# Patient Record
Sex: Female | Born: 1965
Health system: Southern US, Community
[De-identification: ages and names within clinical notes are randomized; demographics above are authoritative.]

## PROBLEM LIST (undated history)

## (undated) ENCOUNTER — Emergency Department (HOSPITAL_BASED_OUTPATIENT_CLINIC_OR_DEPARTMENT_OTHER): Admission: EM | Payer: Worker's Compensation | Source: Home / Self Care

## (undated) DIAGNOSIS — K5792 Diverticulitis of intestine, part unspecified, without perforation or abscess without bleeding: Secondary | ICD-10-CM

## (undated) DIAGNOSIS — E079 Disorder of thyroid, unspecified: Secondary | ICD-10-CM

## (undated) DIAGNOSIS — J189 Pneumonia, unspecified organism: Secondary | ICD-10-CM

## (undated) DIAGNOSIS — I10 Essential (primary) hypertension: Secondary | ICD-10-CM

## (undated) DIAGNOSIS — R7303 Prediabetes: Secondary | ICD-10-CM

## (undated) DIAGNOSIS — F419 Anxiety disorder, unspecified: Secondary | ICD-10-CM

## (undated) DIAGNOSIS — E039 Hypothyroidism, unspecified: Secondary | ICD-10-CM

## (undated) DIAGNOSIS — D649 Anemia, unspecified: Secondary | ICD-10-CM

## (undated) DIAGNOSIS — F1020 Alcohol dependence, uncomplicated: Secondary | ICD-10-CM

## (undated) DIAGNOSIS — B20 Human immunodeficiency virus [HIV] disease: Secondary | ICD-10-CM

## (undated) DIAGNOSIS — I219 Acute myocardial infarction, unspecified: Secondary | ICD-10-CM

## (undated) DIAGNOSIS — E785 Hyperlipidemia, unspecified: Secondary | ICD-10-CM

## (undated) DIAGNOSIS — T7840XA Allergy, unspecified, initial encounter: Secondary | ICD-10-CM

## (undated) DIAGNOSIS — E119 Type 2 diabetes mellitus without complications: Secondary | ICD-10-CM

## (undated) DIAGNOSIS — K5790 Diverticulosis of intestine, part unspecified, without perforation or abscess without bleeding: Secondary | ICD-10-CM

## (undated) DIAGNOSIS — K7010 Alcoholic hepatitis without ascites: Secondary | ICD-10-CM

## (undated) DIAGNOSIS — Z21 Asymptomatic human immunodeficiency virus [HIV] infection status: Secondary | ICD-10-CM

## (undated) DIAGNOSIS — K449 Diaphragmatic hernia without obstruction or gangrene: Secondary | ICD-10-CM

## (undated) DIAGNOSIS — G709 Myoneural disorder, unspecified: Secondary | ICD-10-CM

## (undated) DIAGNOSIS — Z8719 Personal history of other diseases of the digestive system: Secondary | ICD-10-CM

## (undated) DIAGNOSIS — R569 Unspecified convulsions: Secondary | ICD-10-CM

## (undated) DIAGNOSIS — K635 Polyp of colon: Secondary | ICD-10-CM

## (undated) DIAGNOSIS — I639 Cerebral infarction, unspecified: Secondary | ICD-10-CM

## (undated) DIAGNOSIS — F329 Major depressive disorder, single episode, unspecified: Secondary | ICD-10-CM

## (undated) DIAGNOSIS — B1920 Unspecified viral hepatitis C without hepatic coma: Secondary | ICD-10-CM

## (undated) DIAGNOSIS — F319 Bipolar disorder, unspecified: Secondary | ICD-10-CM

## (undated) DIAGNOSIS — M1990 Unspecified osteoarthritis, unspecified site: Secondary | ICD-10-CM

## (undated) DIAGNOSIS — F32A Depression, unspecified: Secondary | ICD-10-CM

## (undated) HISTORY — DX: Alcohol dependence, uncomplicated: F10.20

## (undated) HISTORY — DX: Type 2 diabetes mellitus without complications: E11.9

## (undated) HISTORY — DX: Anemia, unspecified: D64.9

## (undated) HISTORY — DX: Polyp of colon: K63.5

## (undated) HISTORY — DX: Allergy, unspecified, initial encounter: T78.40XA

## (undated) HISTORY — DX: Anxiety disorder, unspecified: F41.9

## (undated) HISTORY — DX: Diaphragmatic hernia without obstruction or gangrene: K44.9

## (undated) HISTORY — PX: ABDOMINAL HYSTERECTOMY: SHX81

## (undated) HISTORY — DX: Unspecified osteoarthritis, unspecified site: M19.90

## (undated) HISTORY — DX: Acute myocardial infarction, unspecified: I21.9

## (undated) HISTORY — DX: Hyperlipidemia, unspecified: E78.5

## (undated) HISTORY — DX: Pneumonia, unspecified organism: J18.9

## (undated) HISTORY — DX: Asymptomatic human immunodeficiency virus (hiv) infection status: Z21

## (undated) HISTORY — DX: Diverticulitis of intestine, part unspecified, without perforation or abscess without bleeding: K57.92

## (undated) HISTORY — PX: OTHER SURGICAL HISTORY: SHX169

## (undated) HISTORY — DX: Cerebral infarction, unspecified: I63.9

## (undated) HISTORY — DX: Diverticulosis of intestine, part unspecified, without perforation or abscess without bleeding: K57.90

## (undated) HISTORY — DX: Hypothyroidism, unspecified: E03.9

## (undated) HISTORY — DX: Human immunodeficiency virus (HIV) disease: B20

## (undated) HISTORY — PX: CHOLECYSTECTOMY: SHX55

---

## 2004-07-16 HISTORY — PX: TOTAL ABDOMINAL HYSTERECTOMY W/ BILATERAL SALPINGOOPHORECTOMY: SHX83

## 2008-01-02 ENCOUNTER — Emergency Department (HOSPITAL_COMMUNITY): Admission: EM | Admit: 2008-01-02 | Discharge: 2008-01-02 | Payer: Self-pay | Admitting: Emergency Medicine

## 2008-05-01 ENCOUNTER — Emergency Department (HOSPITAL_COMMUNITY): Admission: EM | Admit: 2008-05-01 | Discharge: 2008-05-02 | Payer: Self-pay | Admitting: Emergency Medicine

## 2008-12-19 ENCOUNTER — Emergency Department (HOSPITAL_COMMUNITY): Admission: EM | Admit: 2008-12-19 | Discharge: 2008-12-20 | Payer: Self-pay | Admitting: Emergency Medicine

## 2009-02-22 ENCOUNTER — Emergency Department (HOSPITAL_COMMUNITY): Admission: EM | Admit: 2009-02-22 | Discharge: 2009-02-22 | Payer: Self-pay | Admitting: Emergency Medicine

## 2009-05-08 ENCOUNTER — Emergency Department (HOSPITAL_COMMUNITY): Admission: EM | Admit: 2009-05-08 | Discharge: 2009-05-08 | Payer: Self-pay | Admitting: Emergency Medicine

## 2009-05-22 ENCOUNTER — Emergency Department (HOSPITAL_COMMUNITY): Admission: EM | Admit: 2009-05-22 | Discharge: 2009-05-22 | Payer: Self-pay | Admitting: Emergency Medicine

## 2009-08-19 ENCOUNTER — Telehealth: Payer: Self-pay | Admitting: Family Medicine

## 2009-08-19 ENCOUNTER — Ambulatory Visit: Payer: Self-pay | Admitting: Family Medicine

## 2009-08-19 DIAGNOSIS — M79605 Pain in left leg: Secondary | ICD-10-CM | POA: Insufficient documentation

## 2009-08-19 DIAGNOSIS — M79604 Pain in right leg: Secondary | ICD-10-CM | POA: Insufficient documentation

## 2009-08-19 DIAGNOSIS — I10 Essential (primary) hypertension: Secondary | ICD-10-CM | POA: Insufficient documentation

## 2009-08-19 DIAGNOSIS — M545 Low back pain: Secondary | ICD-10-CM

## 2009-09-12 ENCOUNTER — Ambulatory Visit: Payer: Self-pay | Admitting: Family Medicine

## 2009-09-12 ENCOUNTER — Encounter: Payer: Self-pay | Admitting: Family Medicine

## 2009-09-13 ENCOUNTER — Encounter: Payer: Self-pay | Admitting: Family Medicine

## 2009-09-13 ENCOUNTER — Observation Stay (HOSPITAL_COMMUNITY): Admission: EM | Admit: 2009-09-13 | Discharge: 2009-09-16 | Payer: Self-pay | Admitting: Emergency Medicine

## 2009-09-13 ENCOUNTER — Ambulatory Visit: Payer: Self-pay | Admitting: Family Medicine

## 2009-09-13 LAB — CONVERTED CEMR LAB
ALT: 73 units/L — ABNORMAL HIGH (ref 0–35)
AST: 95 units/L — ABNORMAL HIGH (ref 0–37)
Albumin: 3.7 g/dL (ref 3.5–5.2)
Alkaline Phosphatase: 70 units/L (ref 39–117)
Barbiturate Quant, Ur: NEGATIVE
Calcium: 9 mg/dL (ref 8.4–10.5)
Creatinine, Ser: 0.82 mg/dL (ref 0.40–1.20)
Creatinine,U: 106.3 mg/dL
Ethyl Alcohol: <10 mg/dL (ref <10)
HCT: 36.5 % (ref 36.0–46.0)
Methadone: NEGATIVE
Phenytoin Lvl: <0.5 ug/mL — ABNORMAL LOW (ref 10.0–20.0)
Propoxyphene: POSITIVE — AB
RBC: 3.88 M/uL (ref 3.87–5.11)
Sodium: 137 meq/L (ref 135–145)
Total Bilirubin: 0.6 mg/dL (ref 0.3–1.2)
Total Protein: 7.5 g/dL (ref 6.0–8.3)
Valproic Acid Lvl: <1 ug/mL — ABNORMAL LOW (ref 50.0–100.0)
WBC: 6.2 10*3/uL (ref 4.0–10.5)

## 2009-09-16 ENCOUNTER — Encounter: Payer: Self-pay | Admitting: *Deleted

## 2009-09-23 ENCOUNTER — Ambulatory Visit: Payer: Self-pay | Admitting: Family Medicine

## 2009-09-23 DIAGNOSIS — F1910 Other psychoactive substance abuse, uncomplicated: Secondary | ICD-10-CM | POA: Insufficient documentation

## 2009-09-23 DIAGNOSIS — F319 Bipolar disorder, unspecified: Secondary | ICD-10-CM | POA: Insufficient documentation

## 2009-09-27 ENCOUNTER — Telehealth: Payer: Self-pay | Admitting: Family Medicine

## 2009-10-05 ENCOUNTER — Telehealth: Payer: Self-pay | Admitting: Family Medicine

## 2009-10-07 ENCOUNTER — Telehealth: Payer: Self-pay | Admitting: Family Medicine

## 2010-02-09 ENCOUNTER — Emergency Department (HOSPITAL_BASED_OUTPATIENT_CLINIC_OR_DEPARTMENT_OTHER): Admission: EM | Admit: 2010-02-09 | Discharge: 2010-02-09 | Payer: Self-pay | Admitting: Emergency Medicine

## 2010-02-14 ENCOUNTER — Encounter: Payer: Self-pay | Admitting: Family Medicine

## 2010-02-14 ENCOUNTER — Telehealth: Payer: Self-pay | Admitting: Family Medicine

## 2010-02-14 ENCOUNTER — Ambulatory Visit: Payer: Self-pay | Admitting: Family Medicine

## 2010-02-14 LAB — CONVERTED CEMR LAB
BUN: 9 mg/dL (ref 6–23)
Creatinine, Ser: 0.84 mg/dL (ref 0.40–1.20)
Sodium: 141 meq/L (ref 135–145)

## 2010-02-15 ENCOUNTER — Emergency Department (HOSPITAL_COMMUNITY): Admission: EM | Admit: 2010-02-15 | Discharge: 2010-02-15 | Payer: Self-pay | Admitting: Emergency Medicine

## 2010-02-15 ENCOUNTER — Encounter: Payer: Self-pay | Admitting: Family Medicine

## 2010-03-10 ENCOUNTER — Encounter: Payer: Self-pay | Admitting: Family Medicine

## 2010-03-10 ENCOUNTER — Ambulatory Visit: Payer: Self-pay | Admitting: Family Medicine

## 2010-03-10 LAB — CONVERTED CEMR LAB: Whiff Test: POSITIVE

## 2010-03-13 ENCOUNTER — Telehealth: Payer: Self-pay | Admitting: Family Medicine

## 2010-03-16 ENCOUNTER — Encounter: Payer: Self-pay | Admitting: Family Medicine

## 2010-03-16 DIAGNOSIS — Z21 Asymptomatic human immunodeficiency virus [HIV] infection status: Secondary | ICD-10-CM | POA: Insufficient documentation

## 2010-03-16 LAB — CONVERTED CEMR LAB
HIV-2 Ab: NEGATIVE
HIV: REACTIVE
Valproic Acid Lvl: 42.5 ug/mL — ABNORMAL LOW (ref 50.0–100.0)

## 2010-03-17 ENCOUNTER — Ambulatory Visit: Payer: Self-pay | Admitting: Family Medicine

## 2010-03-22 ENCOUNTER — Encounter: Payer: Self-pay | Admitting: *Deleted

## 2010-03-28 ENCOUNTER — Ambulatory Visit: Payer: Self-pay | Admitting: Internal Medicine

## 2010-03-28 LAB — CONVERTED CEMR LAB
Albumin: 4 g/dL (ref 3.5–5.2)
Alkaline Phosphatase: 57 units/L (ref 39–117)
BUN: 16 mg/dL (ref 6–23)
Bilirubin Urine: NEGATIVE
Calcium: 9.1 mg/dL (ref 8.4–10.5)
Chloride: 103 meq/L (ref 96–112)
HCT: 33.5 % — ABNORMAL LOW (ref 36.0–46.0)
HIV 1 RNA Quant: 5500 copies/mL — ABNORMAL HIGH (ref <20)
HIV-1 RNA Quant, Log: 3.74 — ABNORMAL HIGH (ref <1.30)
Hemoglobin: 11 g/dL — ABNORMAL LOW (ref 12.0–15.0)
Hep A Total Ab: NEGATIVE
Hep B Core Total Ab: NEGATIVE
Hep B S Ab: NEGATIVE
Ketones, ur: NEGATIVE mg/dL
LDL Cholesterol: 74 mg/dL (ref 0–99)
Leukocytes, UA: NEGATIVE
Lymphs Abs: 2.9 10*3/uL (ref 0.7–4.0)
MCHC: 32.8 g/dL (ref 30.0–36.0)
MCV: 94.9 fL (ref 78.0–100.0)
Monocytes Absolute: 0.6 10*3/uL (ref 0.1–1.0)
Monocytes Relative: 11 % (ref 3–12)
Neutrophils Relative %: 37 % — ABNORMAL LOW (ref 43–77)
Protein, ur: NEGATIVE mg/dL
RBC: 3.53 M/uL — ABNORMAL LOW (ref 3.87–5.11)
Sodium: 138 meq/L (ref 135–145)
Total Bilirubin: 0.3 mg/dL (ref 0.3–1.2)
Total CHOL/HDL Ratio: 5.7
Urine Glucose: NEGATIVE mg/dL
WBC: 5.9 10*3/uL (ref 4.0–10.5)

## 2010-03-30 ENCOUNTER — Encounter (INDEPENDENT_AMBULATORY_CARE_PROVIDER_SITE_OTHER): Payer: Self-pay | Admitting: *Deleted

## 2010-04-04 ENCOUNTER — Emergency Department (HOSPITAL_COMMUNITY): Admission: EM | Admit: 2010-04-04 | Discharge: 2010-04-05 | Payer: Self-pay | Admitting: Emergency Medicine

## 2010-04-20 ENCOUNTER — Ambulatory Visit: Payer: Self-pay | Admitting: Internal Medicine

## 2010-04-20 DIAGNOSIS — M79609 Pain in unspecified limb: Secondary | ICD-10-CM | POA: Insufficient documentation

## 2010-04-28 ENCOUNTER — Telehealth (INDEPENDENT_AMBULATORY_CARE_PROVIDER_SITE_OTHER): Payer: Self-pay | Admitting: *Deleted

## 2010-05-11 ENCOUNTER — Emergency Department (HOSPITAL_COMMUNITY): Admission: EM | Admit: 2010-05-11 | Discharge: 2010-05-11 | Payer: Self-pay | Admitting: Emergency Medicine

## 2010-06-27 ENCOUNTER — Ambulatory Visit: Payer: Self-pay | Admitting: Internal Medicine

## 2010-07-25 ENCOUNTER — Encounter (INDEPENDENT_AMBULATORY_CARE_PROVIDER_SITE_OTHER): Payer: Self-pay | Admitting: *Deleted

## 2010-08-06 ENCOUNTER — Encounter: Payer: Self-pay | Admitting: Family Medicine

## 2010-08-10 ENCOUNTER — Ambulatory Visit: Admit: 2010-08-10 | Payer: Self-pay | Admitting: Internal Medicine

## 2010-08-15 ENCOUNTER — Inpatient Hospital Stay (HOSPITAL_COMMUNITY)
Admission: AD | Admit: 2010-08-15 | Discharge: 2010-08-18 | DRG: 885 | Disposition: A | Discharge status: Home / Self Care | Payer: Self-pay | Attending: Psychiatry | Admitting: Psychiatry

## 2010-08-15 ENCOUNTER — Emergency Department (HOSPITAL_COMMUNITY)
Admission: EM | Admit: 2010-08-15 | Discharge: 2010-08-15 | Disposition: A | Discharge status: Other Acute Inpatient Hospital | Payer: Self-pay | Source: Home / Self Care | Admitting: Emergency Medicine

## 2010-08-15 DIAGNOSIS — F1210 Cannabis abuse, uncomplicated: Secondary | ICD-10-CM

## 2010-08-15 DIAGNOSIS — I10 Essential (primary) hypertension: Secondary | ICD-10-CM

## 2010-08-15 DIAGNOSIS — F4310 Post-traumatic stress disorder, unspecified: Secondary | ICD-10-CM

## 2010-08-15 DIAGNOSIS — G40909 Epilepsy, unspecified, not intractable, without status epilepticus: Secondary | ICD-10-CM

## 2010-08-15 DIAGNOSIS — R45851 Suicidal ideations: Secondary | ICD-10-CM

## 2010-08-15 DIAGNOSIS — G43909 Migraine, unspecified, not intractable, without status migrainosus: Secondary | ICD-10-CM

## 2010-08-15 DIAGNOSIS — Z634 Disappearance and death of family member: Secondary | ICD-10-CM

## 2010-08-15 DIAGNOSIS — F332 Major depressive disorder, recurrent severe without psychotic features: Principal | ICD-10-CM

## 2010-08-15 DIAGNOSIS — Z21 Asymptomatic human immunodeficiency virus [HIV] infection status: Secondary | ICD-10-CM

## 2010-08-15 LAB — CBC
HCT: 40.1 % (ref 36.0–46.0)
RBC: 4.32 MIL/uL (ref 3.87–5.11)

## 2010-08-15 LAB — COMPREHENSIVE METABOLIC PANEL
Alkaline Phosphatase: 71 U/L (ref 39–117)
BUN: 6 mg/dL (ref 6–23)
CO2: 25 mEq/L (ref 19–32)
GFR calc Af Amer: >60 mL/min (ref >60)
Glucose, Bld: 84 mg/dL (ref 70–99)
Sodium: 141 mEq/L (ref 135–145)
Total Bilirubin: 0.2 mg/dL — ABNORMAL LOW (ref 0.3–1.2)
Total Protein: 8 g/dL (ref 6.0–8.3)

## 2010-08-15 LAB — RAPID URINE DRUG SCREEN, HOSP PERFORMED
Amphetamines: NOT DETECTED
Benzodiazepines: NOT DETECTED
Cocaine: POSITIVE — AB
Opiates: NOT DETECTED
Tetrahydrocannabinol: POSITIVE — AB

## 2010-08-15 LAB — URINALYSIS, ROUTINE W REFLEX MICROSCOPIC
Nitrite: NEGATIVE
Specific Gravity, Urine: 1.018 (ref 1.005–1.030)
Urobilinogen, UA: 1 mg/dL (ref 0.0–1.0)

## 2010-08-15 LAB — URINE MICROSCOPIC-ADD ON

## 2010-08-15 LAB — DIFFERENTIAL
Eosinophils Absolute: 0.2 10*3/uL (ref 0.0–0.7)
Eosinophils Relative: 2 % (ref 0–5)
Lymphocytes Relative: 46 % (ref 12–46)
Lymphs Abs: 4.6 10*3/uL — ABNORMAL HIGH (ref 0.7–4.0)
Monocytes Absolute: 0.7 10*3/uL (ref 0.1–1.0)
Monocytes Relative: 7 % (ref 3–12)
Neutrophils Relative %: 45 % (ref 43–77)

## 2010-08-15 LAB — VALPROIC ACID LEVEL: Valproic Acid Lvl: <10 ug/mL — ABNORMAL LOW (ref 50.0–100.0)

## 2010-08-15 NOTE — Progress Notes (Signed)
Summary: triage  Phone Note Call from Patient Call back at Home Phone [phone]   Caller: Patient Summary of Call: Just saw Cheryl Thompson and wanted her to know that her bp when she got home 165/102.  Wants to know what it means if the bottom one is risizng. Initial call taken by: Clydell Hakim,  August 19, 2009 10:44 AM  Follow-up for Phone Call        explained bps vary . could take it every 5 minutes & get different numbers each time. her sister has gone to the store to get the bp meds for her. urged low salt diet & discussed where salt is found in processed foods. to call back at any time. told her to bring her bp cuff here at next visit so we can see if they are getting the same readings Follow-up by: [name] RN,  August 19, 2009 10:46 AM

## 2010-08-15 NOTE — Progress Notes (Signed)
Summary: referral for detox  Phone Note Call from Patient Call back at Home Phone [phone]   Caller: Patient Summary of Call: wants a referral to go a detox place Initial call taken by: De Nurse,  October 05, 2009 8:42 AM  Follow-up for Phone Call        Still drinking, took a bunch of ibuprofen last night with 32 ox beer and was upset when she woke up this morning.  Instructed to call the crisis line at [facility], to seek immediate placement.  Admits to drinking and smoking pot daily. Follow-up by: [name] NP,  October 05, 2009 12:41 PM

## 2010-08-15 NOTE — Progress Notes (Signed)
Summary: phn msg/FYI/TS  Phone Note Call from Patient Call back at Home Phone [phone]   Caller: Patient Summary of Call: Pt has not drank or smoked pot in 4 days and taking meds, also going to meeting. Initial call taken by: Clydell Hakim,  October 07, 2009 10:48 AM  Follow-up for Phone Call        FWD. TO S.Ahlayah Tarkowski  Follow-up by: Arlyss Repress CMA,,  October 07, 2009 11:14 AM
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
## 2010-08-15 NOTE — Progress Notes (Signed)
Summary: Rx Prob  Phone Note Call from Patient Call back at [facility] Phone [phone]   Caller: Patient Summary of Call: The rx that [name] gave her on Friday she took to [facility] and thought it was on the 4 dollar plan, but it was not.  She needs a new one called into the health dept.   Initial call taken by: Clydell Hakim,  September 27, 2009 4:22 PM  Follow-up for Phone Call        will forward to [name]. Follow-up by: Theresia Lo RN,  September 27, 2009 4:25 PM    Prescriptions: SERTRALINE HCL 25 MG TABS (SERTRALINE HCL) one daily Brand medically necessary #30 x 6   Entered and Authorized by:   [name] NP   Signed by:   [name] NP on 09/27/2009   Method used:   Faxed to ...       [facility] (retail)       [address] [place], [place]  [address]       Ph: [phone]       Fax: [phone]   RxID:   [id number]

## 2010-08-15 NOTE — Miscellaneous (Signed)
Summary: PT IN HOSPITAL/TS  Clinical Lists Changes
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
## 2010-08-15 NOTE — Progress Notes (Signed)
Summary: Depokote levels  Phone Note Call from Patient   Reason for Call: Talk to Nurse Summary of Call: pt would like her depokote levels to be called into the facility she is staying at, [phone] Initial call taken by: Knox Royalty,  February 14, 2010 11:17 AM  Follow-up for Phone Call        will call tomorrow, after test results. Follow-up by: Arlyss Repress CMA,,  February 14, 2010 11:20 AM  Additional Follow-up for Phone Call Additional follow up Details #1::        depokote level low...fwd. to s.Annaleese Guier Additional Follow-up by: Arlyss Repress CMA,,  February 15, 2010 9:18 AM

## 2010-08-15 NOTE — Miscellaneous (Signed)
Summary: call from state Health Dept  Clinical Lists Changes Cheryl Thompson with [facility] called to make sure pt knew her diagnosis of HIV & had been referred to ID & to confirm her address & phone. states he wil be contacting her to trace where she got the infection. he asked other questions but I told him he will need to speak with pt. Marland KitchenGolden Circle RN  March 22, 2010 11:47 AM

## 2010-08-15 NOTE — Miscellaneous (Signed)
Summary: HIPAA Restrictions  HIPAA Restrictions   Imported By: Florinda Marker 03/28/2010 15:48:57  _____________________________________________________________________  External Attachment:    Type:   Image     Comment:   External Document

## 2010-08-15 NOTE — Miscellaneous (Signed)
Summary: Bridge counselor referral   Clinical Lists Changes  Orders: Added new Referral order of Misc. Referral (Misc. Ref) - Signed

## 2010-08-15 NOTE — Assessment & Plan Note (Signed)
Summary: abd pain/depakote level ck,df   Vital Signs:  Patient profile:   45 year old female Height:      67 inches Weight:      168 pounds BMI:     26.41 Temp:     98.3 degrees F oral BP sitting:   128 / 80  (left arm) Cuff size:   regular  Vitals Entered By: Tessie Fass CMA (March 10, 2010 9:09 AM) Is Patient Diabetic? No Pain Assessment Patient in pain? yes        History of Present Illness: Cheryl Thompson has been sober for 40 days, she is living in a transitional house for women.  She is living day to day.  She is having a deep ache type pain in her legs and body, especially her back.  She has knee pain when she exercises.  She has been spotting vaginally and has had a TAH wtih BSO, so beleives that she has an STD.  She denies seizures, increse in Depakote caused sedation, seen in ER.  Dosage reduced to 250 two times a day, she is here for a level today.  Seizures had been in the past related to alcohol withdrawal. She has been taking her BP meds.    Current Medications (verified): 1)  Depakote 250 Mg Tbec (Divalproex Sodium) .... Two Times A Day (Level At 29.1 On This Dose) 2)  Lisinopril-Hydrochlorothiazide 20-12.5 Mg Tabs (Lisinopril-Hydrochlorothiazide) .... One Daily 3)  Amlodipine Besylate 5 Mg Tabs (Amlodipine Besylate) .... One Daily 4)  Amitriptyline Hcl 50 Mg Tabs (Amitriptyline Hcl) .... Use One To One and 1/2 To 2 At Bedtime 5)  Metronidazole 500 Mg Tabs (Metronidazole) .... 4 Tabs Times One  Allergies (verified): 1)  ! Penicillin 2)  ! Naprosyn  Review of Systems      See HPI General:  Denies fever, loss of appetite, sleep disorder, sweats, and weakness. MS:  Complains of joint pain and low back pain. Psych:  Complains of depression; denies anxiety, panic attacks, and suicidal thoughts/plans.  Physical Exam  General:  Well-developed,well-nourished,in no acute distress; alert,appropriate and cooperative throughout examination Msk:  normal ROM, no joint  tenderness, no joint swelling, and no joint warmth.     Detailed Back/Spine Exam  Lumbosacral Exam:  Inspection-deformity:    Normal Palpation-spinal tenderness:  Normal Range of Motion:    Forward Flexion:   60 degrees    Hyperextension:   25 degrees    Schober's:        >6 cm    Right Lateral Bend:   25 degrees    Left Lateral Bend:   25 degrees Squatting:  normal Lying Straight Leg Raise:    Right:  negative    Left:  negative Sitting Straight Leg Raise:    Right:  negative    Left:  negative Reverse Straight Leg Raise:    Right:  negative    Left:  negative Contralateral Straight Leg Raise:    Right:  negative    Left:  negative Sciatic Notch:    There is no sciatic notch tenderness. 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 Orders: [facility]- Est  Level 4 (96045)  Problem # 2:  BIPOLAR AFFECTIVE DISORDER (ICD-296.80) Doing well, Depoke likely treating.  Will monitor for changes.  She is in outpatient treatment so getting a lot of support at this time. Orders: Valproic Acid-[facility] (40981-19147) [facility]- Est  Level 4 (82956)  Problem # 3:  SUBSTANCE ABUSE, MULTIPLE (ICD-305.90) 40 days sober, in transitional housing Orders: [facility]- Est  Level 4 (99214)  Problem # 4:  BACK PAIN, LUMBAR (ICD-724.2) chronic, add amitriptyline qhs  Problem # 5:  SEIZURE DISORDER (ICD-780.39) Past related to alcohol withdrawal, seizure free Her updated medication list for this problem includes:    Depakote 250 Mg Tbec (Divalproex sodium) .Marland Kitchen..Marland Kitchen Two times a day (level at 29.1 on this dose)  Problem # 6:  CONTACT OR  EXPOSURE TO OTHER VIRAL DISEASES (ICD-V01.79)  Orders: GC/Chlamydia-[facility] (87591/87491) HIV-[facility] (21308-65784) RPR-[facility] (69629-52841)  Complete Medication List: 1)  Depakote 250 Mg Tbec (Divalproex sodium) .... Two times a day (level at 29.1 on this dose) 2)  Lisinopril-hydrochlorothiazide 20-12.5 Mg Tabs (Lisinopril-hydrochlorothiazide) .... One daily 3)  Amlodipine Besylate 5 Mg Tabs (Amlodipine besylate) .... One daily 4)  Amitriptyline Hcl 50 Mg Tabs (Amitriptyline hcl) .... Use one to one and 1/2 to 2 at bedtime 5)  Metronidazole 500 Mg Tabs (Metronidazole) .... 4 tabs times one  [facility] ([id number][facility]  Patient Instructions: 1)  Keep up the good work 2)  Be mindful of the different types of pain you experience  3)  Begin to exercise your legs to develop strength in your legs 4)  Return in one month is pain is not improved 5)  Otherwise return in October for a flu shot 6)  I will call you next week with your results. Prescriptions: METRONIDAZOLE 500 MG TABS (METRONIDAZOLE) 4 tabs times one Brand medically necessary #4 x 1   Entered and Authorized by:   [name] NP   Signed by:   [name] NP on 03/10/2010   Method used:   Print then Give to Patient   RxID:   [id number] AMITRIPTYLINE HCL 50 MG TABS (AMITRIPTYLINE HCL) use one to one and 1/2 to 2 at bedtime Brand medically necessary #60 x 6   Entered and Authorized by:   [name] NP   Signed by:   [name] NP on 03/10/2010   Method used:   Print then Give to Patient   RxID:   [id number] DEPAKOTE 250 MG TBEC (DIVALPROEX SODIUM) two times a day (level at 29.1 on this dose) Brand medically necessary #60 x 11   Entered and Authorized by:   [name] NP   Signed by:   [name] NP on 03/10/2010   Method used:   Print then Give to Patient   RxID:   [id number] AMLODIPINE BESYLATE 5 MG TABS (AMLODIPINE BESYLATE) one daily Brand medically necessary #30 x 11   Entered and Authorized by:    [name] NP   Signed by:   [name] NP on 03/10/2010   Method used:   Print then Give to Patient   RxID:   [id number] LISINOPRIL-HYDROCHLOROTHIAZIDE 20-12.5 MG TABS (LISINOPRIL-HYDROCHLOROTHIAZIDE) one daily Brand medically necessary #30 x 11   Entered and Authorized by:   [name] NP   Signed by:   [name] NP on 03/10/2010   Method used:   Print then Give to  Patient   RxID:   [id number]   Laboratory Results  Date/Time Received: March 10, 2010 9:40 AM  Date/Time Reported: March 10, 2010 9:45 AM   Wet [place] Source: vag WBC/hpf: 10-20 Bacteria/hpf: 3+  Cocci Clue cells/hpf: few  Positive whiff Yeast/hpf: none Trichomonas/hpf: many Comments: mod RBC's ...............test performed by......Marland KitchenBonnie A. Swaziland, MLS (ASCP)cm

## 2010-08-15 NOTE — Miscellaneous (Signed)
Summary: clinical update/[facility] ncadap app completed  Clinical Lists Changes  Observations: Added new observation of INFECTDIS MD: Philipp Deputy (03/30/2010 9:08) Added new observation of RWTITLE: D (03/30/2010 9:08) Added new observation of PAYOR: No Insurance (03/30/2010 9:08) Added new observation of AIDSDAP: Pending (03/30/2010 9:08) Added new observation of PCTFPL: 63.42  (03/30/2010 9:08) Added new observation of INCOMESOURCE: other  (03/30/2010 9:08) Added new observation of HOUSEINCOME: 6868  (03/30/2010 9:08) Added new observation of #CHILD<18 IN: No  (03/30/2010 9:08) Added new observation of FAMILYSIZE: 1  (03/30/2010 9:08) Added new observation of HOUSING: Stable/permanent  (03/30/2010 9:08) Added new observation of FINASSESSDT: 03/28/2010  (03/30/2010 9:08) Added new observation of GENDER: Female  (03/30/2010 9:08) Added new observation of MARITAL STAT: separated  (03/30/2010 9:08) Added new observation of LATINO/HISP: No  (03/30/2010 9:08) Added new observation of RACE: African American  (03/30/2010 9:08) Added new observation of REC_MESSAGE: Yes  (03/30/2010 9:08) Added new observation of RECPHONECALL: Yes  (03/30/2010 9:08) Added new observation of REC_MAIL: Yes  (03/30/2010 9:08) Added new observation of RW VITAL STA: Active  (03/30/2010 9:08) Added new observation of PATNTCOUNTY: [place]  (03/30/2010 9:08) Added new observation of RWPARTICIP: Yes  (03/30/2010 9:08)
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
## 2010-08-15 NOTE — Assessment & Plan Note (Signed)
Summary: new 042 intake/kam              Prevention For Positives: 03/30/2010   Safe sex practices discussed with patient. Condoms offered.        03/28/2010   Patient was screened for substance abuse and depression. Referal was made as indicated.                      Infectious Disease New Patient Intake Referring MD/Agency: Rosalio Macadamia , NP Address: [facility] [address] [place], [place] [address]  Return Appointment Date: 04/12/2010  With Physician: Philipp Deputy Medical Records: Received Health Insurance / Payor: No Insurance Employer: None      Do you have a Primary physician: Yes Physician Name: Rosalio Macadamia, NP   City/State: [place], [place] Dates of Service From:   8-11 Are family members aware of patient's diagnosis?  If so, are they supportive? none Describe patient's current social support (family, friends, support groups): boyfriend, supportive Tobacco use: current Amt: 4 per day.  Behavioral Health Assessment Have you ever been diagnosed with depression or mental illness? Yes  Diagnosis: Bipolar Disorder  depression Do you drink alcohol? No Alcohol Beverage Type(s): past history of abuse Do you use recreational drugs? No Drugs: past history of abuse, Currently sober for  60 days  Sober for  13 years prior to recent relapse  Type of Use: crack, cocaine,  marijuana  Do you feel you have a problem with drugs and/or alcohol? No   Describe: Not currently using.. Concerned about staying sober and not taking needed medications from fear of becoming addictive.  Facility Name 1: [facility]   City/State: [place], [place]  Behavioral Health Comments: Pt was referred to [facility] of the [employer] , Loss adjuster, chartered for assistance with multiple problems.  She has a big fear of relapse .  HIV Intake Information When did you first test positive for HIV? 02/14/2010 Type of test Conducted: WB   Where was this test performed?  Name of Agency:  [facility]   City/State: [place], [place]  [place]  Risk Factor(s) for HIV: Heterosexual contact  Method of Exposure to HIV: Bisexual Other: Pt has a history of drug abuse and states she was sexually active without protection . Many partners  unknown  males.  No anal sex  there is  some history of same sex relationships. Have you ever been hospitalized for any HIV-related condition? No  Have you ever been under the care of a physician for being HIV positive? No  Newly Diagnosed Patients Has a Disease Intervention Specialist from the Health Department contacted the patient? No.   The patient has been informed that the [facility] Department will contact ALL newly reported cases. Health Department Contact:  [phone]   (SSN is needed for confirmation)  Health Department Contact:  [phone]            (SSN is needed for confirmation)  Do you have any Non-HIV related medical conditions or other prior hospitalizations or surgeries? No  HIV Medications Information The patient is currently NOT taking any HIV medications.  Infection History  Patient has been diagnosed with the following opportunistic infections: Are there any other symptoms you need to discuss? No Have you received literature/education prior to this visit about HIV/AIDS? No Do you understand the meaning of a Viral Load? No Do you understand the meaning of a CD4 count? No Lab Values Education/Handout Given  Yes Medication Education/Handout Given Yes  Sexual History Are you in a current relationship? Yes Are they aware of your diagnosis? Yes Have they been tested for HIV? No Details: partner is awaiting test results Are you currently sexually active? Yes Was this protected intercourse? No Safe Sex Counseling/Pamphlet Given Sexual History Comments: Last one year only 1 partner  lifetime: 50 partners but may be more due to periods of drug abuse.   Evaluation and Follow-Up INTAKE CHECK LIST: HIV  Education, Safe Sex Counseling, Case Management Referral, HIV Material Given, [employer] Consent, Sliding Fee Scale  Prevention For Positives: 03/30/2010   Safe sex practices discussed with patient. Condoms offered. Juanell Fairly Consent: Yes Are you in need of condoms at this time? Yes Our patient has been informed that condoms are always available in this clinic.   Are you involved in any social organization? Other Name of Agency: [facility] [place] [facility] Comments: Pt is  currently living in a recovery house.  [facility]. She feel comfortable there and thinks this is helping with her sobriety.   Obstetric/Gynecological History   Last Menstrual Period: 2008 Hysterectomy:  Yes  Pregnancy History Gravida: 1 Para: 0   Immunizations Administered:  PPD Skin Test:    Vaccine Type: PPD    Site: left forearm    Mfr: Sanofi Pasteur    Dose: 0.05 ml    Route: ID    Given by: Tomasita Morrow RN    Exp. Date: 05/18/2011    Lot #: [id number]  Pneumonia Vaccine:    Vaccine Type: Pneumovax    Site: right deltoid    Mfr: Merck    Dose: 0.5 ml    Route: IM    Given by: Tomasita Morrow RN    Exp. Date: 09/30/2011    Lot #: [id number]    VIS given: 06/20/09 version given March 28, 2010.  Influenza Vaccine # 1:    Vaccine Type: Fluvax 3+    Site: left deltoid    Mfr: fluvirin    Dose: 0.1 ml    Route: IM    Given by: Tomasita Morrow RN    Exp. Date: 10/15/2010    Lot #: [id number]    VIS given: 02/07/10 version given March 28, 2010.

## 2010-08-15 NOTE — Miscellaneous (Signed)
  Clinical Lists Changes  Problems: Added new problem of HIV INFECTION (ICD-042) - Signed Orders: Added new Referral order of Infectious Disease Referral (ID) - Signed She is not Rudell Cobb certified and will need to be so that she can be seen at ID clinic; expired in August. Patient does not know, I have left messages for her to call back at her Mother's home. [name] NP  March 16, 2010 8:52 AM  I have called all numbers available and she is not at any of these numbers. [name] NP  March 16, 2010 1:47 PM  Patient seen today, informed of HIV status, has apt with ID 03/28/10 [name] NP  March 17, 2010 10:08 AM

## 2010-08-15 NOTE — Progress Notes (Signed)
Summary: test results  Phone Note Call from Patient Call back at [phone] or [phone]   Caller: Patient Summary of Call: pt is wanting to know results of test Initial call taken by: De Nurse,  March 13, 2010 3:32 PM  Follow-up for Phone Call        awaiting confirmatin test for HIV status; called first number and she is not there, second number mailbox is full.  She will need to come in if postitive. Follow-up by: [name] NP,  March 14, 2010 5:05 PM  Additional Follow-up for Phone Call Additional follow up Details #1::        called and awaiting confirmation test. Additional Follow-up by: [name] NP,  March 15, 2010 4:20 PM

## 2010-08-15 NOTE — Letter (Signed)
Summary: Cheryl Thompson: Income Verification  Cheryl Thompson: Income Verification   Imported By: Florinda Marker 04/06/2010 15:52:40  _____________________________________________________________________  External Attachment:    Type:   Image     Comment:   External Document

## 2010-08-15 NOTE — Assessment & Plan Note (Signed)
Summary: f/u,df   Vital Signs:  Patient profile:   45 year old female Height:      67 inches Weight:      163.9 pounds Pulse rate:   60 / minute BP sitting:   176 / 100  (left arm) Cuff size:   regular  Vitals Entered By: Renato Battles slade,cma CC: right knee and hip pain started yesterday after playing volley ball. Is Patient Diabetic? No Pain Assessment Patient in pain? yes     Location: righ hip/knee Intensity: 8 Onset of pain  x 2 days   CC:  right knee and hip pain started yesterday after playing volley ball.Marland Kitchen  History of Present Illness: Cheryl Thompson is currently in alcohol and drug impatient treatment at [facility].  She will be discharged 8/19 and will live in [facility] (half way house).  She is very positive.  During alcohol withdrawal she said she did not suffer, she was treated carefully.  She has a Conservation officer, nature and part of a group.  She will become part of a community AA group before discharge.  She has been very physically active and when playing volley ball last evening she twisted her knee.  She had pain and swelling.  She had past surgery on this knee.  She does not want to quit exercising so will walk on the treadmill and do leg lifts.  BP has been running very high, she was not taking meds until last week.  Visit to ER who reinforced need to take BP meds.  She has been taking valproic acid 500 mg daily. (discharged from hospital after alcohol withdrawal seizures on two times a day)  Plans to quit smoking after this pack.  Habits & Providers  Alcohol-Tobacco-Diet     Tobacco Status: current     Tobacco Counseling: to quit use of tobacco products  Comments: PT HAS 8 CIGARETTES LEFT AND WILL STOP SMOKING AFTER THAT  Current Medications (verified): 1)  Depakote 500 Mg Tbec (Divalproex Sodium) .... Two Times A Day 2)  Lisinopril-Hydrochlorothiazide 20-12.5 Mg Tabs (Lisinopril-Hydrochlorothiazide) .... One Daily 3)  Amlodipine Besylate 5 Mg Tabs (Amlodipine  Besylate) .... One Daily  Allergies (verified): 1)  ! Penicillin 2)  ! Naprosyn  Review of Systems      See HPI  Physical Exam  General:  Looked well today. Lungs:  normal respiratory effort and normal breath sounds.   Heart:  normal rate and regular rhythm.   Msk:  right knee with effusion, diffuse tenderness, good stability.  extension to 160 degrees   Impression & Recommendations:  Problem # 1:  ESSENTIAL HYPERTENSION, BENIGN (ICD-401.1) Add amlodipine to ACE/thiazide, recheck BP ongoing, if remains above 130/80 to return sooner. Her updated medication list for this problem includes:    Lisinopril-hydrochlorothiazide 20-12.5 Mg Tabs (Lisinopril-hydrochlorothiazide) ..... One daily    Amlodipine Besylate 5 Mg Tabs (Amlodipine besylate) ..... One daily  Orders: Basic [facility] [id number]) [facility]- Est  Level 4 (09811)  Problem # 2:  SUBSTANCE ABUSE, MULTIPLE (ICD-305.90) Inpatient treatment at this time Orders: [facility]- Est  Level 4 (91478)  Problem # 3:  SEIZURE DISORDER (ICD-780.39) check level, no seizures reported Her updated medication list for this problem includes:    Depakote 500 Mg Tbec (Divalproex sodium) .Marland Kitchen..Marland Kitchen Two times a day  Orders: Valproic Acid-[facility] ([id number]) [facility]- Est  Level 4 (99214)  Problem # 4:  SPRAIN&STRAIN OTHER SPECIFIED SITES KNEE&LEG (ICD-844.8) RICE for one week, if no improvement follow up in SM clinic.  Complete Medication  List: 1)  Depakote 500 Mg Tbec (Divalproex sodium) .... Two times a day 2)  Lisinopril-hydrochlorothiazide 20-12.5 Mg Tabs (Lisinopril-hydrochlorothiazide) .... One daily 3)  Amlodipine Besylate 5 Mg Tabs (Amlodipine besylate) .... One daily  Other Orders: Tdap => 27yrs IM ([address]) Admin 1st Vaccine ([address])  Patient Instructions: 1)  If you are still having swelling and pain in once week, apt with Dr. Denny Levy at our [facility]  2)  Please provider Ms Vuolo with their direct number 3)  Ice 3-4 times a  day 4)  2 month apt for PAP Prescriptions: AMLODIPINE BESYLATE 5 MG TABS (AMLODIPINE BESYLATE) one daily Brand medically necessary #30 x 2   Entered and Authorized by:   [name] NP   Signed by:   [name] NP on 02/14/2010   Method used:   Print then Give to Patient   RxID:   [id number] LISINOPRIL-HYDROCHLOROTHIAZIDE 20-12.5 MG TABS (LISINOPRIL-HYDROCHLOROTHIAZIDE) one daily Brand medically necessary #30 x 2   Entered and Authorized by:   [name] NP   Signed by:   [name] NP on 02/14/2010   Method used:   Print then Give to Patient   RxID:   [id number] DEPAKOTE 500 MG TBEC (DIVALPROEX SODIUM) two times a day Brand medically necessary #30 x 2   Entered and Authorized by:   [name] NP   Signed by:   [name] NP on 02/14/2010   Method used:   Print then Give to Patient   RxID:   [id number]    Prevention & Chronic Care Immunizations   Influenza vaccine: Not documented    Tetanus booster: 02/14/2010: Tdap    Pneumococcal vaccine: Not documented  Other Screening   Pap smear: Not documented    Mammogram: Not documented   Smoking status: current  (02/14/2010)  Lipids   Total Cholesterol: Not documented   LDL: Not documented   LDL Direct: Not documented   HDL: Not documented   Triglycerides: Not documented  Hypertension   Last Blood Pressure: 176 / 100  (02/14/2010)   Serum creatinine: 0.82  (09/12/2009)   Serum potassium 3.5  (09/12/2009)    Hypertension flowsheet reviewed?: Yes   Progress toward BP goal: Unchanged  Self-Management Support :   Personal Goals (by the next clinic visit) :      Personal blood pressure goal: 130/80  (02/14/2010)   Hypertension self-management support: Not documented   Nursing Instructions: Give tetanus booster today    Immunizations Administered:  Tetanus Vaccine:    Vaccine Type: Tdap    Site: left deltoid    Mfr: GlaxoSmithKline    Dose: 0.5 ml    Route: IM    Given by: Jone Baseman CMA    Exp. Date: 01/13/2012    Lot #: [id number]    VIS given: 06/03/07 version given February 14, 2010.

## 2010-08-15 NOTE — Progress Notes (Signed)
Summary: PPD - negative    PPD Results    Date of reading: 03/30/2010    Results: < 5mm    Interpretation: negative
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
## 2010-08-15 NOTE — Miscellaneous (Signed)
Summary: slurred speech  Clinical Lists Changes Cheryl Thompson, from Cheryl Thompson [phone])  called to report pt was dizzy & had slurred speech this am. she put her to bed to rest. bp was 159/118. states she almost passed out. told her she needs to go to ED now.  also said she has been taking her depakote 250 two times a Cheryl & now she is on 500 two times a Cheryl. Cheryl Thompson feels this is too much!  to pcp.[name] RN  February 15, 2010 11:46 AM  Medications: Changed medication from DEPAKOTE 500 MG TBEC (DIVALPROEX SODIUM) two times a Cheryl [BMN] to DEPAKOTE 250 MG TBEC (DIVALPROEX SODIUM) two times a Cheryl (level at 29.1 on this dose)  Tried to call Cheryl Thompson at above number and now correct  [name] NP  February 15, 2010 12:03 PM  Spoke with Cheryl Thompson and she believes she has a TIA, pt was [facility] to ER from [facility]. [name] NP  February 15, 2010 12:08 PM

## 2010-08-16 DIAGNOSIS — F1410 Cocaine abuse, uncomplicated: Secondary | ICD-10-CM

## 2010-08-17 NOTE — Miscellaneous (Signed)
  Clinical Lists Changes 

## 2010-08-17 NOTE — Assessment & Plan Note (Signed)
Summary: HEP B #2  Prior Medications: DEPAKOTE 250 MG TBEC (DIVALPROEX SODIUM) two times a day (level at 29.1 on this dose) LISINOPRIL-HYDROCHLOROTHIAZIDE 20-12.5 MG TABS (LISINOPRIL-HYDROCHLOROTHIAZIDE) one daily AMLODIPINE BESYLATE 5 MG TABS (AMLODIPINE BESYLATE) one daily AMITRIPTYLINE HCL 50 MG TABS (AMITRIPTYLINE HCL) use one to one and 1/2 to 2 at bedtime Current Allergies: ! PENICILLIN ! NAPROSYN Immunizations Administered:  Hepatitis B Vaccine # 2:    Vaccine Type: HepB Adult    Site: left deltoid    Mfr: Merck    Dose: 0.5 ml    Route: IM    Given by: Jennet Maduro RN    Exp. Date: 07/03/2012    Lot #: [id number]  Orders Added: 1)  Hepatitis B Vaccine >36yrs [90746] 2)  Admin 1st Vaccine [04540]

## 2010-08-21 ENCOUNTER — Encounter: Payer: Self-pay | Admitting: Family Medicine

## 2010-08-21 ENCOUNTER — Inpatient Hospital Stay (INDEPENDENT_AMBULATORY_CARE_PROVIDER_SITE_OTHER): Payer: Self-pay | Admitting: Family Medicine

## 2010-08-21 DIAGNOSIS — Z8669 Personal history of other diseases of the nervous system and sense organs: Secondary | ICD-10-CM | POA: Insufficient documentation

## 2010-08-21 DIAGNOSIS — I10 Essential (primary) hypertension: Secondary | ICD-10-CM

## 2010-08-21 DIAGNOSIS — F1910 Other psychoactive substance abuse, uncomplicated: Secondary | ICD-10-CM

## 2010-08-21 DIAGNOSIS — F319 Bipolar disorder, unspecified: Secondary | ICD-10-CM

## 2010-08-21 DIAGNOSIS — M79609 Pain in unspecified limb: Secondary | ICD-10-CM

## 2010-08-22 NOTE — H&P (Addendum)
Cheryl Thompson, Cheryl Thompson                ACCOUNT NO.:  [id number]  MEDICAL RECORD NO.:  [id number]          PATIENT TYPE:  IPS  LOCATION:  0304                          [facility]ILITY[facility]  PHYSICIAN:  Eulogio Ditch, MD DATE OF BIRTH:  03/12/1966  DATE OF ADMISSION:  08/15/2010 DATE OF DISCHARGE:                      PSYCHIATRIC ADMISSION ASSESSMENT   She is a 45 year old African American female.  Patient presented to the emergency room for flu-like symptoms, however, when she was there she stated that she overdosed and she just did not care anymore.  The patient reports that she had just been diagnosed with HIV in September of this year and went on a relapse of alcohol and drugs.  She had been sober for 6 months until her relapse yesterday.  PAST PSYCHIATRIC HISTORY:  None.  The patient denies any previous admissions for psychiatric issues other than her alcohol rehab facility in July 2011 in [place].  SOCIAL HISTORY:  She is single, has a live-in boyfriend of 2 years, currently employed and currently going to school.  FAMILY HISTORY:  Substance abuse is denied.  No psychiatric history in the family.  MEDICAL HISTORY:  She does not have a primary care physician but has recently been seen in the [facility] outpatient clinic and is followed there.  PRIVATE PSYCHIATRIST:  None.  CURRENT MEDICAL PROBLEMS:  HIV positive, not currently needing antivirals.  She has a history of seizure disorder for which she takes Depakote and a history of hypertension for which she takes lisinopril/hydrochlorothiazide.  CURRENT MEDICATIONS: 1. Lisinopril/hydrochlorothiazide 20/25. 2. Depakote 250 b.i.d. 3. She occasionally uses Imitrex for migraines. 4. She also takes amlodipine 5 mg p.o. q.a.m. for hypertension. 5. Has also been on amitriptyline 50 mg 1 to 1-1/2 tablets at night     for what she describes as chronic pain.  DRUG ALLERGIES: 1. Penicillin. 2. Naproxen sodium.  PHYSICAL  EXAMINATION:  VITAL SIGNS:  The patient was evaluated in the emergency room where the vital signs were on admission temperature of 98.2, blood pressure of 149/105 sitting, pulse 100, respirations 18.  LABORATORY DATA:  Initial labs to include a white count which was unremarkable.  Acetaminophen level was low.  Alcohol level was 50. Basic metabolic profile includes a normal sodium, potassium chloride, carbon dioxide, BUN, creatinine all were normal with the exception of the SGOT which was 69.  Salicylate again less than 4.  Valproic acid also low less than 10.  Urine drug screen was positive for cocaine and cannabis.  Urine was minimally abnormal, cloudy in appearance and positive for leukocyte esterase, many epithelials and a few bacteria. Fibrin derivatives:  D-dimer was done showed a value of less than 0.22. Chest x-ray was clear.  The patient was treated with fluids, given Zithromax 500 mg p.o. and given a diagnosis of primary depression and sinusitis.  She was given an albuterol treatment, prednisone and transferred to mental health for evaluation.  Mental status at the time of the exam:  The patient appeared depressed, reported suicidal ideation.  No specific plan but was at the point of not caring if she lived anymore.  She has plenty  of access to means, alcohol and drugs.  She reports one previous attempt.  Denied auditory or visual hallucinations and endorsed feelings of hopelessness, helplessness depressed, despondency, tearfulness, isolation, loss of interest and feelings of self worthlessness and self pity.  No homicidality.  No evidence of psychosis.  The patient was casually dressed and had good eye contact normal.  Normal speech.  Was alert and oriented, tearful and depressed.  Affect was appropriate.  Minimal anxiety.  Normal thought process, coherent, relevant.  Speech was clear and goal-directed.  Poor judgment and poor insight.  She was oriented. Memory was intact  and concentration certainly decreased.  Insight and impulse control both poor.  ASSESSMENT:  Axis I:  Major depressive disorder, secondary to chronic medical illness with relapse and suicidal ideation. Axis II:  Negative. Axis III:  Burden of chronic illnesses including seizure disorder, hypertension and human immunodeficiency virus positive. Axis IV:  A good family support with a 99-month history of sobriety. Axis V:  GAF currently 45.  PLAN:  The patient will be admitted for treatment with antidepressive medication and stabilization.  Estimated length of stay is 3-5 days.    ______________________________ Verne Spurr, PA   ______________________________ Eulogio Ditch, MD    NM/MEDQ  D:  08/16/2010  T:  08/16/2010  Job:  [id number]  Electronically Signed by Eulogio Ditch  on 08/22/2010 09:59:29 AM Electronically Signed by Verne Spurr  on 08/24/2010 10:33:49 AM
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
## 2010-08-31 NOTE — Assessment & Plan Note (Signed)
Summary: Cheryl Thompson,df   Vital Signs:  Patient profile:   45 year old female Height:      69 inches Weight:      177 pounds BMI:     26.23 Temp:     98.4 degrees F oral Pulse rate:   71 / minute BP sitting:   146 / 97  (left arm) Cuff size:   regular  Vitals Entered By: Tessie Fass CMA (August 21, 2010 11:18 AM) CC: hospital f/u Pain Assessment Patient in pain? no        CC:  hospital f/u.  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  She immediatly presented to the ER withing 2 days and was admitted.  She was started on citalopram and is feeling better.  She has joined a HIV support group, and continues to go to [employer].  BP has been elevated, she is almost of out meds.  Current Medications (verified): 1)  Depakote 250 Mg Tbec (Divalproex Sodium) .... Two Times A Day (Level At 29.1 On This Dose) 2)  Lisinopril-Hydrochlorothiazide 20-12.5 Mg Tabs (Lisinopril-Hydrochlorothiazide) .... One Daily 3)  Amlodipine Besylate 10 Mg Tabs (Amlodipine Besylate) .... One Daily 4)  Citalopram Hydrobromide 20 Mg Tabs (Citalopram Hydrobromide) .... One Daily (Per Mental Health)  Allergies (verified): 1)  ! Penicillin 2)  ! Naprosyn  Review of Systems General:  Denies fatigue, malaise, and sleep disorder. CV:  Denies chest pain or discomfort. MS:  Denies joint pain; leg pain at night. Psych:  Denies suicidal thoughts/plans.  Physical Exam  General:  Well-developed,well-nourished,in no acute distress; alert,appropriate and cooperative throughout examination Ears:  R ear normal and L ear normal.   Mouth:  pharynx pink and moist and fair dentition.   Neck:  No deformities, masses, or tenderness noted. Lungs:  normal respiratory effort and normal breath sounds.   Heart:  normal rate and regular rhythm.   Psych:  normally interactive, good eye contact, not  anxious appearing, and not depressed appearing.     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 Her updated medication list for this problem includes:    Lisinopril-hydrochlorothiazide 20-12.5 Mg Tabs (Lisinopril-hydrochlorothiazide) ..... One daily    Amlodipine Besylate 10 Mg Tabs (Amlodipine besylate) ..... One daily  Orders: [facility]- Est Level  3 (99213)  Problem # 4:  LEG PAIN, BILATERAL (ICD-729.5)  improved on citalopram, not as much of an issue today  Orders: [facility]- Est Level  3 (95621)  Complete Medication List: 1)  Depakote 250 Mg Tbec (Divalproex sodium) .... Two times a day (level at 29.1 on this dose) 2)  Lisinopril-hydrochlorothiazide 20-12.5 Mg Tabs (Lisinopril-hydrochlorothiazide) .... One daily 3)  Amlodipine Besylate 10 Mg Tabs (Amlodipine besylate) .... One daily 4)  Citalopram Hydrobromide 20 Mg Tabs (Citalopram hydrobromide) .... One daily (per mental health)  Patient Instructions: 1)  Continue with the meds that you are ordered 2)  Mammogram scholarship 3)  Check BP at  [facility] clinic, goal to be 130/80 (140/85 accept), return sooner if you BP is not coming down 4)  return in 3-6 months or as needed Prescriptions: DEPAKOTE 250 MG TBEC (DIVALPROEX SODIUM) two times a day (level at 29.1 on this dose)  #60 x 6   Entered and Authorized by:   [name] NP   Signed by:   [name] NP on 08/21/2010   Method used:   Electronically to        [facility] [phone]* (retail)       [address]       [place], [place]  [address]       Ph:  [phone]       Fax: [phone]   RxID:   [id number] LISINOPRIL-HYDROCHLOROTHIAZIDE 20-12.5 MG TABS (LISINOPRIL-HYDROCHLOROTHIAZIDE) one daily  #30 x 6   Entered and Authorized by:   [name] NP   Signed by:   [name] NP on 08/21/2010   Method used:   Electronically to        [facility] [address] [phone]* (retail)       [address]       [place], [place]  [address]       Ph: [phone]       Fax: [phone]   RxID:   [id number] AMLODIPINE BESYLATE 10 MG TABS (AMLODIPINE BESYLATE) one daily  #30 x 6   Entered and Authorized by:   [name] NP   Signed by:   [name] NP on 08/21/2010   Method used:   Electronically to        [facility] [address] [phone]* (retail)       [address]       [place], [place]  [address]       Ph: [phone]       Fax: [phone]   RxID:   [id number]    Orders Added: 1)  [facility]- Est Level  3 [62694]     Prevention & Chronic Care Immunizations   Influenza vaccine: Fluvax 3+  (03/28/2010)    Tetanus booster: 02/14/2010: Tdap    Pneumococcal vaccine: Pneumovax  (03/28/2010)  Other Screening   Pap smear: Not documented    Mammogram: Not documented   Smoking status: current  (04/20/2010)  Lipids   Total Cholesterol: 154  (03/28/2010)   LDL: 74  (03/28/2010)   LDL Direct: Not documented   HDL: 27  (03/28/2010)   Triglycerides: 266  (03/28/2010)  Hypertension   Last Blood Pressure: 146 / 97  (08/21/2010)   Serum creatinine: 0.76  (03/28/2010)   Serum potassium 3.7  (03/28/2010)  Self-Management Support :   Personal Goals (by the next clinic visit) :      Personal blood pressure goal: 130/80  (02/14/2010)   Hypertension self-management support: Not documented

## 2010-09-28 LAB — BODY FLUID CULTURE: Culture: NO GROWTH

## 2010-09-28 LAB — SYNOVIAL CELL COUNT + DIFF, W/ CRYSTALS
Crystals, Fluid: NONE SEEN
WBC, Synovial: 280 /mm3 — ABNORMAL HIGH (ref 0–200)

## 2010-09-29 LAB — POCT I-STAT, CHEM 8
Calcium, Ion: 1.05 mmol/L — ABNORMAL LOW (ref 1.12–1.32)
Creatinine, Ser: 0.8 mg/dL (ref 0.4–1.2)
Hemoglobin: 12.2 g/dL (ref 12.0–15.0)
Sodium: 141 mEq/L (ref 135–145)
TCO2: 25 mmol/L (ref 0–100)

## 2010-09-29 LAB — RAPID URINE DRUG SCREEN, HOSP PERFORMED
Amphetamines: NOT DETECTED
Opiates: NOT DETECTED
Tetrahydrocannabinol: NOT DETECTED

## 2010-09-29 LAB — VALPROIC ACID LEVEL: Valproic Acid Lvl: 44.9 ug/mL — ABNORMAL LOW (ref 50.0–100.0)

## 2010-09-30 LAB — BASIC METABOLIC PANEL
Calcium: 8.8 mg/dL (ref 8.4–10.5)
Creatinine, Ser: 0.8 mg/dL (ref 0.4–1.2)
GFR calc non Af Amer: >60 mL/min (ref >60)
Glucose, Bld: 109 mg/dL — ABNORMAL HIGH (ref 70–99)
Sodium: 144 mEq/L (ref 135–145)

## 2010-09-30 LAB — CBC
Hemoglobin: 10.8 g/dL — ABNORMAL LOW (ref 12.0–15.0)
MCHC: 34 g/dL (ref 30.0–36.0)
Platelets: 135 10*3/uL — ABNORMAL LOW (ref 150–400)
RDW: 13.2 % (ref 11.5–15.5)

## 2010-10-08 LAB — BASIC METABOLIC PANEL
BUN: 10 mg/dL (ref 6–23)
BUN: 7 mg/dL (ref 6–23)
CO2: 30 mEq/L (ref 19–32)
Calcium: 8.8 mg/dL (ref 8.4–10.5)
Chloride: 104 mEq/L (ref 96–112)
Creatinine, Ser: 0.77 mg/dL (ref 0.4–1.2)
Creatinine, Ser: 0.78 mg/dL (ref 0.4–1.2)
GFR calc Af Amer: >60 mL/min (ref >60)
GFR calc non Af Amer: >60 mL/min (ref >60)
Glucose, Bld: 105 mg/dL — ABNORMAL HIGH (ref 70–99)

## 2010-10-08 LAB — COMPREHENSIVE METABOLIC PANEL
AST: 98 U/L — ABNORMAL HIGH (ref 0–37)
Albumin: 3.1 g/dL — ABNORMAL LOW (ref 3.5–5.2)
Alkaline Phosphatase: 62 U/L (ref 39–117)
Chloride: 101 mEq/L (ref 96–112)
Creatinine, Ser: 0.81 mg/dL (ref 0.4–1.2)
GFR calc Af Amer: >60 mL/min (ref >60)
Potassium: 3.3 mEq/L — ABNORMAL LOW (ref 3.5–5.1)
Total Bilirubin: 0.6 mg/dL (ref 0.3–1.2)
Total Protein: 7.3 g/dL (ref 6.0–8.3)

## 2010-10-08 LAB — URINALYSIS, ROUTINE W REFLEX MICROSCOPIC
Glucose, UA: NEGATIVE mg/dL
Nitrite: NEGATIVE
Specific Gravity, Urine: 1.012 (ref 1.005–1.030)
pH: 6 (ref 5.0–8.0)

## 2010-10-08 LAB — CBC
HCT: 34.7 % — ABNORMAL LOW (ref 36.0–46.0)
Hemoglobin: 11.8 g/dL — ABNORMAL LOW (ref 12.0–15.0)
MCHC: 34.5 g/dL (ref 30.0–36.0)
MCV: 94.9 fL (ref 78.0–100.0)
MCV: 95.7 fL (ref 78.0–100.0)
Platelets: 103 10*3/uL — ABNORMAL LOW (ref 150–400)
Platelets: 113 10*3/uL — ABNORMAL LOW (ref 150–400)
RBC: 3.5 MIL/uL — ABNORMAL LOW (ref 3.87–5.11)
RDW: 13.2 % (ref 11.5–15.5)
WBC: 5.1 10*3/uL (ref 4.0–10.5)
WBC: 5.4 10*3/uL (ref 4.0–10.5)
WBC: 7.3 10*3/uL (ref 4.0–10.5)

## 2010-10-08 LAB — HEPATITIS PANEL, ACUTE
HCV Ab: REACTIVE — AB
Hep A IgM: NEGATIVE
Hepatitis B Surface Ag: NEGATIVE

## 2010-10-08 LAB — RAPID URINE DRUG SCREEN, HOSP PERFORMED: Tetrahydrocannabinol: POSITIVE — AB

## 2010-10-08 LAB — VALPROIC ACID LEVEL
Valproic Acid Lvl: 28.2 ug/mL — ABNORMAL LOW (ref 50.0–100.0)
Valproic Acid Lvl: <10 ug/mL — ABNORMAL LOW (ref 50.0–100.0)

## 2010-10-08 LAB — CARDIAC PANEL(CRET KIN+CKTOT+MB+TROPI)
CK, MB: 1.1 ng/mL (ref 0.3–4.0)
Relative Index: 0.6 (ref 0.0–2.5)
Troponin I: 0.05 ng/mL (ref 0.00–0.06)

## 2010-10-08 LAB — DIFFERENTIAL
Eosinophils Absolute: 0.2 10*3/uL (ref 0.0–0.7)
Lymphocytes Relative: 42 % (ref 12–46)
Lymphs Abs: 2.2 10*3/uL (ref 0.7–4.0)
Neutrophils Relative %: 42 % — ABNORMAL LOW (ref 43–77)

## 2010-10-08 LAB — HCV RNA QUANT
HCV Quantitative Log: 6.32 {Log} — ABNORMAL HIGH (ref <1.63)
HCV Quantitative: 2070000 IU/mL — ABNORMAL HIGH (ref <43)

## 2010-10-08 LAB — CK TOTAL AND CKMB (NOT AT ARMC)
CK, MB: 1 ng/mL (ref 0.3–4.0)
Relative Index: 0.8 (ref 0.0–2.5)

## 2010-10-08 LAB — GLUCOSE, CAPILLARY: Glucose-Capillary: 178 mg/dL — ABNORMAL HIGH (ref 70–99)

## 2010-10-08 LAB — PHENYTOIN LEVEL, TOTAL: Phenytoin Lvl: <2.5 ug/mL — ABNORMAL LOW (ref 10.0–20.0)

## 2010-10-23 LAB — URINALYSIS, ROUTINE W REFLEX MICROSCOPIC
Hgb urine dipstick: NEGATIVE
Protein, ur: NEGATIVE mg/dL
Urobilinogen, UA: 0.2 mg/dL (ref 0.0–1.0)

## 2010-10-23 LAB — CBC
MCHC: 34.2 g/dL (ref 30.0–36.0)
MCV: 92.8 fL (ref 78.0–100.0)
Platelets: 122 10*3/uL — ABNORMAL LOW (ref 150–400)
RDW: 13.1 % (ref 11.5–15.5)
WBC: 7.9 10*3/uL (ref 4.0–10.5)

## 2010-10-23 LAB — DIFFERENTIAL
Eosinophils Relative: 1 % (ref 0–5)
Lymphocytes Relative: 15 % (ref 12–46)
Lymphs Abs: 1.2 10*3/uL (ref 0.7–4.0)
Monocytes Absolute: 0.4 10*3/uL (ref 0.1–1.0)

## 2010-10-23 LAB — COMPREHENSIVE METABOLIC PANEL
AST: 57 U/L — ABNORMAL HIGH (ref 0–37)
Albumin: 3.3 g/dL — ABNORMAL LOW (ref 3.5–5.2)
Calcium: 9.1 mg/dL (ref 8.4–10.5)
Creatinine, Ser: 0.84 mg/dL (ref 0.4–1.2)
GFR calc Af Amer: >60 mL/min (ref >60)
Total Protein: 6.8 g/dL (ref 6.0–8.3)

## 2010-11-28 ENCOUNTER — Other Ambulatory Visit (INDEPENDENT_AMBULATORY_CARE_PROVIDER_SITE_OTHER): Payer: Self-pay

## 2010-11-28 ENCOUNTER — Encounter: Payer: Self-pay | Admitting: Family Medicine

## 2010-11-28 ENCOUNTER — Ambulatory Visit (INDEPENDENT_AMBULATORY_CARE_PROVIDER_SITE_OTHER): Payer: Self-pay | Admitting: Family Medicine

## 2010-11-28 DIAGNOSIS — B20 Human immunodeficiency virus [HIV] disease: Secondary | ICD-10-CM

## 2010-11-28 DIAGNOSIS — M25561 Pain in right knee: Secondary | ICD-10-CM

## 2010-11-28 DIAGNOSIS — I10 Essential (primary) hypertension: Secondary | ICD-10-CM

## 2010-11-28 DIAGNOSIS — M25562 Pain in left knee: Secondary | ICD-10-CM

## 2010-11-28 DIAGNOSIS — F319 Bipolar disorder, unspecified: Secondary | ICD-10-CM

## 2010-11-28 DIAGNOSIS — M25569 Pain in unspecified knee: Secondary | ICD-10-CM

## 2010-11-28 MED ORDER — LISINOPRIL-HYDROCHLOROTHIAZIDE 20-12.5 MG PO TABS: 1.0000 | ORAL_TABLET | Freq: Every day | ORAL | Status: DC

## 2010-11-28 MED ORDER — IBUPROFEN 800 MG PO TABS: 800.0000 mg | ORAL_TABLET | Freq: Three times a day (TID) | ORAL | Status: AC | PRN

## 2010-11-28 MED ORDER — DIVALPROEX SODIUM ER 500 MG PO TB24: 500.0000 mg | ORAL_TABLET | Freq: Every day | ORAL | Status: DC

## 2010-11-28 MED ORDER — CITALOPRAM HYDROBROMIDE 20 MG PO TABS: 20.0000 mg | ORAL_TABLET | Freq: Every day | ORAL | Status: DC

## 2010-11-28 NOTE — Patient Instructions (Addendum)
Apt with [facility]/ortho clinic for knee pain (orange card) Please go to the hospital and get Xrays before the apt with them Begin iburprofen 800 mg tid for now, if you develop hives stop and let me know Begin the antidepressant Please go to the health department to pick up your BP meds, and the others prescribed today Return to see me in 4 weeks to check BP

## 2010-11-28 NOTE — Assessment & Plan Note (Signed)
Counseled regarding alcohol and it direct impact on BP, pain and depression.  She denies crack at this time.  She would benefit from AA, has done so in the past.

## 2010-11-28 NOTE — Assessment & Plan Note (Signed)
Will obtain standing bilateral standing Xrays and refer to [facility] clinic for evaluation.  Taught straight leg raises to strengthen her quads.  Ibuprofen, for pain ; add SSRI for depression that may help.

## 2010-11-28 NOTE — Progress Notes (Signed)
  Subjective:    Patient ID: Cheryl Thompson, female    DOB: 10/02/1965, 45 y.o.   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  She reports history of right knee surgery when she was 14, for ? Problems with dislocation.  Right knee pain is severe, left knee pain in tolerable.  She has a reported allergy to naproxen but she says that she can take ibuprofen.  She is back drinking alcohol to numb her pain, smokes marijuana.  She denies crack use or IV use.  She has been through rehab multiple times, found to be HIV + and is followed by [facility], T cell counts have not warranted medications at this point.  She is taking her Depakote intermittently, she does not take it when she drinks.  She was placed on this for alcohol withdrawal seizures and my mental health after inpatient behavioral stay.  She has not been taking her antidepressant.     Review of Systems  Constitutional: Negative for fever and chills.  Musculoskeletal: Positive for joint swelling and arthralgias.  Psychiatric/Behavioral: Positive for dysphoric mood and decreased concentration.       + substance abuse       Objective:   Physical Exam  Constitutional:       Well groomed and alert  Musculoskeletal:       Right knee with incision below the patella, palpable surgical metal in the area of the tibial tuberosity. Knee is boggy, tender everywhere, she cannot fully extend, but has good flexion. Right knee with pre-patella tenderness, good stability, and full ROM.           Assessment & Plan:

## 2010-11-28 NOTE — Assessment & Plan Note (Signed)
Not taking BP meds, new script at a higher dosage, alcohol likely contributing, recheck in one month.

## 2010-11-28 NOTE — Assessment & Plan Note (Signed)
Depakote for both history of seizures and hypomania, discharged from behavioral health stay on this dose.  Add SSRI low dose, monitor for hypomania.

## 2010-11-29 LAB — COMPLETE METABOLIC PANEL WITH GFR
Albumin: 3.7 g/dL (ref 3.5–5.2)
Alkaline Phosphatase: 61 U/L (ref 39–117)
BUN: 14 mg/dL (ref 6–23)
CO2: 26 mEq/L (ref 19–32)
GFR, Est African American: >60 mL/min (ref >60)
GFR, Est Non African American: >60 mL/min (ref >60)
Glucose, Bld: 91 mg/dL (ref 70–99)
Potassium: 3.9 mEq/L (ref 3.5–5.3)
Total Protein: 7.3 g/dL (ref 6.0–8.3)

## 2010-11-29 LAB — CBC WITH DIFFERENTIAL/PLATELET
Basophils Relative: 0 % (ref 0–1)
Eosinophils Absolute: 0.1 10*3/uL (ref 0.0–0.7)
HCT: 38.2 % (ref 36.0–46.0)
Hemoglobin: 12.6 g/dL (ref 12.0–15.0)
Lymphs Abs: 2.4 10*3/uL (ref 0.7–4.0)
MCH: 31.3 pg (ref 26.0–34.0)
MCHC: 33 g/dL (ref 30.0–36.0)
Monocytes Absolute: 0.6 10*3/uL (ref 0.1–1.0)
Monocytes Relative: 10 % (ref 3–12)
Neutro Abs: 3.1 10*3/uL (ref 1.7–7.7)
RBC: 4.02 MIL/uL (ref 3.87–5.11)

## 2010-11-29 LAB — T-HELPER CELL (CD4) - (RCID CLINIC ONLY): CD4 % Helper T Cell: 45 % (ref 33–55)

## 2010-11-29 LAB — HIV-1 RNA QUANT-NO REFLEX-BLD: HIV 1 RNA Quant: 14700 copies/mL — ABNORMAL HIGH (ref <20)

## 2010-12-04 ENCOUNTER — Ambulatory Visit (HOSPITAL_COMMUNITY)
Admission: RE | Admit: 2010-12-04 | Discharge: 2010-12-04 | Disposition: A | Discharge status: Home / Self Care | Payer: Self-pay | Source: Ambulatory Visit | Attending: Family Medicine | Admitting: Family Medicine

## 2010-12-04 ENCOUNTER — Ambulatory Visit (INDEPENDENT_AMBULATORY_CARE_PROVIDER_SITE_OTHER): Payer: Self-pay | Admitting: Family Medicine

## 2010-12-04 ENCOUNTER — Encounter: Payer: Self-pay | Admitting: Family Medicine

## 2010-12-04 VITALS — BP 138/91 | Ht 69.0 in | Wt 169.0 lb

## 2010-12-04 DIAGNOSIS — M25562 Pain in left knee: Secondary | ICD-10-CM

## 2010-12-04 DIAGNOSIS — M25569 Pain in unspecified knee: Secondary | ICD-10-CM

## 2010-12-04 DIAGNOSIS — M1710 Unilateral primary osteoarthritis, unspecified knee: Secondary | ICD-10-CM | POA: Insufficient documentation

## 2010-12-04 DIAGNOSIS — M25561 Pain in right knee: Secondary | ICD-10-CM

## 2010-12-04 DIAGNOSIS — IMO0002 Reserved for concepts with insufficient information to code with codable children: Secondary | ICD-10-CM | POA: Insufficient documentation

## 2010-12-05 NOTE — Progress Notes (Signed)
  Subjective:    Patient ID: Cheryl Thompson, female    DOB: Mar 14, 1966, 45 y.o.   MRN: [id number]  HPI B R>L knee pain. Many months, worsening. Has had some type of surgery done for dislocating knee cap 15 y ago. Right knee swelss if she stands on it long time. Aches all of the time Recently had x rays. Left knee similar symptoms but not as bad.    Review of Systems No knee locking or giving way    Objective:   Physical Exam     WD NAD Right knee small effusion. Lacks complete extension by 50 degrees. Subcutaneous clips easily palpated below patella with well headed linear scar.ligamentously intact. Calves are soft.  Left knee FROM and ligamentously intact. No effusion  XRAYS reviewed. Some joint space lost w osteophytes on right and evidence of clips in proximal tibia.   Assessment & Plan:  OA B knees Discussed options incl injection which she wishes to defer at this time.

## 2010-12-07 ENCOUNTER — Ambulatory Visit: Payer: Self-pay | Admitting: Internal Medicine

## 2010-12-08 ENCOUNTER — Encounter: Payer: Self-pay | Admitting: Family Medicine

## 2010-12-08 ENCOUNTER — Ambulatory Visit (INDEPENDENT_AMBULATORY_CARE_PROVIDER_SITE_OTHER): Payer: Self-pay | Admitting: Family Medicine

## 2010-12-08 VITALS — BP 145/92 | HR 67

## 2010-12-08 DIAGNOSIS — M25561 Pain in right knee: Secondary | ICD-10-CM

## 2010-12-08 DIAGNOSIS — M25569 Pain in unspecified knee: Secondary | ICD-10-CM

## 2010-12-08 DIAGNOSIS — M25562 Pain in left knee: Secondary | ICD-10-CM

## 2010-12-08 NOTE — Assessment & Plan Note (Signed)
Knee injection CSI

## 2010-12-08 NOTE — Progress Notes (Signed)
  Subjective:    Patient ID: Cheryl Thompson, female    DOB: March 29, 1966, 45 y.o.   MRN: [id number]  HPI  Here for right knee injection. Has thought about it and sure she wants to go forward with this. No new symptoms. Has not noticed any new warmth or rash around the knee. No redness.  Review of Systems    no fever. Objective:   Physical Exam    KNEE: Right knee has boggy synovium, well-healed horizontal scar below the tibial tubercle. There are palpable staples on the anterior tibia. There is slight effusion. There is no warmth. Limited extension lacking 30 of full extension. Ligamentously stable. Tender to palpation medial and lateral jointline. Patella is stable.  INJECTION: Patient was given informed consent, signed copy in the chart. Appropriate time out was taken. Area prepped and draped in usual sterile fashion. 1 cc of kenalog plus  4 cc of lidocaine was injected into the right using a(n) anterior approach. The patient tolerated the procedure well. There were no complications. Post procedure instructions were given.    Assessment & Plan:  #1 right knee arthritis with chronic mild effusion and synovial changes. Corticosteroid injection today. Discussed that she should not plan to have another one for 3 months if this helps. Discussed red flags. Return to clinic when necessary.

## 2010-12-18 ENCOUNTER — Ambulatory Visit: Payer: Self-pay | Admitting: Family Medicine

## 2010-12-19 ENCOUNTER — Other Ambulatory Visit: Payer: Self-pay | Admitting: Family Medicine

## 2010-12-19 DIAGNOSIS — Z1231 Encounter for screening mammogram for malignant neoplasm of breast: Secondary | ICD-10-CM

## 2010-12-25 ENCOUNTER — Ambulatory Visit: Payer: Self-pay | Admitting: Family Medicine

## 2010-12-26 ENCOUNTER — Ambulatory Visit: Payer: Self-pay | Admitting: Internal Medicine

## 2010-12-28 ENCOUNTER — Ambulatory Visit (HOSPITAL_COMMUNITY): Payer: Self-pay | Attending: Family Medicine

## 2011-04-16 ENCOUNTER — Ambulatory Visit: Payer: Self-pay | Admitting: Family Medicine

## 2011-04-16 LAB — POCT I-STAT, CHEM 8
BUN: 11
Calcium, Ion: 1.12
Chloride: 107
Creatinine, Ser: 1.3 — ABNORMAL HIGH
Glucose, Bld: 64 — ABNORMAL LOW
HCT: 39
Hemoglobin: 13.3
Potassium: 3.8
Sodium: 135
TCO2: 19

## 2011-04-16 LAB — CBC
HCT: 38.1
Hemoglobin: 12.9
MCHC: 33.9
MCV: 93
Platelets: 166
RDW: 14.6

## 2011-04-16 LAB — DIFFERENTIAL
Basophils Absolute: 0
Basophils Relative: 0
Eosinophils Absolute: 0.1
Eosinophils Relative: 1
Lymphocytes Relative: 32
Monocytes Absolute: 1.1 — ABNORMAL HIGH

## 2011-04-16 LAB — POCT CARDIAC MARKERS
CKMB, poc: 3.1
Myoglobin, poc: 118
Troponin i, poc: <0.05

## 2011-04-16 LAB — URINALYSIS, ROUTINE W REFLEX MICROSCOPIC
Glucose, UA: NEGATIVE
Hgb urine dipstick: NEGATIVE
Ketones, ur: >80 — AB
Protein, ur: NEGATIVE

## 2011-04-25 ENCOUNTER — Encounter: Payer: Self-pay | Admitting: Family Medicine

## 2011-04-25 ENCOUNTER — Ambulatory Visit (INDEPENDENT_AMBULATORY_CARE_PROVIDER_SITE_OTHER): Payer: Self-pay | Admitting: Family Medicine

## 2011-04-25 ENCOUNTER — Inpatient Hospital Stay (HOSPITAL_COMMUNITY): Payer: Self-pay

## 2011-04-25 ENCOUNTER — Observation Stay (HOSPITAL_COMMUNITY)
Admission: AD | Admit: 2011-04-25 | Discharge: 2011-04-26 | Disposition: A | Discharge status: Home / Self Care | Payer: Self-pay | Source: Ambulatory Visit | Attending: Family Medicine | Admitting: Family Medicine

## 2011-04-25 VITALS — BP 168/106 | HR 79 | Temp 98.5°F | Ht 69.0 in | Wt 164.0 lb

## 2011-04-25 DIAGNOSIS — J069 Acute upper respiratory infection, unspecified: Principal | ICD-10-CM | POA: Insufficient documentation

## 2011-04-25 DIAGNOSIS — B9789 Other viral agents as the cause of diseases classified elsewhere: Secondary | ICD-10-CM

## 2011-04-25 DIAGNOSIS — M545 Low back pain, unspecified: Secondary | ICD-10-CM | POA: Insufficient documentation

## 2011-04-25 DIAGNOSIS — B349 Viral infection, unspecified: Secondary | ICD-10-CM

## 2011-04-25 DIAGNOSIS — R112 Nausea with vomiting, unspecified: Secondary | ICD-10-CM | POA: Insufficient documentation

## 2011-04-25 DIAGNOSIS — I10 Essential (primary) hypertension: Secondary | ICD-10-CM | POA: Insufficient documentation

## 2011-04-25 DIAGNOSIS — Z23 Encounter for immunization: Secondary | ICD-10-CM | POA: Insufficient documentation

## 2011-04-25 DIAGNOSIS — B20 Human immunodeficiency virus [HIV] disease: Secondary | ICD-10-CM

## 2011-04-25 DIAGNOSIS — F319 Bipolar disorder, unspecified: Secondary | ICD-10-CM | POA: Insufficient documentation

## 2011-04-25 DIAGNOSIS — Z21 Asymptomatic human immunodeficiency virus [HIV] infection status: Secondary | ICD-10-CM | POA: Insufficient documentation

## 2011-04-25 DIAGNOSIS — R7989 Other specified abnormal findings of blood chemistry: Secondary | ICD-10-CM | POA: Insufficient documentation

## 2011-04-25 LAB — COMPREHENSIVE METABOLIC PANEL
Albumin: 3.1 g/dL — ABNORMAL LOW (ref 3.5–5.2)
BUN: 9 mg/dL (ref 6–23)
Chloride: 102 mEq/L (ref 96–112)
Creatinine, Ser: 0.81 mg/dL (ref 0.50–1.10)
GFR calc Af Amer: >90 mL/min (ref >90)
GFR calc non Af Amer: 86 mL/min — ABNORMAL LOW (ref >90)
Total Bilirubin: 0.4 mg/dL (ref 0.3–1.2)

## 2011-04-25 LAB — CBC
HCT: 34.4 % — ABNORMAL LOW (ref 36.0–46.0)
MCV: 95.6 fL (ref 78.0–100.0)
RDW: 13.7 % (ref 11.5–15.5)
WBC: 6.3 10*3/uL (ref 4.0–10.5)

## 2011-04-25 LAB — VALPROIC ACID LEVEL: Valproic Acid Lvl: <10 ug/mL — ABNORMAL LOW (ref 50.0–100.0)

## 2011-04-25 LAB — MRSA PCR SCREENING: MRSA by PCR: NEGATIVE

## 2011-04-25 NOTE — H&P (Signed)
Family Medicine Teaching [facility] Admission History and Physical  Patient name: Cheryl Thompson Medical record number: [id number] Date of birth: 12/12/1965 Age: 45 y.o. Gender: female  Primary Care Provider: RED, [facility] TEAM, MD  Chief Complaint: viral illness History of Present Illness: Cheryl Thompson is a 45 y.o. year old female presenting with viral illness x5 days. Patient states she noticed generalized viral symptoms started on Saturday of this week. Symptoms include generalized malaise nasal congestion or rhinorrhea cough. Patient denies any true fever however patient does report subjective fevers and chills. Patient also reports decreased by mouth intake would not be able to hold down any solids or liquids for the past 48 hours. From an HIV standpoint patient is currently not on HAART therapy. Patient says she has not seen her infectious disease doctor for over 6 months. Patient unsure of her last CD4 count. Patient does report some neck pain but  denies stiffness. No rash. Mild headache. Cough is nonproductive. Minimal increased work of breathing. No chest pain. No reported seizure activity.  Patient Active Problem List  Diagnoses  . HIV INFECTION  . BIPOLAR AFFECTIVE DISORDER  . SUBSTANCE ABUSE, MULTIPLE  . ESSENTIAL HYPERTENSION, BENIGN  . BACK PAIN, LUMBAR  . SEIZURES, HX OF  . Knee pain, bilateral   Past Medical History: Past Medical History  Diagnosis Date  . HIV (human immunodeficiency virus infection)     Past Surgical History: Past Surgical History  Procedure Date  . Total abdominal hysterectomy w/ bilateral salpingoophorectomy 2006  . Right knee surgery     around 45 years old, for ? chronic dislocation    Social History: History   Social History  . Marital Status: Single    Spouse Name: N/A    Number of Children: N/A  . Years of Education: N/A   Social History Main Topics  . Smoking status: Current Everyday Smoker -- 0.2 packs/day    Types: Cigarettes   . Smokeless tobacco: None   Comment: in process of quitting  . Alcohol Use: 7.0 oz/week    14 drink(s) per week     Drinks a pint of hard alcohol at a time  . Drug Use: 2 per week    Special: Marijuana  . Sexually Active: Yes -- Female partner(s)    Birth Control/ Protection: Other-see comments     High risk in the past/ TAH and BSO   Other Topics Concern  . None   Social History Narrative   Lives with MotherHistory of sexual and physical abuseLong standing substance abuse    Family History: No family history on file.  Allergies: Allergies  Allergen Reactions  . Naproxen   . Penicillins     Current Outpatient Prescriptions  Medication Sig Dispense Refill  . amLODipine (NORVASC) 10 MG tablet Take 10 mg by mouth daily.        . citalopram (CELEXA) 20 MG tablet Take 1 tablet (20 mg total) by mouth daily.  30 tablet  2  . divalproex (DEPAKOTE ER) 500 MG 24 hr tablet Take 1 tablet (500 mg total) by mouth daily.  30 tablet  11  . lisinopril-hydrochlorothiazide (ZESTORETIC) 20-12.5 MG per tablet Take 1 tablet by mouth daily.  30 tablet  11   Review Of Systems: See HPI, otherwise 12 point review of systems was performed and was unremarkable.  Physical Exam: BP 168/106 Pulse 79 Resp Temp 98.5 F (36.9 C) Temp src Oral SpO2 100% Weight 164 lb (74.39 kg) Height 5' 9" (  1.753 m)  General: cooperative and fatigued HEENT: PERRLA, extra ocular movement intact, sclera clear, anicteric and + cervical LAD bilaterally  Heart: S1, S2 normal, no murmur, rub or gallop, regular rate and rhythm Lungs: clear to auscultation, no wheezes or rales and unlabored breathing Abdomen: abdomen is soft without significant tenderness, masses, organomegaly or guarding Extremities: extremities normal, atraumatic, no cyanosis or edema Skin:no rashes, no ecchymoses, no petechiae Neurology: normal without focal findings, mental status, speech normal, alert and oriented x3, PERLA and reflexes normal and  symmetric  Labs and Imaging: Pending   Assessment and Plan: Chea A Morefield is a 45 y.o. year old female presenting with a viral illness in setting of HIV  Viral illness: Relatively broad differential diagnosis for this in setting of HIV status including fungal, viral, and bacterial sources. This may a standard viral illness. Last CD4 count noted approximately 4 months ago was greater than 1000 which is reassuring. Still, very low threshold for clinical work up. Will admit pt with plan for CXR, CBC, CMET, and CD4 count. Will also obtain gold quanteferon, as well as urine strep and legionella. Will consult ID while in house to better assess.  HTN: Continue home meds.  Psych: continue home meds.  FEN/GI: NS @ 125/hr. PRN zofran for nausea.  Prophylaxis: heparin subq Disposition: pending further evaluation.  

## 2011-04-25 NOTE — Progress Notes (Signed)
Family Medicine Teaching [facility] Admission History and Physical  Patient name: Cheryl Thompson Medical record number: [id number] Date of birth: 06/27/1966 Age: 45 y.o. Gender: female  Primary Care Provider: RED, [facility] TEAM, MD  Chief Complaint: viral illness History of Present Illness: Cheryl Thompson is a 45 y.o. year old female presenting with viral illness x5 days. Patient states she noticed generalized viral symptoms started on Saturday of this week. Symptoms include generalized malaise nasal congestion or rhinorrhea cough. Patient denies any true fever however patient does report subjective fevers and chills. Patient also reports decreased by mouth intake would not be able to hold down any solids or liquids for the past 48 hours. From an HIV standpoint patient is currently not on HAART therapy. Patient says she has not seen her infectious disease doctor for over 6 months. Patient unsure of her last CD4 count. Patient does report some neck pain but  denies stiffness. No rash. Mild headache. Cough is nonproductive. Minimal increased work of breathing. No chest pain. No reported seizure activity.  Patient Active Problem List  Diagnoses  . HIV INFECTION  . BIPOLAR AFFECTIVE DISORDER  . SUBSTANCE ABUSE, MULTIPLE  . ESSENTIAL HYPERTENSION, BENIGN  . BACK PAIN, LUMBAR  . SEIZURES, HX OF  . Knee pain, bilateral   Past Medical History: Past Medical History  Diagnosis Date  . HIV (human immunodeficiency virus infection)     Past Surgical History: Past Surgical History  Procedure Date  . Total abdominal hysterectomy w/ bilateral salpingoophorectomy 2006  . Right knee surgery     around 45 years old, for ? chronic dislocation    Social History: History   Social History  . Marital Status: Single    Spouse Name: N/A    Number of Children: N/A  . Years of Education: N/A   Social History Main Topics  . Smoking status: Current Everyday Smoker -- 0.2 packs/day    Types: Cigarettes   . Smokeless tobacco: None   Comment: in process of quitting  . Alcohol Use: 7.0 oz/week    14 drink(s) per week     Drinks a pint of hard alcohol at a time  . Drug Use: 2 per week    Special: Marijuana  . Sexually Active: Yes -- Female partner(s)    Birth Control/ Protection: Other-see comments     High risk in the past/ TAH and BSO   Other Topics Concern  . None   Social History Narrative   Lives with MotherHistory of sexual and physical abuseLong standing substance abuse    Family History: No family history on file.  Allergies: Allergies  Allergen Reactions  . Naproxen   . Penicillins     Current Outpatient Prescriptions  Medication Sig Dispense Refill  . amLODipine (NORVASC) 10 MG tablet Take 10 mg by mouth daily.        . citalopram (CELEXA) 20 MG tablet Take 1 tablet (20 mg total) by mouth daily.  30 tablet  2  . divalproex (DEPAKOTE ER) 500 MG 24 hr tablet Take 1 tablet (500 mg total) by mouth daily.  30 tablet  11  . lisinopril-hydrochlorothiazide (ZESTORETIC) 20-12.5 MG per tablet Take 1 tablet by mouth daily.  30 tablet  11   Review Of Systems: See HPI, otherwise 12 point review of systems was performed and was unremarkable.  Physical Exam: BP 168/106 Pulse 79 Resp Temp 98.5 F (36.9 C) Temp src Oral SpO2 100% Weight 164 lb (74.39 kg) Height 5\' 9"  (  1.753 m)  General: cooperative and fatigued HEENT: PERRLA, extra ocular movement intact, sclera clear, anicteric and + cervical LAD bilaterally  Heart: S1, S2 normal, no murmur, rub or gallop, regular rate and rhythm Lungs: clear to auscultation, no wheezes or rales and unlabored breathing Abdomen: abdomen is soft without significant tenderness, masses, organomegaly or guarding Extremities: extremities normal, atraumatic, no cyanosis or edema Skin:no rashes, no ecchymoses, no petechiae Neurology: normal without focal findings, mental status, speech normal, alert and oriented x3, PERLA and reflexes normal and  symmetric  Labs and Imaging: Pending   Assessment and Plan: Cheryl Thompson is a 45 y.o. year old female presenting with a viral illness in setting of HIV  Viral illness: Relatively broad differential diagnosis for this in setting of HIV status including fungal, viral, and bacterial sources. This may a standard viral illness. Last CD4 count noted approximately 4 months ago was greater than 1000 which is reassuring. Still, very low threshold for clinical work up. Will admit pt with plan for CXR, CBC, CMET, and CD4 count. Will also obtain gold quanteferon, as well as urine strep and legionella. Will consult ID while in house to better assess.  HTN: Continue home meds.  Psych: continue home meds.  FEN/GI: NS @ 125/hr. PRN zofran for nausea.  Prophylaxis: heparin subq Disposition: pending further evaluation.

## 2011-04-26 LAB — COMPREHENSIVE METABOLIC PANEL
ALT: 118 U/L — ABNORMAL HIGH (ref 0–35)
AST: 175 U/L — ABNORMAL HIGH (ref 0–37)
Calcium: 8.7 mg/dL (ref 8.4–10.5)
Sodium: 137 mEq/L (ref 135–145)
Total Protein: 7.5 g/dL (ref 6.0–8.3)

## 2011-04-26 LAB — URINE CULTURE

## 2011-04-26 LAB — CBC
MCH: 31.8 pg (ref 26.0–34.0)
MCHC: 33.6 g/dL (ref 30.0–36.0)
Platelets: 103 10*3/uL — ABNORMAL LOW (ref 150–400)
RBC: 3.49 MIL/uL — ABNORMAL LOW (ref 3.87–5.11)
RDW: 13.8 % (ref 11.5–15.5)

## 2011-04-26 LAB — LEGIONELLA ANTIGEN, URINE

## 2011-04-26 LAB — T-HELPER CELLS (CD4) COUNT (NOT AT ARMC): CD4 % Helper T Cell: 47 % (ref 33–55)

## 2011-04-27 ENCOUNTER — Ambulatory Visit: Payer: Self-pay | Admitting: Family Medicine

## 2011-04-27 LAB — QUANTIFERON TB GOLD ASSAY (BLOOD): Interferon Gamma Release Assay: NEGATIVE

## 2011-04-27 LAB — HEPATITIS PANEL, ACUTE
HCV Ab: REACTIVE — AB
Hep A IgM: NEGATIVE
Hepatitis B Surface Ag: NEGATIVE

## 2011-05-01 NOTE — Discharge Summary (Addendum)
Cheryl Thompson, Cheryl Thompson NO.:  [id number]  MEDICAL RECORD NO.:  [id number]  LOCATION:  2033                         FACILITY:  [facility]  PHYSICIAN:  Pearlean Brownie, M.D.DATE OF BIRTH:  01-Aug-1965  DATE OF ADMISSION:  04/25/2011 DATE OF DISCHARGE:  04/26/2011                              DISCHARGE SUMMARY   PRIMARY CARE PHYSICIAN:  Dr. Alvester Morin at [facility].  CONSULTANT AND CORRESPONDING SPECIALTY:  None.  PROCEDURE RESULTS:  None.  REASON FOR ADMISSION:  URI symptoms including cough, fever, and decreased oral intake, and HIV positive patient with unknown CD4 count.  DISCHARGE DIAGNOSES: 1. Upper respiratory infection likely viral in etiology. 2. Human immunodeficiency virus. 3. Hypertension. 4. Bipolar disorder. 5. History of seizure. 6. History of substance abuse. 7. Elevated liver enzymes.  DISCHARGE MEDICATIONS: 1. Amlodipine 5 mg daily. 2. Zofran 4 mg p.o. q.6 h. as needed. 3. Celexa 20 mg p.o. daily. 4. Lisinopril/HCTZ 20/12.5 p.o. daily. 5. Vitamin D one tablet p.o. daily.  HOSPITAL COURSE: 1. URI symptoms.  Upon admission, the patient was afebrile.  Her vital     signs were stable, however, due to her unknown CD4 count,     laboratory workup and chest x-ray was obtained.  Chest x-ray was     clear.  Urine strep pneumo antigen and Legionella antigen were     negative and CBC revealed normal white blood cell count.  The     patient remained afebrile and simply stable throughout her stay. 2. HIV.  CD4 count during this admission was within normal limits at     930 with a T-helper percent of 47. 3. Hypertension.  The patient was hypertensive upon admission with a     blood pressure of 161/89.  The patient was given her home     medications of amlodipine, lisinopril/HCTZ for blood pressure     control.  Blood pressure is decreased during admission to 125/89 on     the morning of her discharge. 4. History of seizures.  The  patient was initially started on     valproate for seizure prophylaxis.  However, the patient reported     she had been noncompliant with this medication at home and we     discussed discontinuation of this medication prior to discharge. 5. Elevated liver enzymes.  A complete metabolic panel was obtained     upon admission and revealed elevated liver enzymes with a AST of     242 and ALT of 147.  Acute hepatitis panel was ordered and is     pending at this time.  CONDITION AT THE TIME OF DISCHARGE:  Stable.  PENDING TEST RESULTS AT THE TIME OF DISCHARGE:  Quantiferon TB Gold assay, urine culture, and hepatitis acute panel.  DISPOSITION:  The patient to be discharged home.  DISCHARGE FOLLOWUP:  The patient will follow up with Dr. Alvester Morin in [facility] on October 23 at 2:45 p.m.  FOLLOWUP ISSUES:  The patient's pending lab work should be reviewed by her PCP Dr. Alvester Morin.    ______________________________ Everlene Other, MD   ______________________________ Pearlean Brownie, M.D.   JC/MEDQ  D:  04/26/2011  T:  04/26/2011  Job:  [id number]  cc:   Dr. Alvester Morin  Electronically Signed by Pearlean Brownie M.D. on 05/10/2011 02:42:18 PM

## 2011-05-03 ENCOUNTER — Inpatient Hospital Stay: Payer: Self-pay | Admitting: Family Medicine

## 2011-05-07 ENCOUNTER — Emergency Department (HOSPITAL_COMMUNITY): Payer: Self-pay

## 2011-05-07 ENCOUNTER — Emergency Department (HOSPITAL_COMMUNITY)
Admission: EM | Admit: 2011-05-07 | Discharge: 2011-05-07 | Disposition: A | Discharge status: Home / Self Care | Payer: Self-pay | Attending: Emergency Medicine | Admitting: Emergency Medicine

## 2011-05-07 DIAGNOSIS — R070 Pain in throat: Secondary | ICD-10-CM | POA: Insufficient documentation

## 2011-05-07 DIAGNOSIS — R05 Cough: Secondary | ICD-10-CM | POA: Insufficient documentation

## 2011-05-07 DIAGNOSIS — R42 Dizziness and giddiness: Secondary | ICD-10-CM | POA: Insufficient documentation

## 2011-05-07 DIAGNOSIS — R059 Cough, unspecified: Secondary | ICD-10-CM | POA: Insufficient documentation

## 2011-05-07 DIAGNOSIS — F17200 Nicotine dependence, unspecified, uncomplicated: Secondary | ICD-10-CM | POA: Insufficient documentation

## 2011-05-07 DIAGNOSIS — M940 Chondrocostal junction syndrome [Tietze]: Secondary | ICD-10-CM | POA: Insufficient documentation

## 2011-05-07 DIAGNOSIS — J069 Acute upper respiratory infection, unspecified: Secondary | ICD-10-CM | POA: Insufficient documentation

## 2011-05-07 DIAGNOSIS — Z21 Asymptomatic human immunodeficiency virus [HIV] infection status: Secondary | ICD-10-CM | POA: Insufficient documentation

## 2011-05-07 DIAGNOSIS — R071 Chest pain on breathing: Secondary | ICD-10-CM | POA: Insufficient documentation

## 2011-05-07 DIAGNOSIS — G40909 Epilepsy, unspecified, not intractable, without status epilepticus: Secondary | ICD-10-CM | POA: Insufficient documentation

## 2011-05-07 DIAGNOSIS — I10 Essential (primary) hypertension: Secondary | ICD-10-CM | POA: Insufficient documentation

## 2011-05-07 DIAGNOSIS — Z79899 Other long term (current) drug therapy: Secondary | ICD-10-CM | POA: Insufficient documentation

## 2011-05-07 DIAGNOSIS — R079 Chest pain, unspecified: Secondary | ICD-10-CM | POA: Insufficient documentation

## 2011-05-07 DIAGNOSIS — IMO0001 Reserved for inherently not codable concepts without codable children: Secondary | ICD-10-CM | POA: Insufficient documentation

## 2011-05-07 DIAGNOSIS — R6889 Other general symptoms and signs: Secondary | ICD-10-CM | POA: Insufficient documentation

## 2011-05-07 LAB — D-DIMER, QUANTITATIVE: D-Dimer, Quant: <0.22 ug/mL-FEU (ref 0.00–0.48)

## 2011-05-08 ENCOUNTER — Inpatient Hospital Stay: Payer: Self-pay | Admitting: Family Medicine

## 2011-06-14 ENCOUNTER — Emergency Department (HOSPITAL_COMMUNITY)
Admission: EM | Admit: 2011-06-14 | Discharge: 2011-06-14 | Disposition: A | Discharge status: Home / Self Care | Payer: Self-pay | Attending: Emergency Medicine | Admitting: Emergency Medicine

## 2011-06-14 ENCOUNTER — Encounter (HOSPITAL_COMMUNITY): Payer: Self-pay | Admitting: Nurse Practitioner

## 2011-06-14 ENCOUNTER — Emergency Department (HOSPITAL_COMMUNITY): Payer: Self-pay

## 2011-06-14 DIAGNOSIS — M7989 Other specified soft tissue disorders: Secondary | ICD-10-CM

## 2011-06-14 DIAGNOSIS — M25569 Pain in unspecified knee: Secondary | ICD-10-CM | POA: Insufficient documentation

## 2011-06-14 DIAGNOSIS — M79609 Pain in unspecified limb: Secondary | ICD-10-CM

## 2011-06-14 DIAGNOSIS — M25561 Pain in right knee: Secondary | ICD-10-CM

## 2011-06-14 DIAGNOSIS — R569 Unspecified convulsions: Secondary | ICD-10-CM | POA: Insufficient documentation

## 2011-06-14 DIAGNOSIS — Z21 Asymptomatic human immunodeficiency virus [HIV] infection status: Secondary | ICD-10-CM | POA: Insufficient documentation

## 2011-06-14 DIAGNOSIS — M25469 Effusion, unspecified knee: Secondary | ICD-10-CM | POA: Insufficient documentation

## 2011-06-14 DIAGNOSIS — I10 Essential (primary) hypertension: Secondary | ICD-10-CM | POA: Insufficient documentation

## 2011-06-14 HISTORY — DX: Unspecified convulsions: R56.9

## 2011-06-14 HISTORY — DX: Major depressive disorder, single episode, unspecified: F32.9

## 2011-06-14 HISTORY — DX: Essential (primary) hypertension: I10

## 2011-06-14 HISTORY — DX: Depression, unspecified: F32.A

## 2011-06-14 MED ORDER — MORPHINE SULFATE 4 MG/ML IJ SOLN
4.0000 mg | Freq: Once | INTRAMUSCULAR | Status: AC
  Administered 2011-06-14: 4 mg via INTRAMUSCULAR
  Filled 2011-06-14: qty 1

## 2011-06-14 MED ORDER — HYDROCODONE-ACETAMINOPHEN 5-325 MG PO TABS
2.0000 | ORAL_TABLET | Freq: Once | ORAL | Status: AC
  Administered 2011-06-14: 2 via ORAL
  Filled 2011-06-14: qty 2

## 2011-06-14 MED ORDER — MORPHINE SULFATE 4 MG/ML IJ SOLN: 4.0000 mg | Freq: Once | INTRAMUSCULAR | Status: DC

## 2011-06-14 MED ORDER — HYDROCODONE-ACETAMINOPHEN 5-325 MG PO TABS: 2.0000 | ORAL_TABLET | Freq: Four times a day (QID) | ORAL | Status: AC | PRN

## 2011-06-14 NOTE — ED Provider Notes (Signed)
History     CSN: [id number] Arrival date & time: 06/14/2011  9:43 AM   First MD Initiated Contact with Patient 06/14/11 1107      Chief Complaint  Patient presents with  . Leg Pain    (Consider location/radiation/quality/duration/timing/severity/associated sxs/prior treatment) HPI Comments: Patient reports left knee swelling that began approximately 1 week ago. She denies any significant trauma. She denies any recent travel, no chest pain, pleurisy or shortness of breath. No fever or cough. 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 She had surgery on her right knee when she was 45 years old because it "slipping out of place". She reports that she has been elevating it, using ice, taking ibuprofen and Aleve in the swelling and pain continues to get worse. She now has swelling and pain along the entire left thigh and pain that radiates from her calf area although he up to her hip area. She denies any back pain. She has a significant history of hypertension. She reports that she is allergic to Naprosyn due to the blue dye color. But she is able to take Aleve apparently.  Patient is a 45 y.o. female presenting with leg pain. The history is provided by the patient.  Leg Pain  Pertinent negatives include no numbness.    Past Medical History  Diagnosis Date  . HIV (human immunodeficiency virus infection)   . Hypertension   . Seizures   . Depression     Past Surgical History  Procedure Date  . Total abdominal hysterectomy w/ bilateral salpingoophorectomy 2006  . 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 Abdominal hysterectomy     History reviewed. No pertinent family history.  History  Substance Use Topics  . Smoking status: Current Everyday Smoker -- 0.2 packs/day    Types: Cigarettes  . Smokeless tobacco: Not on file   Comment: in process of quitting  . Alcohol Use: 4.0 oz/week    8 drink(s) per week     Drinks a pint of  hard alcohol at a time    OB History    Grav Para Term Preterm Abortions TAB SAB Ect Mult Living                  Review of Systems  Constitutional: Negative for fever and chills.  Respiratory: Negative for cough and shortness of breath.   Cardiovascular: Positive for leg swelling. Negative for chest pain.  Musculoskeletal: Positive for joint swelling and arthralgias. Negative for back pain.  Skin: Negative for color change.  Neurological: Negative for weakness and numbness.    Allergies  Penicillins and Naproxen  Home Medications   Current Outpatient Rx  Name Route Sig Dispense Refill  . AMLODIPINE BESYLATE 10 MG PO TABS Oral Take 5 mg by mouth daily.     Marland Kitchen VITAMIN D 1000 UNITS PO TABS Oral Take 1,000 Units by mouth daily.      Marland Kitchen CITALOPRAM HYDROBROMIDE 20 MG PO TABS Oral Take 20 mg by mouth daily.      Marland Kitchen DIVALPROEX SODIUM 500 MG PO TB24 Oral Take 250 mg by mouth 2 (two) times daily.      Marland Kitchen LISINOPRIL-HYDROCHLOROTHIAZIDE 20-12.5 MG PO TABS Oral Take 1 tablet by mouth daily.      Marland Kitchen NAPROXEN SODIUM 220 MG PO TABS Oral Take 440 mg by mouth 2 (two) times daily as needed. For pain       BP  146/85  Pulse 81  Temp(Src) 98.2 F (36.8 C) (Oral)  Resp 20  Ht 5\' 9"  (1.753 m)  Wt 165 lb (74.844 kg)  BMI 24.37 kg/m2  SpO2 99%  Physical Exam  Constitutional: She appears well-developed and well-nourished.  HENT:  Head: Normocephalic.  Cardiovascular: Normal pulses.   Musculoskeletal:       Legs: Neurological:       Plantar and dorsiflexion of left ankle is normal    ED Course  ARTHOCENTESIS Date/Time: 06/14/2011 5:10 PM Performed by: Lear Ng. Authorized by: Lear Ng Consent: Verbal consent obtained. Risks and benefits: risks, benefits and alternatives were discussed Consent given by: patient Patient understanding: patient states understanding of the procedure being performed Test results: test results available and properly labeled Imaging studies:  imaging studies available Patient identity confirmed: verbally with patient Time out: Immediately prior to procedure a "time out" was called to verify the correct patient, procedure, equipment, support staff and site/side marked as required. Indications: possible septic joint, pain, joint swelling and diagnostic evaluation  Body area: knee Joint: left knee Local anesthesia used: yes Anesthesia: local infiltration Local anesthetic: lidocaine 2% without epinephrine Anesthetic total: 2.5 ml Patient sedated: no Preparation: Patient was prepped and draped in the usual sterile fashion. Needle gauge: 18 G Approach: lateral Aspirate: serous and blood-tinged Aspirate amount: 3 ml Patient tolerance: Patient tolerated the procedure well with no immediate complications. Comments: No purulent drainage, possibly enough to send for culture.     (including critical care time)  Labs Reviewed - No data to display No results found.   No diagnosis found.    MDM   I reviewed left knee films myself.  Per radiologist, + tricompartmental arthritis.  However no overt effusion. With better exam of left lower leg, pt has obviously sig swelling involving left thigh greater than right.  Needs duplex U/S to r/o DVT.  Pt feels a little improved after vicodin.  Will move to CDU, holding.  Care passed to [facility] [place].     3:16 PM  In light of her HIV diagnosis, given her inability to ambulate, bear weight or flex, will perform a knee joint aspiration for diagnosis and possibly therapeutic reasons if DVT U/S is negative.      5:12 PM After arthrocentesis, no sig improvement, but safe to say I think septic knee ruled out as fluid is clear, serous, blood tinged only.  Will refer  To [facility] whom she has seen previously for right knee injections.    Gavin Pound. Oletta Lamas, MD 06/14/11 2130

## 2011-06-14 NOTE — ED Provider Notes (Signed)
3:42 PM Patient is in CDU holding for doppler US of left leg.  Pt reports continued pain in left leg and hip, on exam patient has pain with palpation of either and palpation of thigh, pain with range of motion.  Preliminary reading of [place] is negative.    5:12 PM Discussed patient with Dr Oletta Lamas, who has done aspiration of knee joint revealing serosanguinous fluid.  Plan is for follow up with [facility].  I have called office to attempt to arrange appointment, but office is closed.  Pt is already a patient there.  Plan is for d/c with pain medication, knee immobilizer, crutches, ortho f/u.    Dillard Cannon O'Brien, [place] 06/14/11 [phone]

## 2011-06-14 NOTE — ED Notes (Signed)
ORTHO PAGED FOR CRUTCHES AND KNEE IMMOBILIZER..WILL BRING TO BEDSIDE

## 2011-06-14 NOTE — ED Notes (Signed)
C/o L leg pain and swelling, mostly in knee, onset while cooking a meal on 11/24. Pt reports symptoms persistently worse since onset. Denies any known injuries. Ambulatory

## 2011-06-14 NOTE — Progress Notes (Signed)
Orthopedic Tech Progress Note Patient Details:  Cheryl Thompson 1966-01-18 [id number]  Other Ortho Devices Type of Ortho Device: Crutches;Knee Immobilizer Ortho Device Location: (L) LE Ortho Device Interventions: Application   Jennye Moccasin 06/14/2011, 5:39 PM

## 2011-06-14 NOTE — ED Notes (Signed)
PT MEDICATED FOR PAIN. AWAITING PA TO COME DISCUSS RESULTS OF STUDIES WITH HER.

## 2011-06-14 NOTE — Progress Notes (Signed)
Left lower extremity venous duplex completed. No evidence of DVT, superficial thrombosis, or Baker's cyst  [place], [place] D 06/14/2011, 1:52 PM

## 2011-06-15 ENCOUNTER — Ambulatory Visit: Payer: Self-pay | Admitting: Family Medicine

## 2011-06-15 NOTE — ED Provider Notes (Signed)
All care performed by me up until CDU care by [facility].  Procedure done by myself as well.  Gavin Pound. Oletta Lamas, MD 06/15/11 [phone]

## 2011-06-18 ENCOUNTER — Encounter: Payer: Self-pay | Admitting: Family Medicine

## 2011-06-18 ENCOUNTER — Ambulatory Visit (INDEPENDENT_AMBULATORY_CARE_PROVIDER_SITE_OTHER): Payer: Self-pay | Admitting: Family Medicine

## 2011-06-18 ENCOUNTER — Other Ambulatory Visit: Payer: Self-pay | Admitting: Family Medicine

## 2011-06-18 VITALS — BP 157/92 | HR 65 | Temp 98.0°F | Ht 69.0 in | Wt 165.0 lb

## 2011-06-18 DIAGNOSIS — M25562 Pain in left knee: Secondary | ICD-10-CM

## 2011-06-18 DIAGNOSIS — M25462 Effusion, left knee: Secondary | ICD-10-CM

## 2011-06-18 DIAGNOSIS — M25561 Pain in right knee: Secondary | ICD-10-CM

## 2011-06-18 DIAGNOSIS — M25469 Effusion, unspecified knee: Secondary | ICD-10-CM

## 2011-06-18 DIAGNOSIS — M25569 Pain in unspecified knee: Secondary | ICD-10-CM

## 2011-06-18 LAB — BODY FLUID CULTURE: Culture: NO GROWTH

## 2011-06-18 NOTE — Patient Instructions (Addendum)
You likely flared up the arthritis in your knee causing an effusion. We are sending this for studies to rule out gout, other abnormalities other than arthritis that can cause this. Ice your knee 15 minutes at a time 3-4 times a day. ACE wrap for compression or a knee sleeve. Elevate above the level of your heart as much as possible to help with swelling. Crutches as needed to help you get around. Aleve 2 tablets twice a day with food for pain and inflammation and swelling. Hydrocodone as needed for severe pain. Quad strengthening exercises (quad sets and straight leg raises) - 3 sets of 10 once a day. I will contact you with your lab results. Follow up with [place] as needed otherwise.

## 2011-06-19 ENCOUNTER — Encounter: Payer: Self-pay | Admitting: Family Medicine

## 2011-06-19 DIAGNOSIS — M25462 Effusion, left knee: Secondary | ICD-10-CM | POA: Insufficient documentation

## 2011-06-19 LAB — SYNOVIAL CELL COUNT + DIFF, W/ CRYSTALS
Eosinophils-Synovial: 3 % — ABNORMAL HIGH (ref 0–1)
Lymphocytes-Synovial Fld: 18 % (ref 0–20)
Monocyte/Macrophage: 15 % — ABNORMAL LOW (ref 50–90)
Neutrophil, Synovial: 64 % — ABNORMAL HIGH (ref 0–25)
WBC, Synovial: 790 cu mm — ABNORMAL HIGH (ref 0–200)

## 2011-06-19 NOTE — Progress Notes (Addendum)
Subjective:    Patient ID: Cheryl Thompson, female    DOB: 06-Nov-1965, 45 y.o.   MRN: [id number]  HPI 45 yo F here for left knee pain and swelling.  Patient reports that on Thanksgiving she started having swelling and diffuse pain within left knee. No known injury. No prior problems with this knee. Has worsened since then and went to ED for evaluation. They attempted aspiration but were unable to draw any fluid from her knee. X-rays showed mild-mod tricompartmental DJD. Was put in an immobilizer and given crutches which she has been using. Is elevating and icing. Taking hydrocodone as needed for pain. Difficult to put weight on knee. No fever, night sweats.  Past Medical History  Diagnosis Date  . HIV (human immunodeficiency virus infection)   . Hypertension   . Seizures   . Depression     Current Outpatient Prescriptions on File Prior to Visit  Medication Sig Dispense Refill  . amLODipine (NORVASC) 10 MG tablet Take 5 mg by mouth daily.       . cholecalciferol (VITAMIN D) 1000 UNITS tablet Take 1,000 Units by mouth daily.        . citalopram (CELEXA) 20 MG tablet Take 20 mg by mouth daily.        . divalproex (DEPAKOTE ER) 500 MG 24 hr tablet Take 250 mg by mouth 2 (two) times daily.        Marland Kitchen HYDROcodone-acetaminophen (NORCO) 5-325 MG per tablet Take 2 tablets by mouth every 6 (six) hours as needed for pain.  20 tablet  0  . lisinopril-hydrochlorothiazide (PRINZIDE,ZESTORETIC) 20-12.5 MG per tablet Take 1 tablet by mouth daily.        . naproxen sodium (ANAPROX) 220 MG tablet Take 440 mg by mouth 2 (two) times daily as needed. For pain         Past Surgical History  Procedure Date  . Total abdominal hysterectomy w/ bilateral salpingoophorectomy 2006  . 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 Abdominal hysterectomy     Allergies  Allergen Reactions  . Penicillins Shortness Of Breath and Swelling    History   Social History  .  Marital Status: Single    Spouse Name: N/A    Number of Children: N/A  . Years of Education: N/A   Occupational History  . Not on file.   Social History Main Topics  . Smoking status: Current Everyday Smoker -- 0.2 packs/day    Types: Cigarettes  . Smokeless tobacco: Not on file   Comment: in process of quitting/ 4 cigarettes per day  . Alcohol Use: 4.0 oz/week    8 drink(s) per week     Drinks a pint of hard alcohol at a time  . Drug Use: 2 per week    Special: Marijuana  . Sexually Active: Yes -- Female partner(s)    Birth Control/ Protection: Other-see comments     High risk in the past/ TAH and BSO   Other Topics Concern  . Not on file   Social History Narrative   Lives with MotherHistory of sexual and physical abuseLong standing substance abuse    History reviewed. No pertinent family history.  BP 157/92  Pulse 65  Temp(Src) 98 F (36.7 C) (Oral)  Ht 5\' 9"  (1.753 m)  Wt 165 lb (74.844 kg)  BMI 24.37 kg/m2  Review of Systems See HPI    Objective:   Physical Exam Gen: Uncomfortable  but in NAD.  L knee: Moderate effusion and warmth.  No erythema, bruising. Diffuse anterior TTP. ROM 0 - 90 degrees but painful throughout. Collateral ligaments intact.  Negative ant/post drawers. Unable to adequately perform mcmurrays.  Apleys painful throughout knee without click.  NVI distally.  R knee: FROM without pain, swelling, instability.  MSK u/s: Confirmed effusion suprapatellar region of left knee.    Assessment & Plan:  1. Left knee pain/effusion - Likely 2/2 DJD flare but sending effusion for studies to rule out gout, pseudogout, other abnormalities.  Does not appear to have septic joint based on symptoms and fluid appearance.  Will call her with results.  Aspirated and then injected with 3:1.  Discontinue immobilizer.  Start quad strengthening exercises.  Crutches as needed.  After informed written consent patient was lying supine on exam table.  Left knee was  prepped with iodine and alcohol swab.  Utilizing superolateral approach, 3 mL of marcaine was used for local anesthesia.  Then using an 18g needle on 60cc syringe, 31 mL of serosanguinous fluid was aspirated from right knee.  Knee was then injected with 3:1 marcaine:depomedrol.  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  Called patient and notified her - she does feel better though still sore - advised may take a few more days before she feels even more better but to call me if not and we'll discuss further treatment options.

## 2011-06-19 NOTE — Assessment & Plan Note (Signed)
Likely 2/2 DJD flare but sending effusion for studies to rule out gout, pseudogout, other abnormalities.  Does not appear to have septic joint based on symptoms and fluid appearance.  Will call her with results.  Aspirated and then injected with 3:1.  Discontinue immobilizer.  Start quad strengthening exercises.  Crutches as needed.  After informed written consent patient was lying supine on exam table.  Left knee was prepped with iodine and alcohol swab.  Utilizing superolateral approach, 3 mL of marcaine was used for local anesthesia.  Then using an 18g needle on 60cc syringe, 31 mL of serosanguinous fluid was aspirated from right knee.  Knee was then injected with 3:1 marcaine:depomedrol.  Patient tolerated procedure well without immediate complications

## 2011-06-19 NOTE — Assessment & Plan Note (Signed)
Likely 2/2 DJD flare but sending effusion for studies to rule out gout, pseudogout, other abnormalities.  Does not appear to have septic joint based on symptoms and fluid appearance.  Will call her with results.  Aspirated and then injected with 3:1.  Discontinue immobilizer.  Start quad strengthening exercises.  Crutches as needed.  After informed written consent patient was lying supine on exam table.  Left knee was prepped with iodine and alcohol swab.  Utilizing superolateral approach, 3 mL of marcaine was used for local anesthesia.  Then using an 18g needle on 60cc syringe, 31 mL of serosanguinous fluid was aspirated from right knee.  Knee was then injected with 3:1 marcaine:depomedrol.  Patient tolerated procedure well without immediate complications  

## 2011-06-22 LAB — BODY FLUID CULTURE: Gram Stain: NONE SEEN

## 2011-07-05 ENCOUNTER — Other Ambulatory Visit: Payer: Self-pay | Admitting: *Deleted

## 2011-07-05 MED ORDER — PREDNISONE (PAK) 10 MG PO TABS: 10.0000 mg | ORAL_TABLET | Freq: Every day | ORAL | Status: AC

## 2011-07-18 ENCOUNTER — Ambulatory Visit: Payer: Self-pay | Admitting: Family Medicine

## 2011-08-10 ENCOUNTER — Ambulatory Visit: Payer: Self-pay | Admitting: Family Medicine

## 2011-08-13 ENCOUNTER — Ambulatory Visit: Payer: Self-pay | Admitting: Family Medicine

## 2011-08-31 ENCOUNTER — Telehealth: Payer: Self-pay | Admitting: *Deleted

## 2011-08-31 NOTE — Telephone Encounter (Signed)
Message left for pt to call for an appt.  Will also make a [facility] referral.

## 2011-12-03 ENCOUNTER — Emergency Department (HOSPITAL_COMMUNITY)
Admission: EM | Admit: 2011-12-03 | Discharge: 2011-12-03 | Disposition: A | Discharge status: Home / Self Care | Payer: Self-pay | Attending: Emergency Medicine | Admitting: Emergency Medicine

## 2011-12-03 ENCOUNTER — Encounter (HOSPITAL_COMMUNITY): Payer: Self-pay | Admitting: *Deleted

## 2011-12-03 DIAGNOSIS — J340 Abscess, furuncle and carbuncle of nose: Secondary | ICD-10-CM

## 2011-12-03 DIAGNOSIS — Z21 Asymptomatic human immunodeficiency virus [HIV] infection status: Secondary | ICD-10-CM | POA: Insufficient documentation

## 2011-12-03 DIAGNOSIS — J3489 Other specified disorders of nose and nasal sinuses: Secondary | ICD-10-CM | POA: Insufficient documentation

## 2011-12-03 DIAGNOSIS — I10 Essential (primary) hypertension: Secondary | ICD-10-CM | POA: Insufficient documentation

## 2011-12-03 DIAGNOSIS — R221 Localized swelling, mass and lump, neck: Secondary | ICD-10-CM | POA: Insufficient documentation

## 2011-12-03 DIAGNOSIS — R220 Localized swelling, mass and lump, head: Secondary | ICD-10-CM | POA: Insufficient documentation

## 2011-12-03 MED ORDER — HYDROCODONE-ACETAMINOPHEN 5-325 MG PO TABS: 1.0000 | ORAL_TABLET | ORAL | Status: AC | PRN

## 2011-12-03 MED ORDER — SULFAMETHOXAZOLE-TRIMETHOPRIM 800-160 MG PO TABS: 1.0000 | ORAL_TABLET | Freq: Two times a day (BID) | ORAL | Status: AC

## 2011-12-03 MED ORDER — SULFAMETHOXAZOLE-TMP DS 800-160 MG PO TABS
1.0000 | ORAL_TABLET | Freq: Once | ORAL | Status: AC
  Administered 2011-12-03: 1 via ORAL
  Filled 2011-12-03: qty 1

## 2011-12-03 MED ORDER — HYDROCODONE-ACETAMINOPHEN 5-325 MG PO TABS
1.0000 | ORAL_TABLET | Freq: Once | ORAL | Status: AC
  Administered 2011-12-03: 1 via ORAL
  Filled 2011-12-03: qty 1

## 2011-12-03 NOTE — Discharge Instructions (Signed)
FOLLOW UP WITH YOUR DOCTOR IN 2 DAYS FOR RECHECK. WARM COMPRESSES TO THE FACIAL SWELLING. TAKE MEDICATIONS AS PRESCRIBED. RETURN HERE WITH ANY HIGH FEVER, SEVERE PAIN OR NEW CONCERN.  Abscess An abscess (boil or furuncle) is an infected area that contains a collection of pus.  SYMPTOMS Signs and symptoms of an abscess include pain, tenderness, redness, or hardness. You may feel a moveable soft area under your skin. An abscess can occur anywhere in the body.  TREATMENT  A surgical cut (incision) may be made over your abscess to drain the pus. Gauze may be packed into the space or a drain may be looped through the abscess cavity (pocket). This provides a drain that will allow the cavity to heal from the inside outwards. The abscess may be painful for a few days, but should feel much better if it was drained.  Your abscess, if seen early, may not have localized and may not have been drained. If not, another appointment may be required if it does not get better on its own or with medications. HOME CARE INSTRUCTIONS   Only take over-the-counter or prescription medicines for pain, discomfort, or fever as directed by your caregiver.   Take your antibiotics as directed if they were prescribed. Finish them even if you start to feel better.   Keep the skin and clothes clean around your abscess.   If the abscess was drained, you will need to use gauze dressing to collect any draining pus. Dressings will typically need to be changed 3 or more times a day.   The infection may spread by skin contact with others. Avoid skin contact as much as possible.   Practice good hygiene. This includes regular hand washing, cover any draining skin lesions, and do not share personal care items.   If you participate in sports, do not share athletic equipment, towels, whirlpools, or personal care items. Shower after every practice or tournament.   If a draining area cannot be adequately covered:   Do not participate in  sports.   Children should not participate in day care until the wound has healed or drainage stops.   If your caregiver has given you a follow-up appointment, it is very important to keep that appointment. Not keeping the appointment could result in a much worse infection, chronic or permanent injury, pain, and disability. If there is any problem keeping the appointment, you must call back to this facility for assistance.  SEEK MEDICAL CARE IF:   You develop increased pain, swelling, redness, drainage, or bleeding in the wound site.   You develop signs of generalized infection including muscle aches, chills, fever, or a general ill feeling.   You have an oral temperature above 102 F (38.9 C).  MAKE SURE YOU:   Understand these instructions.   Will watch your condition.   Will get help right away if you are not doing well or get worse.  Document Released: 04/11/2005 Document Revised: 06/21/2011 Document Reviewed: 02/03/2008 [facility] Patient Information 2012 [place], [place].

## 2011-12-03 NOTE — ED Notes (Signed)
Pt states that she awoke with both cheeks puffy, a feeling that something is in her left ear, and a runny nose. Pt states that it burns when she inhales. Pt denies dental problems.

## 2011-12-03 NOTE — ED Notes (Signed)
To ED for eval of left side face swelling. Left ear pain also. States she woke this way this am. No redness or swelling noted to throat. Left sinus and cheek area painful to palpation.

## 2011-12-03 NOTE — ED Provider Notes (Signed)
Medical screening examination/treatment/procedure(s) were performed by non-physician practitioner and as supervising physician I was immediately available for consultation/collaboration.   Antoinetta Berrones, MD 12/03/11 1615 

## 2011-12-03 NOTE — ED Provider Notes (Signed)
History     CSN: [id number]  Arrival date & time 12/03/11  3086   First MD Initiated Contact with Patient 12/03/11 1044      Chief Complaint  Patient presents with  . Facial Swelling    (Consider location/radiation/quality/duration/timing/severity/associated sxs/prior treatment) Patient is a 46 y.o. female presenting with abscess. The history is provided by the patient.  Abscess  This is a new problem. The current episode started less than one week ago. The problem occurs continuously. The problem has been gradually worsening. The abscess is present on the face. The problem is moderate. Pertinent negatives include no fever, no congestion and no sore throat. Associated symptoms comments: Left sided facial swelling without fever, dental pain/injury, congestion, eye pain. She reports having a nosebleed yesterday. . Recent Medical Care: She has been diagnosed with HIV but not on antiviral therapy.    Past Medical History  Diagnosis Date  . HIV (human immunodeficiency virus infection)   . Hypertension   . Seizures   . Depression     Past Surgical History  Procedure Date  . Total abdominal hysterectomy w/ bilateral salpingoophorectomy 2006  . 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 Abdominal hysterectomy     History reviewed. No pertinent family history.  History  Substance Use Topics  . Smoking status: Current Everyday Smoker -- 0.2 packs/day    Types: Cigarettes  . Smokeless tobacco: Not on file   Comment: in process of quitting/ 4 cigarettes per day  . Alcohol Use: 4.0 oz/week    8 drink(s) per week     Drinks a pint of hard alcohol at a time    OB History    Grav Para Term Preterm Abortions TAB SAB Ect Mult Living                  Review of Systems  Constitutional: Negative for fever.  HENT: Positive for ear pain and facial swelling. Negative for congestion, sore throat, trouble swallowing, neck pain, dental problem and sinus  pressure.        See HPI.  Gastrointestinal: Negative for nausea.  Skin: Negative for rash.    Allergies  Penicillins and Naproxen  Home Medications   Current Outpatient Rx  Name Route Sig Dispense Refill  . AMLODIPINE BESYLATE 10 MG PO TABS Oral Take 5 mg by mouth daily.    Marland Kitchen VITAMIN D 1000 UNITS PO TABS Oral Take 1,000 Units by mouth daily.      Marland Kitchen CITALOPRAM HYDROBROMIDE 20 MG PO TABS Oral Take 20 mg by mouth daily.    Marland Kitchen DIVALPROEX SODIUM ER 250 MG PO TB24 Oral Take 250 mg by mouth 2 (two) times daily.    Marland Kitchen LISINOPRIL-HYDROCHLOROTHIAZIDE 20-12.5 MG PO TABS Oral Take 1 tablet by mouth daily.      BP 173/103  Pulse 74  Temp(Src) 98.3 F (36.8 C) (Oral)  Resp 20  SpO2 100%  Physical Exam  Constitutional: She appears well-developed and well-nourished. No distress.  HENT:       Left facial swelling originating at left nare with marked mucosal swelling on medial aspect. No pointing abscess, however, there is an area of minimal purulence with adjacent redness and swelling c/w abscess. Swelling extends to left infraorbital and cheek without significant redness and no induration. Good dentition. Left TM clear.  Eyes: Conjunctivae are normal. Pupils are equal, round, and reactive to light.  Neck: Normal range of motion.  Lymphadenopathy:  She has no cervical adenopathy.    ED Course  Procedures (including critical care time)  Labs Reviewed - No data to display No results found.   No diagnosis found.  1. Nasal abscess   MDM  No fever, no drainable site. Feel oral antibiotics are appropriate, pain medication. She is a patient of [facility] and can be rechecked in 2 days.        Rodena Medin, PA-C 12/03/11 1128

## 2011-12-05 ENCOUNTER — Ambulatory Visit (INDEPENDENT_AMBULATORY_CARE_PROVIDER_SITE_OTHER): Payer: Self-pay | Admitting: Family Medicine

## 2011-12-05 ENCOUNTER — Encounter: Payer: Self-pay | Admitting: Family Medicine

## 2011-12-05 VITALS — BP 147/95 | HR 71 | Temp 98.1°F | Ht 69.0 in | Wt 150.3 lb

## 2011-12-05 DIAGNOSIS — I10 Essential (primary) hypertension: Secondary | ICD-10-CM

## 2011-12-05 DIAGNOSIS — J340 Abscess, furuncle and carbuncle of nose: Secondary | ICD-10-CM

## 2011-12-05 DIAGNOSIS — J3489 Other specified disorders of nose and nasal sinuses: Secondary | ICD-10-CM

## 2011-12-05 DIAGNOSIS — L299 Pruritus, unspecified: Secondary | ICD-10-CM

## 2011-12-05 MED ORDER — DIVALPROEX SODIUM ER 250 MG PO TB24: 250.0000 mg | ORAL_TABLET | Freq: Two times a day (BID) | ORAL | Status: DC

## 2011-12-05 NOTE — Assessment & Plan Note (Signed)
Strongly encouraged patient to get medications fill and start taking them.  She agrees to do so.

## 2011-12-05 NOTE — Assessment & Plan Note (Signed)
Diagnosed 12/03/11 in ER.  Improved significantly with Bactrim.  Advised her to finish the course of antibiotics.

## 2011-12-05 NOTE — Progress Notes (Signed)
  Subjective:    Patient ID: Cheryl Thompson, female    DOB: 1966/01/02, 46 y.o.   MRN: [id number]  HPI  [name] comes in for follow up after an ER visit for a nasal cellulitis/possible small abscess.  The ER prescribed her a course of bactrim and Vicodin for pain.  She says the pain is much better- and she did not have to take a pain pill this am.  She says the swelling is also much improved, and the redness is gone.  Denies fevers/chills/purulent drainage from nose.   She says that she initially thought that this was all from her allergies but then work up with the swelling and pain.  She says she is having itching all over her body, as well as some nasal congestion.  She is not taking an H-1 blocker, but says that she takes an occasional benadryl which helps but makes her too sleepy.   The patient had a very elevated BP in the ER (170's/90's), and it is still elevated today.  She says she has not been taking her BP medications for about a month because she ran out of money to buy them.  She says her mother is going to help her pay for them and she is going to the health department to see if they have a medication assistance program too.  She promises to get the medications this week. She denies chest pain/palpitations/dyspnea.   Seizure- she says she has not had a seizure recently, but needs a refill on her Depakote.   Past Medical History  Diagnosis Date  . HIV (human immunodeficiency virus infection)   . Hypertension   . Seizures   . Depression    History  Substance Use Topics  . Smoking status: Current Everyday Smoker -- 0.2 packs/day    Types: Cigarettes  . Smokeless tobacco: Not on file   Comment: in process of quitting/ 4 cigarettes per day  . Alcohol Use: 4.0 oz/week    8 drink(s) per week     Drinks a pint of hard alcohol at a time    Review of Systems Pertinent items in HPI.     Objective:   Physical Exam  Vitals reviewed. Constitutional: She appears well-developed  and well-nourished. No distress.  HENT:  Head: Normocephalic and atraumatic.  Nose: Rhinorrhea present.  Mouth/Throat: Uvula is midline, oropharynx is clear and moist and mucous membranes are normal.       Patient has dark circle under left eye with minimal swelling.  She has mild tenderness to palpation over zygomatic process and nasal bone, but no fluctuance or crepitus or erythema.   Cardiovascular: Normal rate, regular rhythm and normal heart sounds.   Pulmonary/Chest: Effort normal and breath sounds normal.  Skin: Skin is warm. No rash noted.          Assessment & Plan:

## 2011-12-05 NOTE — Assessment & Plan Note (Signed)
Generalized.  This may be allergies, but it may be from vicodin.  Advised to take H-1 blocker daily and follow up with PCP if not improved.

## 2011-12-05 NOTE — Patient Instructions (Addendum)
It was nice to meet you.  Your nose is getting better, but please finish your course of antibiotics.  For your nasal congestion and itching, I think you should start taking a allergy medication- Allegra, Zyrtec, and Claritin are the brand names, but you can get any of them generic over the counter at the pharmacy.   Please be sure to get your BP medications filled, Your blood pressure today was BP: 147/95 mmHg.  Remember your goal blood pressure is about 130/80.  Please be sure to take your medication every day.

## 2011-12-06 ENCOUNTER — Other Ambulatory Visit: Payer: Self-pay | Admitting: Family Medicine

## 2011-12-06 MED ORDER — LISINOPRIL-HYDROCHLOROTHIAZIDE 20-12.5 MG PO TABS: 1.0000 | ORAL_TABLET | Freq: Every day | ORAL | Status: DC

## 2011-12-06 NOTE — Telephone Encounter (Signed)
The refill for Lisinopril did not make it to [facility] on [address].

## 2011-12-06 NOTE — Telephone Encounter (Signed)
Rx sent 

## 2012-01-04 ENCOUNTER — Ambulatory Visit: Payer: Self-pay | Admitting: Family Medicine

## 2012-01-07 ENCOUNTER — Ambulatory Visit: Payer: Self-pay | Admitting: Family Medicine

## 2012-01-09 ENCOUNTER — Emergency Department (HOSPITAL_COMMUNITY)
Admission: EM | Admit: 2012-01-09 | Discharge: 2012-01-09 | Disposition: A | Discharge status: Home / Self Care | Payer: Self-pay | Attending: Emergency Medicine | Admitting: Emergency Medicine

## 2012-01-09 ENCOUNTER — Encounter (HOSPITAL_COMMUNITY): Payer: Self-pay | Admitting: Family Medicine

## 2012-01-09 DIAGNOSIS — F17200 Nicotine dependence, unspecified, uncomplicated: Secondary | ICD-10-CM | POA: Insufficient documentation

## 2012-01-09 DIAGNOSIS — X500XXA Overexertion from strenuous movement or load, initial encounter: Secondary | ICD-10-CM | POA: Insufficient documentation

## 2012-01-09 DIAGNOSIS — Y998 Other external cause status: Secondary | ICD-10-CM | POA: Insufficient documentation

## 2012-01-09 DIAGNOSIS — T148XXA Other injury of unspecified body region, initial encounter: Secondary | ICD-10-CM

## 2012-01-09 DIAGNOSIS — IMO0002 Reserved for concepts with insufficient information to code with codable children: Secondary | ICD-10-CM | POA: Insufficient documentation

## 2012-01-09 DIAGNOSIS — Z21 Asymptomatic human immunodeficiency virus [HIV] infection status: Secondary | ICD-10-CM | POA: Insufficient documentation

## 2012-01-09 DIAGNOSIS — Y9389 Activity, other specified: Secondary | ICD-10-CM | POA: Insufficient documentation

## 2012-01-09 DIAGNOSIS — I10 Essential (primary) hypertension: Secondary | ICD-10-CM | POA: Insufficient documentation

## 2012-01-09 MED ORDER — HYDROCODONE-ACETAMINOPHEN 5-325 MG PO TABS: 1.0000 | ORAL_TABLET | ORAL | Status: AC | PRN

## 2012-01-09 MED ORDER — HYDROCODONE-ACETAMINOPHEN 5-325 MG PO TABS
2.0000 | ORAL_TABLET | Freq: Once | ORAL | Status: AC
  Administered 2012-01-09: 2 via ORAL
  Filled 2012-01-09: qty 2

## 2012-01-09 MED ORDER — CYCLOBENZAPRINE HCL 10 MG PO TABS: 10.0000 mg | ORAL_TABLET | Freq: Two times a day (BID) | ORAL | Status: AC | PRN

## 2012-01-09 NOTE — Discharge Instructions (Signed)
YOUR INJURIES ARE THE RESULT OF MUSCULAR STRAIN. TAKE MEDICATIONS FOR PAIN AS NEEDED AND FOLLOW UP WITH YOUR DOCTOR AT [facility] FOR RECHECK THIS WEEK.   Sprain A sprain happens when the bands of tissue that connect bones and hold joints together (ligaments) stretch too much or tear. HOME CARE  Raise (elevate) the injured area to lessen puffiness (swelling).   Put ice on the injured area.   Put ice in a plastic bag.   Place a towel between your skin and the bag.   Leave the ice on for 15 to 20 minutes, 3 to 4 times a day.   Do this for the first 24 hours or as told by your doctor.   Wear any splints, braces, castings, or elastic wraps as told by your child's doctor.   Eat healthy foods.   Only take medicine as told by your doctor.  GET HELP RIGHT AWAY IF:   There is numbness or tingling in the injured limb.   The toes or fingers become blue or white in the injured limb.   The sprained limb is cold to the touch.   There is a sharp, shooting pain in the injured limb.   The puffiness is getting worse instead of better.  MAKE SURE YOU:   Understand these instructions.   Will watch this condition.   Will get help right away if you are not doing well or get worse.  Document Released: 12/19/2007 Document Revised: 06/21/2011 Document Reviewed: 05/18/2009 [facility] Patient Information 2012 [place], [place].

## 2012-01-09 NOTE — ED Provider Notes (Signed)
History     CSN: [id number]  Arrival date & time 01/09/12  1137   First MD Initiated Contact with Patient 01/09/12 1254      Chief Complaint  Patient presents with  . Back Pain  . Neck Pain  . Chest Pain    (Consider location/radiation/quality/duration/timing/severity/associated sxs/prior treatment) Patient is a 46 y.o. female presenting with back pain, neck pain, and chest pain. The history is provided by the patient.  Back Pain  This is a new problem. The pain is associated with lifting heavy objects. Associated symptoms include chest pain. Associated symptoms comments: She reports she was moving furniture last night and today by pushing and pulling. She now complains of pain in her paracervical neck, greater on the right; upper back, across chest but greater on right; and lower back. No fever or cough. She reports that it hurts her muscles to breathe but denies shortness of breath. No abdominal pain, dysuria, headache, fall or direct trauma..  Neck Pain  Associated symptoms include chest pain.  Chest Pain Pertinent negatives for primary symptoms include no shortness of breath and no cough.     Past Medical History  Diagnosis Date  . HIV (human immunodeficiency virus infection)   . Hypertension   . Seizures   . Depression     Past Surgical History  Procedure Date  . Total abdominal hysterectomy w/ bilateral salpingoophorectomy 2006  . 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 Abdominal hysterectomy     History reviewed. No pertinent family history.  History  Substance Use Topics  . Smoking status: Current Everyday Smoker -- 0.2 packs/day    Types: Cigarettes  . Smokeless tobacco: Not on file   Comment: in process of quitting/ 4 cigarettes per day  . Alcohol Use: 4.0 oz/week    8 drink(s) per week     Drinks a pint of hard alcohol at a time    OB History    Grav Para Term Preterm Abortions TAB SAB Ect Mult Living                  Review of Systems  HENT: Positive for neck pain.   Respiratory: Negative for cough and shortness of breath.   Cardiovascular: Positive for chest pain.  Musculoskeletal: Positive for back pain.    Allergies  Penicillins and Naproxen  Home Medications   Current Outpatient Rx  Name Route Sig Dispense Refill  . AMLODIPINE BESYLATE 10 MG PO TABS Oral Take 5 mg by mouth daily.    Marland Kitchen VITAMIN D 1000 UNITS PO TABS Oral Take 1,000 Units by mouth daily.      Marland Kitchen CITALOPRAM HYDROBROMIDE 20 MG PO TABS Oral Take 20 mg by mouth daily.    Marland Kitchen DIVALPROEX SODIUM ER 250 MG PO TB24 Oral Take 250 mg by mouth 2 (two) times daily.    . IBUPROFEN 200 MG PO TABS Oral Take 800 mg by mouth every 6 (six) hours as needed. For pain    . LISINOPRIL-HYDROCHLOROTHIAZIDE 20-12.5 MG PO TABS Oral Take 1 tablet by mouth daily.      BP 128/89  Pulse 70  Temp 97.5 F (36.4 C)  Resp 16  SpO2 100%  Physical Exam  Constitutional: She is oriented to person, place, and time. She appears well-developed and well-nourished. No distress.  HENT:  Head: Normocephalic.  Cardiovascular: Normal rate.   No murmur heard. Pulmonary/Chest: Effort normal and breath sounds normal. She  has no wheezes. She has no rales. She exhibits tenderness.  Abdominal: Soft. There is no tenderness.  Musculoskeletal:       Tenderness right greater than left paracervical spine. No palpable spasm. FROM bilateral upper extremities with pain on movement. No swelling. Lumbar tenderness bilaterally; no swelling. Ambulatory without difficulty. Parascapular tenderness bilaterally.  Neurological: She is alert and oriented to person, place, and time.  Skin: Skin is warm and dry.  Psychiatric: She has a normal mood and affect.    ED Course  Procedures (including critical care time)  Labs Reviewed - No data to display No results found.   No diagnosis found.  1. Muscle strain   MDM  Pain follows musculoskeletal pattern. VSS. Will treat  symptomatically with muscle relaxers and pain medication. Refer to PCP for recheck.        Rodena Medin, PA-C 01/09/12 1501

## 2012-01-09 NOTE — ED Notes (Addendum)
Pt complaining up upper back, neck and chest pain that started after lifting a dresser yesterday. sts also numbness and tingling in upper extremeties

## 2012-01-09 NOTE — ED Notes (Signed)
Discharged home with written and verbal instructions.   

## 2012-01-10 NOTE — ED Provider Notes (Signed)
Medical screening examination/treatment/procedure(s) were performed by non-physician practitioner and as supervising physician I was immediately available for consultation/collaboration.   Benny Lennert, MD 01/10/12 1929

## 2012-01-19 ENCOUNTER — Emergency Department (HOSPITAL_COMMUNITY)
Admission: EM | Admit: 2012-01-19 | Discharge: 2012-01-19 | Disposition: A | Discharge status: Home / Self Care | Payer: No Typology Code available for payment source | Attending: Emergency Medicine | Admitting: Emergency Medicine

## 2012-01-19 ENCOUNTER — Emergency Department (HOSPITAL_COMMUNITY): Payer: No Typology Code available for payment source

## 2012-01-19 ENCOUNTER — Encounter (HOSPITAL_COMMUNITY): Payer: Self-pay

## 2012-01-19 DIAGNOSIS — Z21 Asymptomatic human immunodeficiency virus [HIV] infection status: Secondary | ICD-10-CM | POA: Insufficient documentation

## 2012-01-19 DIAGNOSIS — H5789 Other specified disorders of eye and adnexa: Secondary | ICD-10-CM | POA: Insufficient documentation

## 2012-01-19 DIAGNOSIS — R221 Localized swelling, mass and lump, neck: Secondary | ICD-10-CM | POA: Insufficient documentation

## 2012-01-19 DIAGNOSIS — I10 Essential (primary) hypertension: Secondary | ICD-10-CM | POA: Insufficient documentation

## 2012-01-19 DIAGNOSIS — R220 Localized swelling, mass and lump, head: Secondary | ICD-10-CM | POA: Insufficient documentation

## 2012-01-19 DIAGNOSIS — F329 Major depressive disorder, single episode, unspecified: Secondary | ICD-10-CM | POA: Insufficient documentation

## 2012-01-19 DIAGNOSIS — G40909 Epilepsy, unspecified, not intractable, without status epilepticus: Secondary | ICD-10-CM | POA: Insufficient documentation

## 2012-01-19 DIAGNOSIS — F3289 Other specified depressive episodes: Secondary | ICD-10-CM | POA: Insufficient documentation

## 2012-01-19 DIAGNOSIS — IMO0001 Reserved for inherently not codable concepts without codable children: Secondary | ICD-10-CM | POA: Insufficient documentation

## 2012-01-19 DIAGNOSIS — M542 Cervicalgia: Secondary | ICD-10-CM | POA: Insufficient documentation

## 2012-01-19 DIAGNOSIS — R209 Unspecified disturbances of skin sensation: Secondary | ICD-10-CM | POA: Insufficient documentation

## 2012-01-19 DIAGNOSIS — R51 Headache: Secondary | ICD-10-CM | POA: Insufficient documentation

## 2012-01-19 DIAGNOSIS — M7918 Myalgia, other site: Secondary | ICD-10-CM

## 2012-01-19 DIAGNOSIS — Z79899 Other long term (current) drug therapy: Secondary | ICD-10-CM | POA: Insufficient documentation

## 2012-01-19 LAB — URINALYSIS, ROUTINE W REFLEX MICROSCOPIC
Glucose, UA: NEGATIVE mg/dL
Protein, ur: NEGATIVE mg/dL
Specific Gravity, Urine: 1.027 (ref 1.005–1.030)
Urobilinogen, UA: 0.2 mg/dL (ref 0.0–1.0)

## 2012-01-19 LAB — URINE MICROSCOPIC-ADD ON

## 2012-01-19 MED ORDER — ONDANSETRON 8 MG PO TBDP
ORAL_TABLET | ORAL | Status: AC
  Filled 2012-01-19: qty 1

## 2012-01-19 MED ORDER — TRAMADOL HCL 50 MG PO TABS: 50.0000 mg | ORAL_TABLET | Freq: Four times a day (QID) | ORAL | Status: AC | PRN

## 2012-01-19 MED ORDER — ONDANSETRON 8 MG PO TBDP
8.0000 mg | ORAL_TABLET | Freq: Once | ORAL | Status: AC
  Administered 2012-01-19: 8 mg via ORAL

## 2012-01-19 MED ORDER — SULFAMETHOXAZOLE-TRIMETHOPRIM 800-160 MG PO TABS: 1.0000 | ORAL_TABLET | Freq: Two times a day (BID) | ORAL | Status: AC

## 2012-01-19 MED ORDER — OXYCODONE-ACETAMINOPHEN 5-325 MG PO TABS
2.0000 | ORAL_TABLET | Freq: Once | ORAL | Status: AC
  Administered 2012-01-19: 2 via ORAL
  Filled 2012-01-19: qty 2
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
## 2012-01-19 NOTE — ED Provider Notes (Signed)
Medical screening examination/treatment/procedure(s) were performed by non-physician practitioner and as supervising physician I was immediately available for consultation/collaboration.  Ethelda Chick, MD 01/19/12 1235
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
## 2012-01-20 LAB — URINE CULTURE

## 2012-01-22 ENCOUNTER — Ambulatory Visit: Payer: Self-pay | Admitting: Family Medicine

## 2012-01-30 ENCOUNTER — Ambulatory Visit: Payer: Self-pay | Admitting: Family Medicine

## 2012-01-31 ENCOUNTER — Ambulatory Visit (INDEPENDENT_AMBULATORY_CARE_PROVIDER_SITE_OTHER): Payer: Self-pay | Admitting: Family Medicine

## 2012-01-31 ENCOUNTER — Encounter: Payer: Self-pay | Admitting: Family Medicine

## 2012-01-31 VITALS — BP 147/97 | HR 72 | Temp 98.1°F | Ht 69.0 in | Wt 147.7 lb

## 2012-01-31 DIAGNOSIS — R300 Dysuria: Secondary | ICD-10-CM

## 2012-01-31 DIAGNOSIS — N898 Other specified noninflammatory disorders of vagina: Secondary | ICD-10-CM

## 2012-01-31 DIAGNOSIS — M545 Low back pain, unspecified: Secondary | ICD-10-CM

## 2012-01-31 DIAGNOSIS — N939 Abnormal uterine and vaginal bleeding, unspecified: Secondary | ICD-10-CM

## 2012-01-31 DIAGNOSIS — N760 Acute vaginitis: Secondary | ICD-10-CM

## 2012-01-31 LAB — POCT URINALYSIS DIPSTICK
Bilirubin, UA: NEGATIVE
Glucose, UA: NEGATIVE
Nitrite, UA: NEGATIVE
Urobilinogen, UA: 0.2

## 2012-01-31 LAB — POCT WET PREP (WET MOUNT)

## 2012-01-31 LAB — POCT UA - MICROSCOPIC ONLY

## 2012-01-31 MED ORDER — MELOXICAM 15 MG PO TABS: 15.0000 mg | ORAL_TABLET | Freq: Every day | ORAL | Status: DC

## 2012-01-31 MED ORDER — CYCLOBENZAPRINE HCL 10 MG PO TABS: 10.0000 mg | ORAL_TABLET | Freq: Three times a day (TID) | ORAL | Status: AC | PRN

## 2012-01-31 NOTE — Assessment & Plan Note (Addendum)
No evidence of neurological compromise.  Will rx limited supply of flexeril and meloxicam.  Offered physical therapy, patient will instead continue with her chiropractic care.  Discussed red flags for return
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
## 2012-01-31 NOTE — Patient Instructions (Addendum)
meloxicam for pain  Flexeril for muscle spasm  If you have worsening pain, numbness, tingling, weakness, please seek medical care

## 2012-01-31 NOTE — Assessment & Plan Note (Signed)
No evidence of uti or external irritation.  If continues, will follow-p with PCP

## 2012-01-31 NOTE — Assessment & Plan Note (Addendum)
No evidence of bleeding.  Wet prep shows neg whiff, some clue cells, patient did not endorse symptoms of BV.  Unclear etiology but unlikely to be vaginal in origin.  Suspect patient report not physiologic, but due to secondary gain.  Patient to return if recurs.
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
## 2012-02-14 ENCOUNTER — Ambulatory Visit: Payer: Self-pay | Admitting: Family Medicine

## 2012-02-21 ENCOUNTER — Encounter: Payer: Self-pay | Admitting: Family Medicine

## 2012-02-21 ENCOUNTER — Ambulatory Visit (INDEPENDENT_AMBULATORY_CARE_PROVIDER_SITE_OTHER): Payer: Self-pay | Admitting: Family Medicine

## 2012-02-21 VITALS — BP 150/92 | HR 74 | Temp 98.0°F | Ht 69.0 in | Wt 155.6 lb

## 2012-02-21 DIAGNOSIS — N898 Other specified noninflammatory disorders of vagina: Secondary | ICD-10-CM

## 2012-02-21 DIAGNOSIS — N939 Abnormal uterine and vaginal bleeding, unspecified: Secondary | ICD-10-CM

## 2012-02-21 DIAGNOSIS — M545 Low back pain, unspecified: Secondary | ICD-10-CM

## 2012-02-21 DIAGNOSIS — J329 Chronic sinusitis, unspecified: Secondary | ICD-10-CM

## 2012-02-21 LAB — CBC WITH DIFFERENTIAL/PLATELET
Basophils Absolute: 0 10*3/uL (ref 0.0–0.1)
Basophils Relative: 0 % (ref 0–1)
MCHC: 33.1 g/dL (ref 30.0–36.0)
Monocytes Absolute: 0.6 10*3/uL (ref 0.1–1.0)
Neutro Abs: 4.7 10*3/uL (ref 1.7–7.7)
Neutrophils Relative %: 63 % (ref 43–77)
Platelets: 184 10*3/uL (ref 150–400)
RDW: 13.7 % (ref 11.5–15.5)

## 2012-02-21 MED ORDER — AMOXICILLIN-POT CLAVULANATE 875-125 MG PO TABS: 1.0000 | ORAL_TABLET | Freq: Two times a day (BID) | ORAL | Status: AC

## 2012-02-21 NOTE — Assessment & Plan Note (Signed)
No red flags on exam or history.  Patient has not yet filled mobic, which is somewhat interesting seeing that she sees a chiropractor 3-4 times per week.  Have encouraged her to fill the mobic and consider massage therapy in addition to seeing her chiropractor.

## 2012-02-21 NOTE — Patient Instructions (Signed)
I would like you to take the antibiotic I am giving you for 10 days.  This is for your sinuses. If you are still having problems with bleeding in another month or two we will repeat your exam. I am going to get a blood count to make certain that you are not anemic.

## 2012-02-21 NOTE — Assessment & Plan Note (Signed)
Am not entirely certain what to make of this complaint.  Will check CBC to ensure this is stable and plan to repeat exam in 2-3 months if it persists.  Previous exam demonstrated no abnormalities about 2.5 weeks ago so the chances of anything having developed since then is minimal.
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
## 2012-02-21 NOTE — Assessment & Plan Note (Signed)
With facial tenderness and a bad odor and a history of nasal cellulitis it is possible the patient has a subacute sinusitis. We'll treat for 10 days with Augmentin.

## 2012-02-25 ENCOUNTER — Encounter: Payer: Self-pay | Admitting: Family Medicine

## 2012-03-06 ENCOUNTER — Emergency Department (HOSPITAL_COMMUNITY)
Admission: EM | Admit: 2012-03-06 | Discharge: 2012-03-06 | Disposition: A | Discharge status: Home / Self Care | Payer: Self-pay | Attending: Emergency Medicine | Admitting: Emergency Medicine

## 2012-03-06 ENCOUNTER — Emergency Department (HOSPITAL_COMMUNITY): Payer: Self-pay

## 2012-03-06 ENCOUNTER — Encounter (HOSPITAL_COMMUNITY): Payer: Self-pay | Admitting: *Deleted

## 2012-03-06 DIAGNOSIS — S93409A Sprain of unspecified ligament of unspecified ankle, initial encounter: Secondary | ICD-10-CM | POA: Insufficient documentation

## 2012-03-06 DIAGNOSIS — W1789XA Other fall from one level to another, initial encounter: Secondary | ICD-10-CM | POA: Insufficient documentation

## 2012-03-06 DIAGNOSIS — F3289 Other specified depressive episodes: Secondary | ICD-10-CM | POA: Insufficient documentation

## 2012-03-06 DIAGNOSIS — Z21 Asymptomatic human immunodeficiency virus [HIV] infection status: Secondary | ICD-10-CM | POA: Insufficient documentation

## 2012-03-06 DIAGNOSIS — Z79899 Other long term (current) drug therapy: Secondary | ICD-10-CM | POA: Insufficient documentation

## 2012-03-06 DIAGNOSIS — F329 Major depressive disorder, single episode, unspecified: Secondary | ICD-10-CM | POA: Insufficient documentation

## 2012-03-06 DIAGNOSIS — F17200 Nicotine dependence, unspecified, uncomplicated: Secondary | ICD-10-CM | POA: Insufficient documentation

## 2012-03-06 DIAGNOSIS — Y92009 Unspecified place in unspecified non-institutional (private) residence as the place of occurrence of the external cause: Secondary | ICD-10-CM | POA: Insufficient documentation

## 2012-03-06 DIAGNOSIS — I10 Essential (primary) hypertension: Secondary | ICD-10-CM | POA: Insufficient documentation

## 2012-03-06 MED ORDER — IBUPROFEN 800 MG PO TABS: 800.0000 mg | ORAL_TABLET | Freq: Three times a day (TID) | ORAL | Status: AC

## 2012-03-06 MED ORDER — IBUPROFEN 400 MG PO TABS
800.0000 mg | ORAL_TABLET | Freq: Once | ORAL | Status: AC
  Administered 2012-03-06: 800 mg via ORAL
  Filled 2012-03-06: qty 2
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
## 2012-03-06 NOTE — ED Notes (Signed)
Rx given x D/c instructions reviewed w/ pt - pt denies any further questions or concerns at present.  Pt ambulating w/ crutches on d/c - in no acute distress - A&OX4.

## 2012-03-06 NOTE — Progress Notes (Signed)
**Note Cheryl-Identified via Obfuscation** Orthopedic Tech Progress Note Patient Details:  Cheryl Thompson 1965/11/12 [id number]  Ortho Devices Type of Ortho Device: ASO Ortho Device/Splint Location: lerft anklle Ortho Device/Splint Interventions: Application   Devann Cribb 03/06/2012, 7:19 PM

## 2012-03-06 NOTE — ED Notes (Signed)
The pt fell this afternoon and now she has pain in her lt lower leg and ankle  Swelling lateral ankle

## 2012-04-03 ENCOUNTER — Encounter: Payer: Self-pay | Admitting: Family Medicine

## 2012-04-03 ENCOUNTER — Ambulatory Visit (INDEPENDENT_AMBULATORY_CARE_PROVIDER_SITE_OTHER): Payer: Self-pay | Admitting: Family Medicine

## 2012-04-03 VITALS — BP 150/100 | HR 78 | Temp 98.1°F | Ht 69.0 in | Wt 156.0 lb

## 2012-04-03 DIAGNOSIS — I10 Essential (primary) hypertension: Secondary | ICD-10-CM

## 2012-04-03 DIAGNOSIS — Z8669 Personal history of other diseases of the nervous system and sense organs: Secondary | ICD-10-CM

## 2012-04-03 DIAGNOSIS — B20 Human immunodeficiency virus [HIV] disease: Secondary | ICD-10-CM

## 2012-04-03 MED ORDER — CLONIDINE HCL ER 0.1 MG PO TB12: 0.2000 mg | ORAL_TABLET | Freq: Once | ORAL | Status: DC

## 2012-04-03 MED ORDER — CLONIDINE HCL 0.2 MG PO TABS: 0.2000 mg | ORAL_TABLET | Freq: Every day | ORAL | Status: DC

## 2012-04-03 MED ORDER — CLONIDINE HCL 0.2 MG PO TABS
0.2000 mg | ORAL_TABLET | Freq: Once | ORAL | Status: AC
  Administered 2012-04-03: 0.2 mg via ORAL

## 2012-04-03 MED ORDER — LISINOPRIL-HYDROCHLOROTHIAZIDE 20-12.5 MG PO TABS: 1.0000 | ORAL_TABLET | Freq: Every day | ORAL | Status: DC

## 2012-04-03 MED ORDER — AMLODIPINE BESYLATE 10 MG PO TABS: 5.0000 mg | ORAL_TABLET | Freq: Every day | ORAL | Status: DC

## 2012-04-03 NOTE — Assessment & Plan Note (Addendum)
BP is way too high-will give Clonidine today and will refill her meds.  She is instructed to restart her BP meds tonight.  Return if no improvement over the next few hours or worsening symptoms.

## 2012-04-03 NOTE — Addendum Note (Signed)
Addended by: Jone Baseman D on: 04/03/2012 05:05 PM   Modules accepted: Orders

## 2012-04-03 NOTE — Progress Notes (Signed)
  Subjective:    Patient ID: Cheryl Thompson, female    DOB: [birth year], 46 y.o.   MRN: [id number]  Hypertension Pertinent negatives include no shortness of breath.   Here for dizziness.  Has not had any medication for a week.  "I thought I felt well" is the reason for not calling for refills.  Today felt dizzy with headache.  Ate some bad fish and was having emesis and hematemesis on Monday but that has resolved.  Just isn't feeling right, feels like she might have a seizure, despite being on meds.  States she feels this way sometimes when her head hurts.     Review of Systems  Constitutional: Negative for fever and chills.  HENT: Negative for congestion.   Respiratory: Negative for chest tightness and shortness of breath.   Gastrointestinal: Negative for nausea, vomiting and abdominal pain.  Genitourinary: Negative for hematuria.  All other systems reviewed and are negative.       Objective:   Physical Exam  Constitutional: She appears well-developed and well-nourished. No distress.  HENT:  Head: Atraumatic.  Eyes: No scleral icterus.  Neck: Neck supple.  Cardiovascular: Normal rate and regular rhythm.   Pulmonary/Chest: Effort normal and breath sounds normal. No respiratory distress.  Abdominal: She exhibits no distension.  Skin: Skin is warm and dry. No rash noted.  Psychiatric: Her affect is blunt. Her speech is delayed.          Assessment & Plan:

## 2012-04-03 NOTE — Patient Instructions (Addendum)

## 2012-04-28 ENCOUNTER — Ambulatory Visit: Payer: Self-pay | Admitting: Family Medicine

## 2012-04-29 ENCOUNTER — Telehealth: Payer: Self-pay | Admitting: Family Medicine

## 2012-04-29 ENCOUNTER — Other Ambulatory Visit: Payer: Self-pay | Admitting: *Deleted

## 2012-04-29 DIAGNOSIS — B20 Human immunodeficiency virus [HIV] disease: Secondary | ICD-10-CM

## 2012-04-29 NOTE — Telephone Encounter (Signed)
Spoke with  Patient and advised that she has not had a PPD through this office in past year. She thinks now [facility]. Advised her to call them and gave her phone number to call.

## 2012-04-29 NOTE — Telephone Encounter (Signed)
Patient is calling for her TB results.  She wanted them to be emailed to her, I suggested that it could be faxed to [phone], Attention: Charlett Nose or Mr. Manson Passey.

## 2012-04-29 NOTE — Telephone Encounter (Signed)
Patient is calling back about the TB test results because she has to get the results to the office where it needs to go, by 4:00.

## 2012-05-01 ENCOUNTER — Other Ambulatory Visit: Payer: Self-pay

## 2012-05-01 ENCOUNTER — Ambulatory Visit: Payer: Self-pay

## 2012-05-09 ENCOUNTER — Other Ambulatory Visit: Payer: Self-pay

## 2012-05-09 ENCOUNTER — Ambulatory Visit: Payer: Self-pay

## 2012-05-20 ENCOUNTER — Ambulatory Visit: Payer: Self-pay | Admitting: Internal Medicine

## 2012-05-20 ENCOUNTER — Other Ambulatory Visit (INDEPENDENT_AMBULATORY_CARE_PROVIDER_SITE_OTHER): Payer: Self-pay

## 2012-05-20 ENCOUNTER — Ambulatory Visit: Payer: Self-pay

## 2012-05-20 DIAGNOSIS — B20 Human immunodeficiency virus [HIV] disease: Secondary | ICD-10-CM

## 2012-05-20 DIAGNOSIS — Z79899 Other long term (current) drug therapy: Secondary | ICD-10-CM

## 2012-05-20 DIAGNOSIS — Z113 Encounter for screening for infections with a predominantly sexual mode of transmission: Secondary | ICD-10-CM

## 2012-05-20 LAB — CBC WITH DIFFERENTIAL/PLATELET
Basophils Absolute: 0 10*3/uL (ref 0.0–0.1)
Eosinophils Relative: 1 % (ref 0–5)
Lymphocytes Relative: 41 % (ref 12–46)
Neutro Abs: 3.2 10*3/uL (ref 1.7–7.7)
Neutrophils Relative %: 50 % (ref 43–77)
Platelets: 137 10*3/uL — ABNORMAL LOW (ref 150–400)
RDW: 14.5 % (ref 11.5–15.5)
WBC: 6.4 10*3/uL (ref 4.0–10.5)

## 2012-05-21 LAB — COMPLETE METABOLIC PANEL WITH GFR
ALT: 54 U/L — ABNORMAL HIGH (ref 0–35)
AST: 97 U/L — ABNORMAL HIGH (ref 0–37)
Calcium: 9 mg/dL (ref 8.4–10.5)
Chloride: 100 mEq/L (ref 96–112)
Creat: 1.02 mg/dL (ref 0.50–1.10)
Potassium: 3.5 mEq/L (ref 3.5–5.3)

## 2012-05-21 LAB — HIV-1 RNA ULTRAQUANT REFLEX TO GENTYP+
HIV 1 RNA Quant: 10019 copies/mL — ABNORMAL HIGH (ref <20)
HIV-1 RNA Quant, Log: 4 {Log} — ABNORMAL HIGH (ref <1.30)

## 2012-05-21 LAB — LIPID PANEL
Cholesterol: 161 mg/dL (ref 0–200)
Total CHOL/HDL Ratio: 5.8 Ratio

## 2012-05-21 LAB — T-HELPER CELL (CD4) - (RCID CLINIC ONLY): CD4 % Helper T Cell: 45 % (ref 33–55)

## 2012-05-25 ENCOUNTER — Emergency Department (HOSPITAL_COMMUNITY): Payer: Self-pay

## 2012-05-25 ENCOUNTER — Emergency Department (HOSPITAL_COMMUNITY)
Admission: EM | Admit: 2012-05-25 | Discharge: 2012-05-26 | Disposition: A | Discharge status: Home / Self Care | Payer: Self-pay | Attending: Emergency Medicine | Admitting: Emergency Medicine

## 2012-05-25 ENCOUNTER — Emergency Department (HOSPITAL_COMMUNITY)
Admission: EM | Admit: 2012-05-25 | Discharge: 2012-05-25 | Disposition: A | Discharge status: Home / Self Care | Payer: Self-pay | Attending: Emergency Medicine | Admitting: Emergency Medicine

## 2012-05-25 ENCOUNTER — Encounter (HOSPITAL_COMMUNITY): Payer: Self-pay | Admitting: Emergency Medicine

## 2012-05-25 DIAGNOSIS — F329 Major depressive disorder, single episode, unspecified: Secondary | ICD-10-CM | POA: Insufficient documentation

## 2012-05-25 DIAGNOSIS — Z79899 Other long term (current) drug therapy: Secondary | ICD-10-CM | POA: Insufficient documentation

## 2012-05-25 DIAGNOSIS — F3289 Other specified depressive episodes: Secondary | ICD-10-CM | POA: Insufficient documentation

## 2012-05-25 DIAGNOSIS — R059 Cough, unspecified: Secondary | ICD-10-CM | POA: Insufficient documentation

## 2012-05-25 DIAGNOSIS — R05 Cough: Secondary | ICD-10-CM | POA: Insufficient documentation

## 2012-05-25 DIAGNOSIS — R071 Chest pain on breathing: Secondary | ICD-10-CM | POA: Insufficient documentation

## 2012-05-25 DIAGNOSIS — F17200 Nicotine dependence, unspecified, uncomplicated: Secondary | ICD-10-CM | POA: Insufficient documentation

## 2012-05-25 DIAGNOSIS — R0789 Other chest pain: Secondary | ICD-10-CM

## 2012-05-25 DIAGNOSIS — F1910 Other psychoactive substance abuse, uncomplicated: Secondary | ICD-10-CM | POA: Insufficient documentation

## 2012-05-25 DIAGNOSIS — IMO0001 Reserved for inherently not codable concepts without codable children: Secondary | ICD-10-CM | POA: Insufficient documentation

## 2012-05-25 DIAGNOSIS — G40909 Epilepsy, unspecified, not intractable, without status epilepticus: Secondary | ICD-10-CM | POA: Insufficient documentation

## 2012-05-25 DIAGNOSIS — I10 Essential (primary) hypertension: Secondary | ICD-10-CM | POA: Insufficient documentation

## 2012-05-25 DIAGNOSIS — G479 Sleep disorder, unspecified: Secondary | ICD-10-CM | POA: Insufficient documentation

## 2012-05-25 DIAGNOSIS — B20 Human immunodeficiency virus [HIV] disease: Secondary | ICD-10-CM | POA: Insufficient documentation

## 2012-05-25 DIAGNOSIS — F411 Generalized anxiety disorder: Secondary | ICD-10-CM | POA: Insufficient documentation

## 2012-05-25 DIAGNOSIS — Z7982 Long term (current) use of aspirin: Secondary | ICD-10-CM | POA: Insufficient documentation

## 2012-05-25 DIAGNOSIS — R231 Pallor: Secondary | ICD-10-CM | POA: Insufficient documentation

## 2012-05-25 DIAGNOSIS — Z21 Asymptomatic human immunodeficiency virus [HIV] infection status: Secondary | ICD-10-CM | POA: Insufficient documentation

## 2012-05-25 DIAGNOSIS — L509 Urticaria, unspecified: Secondary | ICD-10-CM | POA: Insufficient documentation

## 2012-05-25 DIAGNOSIS — R079 Chest pain, unspecified: Secondary | ICD-10-CM | POA: Insufficient documentation

## 2012-05-25 DIAGNOSIS — R21 Rash and other nonspecific skin eruption: Secondary | ICD-10-CM | POA: Insufficient documentation

## 2012-05-25 LAB — BASIC METABOLIC PANEL
CO2: 29 mEq/L (ref 19–32)
Calcium: 8.7 mg/dL (ref 8.4–10.5)
Chloride: 100 mEq/L (ref 96–112)
GFR calc Af Amer: >90 mL/min (ref >90)
Sodium: 136 mEq/L (ref 135–145)

## 2012-05-25 LAB — CBC
Platelets: 130 10*3/uL — ABNORMAL LOW (ref 150–400)
RBC: 3.32 MIL/uL — ABNORMAL LOW (ref 3.87–5.11)
RDW: 14.2 % (ref 11.5–15.5)
WBC: 8.1 10*3/uL (ref 4.0–10.5)

## 2012-05-25 LAB — POCT I-STAT TROPONIN I: Troponin i, poc: 0.01 ng/mL (ref 0.00–0.08)

## 2012-05-25 MED ORDER — DIVALPROEX SODIUM 500 MG PO DR TAB
500.0000 mg | DELAYED_RELEASE_TABLET | Freq: Once | ORAL | Status: AC
  Administered 2012-05-25: 500 mg via ORAL
  Filled 2012-05-25: qty 1

## 2012-05-25 MED ORDER — HYDROCODONE-ACETAMINOPHEN 5-325 MG PO TABS: 2.0000 | ORAL_TABLET | ORAL | Status: DC | PRN

## 2012-05-25 MED ORDER — DEXAMETHASONE SODIUM PHOSPHATE 10 MG/ML IJ SOLN
10.0000 mg | Freq: Once | INTRAMUSCULAR | Status: AC
  Administered 2012-05-25: 10 mg via INTRAMUSCULAR
  Filled 2012-05-25: qty 1

## 2012-05-25 NOTE — ED Notes (Signed)
Pt was seen earlier today and was discharged, pt  Was waiting in waiting room for ride and began to feel like she was shaking and she was planning on going to get some alcohol. Pt decided to stay and ask for inpt treatment.  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  Pt states she needs people for support and resources. Barnett Hatter P

## 2012-05-25 NOTE — ED Notes (Signed)
Patient was at [facility] and started having pain to her chest after cough. The patient was just released from [place]; and has seizure in [place] and rash

## 2012-05-25 NOTE — ED Notes (Signed)
Portable x-ray done

## 2012-05-25 NOTE — ED Provider Notes (Addendum)
History     CSN: [id number]  Arrival date & time 05/25/12  1315   First MD Initiated Contact with Patient 05/25/12 1643      Chief Complaint  Patient presents with  . Chest Pain  . Cough    HPI Patient was at [facility] and started having pain to her chest after cough. The patient was just released from [place]; and has seizure in [place] and rash.  Rash is itchy and on her arms and trunk area.  Patient denies any fever.  Has missed 2 episodes of Depakote which she normally takes as medication to prevent seizures.  Patient has history of HIV, hypertension, seizures and depression.  Past Medical History  Diagnosis Date  . HIV (human immunodeficiency virus infection)   . Hypertension   . Seizures   . Depression     Past Surgical History  Procedure Date  . Total abdominal hysterectomy w/ bilateral salpingoophorectomy 2006  . 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 Abdominal hysterectomy     History reviewed. No pertinent family history.  History  Substance Use Topics  . Smoking status: Current Every Day Smoker -- 0.2 packs/day    Types: Cigarettes  . Smokeless tobacco: Not on file     Comment: in process of quitting/ 4 cigarettes per day  . Alcohol Use: 4.0 oz/week    8 drink(s) per week     Comment: Drinks a pint of hard alcohol at a time    OB History    Grav Para Term Preterm Abortions TAB SAB Ect Mult Living                  Review of Systems All other systems reviewed and are negative Allergies  Penicillins and Naproxen  Home Medications   Current Outpatient Rx  Name  Route  Sig  Dispense  Refill  . AMLODIPINE BESYLATE 10 MG PO TABS   Oral   Take 5 mg by mouth daily.         . ASPIRIN EFFERVESCENT 325 MG PO TBEF   Oral   Take 325 mg by mouth every 6 (six) hours as needed.         Marland Kitchen VITAMIN D 1000 UNITS PO TABS   Oral   Take 1,000 Units by mouth daily.           Marland Kitchen DIVALPROEX SODIUM ER 250 MG PO TB24    Oral   Take 250 mg by mouth 2 (two) times daily.         Marland Kitchen LISINOPRIL-HYDROCHLOROTHIAZIDE 20-12.5 MG PO TABS   Oral   Take 1 tablet by mouth daily.         Marland Kitchen HYDROCODONE-ACETAMINOPHEN 5-325 MG PO TABS   Oral   Take 2 tablets by mouth every 4 (four) hours as needed for pain.   10 tablet   0     BP 168/86  Pulse 70  Temp 99.9 F (37.7 C) (Oral)  Resp 16  SpO2 100%  Physical Exam  Nursing note and vitals reviewed. Constitutional: She is oriented to person, place, and time. She appears well-developed and well-nourished. No distress.  HENT:  Head: Normocephalic and atraumatic.  Mouth/Throat: No oropharyngeal exudate.  Eyes: Pupils are equal, round, and reactive to light.  Neck: Normal range of motion.  Cardiovascular: Normal rate and intact distal pulses.   Pulmonary/Chest: No respiratory distress.  Abdominal: Normal appearance. She exhibits no distension.  Musculoskeletal: Normal range of motion.  Neurological: She is alert and oriented to person, place, and time. No cranial nerve deficit.  Skin: Skin is warm and dry. Rash (Urticarial rash on arms and trunk) noted.  Psychiatric: She has a normal mood and affect. Her behavior is normal.    ED Course  Procedures (including critical care time)  Date: 05/25/2012  Rate: 72  Rhythm: normal sinus rhythm  QRS Axis: normal  Intervals: normal  ST/T Wave abnormalities LVH  Conduction Disutrbances: Right bundle branch block  Narrative Interpretation: Unchanged from previous EKG   Medications  aspirin-sod bicarb-citric acid (ALKA-SELTZER) 325 MG TBEF (not administered)  lisinopril-hydrochlorothiazide (PRINZIDE,ZESTORETIC) 20-12.5 MG per tablet (not administered)  amLODipine (NORVASC) 10 MG tablet (not administered)  dexamethasone (DECADRON) injection 10 mg (not administered)  divalproex (DEPAKOTE) DR tablet 500 mg (not administered)  HYDROcodone-acetaminophen (NORCO/VICODIN) 5-325 MG per tablet (not administered)       Labs Reviewed  CBC - Abnormal; Notable for the following:    RBC 3.32 (*)     Hemoglobin 10.7 (*)     HCT 31.4 (*)     Platelets 130 (*)     All other components within normal limits  BASIC METABOLIC PANEL - Abnormal; Notable for the following:    GFR calc non Af Amer 85 (*)     All other components within normal limits  POCT I-STAT TROPONIN I   Dg Chest Port 1 View  05/25/2012  *RADIOLOGY REPORT*  Clinical Data: Chest pain.  Cough.  PORTABLE CHEST - 1 VIEW  Comparison: 05/07/2011  Findings: Cardiac and mediastinal contours appear normal.  The lungs appear clear.  No pleural effusion is identified.  IMPRESSION:  No significant abnormality identified.   Original Report Authenticated By: Gaylyn Rong, M.D.      1. Chest wall pain   2. Urticaria       MDM          Nelia Shi, MD 05/25/12 1657  Nelia Shi, MD 05/25/12 [phone]
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
## 2012-05-26 ENCOUNTER — Encounter (HOSPITAL_COMMUNITY): Payer: Self-pay | Admitting: Emergency Medicine

## 2012-05-26 LAB — RAPID URINE DRUG SCREEN, HOSP PERFORMED
Barbiturates: NOT DETECTED
Cocaine: POSITIVE — AB
Opiates: NOT DETECTED

## 2012-05-26 MED ORDER — DIVALPROEX SODIUM ER 250 MG PO TB24
250.0000 mg | ORAL_TABLET | Freq: Two times a day (BID) | ORAL | Status: DC
  Administered 2012-05-26 (×2): 250 mg via ORAL
  Filled 2012-05-26 (×3): qty 1

## 2012-05-26 MED ORDER — LISINOPRIL-HYDROCHLOROTHIAZIDE 20-12.5 MG PO TABS: 1.0000 | ORAL_TABLET | Freq: Every day | ORAL | Status: DC

## 2012-05-26 MED ORDER — ONDANSETRON HCL 4 MG PO TABS: 4.0000 mg | ORAL_TABLET | Freq: Three times a day (TID) | ORAL | Status: DC | PRN

## 2012-05-26 MED ORDER — LISINOPRIL 20 MG PO TABS
20.0000 mg | ORAL_TABLET | Freq: Every day | ORAL | Status: DC
  Administered 2012-05-26: 20 mg via ORAL
  Filled 2012-05-26: qty 1

## 2012-05-26 MED ORDER — VITAMIN D3 25 MCG (1000 UNIT) PO TABS
1000.0000 [IU] | ORAL_TABLET | Freq: Every day | ORAL | Status: DC
  Administered 2012-05-26: 1000 [IU] via ORAL
  Filled 2012-05-26: qty 1

## 2012-05-26 MED ORDER — AMLODIPINE BESYLATE 5 MG PO TABS
5.0000 mg | ORAL_TABLET | Freq: Every day | ORAL | Status: DC
  Administered 2012-05-26: 5 mg via ORAL
  Filled 2012-05-26: qty 1

## 2012-05-26 MED ORDER — HYDROCHLOROTHIAZIDE 12.5 MG PO CAPS
12.5000 mg | ORAL_CAPSULE | Freq: Every day | ORAL | Status: DC
  Administered 2012-05-26: 12.5 mg via ORAL
  Filled 2012-05-26: qty 1

## 2012-05-26 MED ORDER — ACETAMINOPHEN 325 MG PO TABS
650.0000 mg | ORAL_TABLET | ORAL | Status: DC | PRN
  Administered 2012-05-26: 650 mg via ORAL
  Filled 2012-05-26: qty 2

## 2012-05-26 MED ORDER — ZOLPIDEM TARTRATE 5 MG PO TABS: 5.0000 mg | ORAL_TABLET | Freq: Every evening | ORAL | Status: DC | PRN

## 2012-05-26 NOTE — ED Provider Notes (Signed)
History     CSN: [id number]  Arrival date & time 05/25/12  2152   First MD Initiated Contact with Patient 05/26/12 0014      Chief Complaint  Patient presents with  . Alcohol Intoxication    (Consider location/radiation/quality/duration/timing/severity/associated sxs/prior treatment) HPI Comments: Patient wants detox help was seen earlier for CP and ETOH declined admission for detox but after DC changed her mind while sitting in the [place] [facility] her last ETOH was over 24 hours ago now feeling very shaky   Patient is a 46 y.o. female presenting with intoxication. The history is provided by the patient.  Alcohol Intoxication This is a chronic problem. The problem occurs constantly. Associated symptoms include chest pain and myalgias. Pertinent negatives include no chills, fever, headaches or weakness.    Past Medical History  Diagnosis Date  . HIV (human immunodeficiency virus infection)   . Hypertension   . Seizures   . Depression     Past Surgical History  Procedure Date  . Total abdominal hysterectomy w/ bilateral salpingoophorectomy 2006  . 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 Abdominal hysterectomy     No family history on file.  History  Substance Use Topics  . Smoking status: Current Every Day Smoker -- 0.2 packs/day    Types: Cigarettes  . Smokeless tobacco: Not on file     Comment: in process of quitting/ 4 cigarettes per day  . Alcohol Use: 4.0 oz/week    8 drink(s) per week     Comment: Drinks a pint of hard alcohol at a time    OB History    Grav Para Term Preterm Abortions TAB SAB Ect Mult Living                  Review of Systems  Constitutional: Negative for fever and chills.  HENT: Negative for rhinorrhea.   Respiratory: Negative for shortness of breath.   Cardiovascular: Positive for chest pain.  Genitourinary: Negative.   Musculoskeletal: Positive for myalgias.  Skin: Positive for pallor.  Neurological:  Negative for dizziness, weakness and headaches.  Psychiatric/Behavioral: Positive for sleep disturbance. Negative for suicidal ideas. The patient is nervous/anxious.     Allergies  Penicillins and Naproxen  Home Medications   Current Outpatient Rx  Name  Route  Sig  Dispense  Refill  . AMLODIPINE BESYLATE 10 MG PO TABS   Oral   Take 5 mg by mouth daily.         Marland Kitchen VITAMIN D 1000 UNITS PO TABS   Oral   Take 1,000 Units by mouth daily.           Marland Kitchen DIVALPROEX SODIUM ER 250 MG PO TB24   Oral   Take 250 mg by mouth 2 (two) times daily.         Marland Kitchen HYDROCODONE-ACETAMINOPHEN 5-325 MG PO TABS   Oral   Take 2 tablets by mouth every 4 (four) hours as needed for pain.   10 tablet   0   . LISINOPRIL-HYDROCHLOROTHIAZIDE 20-12.5 MG PO TABS   Oral   Take 1 tablet by mouth daily.           BP 169/99  Pulse 94  Resp 24  SpO2 99%  Physical Exam  Constitutional: She is oriented to person, place, and time. She appears well-developed and well-nourished.  HENT:  Head: Normocephalic.  Eyes: Pupils are equal, round, and reactive to light.  Neck:  Normal range of motion.  Cardiovascular: Normal rate and regular rhythm.   Pulmonary/Chest: Effort normal.  Abdominal: Soft. Bowel sounds are normal. She exhibits no distension. There is no tenderness.  Musculoskeletal: Normal range of motion. She exhibits no edema and no tenderness.  Neurological: She is alert and oriented to person, place, and time.  Skin: Skin is warm. No rash noted.  Psychiatric: Judgment and thought content normal. Her mood appears anxious. Cognition and memory are normal. She exhibits a depressed mood.    ED Course  Procedures (including critical care time)   Labs Reviewed  ETHANOL  URINE RAPID DRUG SCREEN (HOSP PERFORMED)   Dg Chest Port 1 View  05/25/2012  *RADIOLOGY REPORT*  Clinical Data: Chest pain.  Cough.  PORTABLE CHEST - 1 VIEW  Comparison: 05/07/2011  Findings: Cardiac and mediastinal contours  appear normal.  The lungs appear clear.  No pleural effusion is identified.  IMPRESSION:  No significant abnormality identified.   Original Report Authenticated By: Gaylyn Rong, M.D.      No diagnosis found.    MDM   Will recheck urine drug screen and ETOH level as she was DC and have ACT evaluation   Report give to next shift awaiting ACT evaluationa dndisposition      Arman Filter, NP 05/26/12 2115

## 2012-05-26 NOTE — BHH Counselor (Signed)
Patient presented to [facility] seeking detox treatment. She last used substances (alcohol, THC, crack cocaine) 05/23/2012. Today is 05/26/2012 and patient denies withdrawal symptoms or further complains. It is my opinion that detox is no longer necessarily appropriate, however; attempted to get patient into [facility], and [facility]. [facility] is at capacity and will not have any more female beds today. [facility] declined patient due to her seizure disorder. Patient does not meet criteria for [facility] b/c she last used substances 05/24/2012. I spoke with patient about other options including residential. Patient stated that she wanted residential treatment at this time. Contact [facility] to inquire about their residential program and per [facility] they are at capacity. Also contacted [facility], however; they are closed for today due to the holiday. Patient given instructions to call both facilities first thing in the morning for a residential bed.
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
## 2012-05-26 NOTE — ED Notes (Signed)
Pt c/o leg cramps

## 2012-05-26 NOTE — ED Notes (Signed)
Pt discharged home. Resources for f/u care given by ACT. Pt verbalizes understanding. Denies SI/HI.

## 2012-05-27 ENCOUNTER — Telehealth: Payer: Self-pay | Admitting: *Deleted

## 2012-05-27 ENCOUNTER — Ambulatory Visit: Payer: Self-pay | Admitting: Licensed Clinical Social Worker

## 2012-05-27 DIAGNOSIS — A549 Gonococcal infection, unspecified: Secondary | ICD-10-CM

## 2012-05-27 MED ORDER — CEFTRIAXONE SODIUM 1 G IJ SOLR
250.0000 mg | Freq: Once | INTRAMUSCULAR | Status: AC
  Administered 2012-05-27: 250 mg via INTRAMUSCULAR

## 2012-05-27 MED ORDER — AZITHROMYCIN 250 MG PO TABS
1000.0000 mg | ORAL_TABLET | Freq: Once | ORAL | Status: AC
  Administered 2012-05-27: 1000 mg via ORAL

## 2012-05-27 NOTE — Telephone Encounter (Signed)
Error, patient treated with Rocephin 250 mg IM and Azithromycin 1,000 mg by mouth. Wendall Mola CMA

## 2012-05-27 NOTE — ED Provider Notes (Signed)
Medical screening examination/treatment/procedure(s) were performed by non-physician practitioner and as supervising physician I was immediately available for consultation/collaboration.  Sunnie Nielsen, MD 05/27/12 [phone]

## 2012-05-27 NOTE — Telephone Encounter (Signed)
Notified patient she needs to come to the office to be treated for gonorrhea. Rocephin 125 mg injection and azithromycin 1000 mg by mouth.  She will come in this morning. Wendall Mola CMA

## 2012-06-09 ENCOUNTER — Other Ambulatory Visit: Payer: Self-pay | Admitting: *Deleted

## 2012-06-10 ENCOUNTER — Ambulatory Visit: Payer: Self-pay | Admitting: Internal Medicine

## 2012-06-16 ENCOUNTER — Telehealth: Payer: Self-pay | Admitting: *Deleted

## 2012-06-16 ENCOUNTER — Other Ambulatory Visit: Payer: Self-pay | Admitting: *Deleted

## 2012-06-16 ENCOUNTER — Ambulatory Visit: Payer: Self-pay | Admitting: Internal Medicine

## 2012-06-16 NOTE — Telephone Encounter (Signed)
Called and left patient a voice mail to call the clinic to reschedule her appt, she no showed today. Cheryl Thompson  

## 2012-06-19 ENCOUNTER — Telehealth: Payer: Self-pay | Admitting: *Deleted

## 2012-06-19 ENCOUNTER — Ambulatory Visit: Payer: Self-pay | Admitting: Internal Medicine

## 2012-06-19 NOTE — Telephone Encounter (Signed)
Called patient to reschedule her appt, she no showed today. Unable to leave a voice mail.  Wendall Mola CMA

## 2012-06-25 ENCOUNTER — Encounter: Payer: Self-pay | Admitting: Internal Medicine

## 2012-06-25 ENCOUNTER — Ambulatory Visit (INDEPENDENT_AMBULATORY_CARE_PROVIDER_SITE_OTHER): Payer: Self-pay | Admitting: Internal Medicine

## 2012-06-25 VITALS — BP 158/112 | HR 73 | Temp 97.8°F | Ht 69.0 in | Wt 167.0 lb

## 2012-06-25 DIAGNOSIS — F1910 Other psychoactive substance abuse, uncomplicated: Secondary | ICD-10-CM

## 2012-06-25 DIAGNOSIS — B20 Human immunodeficiency virus [HIV] disease: Secondary | ICD-10-CM

## 2012-06-25 MED ORDER — DARUNAVIR ETHANOLATE 800 MG PO TABS: 800.0000 mg | ORAL_TABLET | Freq: Every day | ORAL | Status: DC

## 2012-06-25 MED ORDER — RITONAVIR 100 MG PO TABS: 100.0000 mg | ORAL_TABLET | Freq: Every day | ORAL | Status: DC

## 2012-06-25 MED ORDER — EMTRICITABINE-TENOFOVIR DF 200-300 MG PO TABS: 1.0000 | ORAL_TABLET | Freq: Every day | ORAL | Status: DC

## 2012-06-25 NOTE — Assessment & Plan Note (Signed)
I discussed the different options. She does have baseline resistance to NNRTIs. Also because she is on Depakote she does have interactions. Because of the drug use as well, I have opted to start her on Prezista, Norvir and Truvada. She will get this through drug assistance program. Once she starts, I will see her labs in one month later and I will see her one to 2 weeks after the lab visit. She knows to use condoms with sexual activity the  More than 45 minutes spent with face to face contact, counseling and coordination of care

## 2012-06-25 NOTE — Assessment & Plan Note (Signed)
I did discuss with her the need to stop using cocaine. Her blood pressure today was significantly elevated due to the cocaine. I discussed this with her and she is going to follow with her primary physician regarding her blood pressure. I did discuss the perils of high blood pressure including stroke and heart attack. She is going to attempt be drug-free. She denies the need for any rehabilitation program.

## 2012-06-25 NOTE — Progress Notes (Signed)
  Subjective:    Patient ID: Cheryl Thompson, female    DOB: 1966/05/11, 46 y.o.   MRN: [id number]  HPI She comes in to reestablish care. She has a long history of HIV and was last seen in 2011. She also has bipolar disorder as well as significant substance abuse including cocaine use. She has hypertension. She is interested in reestablishing care and starting therapy as indicated. She understands the need for taking the medication daily. She feels that she does take her medications well and is happy to take another medication daily for her HIV. She has no questions regarding HIV.   Review of Systems  Constitutional: Negative for appetite change and fatigue.  HENT: Negative for sore throat and trouble swallowing.   Respiratory: Negative for cough and shortness of breath.   Cardiovascular: Negative for chest pain, palpitations and leg swelling.  Gastrointestinal: Negative for nausea, abdominal pain and diarrhea.  Musculoskeletal: Negative for myalgias, joint swelling and arthralgias.  Skin: Negative for rash.  Neurological: Negative for dizziness and headaches.       Objective:   Physical Exam  Constitutional: She appears well-developed and well-nourished. No distress.  HENT:  Mouth/Throat: Oropharynx is clear and moist. No oropharyngeal exudate.  Cardiovascular: Normal rate, regular rhythm and normal heart sounds.  Exam reveals no gallop and no friction rub.   No murmur heard. Pulmonary/Chest: Effort normal and breath sounds normal. No respiratory distress. She has no wheezes. She has no rales.  Abdominal: Soft. Bowel sounds are normal. She exhibits no distension. There is no tenderness. There is no rebound.          Assessment & Plan:

## 2012-07-07 ENCOUNTER — Other Ambulatory Visit: Payer: Self-pay | Admitting: *Deleted

## 2012-07-07 DIAGNOSIS — B20 Human immunodeficiency virus [HIV] disease: Secondary | ICD-10-CM

## 2012-07-07 MED ORDER — EMTRICITABINE-TENOFOVIR DF 200-300 MG PO TABS: 1.0000 | ORAL_TABLET | Freq: Every day | ORAL | Status: DC

## 2012-07-07 MED ORDER — DARUNAVIR ETHANOLATE 800 MG PO TABS: 800.0000 mg | ORAL_TABLET | Freq: Every day | ORAL | Status: DC

## 2012-07-07 MED ORDER — RITONAVIR 100 MG PO TABS: 100.0000 mg | ORAL_TABLET | Freq: Every day | ORAL | Status: DC

## 2012-07-07 NOTE — Telephone Encounter (Signed)
Patient ADAP was approved. 

## 2012-07-09 ENCOUNTER — Emergency Department (HOSPITAL_COMMUNITY)
Admission: EM | Admit: 2012-07-09 | Discharge: 2012-07-09 | Disposition: A | Discharge status: Home / Self Care | Payer: Self-pay | Attending: Emergency Medicine | Admitting: Emergency Medicine

## 2012-07-09 ENCOUNTER — Encounter (HOSPITAL_COMMUNITY): Payer: Self-pay | Admitting: Emergency Medicine

## 2012-07-09 ENCOUNTER — Emergency Department (HOSPITAL_COMMUNITY): Payer: Self-pay

## 2012-07-09 DIAGNOSIS — M7989 Other specified soft tissue disorders: Secondary | ICD-10-CM | POA: Insufficient documentation

## 2012-07-09 DIAGNOSIS — F17200 Nicotine dependence, unspecified, uncomplicated: Secondary | ICD-10-CM | POA: Insufficient documentation

## 2012-07-09 DIAGNOSIS — M25569 Pain in unspecified knee: Secondary | ICD-10-CM | POA: Insufficient documentation

## 2012-07-09 DIAGNOSIS — Z8659 Personal history of other mental and behavioral disorders: Secondary | ICD-10-CM | POA: Insufficient documentation

## 2012-07-09 DIAGNOSIS — G40909 Epilepsy, unspecified, not intractable, without status epilepticus: Secondary | ICD-10-CM | POA: Insufficient documentation

## 2012-07-09 DIAGNOSIS — M25561 Pain in right knee: Secondary | ICD-10-CM

## 2012-07-09 DIAGNOSIS — Z79899 Other long term (current) drug therapy: Secondary | ICD-10-CM | POA: Insufficient documentation

## 2012-07-09 DIAGNOSIS — Z21 Asymptomatic human immunodeficiency virus [HIV] infection status: Secondary | ICD-10-CM | POA: Insufficient documentation

## 2012-07-09 DIAGNOSIS — I10 Essential (primary) hypertension: Secondary | ICD-10-CM | POA: Insufficient documentation

## 2012-07-09 MED ORDER — ACETAMINOPHEN 325 MG PO TABS
650.0000 mg | ORAL_TABLET | Freq: Once | ORAL | Status: AC
  Administered 2012-07-09: 650 mg via ORAL
  Filled 2012-07-09: qty 2

## 2012-07-09 MED ORDER — TRAMADOL HCL 50 MG PO TABS: 50.0000 mg | ORAL_TABLET | Freq: Four times a day (QID) | ORAL | Status: DC | PRN

## 2012-07-09 NOTE — ED Provider Notes (Signed)
Patient History     CSN: [id number]  Arrival date & time 07/09/12  1208   First MD Initiated Contact with Patient 07/09/12 1242      Chief Complaint  Patient presents with  . Knee Pain    Bilateral   . Leg Swelling    (Consider location/radiation/quality/duration/timing/severity/associated sxs/prior treatment) Patient is a 46 y.o. female presenting with knee pain. The history is provided by the patient.  Knee Pain This is a new problem. Pertinent negatives include no chest pain, no abdominal pain, no headaches and no shortness of breath.   patient has had some pain in her left knee since 2 days ago. She states she felt a pop but she was getting some things ready for her mother's birthday. No trauma. She states since then her right knee has began to hurt. She states it feels like there is some grinding in her left knee. She's a history of gout. No fall. No chest pain. No swelling in her leg. There has been some swelling in her knees. No numbness or weakness. Recently started on new HIV medications, but states she has not taken them yet.  Past Medical History  Diagnosis Date  . HIV (human immunodeficiency virus infection)   . Hypertension   . Depression   . Seizures     Past Surgical History  Procedure Date  . Total abdominal hysterectomy w/ bilateral salpingoophorectomy 2006  . 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 Abdominal hysterectomy     No family history on file.  History  Substance Use Topics  . Smoking status: Current Every Day Smoker -- 0.2 packs/day    Types: Cigarettes  . Smokeless tobacco: Never Used     Comment: in process of quitting/ 4 cigarettes per day  . Alcohol Use: 4.0 oz/week    8 drink(s) per week     Comment: Drinks a pint of hard alcohol at a time    OB History    Grav Para Term Preterm Abortions TAB SAB Ect Mult Living                  Review of Systems  Constitutional: Negative for activity change  and appetite change.  HENT: Negative for neck stiffness.   Eyes: Negative for pain.  Respiratory: Negative for chest tightness and shortness of breath.   Cardiovascular: Negative for chest pain and leg swelling.  Gastrointestinal: Negative for nausea, vomiting, abdominal pain and diarrhea.  Genitourinary: Negative for flank pain.  Musculoskeletal: Positive for joint swelling. Negative for back pain and arthralgias.  Skin: Negative for rash.  Neurological: Negative for weakness, numbness and headaches.  Psychiatric/Behavioral: Negative for behavioral problems.    Allergies  Penicillins and Naproxen  Home Medications   Current Outpatient Rx  Name  Route  Sig  Dispense  Refill  . AMLODIPINE BESYLATE 10 MG PO TABS   Oral   Take 5 mg by mouth every morning.          Marland Kitchen VITAMIN D 1000 UNITS PO TABS   Oral   Take 1,000 Units by mouth every morning.          Marland Kitchen DARUNAVIR ETHANOLATE 800 MG PO TABS   Oral   Take 1 tablet (800 mg total) by mouth daily.   30 tablet   5   . DIVALPROEX SODIUM ER 250 MG PO TB24   Oral   Take 250 mg by mouth 2 (two)  times daily.         Marland Kitchen EMTRICITABINE-TENOFOVIR 200-300 MG PO TABS   Oral   Take 1 tablet by mouth daily.   30 tablet   5   . LISINOPRIL-HYDROCHLOROTHIAZIDE 20-12.5 MG PO TABS   Oral   Take 1 tablet by mouth every morning.          Marland Kitchen RITONAVIR 100 MG PO TABS   Oral   Take 1 tablet (100 mg total) by mouth daily.   30 tablet   5   . HYDROCODONE-ACETAMINOPHEN 5-325 MG PO TABS   Oral   Take 2 tablets by mouth every 4 (four) hours as needed for pain.   10 tablet   0   . TRAMADOL HCL 50 MG PO TABS   Oral   Take 1 tablet (50 mg total) by mouth every 6 (six) hours as needed for pain.   15 tablet   0     BP 161/93  Pulse 67  Temp 98.6 F (37 C) (Oral)  Resp 18  SpO2 100%  Physical Exam  Nursing note and vitals reviewed. Constitutional: She is oriented to person, place, and time. She appears well-developed and  well-nourished.  HENT:  Head: Normocephalic and atraumatic.  Eyes: EOM are normal. Pupils are equal, round, and reactive to light.  Neck: Normal range of motion. Neck supple.  Cardiovascular: Normal rate, regular rhythm and normal heart sounds.   No murmur heard. Pulmonary/Chest: Effort normal and breath sounds normal. No respiratory distress. She has no wheezes. She has no rales.  Abdominal: Soft. Bowel sounds are normal. She exhibits no distension. There is no tenderness. There is no rebound and no guarding.  Musculoskeletal: Normal range of motion.       Mild tenderness to bilateral knees. Mild pain with movement. No crepitance or deformity. Previous surgical scar below right knee. Possible small effusions. No peripheral edema. Sensation and pulses intact distally  Neurological: She is alert and oriented to person, place, and time. No cranial nerve deficit.  Skin: Skin is warm and dry.  Psychiatric: She has a normal mood and affect. Her speech is normal.    ED Course  Procedures (including critical care time)  Labs Reviewed - No data to display Dg Knee Complete 4 Views Left  07/09/2012  *RADIOLOGY REPORT*  Clinical Data: Pain, no known injury  LEFT KNEE - COMPLETE 4+ VIEW  Comparison: 06/14/2011  Findings: Four views of the left knee submitted.  Again noted degenerative changes with narrowing of medial joint compartment. Spurring of medial tibial plateau again noted.  Mild spurring of medial and  lateral femoral condyle.  Narrowing of patellofemoral joint space.  Small joint effusion.  No acute fracture or subluxation.  IMPRESSION: No acute fracture or subluxation.  Degenerative changes as described above.   Original Report Authenticated By: Natasha Mead, M.D.      1. Knee pain, bilateral       MDM  Patient with bilateral knee pain. History of same. X-ray and left which is more painful today is stable. She'll be discharged home. I doubt this is gout.        Juliet Rude.  Rubin Payor, MD 07/09/12 [phone]

## 2012-07-09 NOTE — ED Notes (Signed)
Patient transported to X-ray 

## 2012-07-09 NOTE — ED Notes (Signed)
Pt presents w/ bilateral leg pain and swelling, unable to properly assess d/t pt wearing leggings. Pain increases w/ ambulation. This started 12/23 but pt states she was too busy to come to ED until today.

## 2012-07-09 NOTE — ED Notes (Signed)
Pt returned from xray

## 2012-07-14 ENCOUNTER — Telehealth: Payer: Self-pay | Admitting: *Deleted

## 2012-07-14 IMAGING — CR DG CHEST 2V
2 series · 2 of 2 positions shown · non-contrast
Comparison: April 25, 2011

CLINICAL DATA: Chest pain, cough, smoker

CHEST - 2 VIEW

[w chest pa]
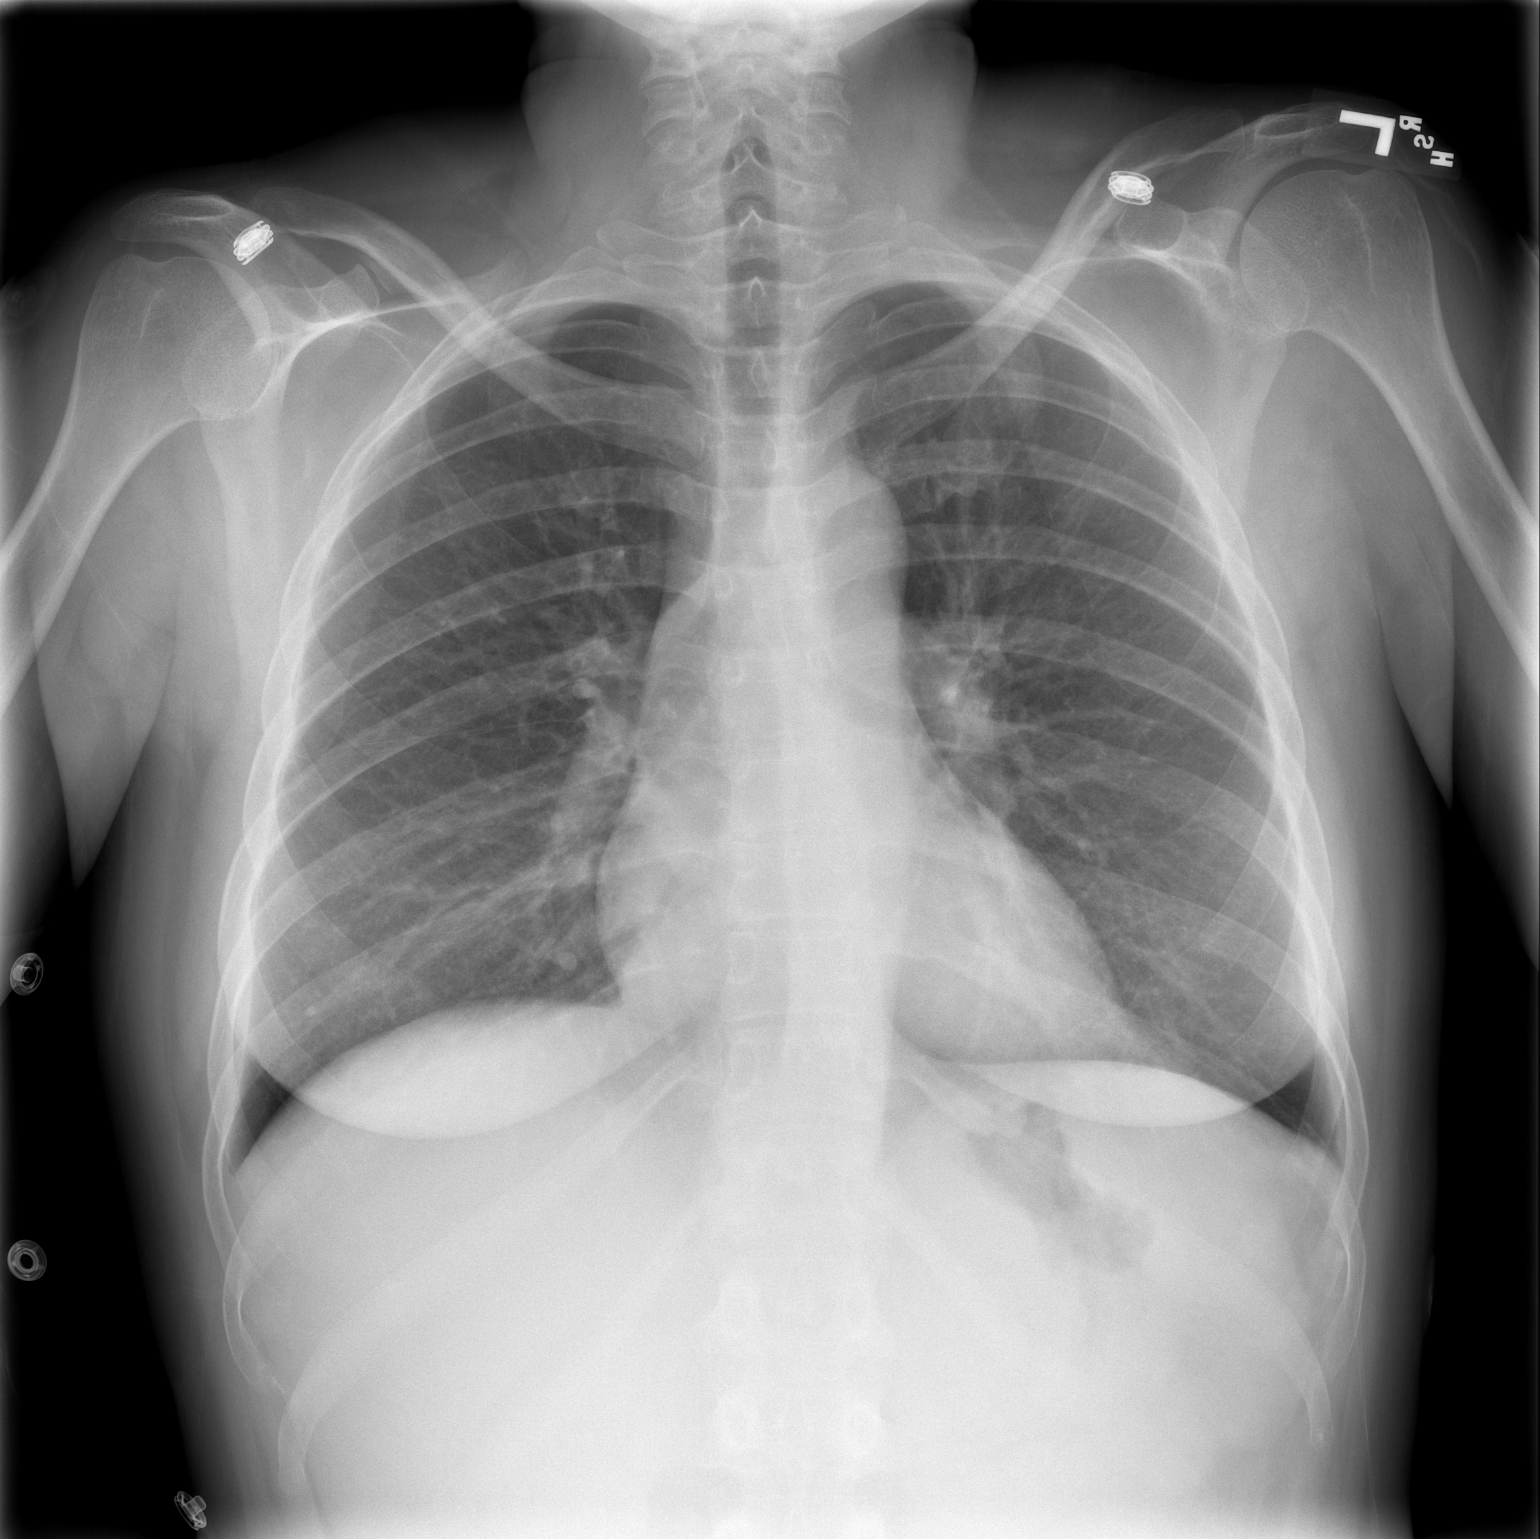

[w chest lat]
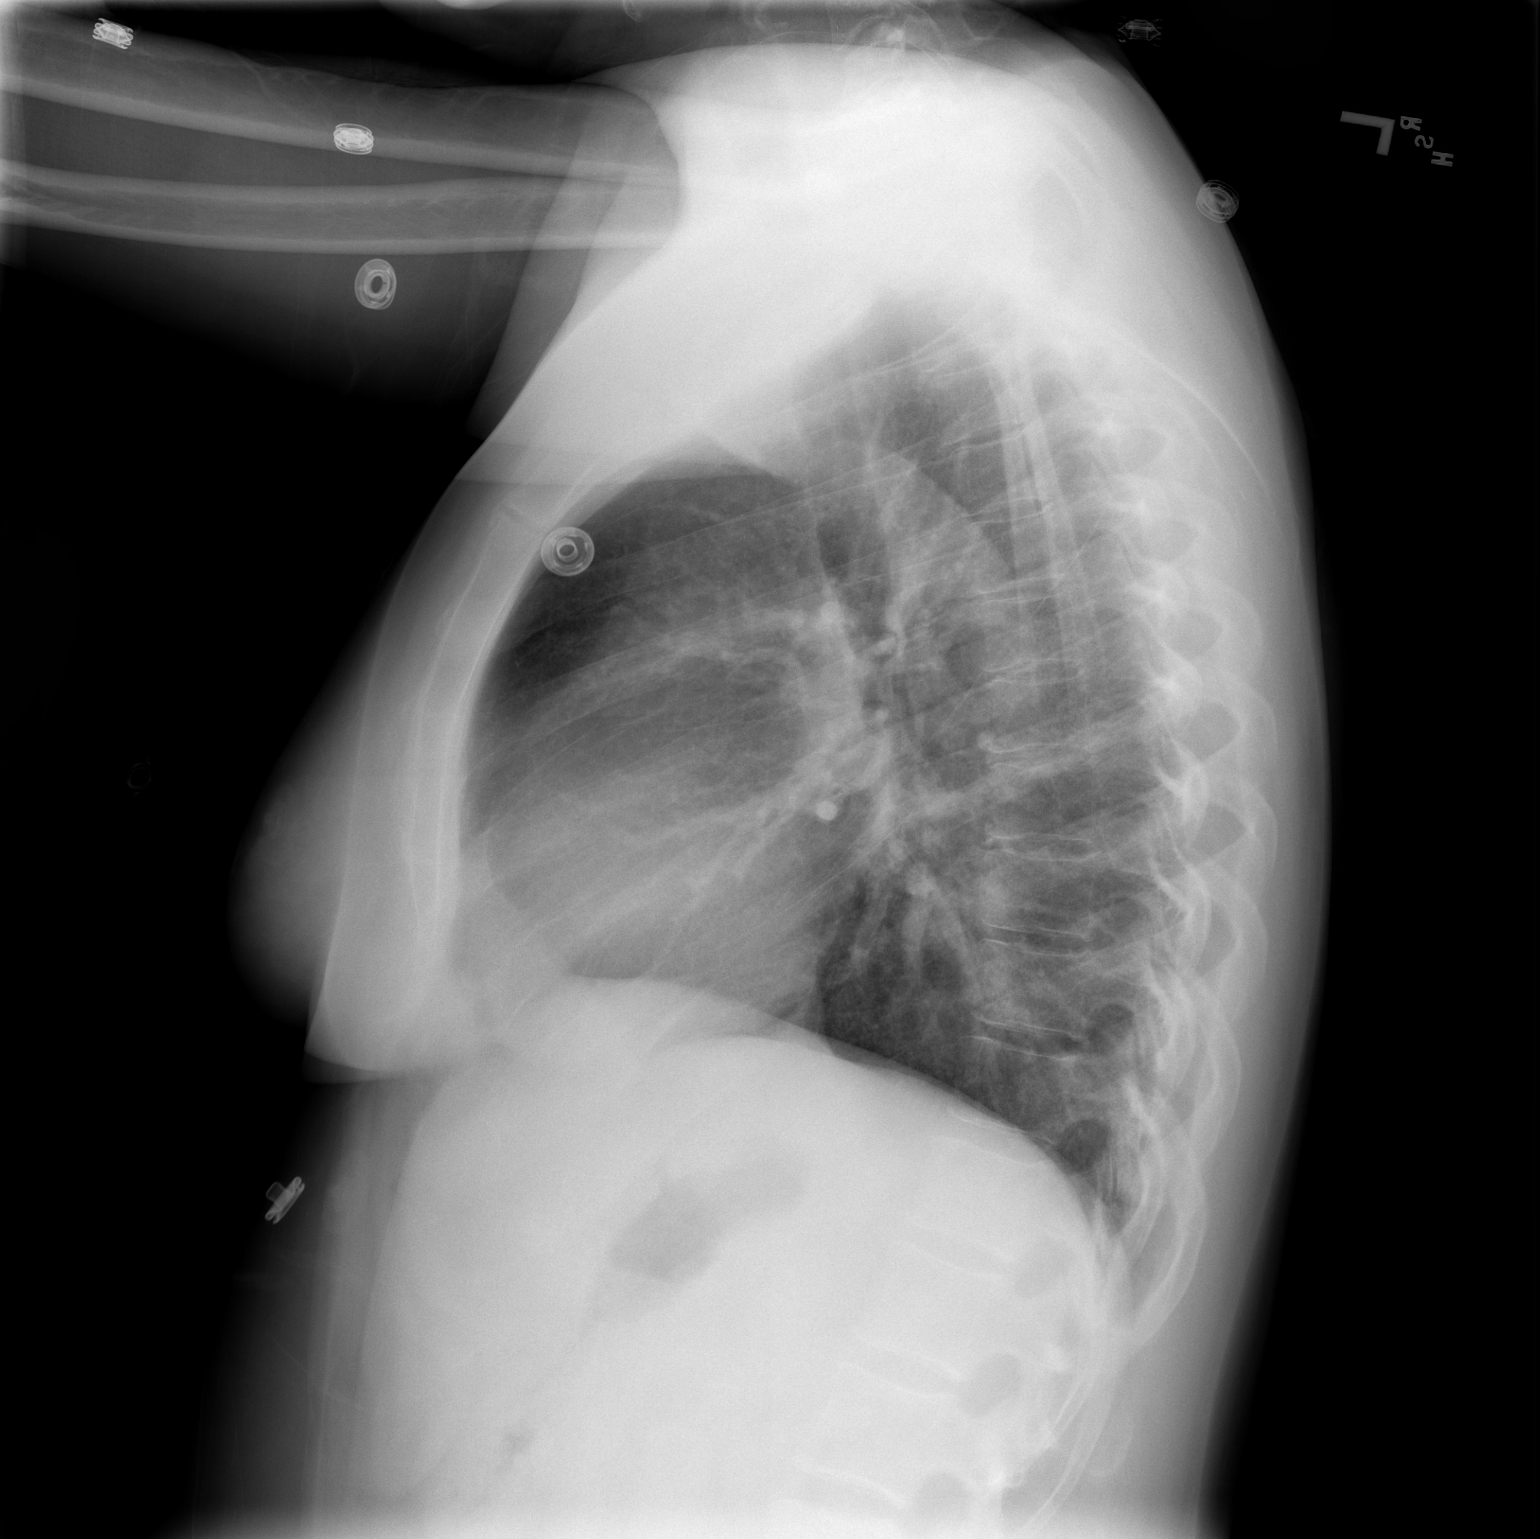

[2 of 2 positions shown; findings below may reference images not displayed]

FINDINGS: The cardiac silhouette, mediastinum, pulmonary
vasculature are within normal limits.  Both lungs are clear.
There is no acute bony abnormality.
IMPRESSION: There is no evidence of acute cardiac or pulmonary process.

## 2012-07-14 NOTE — Telephone Encounter (Signed)
Patient called c/o some nausea and feeling "woozy" since starting back on her HIV meds.  Advised that these issues will more than likely resolve after a couple of weeks, told her to eat smaller more frequent meals and to drink ginger ale to settle her stomach. She agreed to try this. Wendall Mola CMA

## 2012-07-21 ENCOUNTER — Ambulatory Visit: Payer: Self-pay | Admitting: Family Medicine

## 2012-07-28 ENCOUNTER — Ambulatory Visit: Payer: Self-pay | Admitting: Family Medicine

## 2012-08-21 ENCOUNTER — Other Ambulatory Visit: Payer: Self-pay

## 2012-08-25 ENCOUNTER — Other Ambulatory Visit: Payer: Self-pay

## 2012-09-04 ENCOUNTER — Ambulatory Visit: Payer: Self-pay | Admitting: Internal Medicine

## 2012-09-04 ENCOUNTER — Telehealth: Payer: Self-pay | Admitting: *Deleted

## 2012-09-04 NOTE — Telephone Encounter (Signed)
Called patient and rescheduled her lab appt, she no showed.  She will reschedule with Dr. Luciana Axe on Monday when she comes for her labs. Wendall Mola

## 2012-09-08 ENCOUNTER — Other Ambulatory Visit: Payer: Self-pay

## 2012-10-08 ENCOUNTER — Telehealth: Payer: Self-pay | Admitting: Family Medicine

## 2012-10-08 ENCOUNTER — Ambulatory Visit (INDEPENDENT_AMBULATORY_CARE_PROVIDER_SITE_OTHER): Payer: No Typology Code available for payment source | Admitting: Family Medicine

## 2012-10-08 VITALS — BP 173/96 | HR 65 | Temp 98.3°F | Ht 69.0 in | Wt 154.1 lb

## 2012-10-08 DIAGNOSIS — J111 Influenza due to unidentified influenza virus with other respiratory manifestations: Secondary | ICD-10-CM

## 2012-10-08 DIAGNOSIS — M545 Low back pain, unspecified: Secondary | ICD-10-CM

## 2012-10-08 DIAGNOSIS — M62838 Other muscle spasm: Secondary | ICD-10-CM

## 2012-10-08 DIAGNOSIS — I10 Essential (primary) hypertension: Secondary | ICD-10-CM

## 2012-10-08 LAB — POCT URINALYSIS DIPSTICK
Bilirubin, UA: NEGATIVE
Blood, UA: NEGATIVE
Ketones, UA: NEGATIVE
Nitrite, UA: NEGATIVE
pH, UA: 6

## 2012-10-08 MED ORDER — KETOROLAC TROMETHAMINE 60 MG/2ML IM SOLN
60.0000 mg | Freq: Once | INTRAMUSCULAR | Status: AC
  Administered 2012-10-08: 60 mg via INTRAMUSCULAR

## 2012-10-08 MED ORDER — BENZONATATE 100 MG PO CAPS: 100.0000 mg | ORAL_CAPSULE | Freq: Two times a day (BID) | ORAL | Status: DC | PRN

## 2012-10-08 MED ORDER — OSELTAMIVIR PHOSPHATE 75 MG PO CAPS: 75.0000 mg | ORAL_CAPSULE | Freq: Two times a day (BID) | ORAL | Status: AC

## 2012-10-08 MED ORDER — ONDANSETRON 8 MG PO TBDP: 8.0000 mg | ORAL_TABLET | Freq: Three times a day (TID) | ORAL | Status: DC | PRN

## 2012-10-08 MED ORDER — OSELTAMIVIR PHOSPHATE 75 MG PO CAPS: 75.0000 mg | ORAL_CAPSULE | Freq: Two times a day (BID) | ORAL | Status: DC

## 2012-10-08 MED ORDER — CYCLOBENZAPRINE HCL 10 MG PO TABS: 10.0000 mg | ORAL_TABLET | Freq: Three times a day (TID) | ORAL | Status: DC | PRN

## 2012-10-08 MED ORDER — IBUPROFEN 600 MG PO TABS: 600.0000 mg | ORAL_TABLET | Freq: Three times a day (TID) | ORAL | Status: DC | PRN

## 2012-10-08 NOTE — Patient Instructions (Addendum)
Ms. Bawa,  Thank you for coming in today.  It appears that you have the flu. For this take  tamiflu twice daily. zofran for nausea. Tessalon perles for cough.  May also take corcidin HBP.  Drink and eat as much as possible.   Be sure to take your BP medication. Avoid OTC cough/cold medicine as it will raise your BP.   You also have muscles spasms with radiculopathy: nerve pain.  Gave you toradol 60 mg IM today.  Also take flexeril as needed.  Also take ibuprofen 600 mg 2-3 times daily for next 5-7 days.    F/u if you are not getting better my Monday. Call and come in sooner if you develop fever, worsening pain or weakness.   Dr. Armen Pickup

## 2012-10-08 NOTE — Telephone Encounter (Signed)
Pt states that she cannot afford Tamaflu and wants to know what else she can do.  [name]

## 2012-10-08 NOTE — Assessment & Plan Note (Addendum)
A: related to acute viral illness vs simply concomitant cervical radiculopathy and recurrent low back pain. No evidence of spinal cord abscess. Weakness one exam is pain related rather than a focal neurological deficit.  P: toradol IM now  Also take flexeril as needed.  Also take ibuprofen 600 mg 2-3 times daily for next 5-7 days.

## 2012-10-08 NOTE — Assessment & Plan Note (Addendum)
A: suspect influenza given significant fatigues, aches, cough and GI upset. I considered serious opportunistic infection but CD4 count>1000. Vitals signs excellent, except for elevated BP. Neuro exam is not consistent with focal process.  P: tamiflu and symptomatic treatment:  zofran for nausea. Tessalon perles for cough.  May also take corcidin HBP.  Drink and eat as much as possible.

## 2012-10-08 NOTE — Assessment & Plan Note (Signed)
A: declined due to non compliance and taking OTC cough medicine.  P; Restart BP medication. Patient advised to stop current OTC cough medicine and advised to use tessalon perles or take corcidin HBP for cough.

## 2012-10-08 NOTE — Addendum Note (Signed)
Addended by: Radene Ou on: 10/08/2012 12:33 PM   Modules accepted: Orders

## 2012-10-08 NOTE — Progress Notes (Addendum)
Subjective:     Patient ID: Cheryl Thompson, female   DOB: [birth year], 47 y.o.   MRN: [id number]  HPI 47 yo HIV positive female presents for same day visit with acute onset of L upper and L lower extremity muscle spasms associated with weakness, numbness, tingling and burning sensations. Symptoms comes and go. They last for about 5 minutes, resolve for a few minutes, then restart. Symptoms started on Monday AM when she was getting ready for work. They have been unchanging. The severity is >10/10.   low back pain: started 2 days ago. b/l low back pain. She has a history of low back pain. She denies injury. Denies fecal/urinary incontinence, groin numbness. Pain radiates from L low back to L posterior thigh. She denies dysuria, frequency and urgency.   Cough: started 2 days ago. She developed cough that was initially productive x one day of mucus, then non-productive. Associated wtih nausea with emesis x 2 episodes Monday and yesterday, fatigue, and chills. She denies fever. Taking alka seltzer plus cold and sinus and dayquil for cough. Lives with her mom who is well. Did attend church on Sunday. Did not attend school.   She has not taken her antiretroviral or BP medications for the past 2 days.   Review of Systems As per HPI     Objective:   Physical Exam BP 173/96  Pulse 65  Temp(Src) 98.3 F (36.8 C) (Oral)  Ht 5\' 9"  (1.753 m)  Wt 154 lb 1.6 oz (69.899 kg)  BMI 22.75 kg/m2  SpO2 100% General appearance: alert, cooperative and no distress, intermittently tearful during exam.  Head: Normocephalic, without obvious abnormality, atraumatic Eyes: conjunctivae/corneas clear. PERRL, EOM's intact.  Throat: lips, mucosa, and tongue normal; teeth and gums normal Neck: moderate anterior cervical adenopathy, no carotid bruit, no JVD, supple, symmetrical, trachea midline and thyroid not enlarged, symmetric, no tenderness/mass/nodules + spurling on L side.  Back: Symmetric, b/l low back paraspinal  tenderness as well as b/l CVA tenderness.  negative straight leg test. Raising L leg does radiate pain down to L posterior knee.  Lungs: clear to auscultation bilaterally Heart: regular rate and rhythm, S1, S2 normal, no murmur, click, rub or gallop Abdomen: soft, mild RLQ tenderness w/o rebound or guarding; bowel sounds normal; no masses,  no organomegaly Neurologic: Mental status: Alert, oriented, thought content appropriate Cranial nerves: normal Sensory: normal Motor: 5/5 on R. 4+/5 on L. Marked pain during exam.  Reflexes: 2+ and symmetric Coordination: test for rapid alternating movements normal Gait: Antalgic  Negative Kernig and Brudzinski  CD4 absolute 1180 Lab Results  Component Value Date   CD4TCELL 45 05/20/2012        Assessment and Plan:

## 2012-10-08 NOTE — Telephone Encounter (Signed)
Called in tamiflu to [facility].  Called patient with this info. If she is still unable to obtain it, advised patient to follow the rest of my instructions as directed.  Informed her that she will get better w/o the tamiflu but I would prefer for her to have it so we can shorten the duration of illness.

## 2012-10-08 NOTE — Assessment & Plan Note (Addendum)
No red flags on exam. toradol in office. Motrin scheduled for next week.

## 2012-10-10 ENCOUNTER — Telehealth: Payer: Self-pay | Admitting: Family Medicine

## 2012-10-10 ENCOUNTER — Ambulatory Visit: Payer: No Typology Code available for payment source | Admitting: Family Medicine

## 2012-10-10 NOTE — Telephone Encounter (Signed)
Wants to speak to triage nurse

## 2012-10-10 NOTE — Telephone Encounter (Signed)
Patient is calling because she couldn't afford the Tamiflu so she is waiting the flu out.  She said that the doctor wanted to know if she wasn't feeling any better and she isn't.  She saw Dr. Armen Pickup.

## 2012-10-10 NOTE — Telephone Encounter (Signed)
Patient states she continues with body aches   pain in chest, back and rib cage. Medication not helping. No fever. She is feeling worse. Appointment scheduled for this afternoon.

## 2012-10-13 ENCOUNTER — Ambulatory Visit: Payer: No Typology Code available for payment source | Admitting: Family Medicine

## 2012-10-29 ENCOUNTER — Encounter: Payer: Self-pay | Admitting: Family Medicine

## 2012-10-29 ENCOUNTER — Ambulatory Visit (INDEPENDENT_AMBULATORY_CARE_PROVIDER_SITE_OTHER): Payer: Self-pay | Admitting: Family Medicine

## 2012-10-29 VITALS — BP 144/82 | HR 83 | Temp 99.0°F | Ht 69.0 in | Wt 152.0 lb

## 2012-10-29 DIAGNOSIS — M2550 Pain in unspecified joint: Secondary | ICD-10-CM

## 2012-10-29 DIAGNOSIS — Z8669 Personal history of other diseases of the nervous system and sense organs: Secondary | ICD-10-CM

## 2012-10-29 DIAGNOSIS — I10 Essential (primary) hypertension: Secondary | ICD-10-CM

## 2012-10-29 DIAGNOSIS — F1910 Other psychoactive substance abuse, uncomplicated: Secondary | ICD-10-CM

## 2012-10-29 LAB — CBC WITH DIFFERENTIAL/PLATELET
Basophils Relative: 0 % (ref 0–1)
Eosinophils Absolute: 0.1 10*3/uL (ref 0.0–0.7)
HCT: 35 % — ABNORMAL LOW (ref 36.0–46.0)
Hemoglobin: 11.9 g/dL — ABNORMAL LOW (ref 12.0–15.0)
MCH: 32.4 pg (ref 26.0–34.0)
MCHC: 34 g/dL (ref 30.0–36.0)
MCV: 95.4 fL (ref 78.0–100.0)
Monocytes Absolute: 0.6 10*3/uL (ref 0.1–1.0)
Monocytes Relative: 10 % (ref 3–12)

## 2012-10-29 LAB — COMPREHENSIVE METABOLIC PANEL
Albumin: 3.7 g/dL (ref 3.5–5.2)
Alkaline Phosphatase: 72 U/L (ref 39–117)
BUN: 14 mg/dL (ref 6–23)
Glucose, Bld: 73 mg/dL (ref 70–99)
Total Bilirubin: 0.5 mg/dL (ref 0.3–1.2)

## 2012-10-29 MED ORDER — LISINOPRIL-HYDROCHLOROTHIAZIDE 20-12.5 MG PO TABS: 1.0000 | ORAL_TABLET | Freq: Every morning | ORAL | Status: DC

## 2012-10-29 MED ORDER — DICLOFENAC SODIUM 75 MG PO TBEC: 75.0000 mg | DELAYED_RELEASE_TABLET | Freq: Two times a day (BID) | ORAL | Status: DC

## 2012-10-29 MED ORDER — CYCLOBENZAPRINE HCL 10 MG PO TABS: 10.0000 mg | ORAL_TABLET | Freq: Three times a day (TID) | ORAL | Status: DC | PRN

## 2012-10-29 MED ORDER — DIVALPROEX SODIUM ER 250 MG PO TB24: 250.0000 mg | ORAL_TABLET | Freq: Two times a day (BID) | ORAL | Status: DC

## 2012-10-29 NOTE — Assessment & Plan Note (Signed)
Restart lisinopril/hydrochlorothiazide

## 2012-10-29 NOTE — Assessment & Plan Note (Signed)
Differential includes gout, SLE, rheumatoid arthritis, other collagen vascular disease, or endocarditis.  Feel endocarditis is unlikely in this patient given the lack of Janeway lesions or splinter hemorrhages along with history of IV drug abuse. I will obtain labs as noted in the orders and see the patient back in one week. If nothing is immediately evident on her labs, I would consider a steroid injection of her knees she does have mild osteoarthritis bilaterally  (as evidenced by x-rays within the last year).

## 2012-10-29 NOTE — Progress Notes (Signed)
Patient ID: Cheryl Thompson, female   DOB: 04/21/1966, 47 y.o.   MRN: [id number] Subjective: The patient is a 47 y.o. year old female who presents today for arthralgias.  The patient presents today with a several month history of polyarthralgias. Arthralgias are located in hands, wrists, ankles, and knees bilaterally. There been no skin rashes, no particular photosensitivity, no fevers, no weakness. The patient is a substance abuser but denies any IV drug use. She reports that sometime, in years past, she was told that she had gout when someone did an arthrocentesis of her left knee.  The patient also reports that she is out of her seizure medications and has been for a last 3 weeks. She is not having any problems with seizures and has been on the same dose of medication for some time. She plans on seeing her infectious disease doctor tomorrow. She reports compliance with her HIV medications.  Finally, the patient is interested in resources for help with stopping substance abuse.  Patient's past medical, social, and family history were reviewed and updated as appropriate. History  Substance Use Topics  . Smoking status: Current Every Day Smoker -- 0.20 packs/day    Types: Cigarettes  . Smokeless tobacco: Never Used     Comment: in process of quitting/ 4 cigarettes per day  . Alcohol Use: 4.0 oz/week    8 drink(s) per week     Comment: Drinks a pint of hard alcohol at a time   Objective:  Filed Vitals:   10/29/12 1119  BP: 144/82  Pulse: 83  Temp: 99 F (37.2 C)   Gen: No acute distress, appropriate weight Skin: No rashes are evident Joints: Patient reports some tenderness on palpation of joints in her hands bilaterally. There is no evidence of effusion, there is no current redness, erythema, or warmth, and there is no obvious joint effusion of either knee.  Assessment/Plan:  Please also see individual problems in problem list for problem-specific plans.

## 2012-10-29 NOTE — Patient Instructions (Signed)
It was good to see you today! Lets run some labs to make sure you don't have anything like lupus or rheumatoid arthritis going on. Please come back to see me in a week to talk about results of your blood tests.

## 2012-10-29 NOTE — Assessment & Plan Note (Signed)
Will have social work contact her about resources.

## 2012-10-29 NOTE — Assessment & Plan Note (Signed)
Will restart the patient's Depakote. We will likely need he Depakote level to make certain that she is therapeutic.

## 2012-10-30 ENCOUNTER — Ambulatory Visit: Payer: Self-pay

## 2012-10-30 ENCOUNTER — Other Ambulatory Visit: Payer: Self-pay

## 2012-10-30 LAB — ANA: Anti Nuclear Antibody(ANA): POSITIVE — AB

## 2012-10-30 LAB — ANTI-NUCLEAR AB-TITER (ANA TITER): ANA Titer 1: NEGATIVE

## 2012-10-30 LAB — RHEUMATOID FACTOR: Rhuematoid fact SerPl-aCnc: <10 IU/mL (ref <=14)

## 2012-11-03 ENCOUNTER — Telehealth: Payer: Self-pay | Admitting: Family Medicine

## 2012-11-03 NOTE — Telephone Encounter (Signed)
Will forward to Dr Ritch 

## 2012-11-03 NOTE — Telephone Encounter (Signed)
Patient is calling for the results of her blood work. °

## 2012-11-04 NOTE — Telephone Encounter (Signed)
Pt calling again about her test results and if she is out she is giving permission to give results to her mother, Lovena Neighbours.

## 2012-11-05 NOTE — Telephone Encounter (Signed)
Explained lab results.  Mildly elevated LFTs, plan recheck in 3-4 months unless ID feels otherwise (she is hep C positive so this may just be related to chronic infection)  Pos ANA with neg titers of uncertain significance.  Mildly elevated ESR suggests possible inflammatory component.  Rec cont anti-inflammatories and come in as SDA if a joint swells particularly badly.  Would like fluid sample sent for crystals if possible.

## 2012-12-11 ENCOUNTER — Ambulatory Visit: Payer: Self-pay

## 2012-12-16 ENCOUNTER — Ambulatory Visit: Payer: Self-pay

## 2013-01-13 ENCOUNTER — Telehealth: Payer: Self-pay | Admitting: Family Medicine

## 2013-01-13 MED ORDER — AMLODIPINE BESYLATE 10 MG PO TABS: 5.0000 mg | ORAL_TABLET | Freq: Every morning | ORAL | Status: DC

## 2013-01-13 MED ORDER — LISINOPRIL-HYDROCHLOROTHIAZIDE 20-12.5 MG PO TABS: 1.0000 | ORAL_TABLET | Freq: Every morning | ORAL | Status: DC

## 2013-01-13 NOTE — Telephone Encounter (Signed)
Informed that rx was sent in

## 2013-01-13 NOTE — Telephone Encounter (Signed)
Patient is wanting refill on her BP medication. She moved to [place]. She would want the refill sent to the [facility] in [place]. JW

## 2013-01-19 ENCOUNTER — Telehealth: Payer: Self-pay | Admitting: *Deleted

## 2013-01-19 ENCOUNTER — Other Ambulatory Visit: Payer: Self-pay | Admitting: *Deleted

## 2013-01-19 DIAGNOSIS — B20 Human immunodeficiency virus [HIV] disease: Secondary | ICD-10-CM

## 2013-01-19 MED ORDER — RITONAVIR 100 MG PO TABS: 100.0000 mg | ORAL_TABLET | Freq: Every day | ORAL | Status: DC

## 2013-01-19 MED ORDER — DARUNAVIR ETHANOLATE 800 MG PO TABS: 800.0000 mg | ORAL_TABLET | Freq: Every day | ORAL | Status: DC

## 2013-01-19 MED ORDER — EMTRICITABINE-TENOFOVIR DF 200-300 MG PO TABS: 1.0000 | ORAL_TABLET | Freq: Every day | ORAL | Status: DC

## 2013-01-19 NOTE — Telephone Encounter (Signed)
Patient called and advised she is out of her medications. She wants me to send refills to the [facility] in [place]. She advised she has moved to [place] and is going to be seeing a doctor there.

## 2013-01-22 ENCOUNTER — Other Ambulatory Visit (INDEPENDENT_AMBULATORY_CARE_PROVIDER_SITE_OTHER): Payer: Self-pay

## 2013-01-22 ENCOUNTER — Other Ambulatory Visit: Payer: Self-pay | Admitting: *Deleted

## 2013-01-22 ENCOUNTER — Ambulatory Visit: Payer: Self-pay

## 2013-01-22 DIAGNOSIS — B20 Human immunodeficiency virus [HIV] disease: Secondary | ICD-10-CM

## 2013-01-22 LAB — COMPLETE METABOLIC PANEL WITH GFR
ALT: 57 U/L — ABNORMAL HIGH (ref 0–35)
BUN: 12 mg/dL (ref 6–23)
CO2: 29 mEq/L (ref 19–32)
Calcium: 9.3 mg/dL (ref 8.4–10.5)
Creat: 0.84 mg/dL (ref 0.50–1.10)
GFR, Est African American: >89 mL/min
GFR, Est Non African American: 83 mL/min
Glucose, Bld: 122 mg/dL — ABNORMAL HIGH (ref 70–99)
Total Bilirubin: 0.3 mg/dL (ref 0.3–1.2)

## 2013-01-22 LAB — CBC WITH DIFFERENTIAL/PLATELET
Eosinophils Absolute: 0.1 10*3/uL (ref 0.0–0.7)
Eosinophils Relative: 2 % (ref 0–5)
HCT: 32.5 % — ABNORMAL LOW (ref 36.0–46.0)
Lymphs Abs: 1.8 10*3/uL (ref 0.7–4.0)
MCH: 31.4 pg (ref 26.0–34.0)
MCV: 94.5 fL (ref 78.0–100.0)
Monocytes Absolute: 0.8 10*3/uL (ref 0.1–1.0)
Platelets: 159 10*3/uL (ref 150–400)
RBC: 3.44 MIL/uL — ABNORMAL LOW (ref 3.87–5.11)

## 2013-01-22 MED ORDER — RITONAVIR 100 MG PO TABS: 100.0000 mg | ORAL_TABLET | Freq: Every day | ORAL | Status: DC

## 2013-01-22 MED ORDER — EMTRICITABINE-TENOFOVIR DF 200-300 MG PO TABS: 1.0000 | ORAL_TABLET | Freq: Every day | ORAL | Status: DC

## 2013-01-22 MED ORDER — DARUNAVIR ETHANOLATE 800 MG PO TABS: 800.0000 mg | ORAL_TABLET | Freq: Every day | ORAL | Status: DC

## 2013-01-22 NOTE — Telephone Encounter (Signed)
ADAP 

## 2013-01-23 LAB — T-HELPER CELL (CD4) - (RCID CLINIC ONLY)
CD4 % Helper T Cell: 45 % (ref 33–55)
CD4 T Cell Abs: 870 uL (ref 400–2700)

## 2013-02-02 ENCOUNTER — Encounter: Payer: Self-pay | Admitting: Family Medicine

## 2013-02-09 ENCOUNTER — Other Ambulatory Visit: Payer: Self-pay | Admitting: *Deleted

## 2013-02-09 ENCOUNTER — Ambulatory Visit (INDEPENDENT_AMBULATORY_CARE_PROVIDER_SITE_OTHER): Payer: Self-pay | Admitting: Internal Medicine

## 2013-02-09 ENCOUNTER — Encounter: Payer: Self-pay | Admitting: Internal Medicine

## 2013-02-09 VITALS — BP 149/87 | HR 65 | Temp 98.0°F | Ht 69.0 in | Wt 160.0 lb

## 2013-02-09 DIAGNOSIS — B20 Human immunodeficiency virus [HIV] disease: Secondary | ICD-10-CM

## 2013-02-09 MED ORDER — EMTRICITABINE-TENOFOVIR DF 200-300 MG PO TABS: 1.0000 | ORAL_TABLET | Freq: Every day | ORAL | Status: DC

## 2013-02-09 MED ORDER — RITONAVIR 100 MG PO TABS: 100.0000 mg | ORAL_TABLET | Freq: Every day | ORAL | Status: DC

## 2013-02-09 MED ORDER — DARUNAVIR ETHANOLATE 800 MG PO TABS: 800.0000 mg | ORAL_TABLET | Freq: Every day | ORAL | Status: DC

## 2013-02-09 NOTE — Assessment & Plan Note (Addendum)
I discussed with her the need for treatment and prognosis for someone on daily therapy. She is going to go pick up her medications today. I will have her follow up in one month and recheck her labs at that visit. 25 minutes was spent with the patient including 15 minutes of face-to-face counseling and exam.

## 2013-02-09 NOTE — Progress Notes (Signed)
  Subjective:    Patient ID: Cheryl Thompson, female    DOB: [birth year], 47 y.o.   MRN: [id number]  HPI She comes in for followup of her HIV. She was last seen in December of 2013 after a long absence and I was going to start her on Prezista, Norvir and Truvada. She started taking the medication for about 2 months then stopped. She relapsed with mainly alcohol abuse. She though has now been clean for about 2 weeks. 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 She has problems with imbalance. She does have continued joint aches. She was diagnosed with gout in her left knee recently as well. She has not been on her medications recently. Her last labs done on July 10 confirm this. He is here with her aunt who is going to help her maintain better health.   Review of Systems  Constitutional: Negative for fever, appetite change, fatigue and unexpected weight change.  HENT: Negative for sore throat and trouble swallowing.   Respiratory: Negative for shortness of breath.   Cardiovascular: Negative for leg swelling.  Gastrointestinal: Negative for nausea, abdominal pain and diarrhea.  Musculoskeletal: Positive for arthralgias. Negative for myalgias, back pain and joint swelling.  Skin: Negative for rash.  Neurological: Negative for dizziness, light-headedness and headaches.  Hematological: Negative for adenopathy.  Psychiatric/Behavioral: Negative for dysphoric mood.       Objective:   Physical Exam  Constitutional: She appears well-developed and well-nourished. No distress.  HENT:  Mouth/Throat: No oropharyngeal exudate.  Eyes: Right eye exhibits no discharge. Left eye exhibits no discharge. No scleral icterus.  Cardiovascular: Normal rate, regular rhythm and normal heart sounds.   No murmur heard. Pulmonary/Chest: Effort normal and breath sounds normal. No respiratory distress. She has no wheezes.  Lymphadenopathy:    She has no cervical adenopathy.  Skin: Skin is  warm and dry. No rash noted.  Psychiatric: She has a normal mood and affect.          Assessment & Plan:

## 2013-02-23 ENCOUNTER — Other Ambulatory Visit: Payer: Self-pay | Admitting: Internal Medicine

## 2013-02-23 ENCOUNTER — Other Ambulatory Visit (INDEPENDENT_AMBULATORY_CARE_PROVIDER_SITE_OTHER): Payer: Self-pay

## 2013-02-23 DIAGNOSIS — B20 Human immunodeficiency virus [HIV] disease: Secondary | ICD-10-CM

## 2013-02-23 LAB — COMPREHENSIVE METABOLIC PANEL
Albumin: 3.8 g/dL (ref 3.5–5.2)
Alkaline Phosphatase: 59 U/L (ref 39–117)
BUN: 13 mg/dL (ref 6–23)
Calcium: 9.7 mg/dL (ref 8.4–10.5)
Chloride: 103 mEq/L (ref 96–112)
Glucose, Bld: 90 mg/dL (ref 70–99)
Potassium: 4.3 mEq/L (ref 3.5–5.3)
Sodium: 137 mEq/L (ref 135–145)
Total Protein: 8.4 g/dL — ABNORMAL HIGH (ref 6.0–8.3)

## 2013-02-23 LAB — CBC WITH DIFFERENTIAL/PLATELET
Basophils Relative: 1 % (ref 0–1)
HCT: 32.4 % — ABNORMAL LOW (ref 36.0–46.0)
Hemoglobin: 10.9 g/dL — ABNORMAL LOW (ref 12.0–15.0)
Lymphocytes Relative: 37 % (ref 12–46)
Lymphs Abs: 2.2 10*3/uL (ref 0.7–4.0)
Monocytes Absolute: 0.6 10*3/uL (ref 0.1–1.0)
Monocytes Relative: 10 % (ref 3–12)
Neutro Abs: 2.9 10*3/uL (ref 1.7–7.7)
Neutrophils Relative %: 49 % (ref 43–77)
RBC: 3.48 MIL/uL — ABNORMAL LOW (ref 3.87–5.11)
WBC: 6 10*3/uL (ref 4.0–10.5)

## 2013-02-24 LAB — T-HELPER CELL (CD4) - (RCID CLINIC ONLY): CD4 % Helper T Cell: 39 % (ref 33–55)

## 2013-02-24 LAB — HIV-1 RNA QUANT-NO REFLEX-BLD: HIV 1 RNA Quant: 473 copies/mL — ABNORMAL HIGH (ref <20)

## 2013-03-12 ENCOUNTER — Encounter: Payer: Self-pay | Admitting: Internal Medicine

## 2013-03-12 ENCOUNTER — Ambulatory Visit (HOSPITAL_COMMUNITY)
Admission: RE | Admit: 2013-03-12 | Discharge: 2013-03-12 | Disposition: A | Discharge status: Home / Self Care | Payer: Self-pay | Source: Ambulatory Visit | Attending: Internal Medicine | Admitting: Internal Medicine

## 2013-03-12 ENCOUNTER — Ambulatory Visit (INDEPENDENT_AMBULATORY_CARE_PROVIDER_SITE_OTHER): Payer: Self-pay | Admitting: Internal Medicine

## 2013-03-12 VITALS — BP 133/85 | HR 73 | Temp 98.3°F | Ht 69.0 in | Wt 165.0 lb

## 2013-03-12 DIAGNOSIS — M545 Low back pain, unspecified: Secondary | ICD-10-CM

## 2013-03-12 DIAGNOSIS — B20 Human immunodeficiency virus [HIV] disease: Secondary | ICD-10-CM

## 2013-03-12 DIAGNOSIS — R42 Dizziness and giddiness: Secondary | ICD-10-CM | POA: Insufficient documentation

## 2013-03-12 DIAGNOSIS — R29898 Other symptoms and signs involving the musculoskeletal system: Secondary | ICD-10-CM | POA: Insufficient documentation

## 2013-03-12 DIAGNOSIS — F1910 Other psychoactive substance abuse, uncomplicated: Secondary | ICD-10-CM

## 2013-03-12 NOTE — Assessment & Plan Note (Signed)
She continues to struggle with coping without alcohol and drugs. She has been seen by our substance abuse counselor today and will continue to be followed by him.

## 2013-03-12 NOTE — Progress Notes (Signed)
  Subjective:    Patient ID: Cheryl Thompson, female    DOB: 08-27-1965, 47 y.o.   MRN: [id number]  HPI Comes in here for followup of her HIV. After a long history of poor compliance related to drug and alcohol abuse she had restarted Prezista, Norvir and Truvada. She continues to take his medications and her last labs show significant improvement in her viral load and her CD4 count has remained in normal limits. She continues to struggle with her drug and alcohol abuse though has been drug and alcohol free for more than 2 weeks, she struggles to not having a coping mechanism. She does avoid her mother who has substance abuse issues. She does take her medication daily.    Additionally, she has had problems with left leg weakness and nerve pain that has been ongoing for about 3 months. She has difficulty walking.  No acute symptoms, no headache.   Review of Systems  Constitutional: Positive for activity change. Negative for fatigue.  HENT: Negative for sore throat and trouble swallowing.   Eyes: Negative for visual disturbance.  Respiratory: Negative for cough and shortness of breath.   Cardiovascular: Negative for chest pain and leg swelling.  Gastrointestinal: Negative for nausea, abdominal pain and diarrhea.  Musculoskeletal: Positive for back pain and gait problem. Negative for myalgias.  Skin: Negative for rash.  Neurological: Positive for weakness. Negative for dizziness, tremors, seizures, facial asymmetry, light-headedness, numbness and headaches.  Hematological: Negative for adenopathy.  Psychiatric/Behavioral: Positive for dysphoric mood. Negative for suicidal ideas. The patient is not nervous/anxious.        Objective:   Physical Exam  Constitutional: She is oriented to person, place, and time. She appears well-developed and well-nourished. No distress.  HENT:  Mouth/Throat: No oropharyngeal exudate.  Eyes: Right eye exhibits no discharge. Left eye exhibits no discharge. No  scleral icterus.  Cardiovascular: Normal rate, regular rhythm and normal heart sounds.   No murmur heard. Pulmonary/Chest: Effort normal and breath sounds normal. No respiratory distress. She has no wheezes.  Musculoskeletal: She exhibits no edema.  She does have weakness of her left leg  Lymphadenopathy:    She has no cervical adenopathy.  Neurological: She is alert and oriented to person, place, and time.  Skin: Skin is warm and dry. No rash noted. No erythema.  Psychiatric: She has a normal mood and affect. Her behavior is normal.          Assessment & Plan:

## 2013-03-12 NOTE — Assessment & Plan Note (Signed)
She has had significant back and leg pain. Symptoms sound like sciatica. She does have weakness in the left leg. I am going to check a CAT scan of her head to be sure there is nothing related to a CVA.  No acute intervention would be required since this has been ongoing for several months and no significant change. Also there is no headache or other concerning signs. If the CAT scan does not show any significant damage, I will have her referred to physical therapy once available though without insurance her options are limited.

## 2013-03-12 NOTE — Assessment & Plan Note (Signed)
I congratulated her on taking her medication so well. I am going to check her viral load again today as it should be undetectable by now. If not, I will have her followup soon for counseling and compliance.

## 2013-03-13 LAB — HIV-1 RNA QUANT-NO REFLEX-BLD
HIV 1 RNA Quant: 294 copies/mL — ABNORMAL HIGH (ref <20)
HIV-1 RNA Quant, Log: 2.47 {Log} — ABNORMAL HIGH (ref <1.30)

## 2013-03-13 LAB — T-HELPER CELL (CD4) - (RCID CLINIC ONLY): CD4 % Helper T Cell: 37 % (ref 33–55)

## 2013-03-19 ENCOUNTER — Ambulatory Visit: Payer: Self-pay | Admitting: *Deleted

## 2013-03-19 ENCOUNTER — Ambulatory Visit: Payer: Self-pay

## 2013-03-23 ENCOUNTER — Telehealth: Payer: Self-pay | Admitting: *Deleted

## 2013-03-23 NOTE — Telephone Encounter (Signed)
Pt notified. She spoke to [facility] and made an appointment to apply for an orange card. I will pursue PT options once she has this.  Andree Coss, RN   From: Gardiner Barefoot, MD Sent: 03/12/2013 3:47 PM To: Andree Coss, RN Let her know that the CT scan was ok. She will just need PT when/if that can be arranged.

## 2013-04-09 ENCOUNTER — Ambulatory Visit: Payer: Self-pay | Admitting: *Deleted

## 2013-04-09 ENCOUNTER — Encounter: Payer: Self-pay | Admitting: Internal Medicine

## 2013-04-09 ENCOUNTER — Ambulatory Visit (INDEPENDENT_AMBULATORY_CARE_PROVIDER_SITE_OTHER): Payer: Self-pay | Admitting: Internal Medicine

## 2013-04-09 VITALS — BP 150/91 | HR 56 | Temp 97.7°F | Ht 69.0 in | Wt 171.0 lb

## 2013-04-09 DIAGNOSIS — F3289 Other specified depressive episodes: Secondary | ICD-10-CM

## 2013-04-09 DIAGNOSIS — F1020 Alcohol dependence, uncomplicated: Secondary | ICD-10-CM

## 2013-04-09 DIAGNOSIS — F1910 Other psychoactive substance abuse, uncomplicated: Secondary | ICD-10-CM

## 2013-04-09 DIAGNOSIS — B20 Human immunodeficiency virus [HIV] disease: Secondary | ICD-10-CM

## 2013-04-09 DIAGNOSIS — F329 Major depressive disorder, single episode, unspecified: Secondary | ICD-10-CM

## 2013-04-09 NOTE — Progress Notes (Signed)
Patient ID: Cheryl Thompson, female   DOB: [birth year], 47 y.o.   MRN: [id number] Cole[place] kept appt, arrived ahead of time and presented oriented x 4. Pt was nicely dressed, arm in sling from torn rotator cuff. Affect flat. She reported depression since age 54 with occasional hospitalizations and significant SA history with years of excessive alcohol use. Pt reports recent gall bladder inflammation and likely surgery soon for gall bladder removal. Counselor asked pt to share what she has tried to help with her sobriety goal and desire to cope with depression. She states that while she did receive an anti-depressant, her mother threw the medication away. She acknowledges some enmeshment and unhealthy dynamics with both mom (alcoholic) and with her aunt (with whom she presently lives). Counselor educated pt on the potential benefits of medication therapy for depression. She was interested in pursuing this so counselor agreed to contact Misty Stanley with name(s) of local psychiatrist that can be funded through the [employer] for [place]. Aizlynn expressed interest in working with this counselor on improving her general coping skills and avoiding relapse. We will meet to explore skill building in this area and to assist with referrals regarding her depression. Pt was positive about pursuing counseling and psychiatric services. She affirmed that she has no thought to harm self today. Pt expresses desire to work on problems. We processed that she has a friend in [place] with whom she speaks regularly and who is a good support for [employer]. Pt can call this friend 24/7.  Mashell also draws on family support as needed. Next appt scheduled per her request on 10/14 as she will be in [place] again that day.   Cheryl Thompson, [facility]

## 2013-04-09 NOTE — Assessment & Plan Note (Signed)
She continues to be drug free

## 2013-04-09 NOTE — Assessment & Plan Note (Signed)
She is doing well in endorses good compliance. I am concerned that she still has some detectable virus but this will be monitored closely. She denies any problems with timing or missing doses therefore it does not seem to be related to compliance. She will return in 3 months.

## 2013-04-09 NOTE — Progress Notes (Signed)
  Subjective:    Patient ID: Cheryl Thompson, female    DOB: 1966-05-29, 47 y.o.   MRN: [id number]  HPI  Comes in here for followup of her HIV. After a long history of poor compliance related to drug and alcohol abuse she had restarted Prezista, Norvir and Truvada. She continues to take his medications and her last labs show significant improvement in her viral load and her CD4 count has remained in normal limits. She continues to struggle with her drug and alcohol abuse though has been drug and alcohol free for more than one month, she struggles to not having a coping mechanism. She does avoid her mother who has substance abuse issues. She does take her medication daily.    Additionally, she has had problems with left leg weakness and nerve pain that has been ongoing for about 3 months. She has difficulty walking.  No acute symptoms, no headache.  She also has been evaluated by gastroenterology who has recommended cholecystectomy 2 to poor emptying. She is going to see a Careers adviser in that regard. Her CD4 count remains good over thousand and 5 her load has remained detectable but just 294 copies. She endorses excellent compliance.   Review of Systems  Constitutional: Positive for activity change. Negative for fatigue.  HENT: Negative for sore throat and trouble swallowing.   Eyes: Negative for visual disturbance.  Respiratory: Negative for cough and shortness of breath.   Cardiovascular: Negative for chest pain and leg swelling.  Gastrointestinal: Negative for nausea, abdominal pain and diarrhea.  Musculoskeletal: Positive for back pain and gait problem. Negative for myalgias.  Skin: Negative for rash.  Neurological: Positive for weakness. Negative for dizziness, tremors, seizures, facial asymmetry, light-headedness, numbness and headaches.  Hematological: Negative for adenopathy.  Psychiatric/Behavioral: Positive for dysphoric mood. Negative for suicidal ideas. The patient is not nervous/anxious.         Objective:   Physical Exam  Constitutional: She is oriented to person, place, and time. She appears well-developed and well-nourished. No distress.  HENT:  Mouth/Throat: No oropharyngeal exudate.  Eyes: Right eye exhibits no discharge. Left eye exhibits no discharge. No scleral icterus.  Cardiovascular: Normal rate, regular rhythm and normal heart sounds.   No murmur heard. Pulmonary/Chest: Effort normal and breath sounds normal. No respiratory distress. She has no wheezes.  Musculoskeletal: She exhibits no edema.  She does have weakness of her left leg  Lymphadenopathy:    She has no cervical adenopathy.  Neurological: She is alert and oriented to person, place, and time.  Skin: Skin is warm and dry. No rash noted. No erythema.  Psychiatric: She has a normal mood and affect. Her behavior is normal.          Assessment & Plan:

## 2013-04-28 ENCOUNTER — Ambulatory Visit: Payer: Self-pay | Admitting: *Deleted

## 2013-05-07 ENCOUNTER — Telehealth: Payer: Self-pay | Admitting: *Deleted

## 2013-05-07 NOTE — Telephone Encounter (Signed)
Spoke to [place] by phone who is recuperating from gall bladder removal. She will schedule next appt with Clinical research associate following recuperation and when she can arrange transportation to [place]. She reports that she is somewhat improved since last meeting. Also has appt set-up with [facility] to eval for psychotropic medication for depression.

## 2013-05-21 ENCOUNTER — Other Ambulatory Visit: Payer: Self-pay

## 2013-05-31 ENCOUNTER — Emergency Department (HOSPITAL_COMMUNITY)
Admission: EM | Admit: 2013-05-31 | Discharge: 2013-05-31 | Disposition: A | Discharge status: Home / Self Care | Payer: Self-pay | Attending: Emergency Medicine | Admitting: Emergency Medicine

## 2013-05-31 ENCOUNTER — Encounter (HOSPITAL_COMMUNITY): Payer: Self-pay | Admitting: Emergency Medicine

## 2013-05-31 DIAGNOSIS — Z87891 Personal history of nicotine dependence: Secondary | ICD-10-CM | POA: Insufficient documentation

## 2013-05-31 DIAGNOSIS — Z8659 Personal history of other mental and behavioral disorders: Secondary | ICD-10-CM | POA: Insufficient documentation

## 2013-05-31 DIAGNOSIS — I10 Essential (primary) hypertension: Secondary | ICD-10-CM | POA: Insufficient documentation

## 2013-05-31 DIAGNOSIS — K625 Hemorrhage of anus and rectum: Secondary | ICD-10-CM | POA: Insufficient documentation

## 2013-05-31 DIAGNOSIS — Z79899 Other long term (current) drug therapy: Secondary | ICD-10-CM | POA: Insufficient documentation

## 2013-05-31 DIAGNOSIS — Z8619 Personal history of other infectious and parasitic diseases: Secondary | ICD-10-CM | POA: Insufficient documentation

## 2013-05-31 DIAGNOSIS — Z21 Asymptomatic human immunodeficiency virus [HIV] infection status: Secondary | ICD-10-CM | POA: Insufficient documentation

## 2013-05-31 DIAGNOSIS — Z792 Long term (current) use of antibiotics: Secondary | ICD-10-CM | POA: Insufficient documentation

## 2013-05-31 DIAGNOSIS — Z880 Allergy status to penicillin: Secondary | ICD-10-CM | POA: Insufficient documentation

## 2013-05-31 DIAGNOSIS — Z8669 Personal history of other diseases of the nervous system and sense organs: Secondary | ICD-10-CM | POA: Insufficient documentation

## 2013-05-31 DIAGNOSIS — Z8719 Personal history of other diseases of the digestive system: Secondary | ICD-10-CM | POA: Insufficient documentation

## 2013-05-31 HISTORY — DX: Alcoholic hepatitis without ascites: K70.10

## 2013-05-31 HISTORY — DX: Unspecified viral hepatitis C without hepatic coma: B19.20

## 2013-05-31 LAB — COMPREHENSIVE METABOLIC PANEL
ALT: 297 U/L — ABNORMAL HIGH (ref 0–35)
AST: 311 U/L — ABNORMAL HIGH (ref 0–37)
Albumin: 3.6 g/dL (ref 3.5–5.2)
Alkaline Phosphatase: 119 U/L — ABNORMAL HIGH (ref 39–117)
BUN: 18 mg/dL (ref 6–23)
CO2: 27 mEq/L (ref 19–32)
Calcium: 9.3 mg/dL (ref 8.4–10.5)
Chloride: 100 mEq/L (ref 96–112)
Creatinine, Ser: 1.17 mg/dL — ABNORMAL HIGH (ref 0.50–1.10)
GFR calc Af Amer: 63 mL/min — ABNORMAL LOW (ref >90)
GFR calc non Af Amer: 54 mL/min — ABNORMAL LOW (ref >90)
Glucose, Bld: 95 mg/dL (ref 70–99)
Potassium: 3.6 mEq/L (ref 3.5–5.1)
Sodium: 135 mEq/L (ref 135–145)
Total Bilirubin: 0.6 mg/dL (ref 0.3–1.2)
Total Protein: 9.8 g/dL — ABNORMAL HIGH (ref 6.0–8.3)

## 2013-05-31 LAB — CBC WITH DIFFERENTIAL/PLATELET
Basophils Absolute: 0 10*3/uL (ref 0.0–0.1)
Basophils Relative: 0 % (ref 0–1)
Eosinophils Absolute: 0.1 10*3/uL (ref 0.0–0.7)
Eosinophils Relative: 1 % (ref 0–5)
HCT: 36.5 % (ref 36.0–46.0)
Hemoglobin: 12.6 g/dL (ref 12.0–15.0)
Lymphocytes Relative: 39 % (ref 12–46)
Lymphs Abs: 5.3 10*3/uL — ABNORMAL HIGH (ref 0.7–4.0)
MCH: 32.5 pg (ref 26.0–34.0)
MCHC: 34.5 g/dL (ref 30.0–36.0)
MCV: 94.1 fL (ref 78.0–100.0)
Monocytes Absolute: 1.1 10*3/uL — ABNORMAL HIGH (ref 0.1–1.0)
Monocytes Relative: 8 % (ref 3–12)
Neutro Abs: 7 10*3/uL (ref 1.7–7.7)
Neutrophils Relative %: 52 % (ref 43–77)
Platelets: 144 10*3/uL — ABNORMAL LOW (ref 150–400)
RBC: 3.88 MIL/uL (ref 3.87–5.11)
RDW: 14.7 % (ref 11.5–15.5)
WBC: 13.5 10*3/uL — ABNORMAL HIGH (ref 4.0–10.5)

## 2013-05-31 LAB — PROTIME-INR
INR: 1.12 (ref 0.00–1.49)
Prothrombin Time: 14.2 seconds (ref 11.6–15.2)

## 2013-05-31 MED ORDER — MORPHINE SULFATE 4 MG/ML IJ SOLN
6.0000 mg | INTRAMUSCULAR | Status: DC | PRN
  Administered 2013-05-31: 6 mg via INTRAVENOUS
  Filled 2013-05-31: qty 2

## 2013-05-31 MED ORDER — SODIUM CHLORIDE 0.9 % IV BOLUS (SEPSIS)
1000.0000 mL | Freq: Once | INTRAVENOUS | Status: AC
  Administered 2013-05-31: 1000 mL via INTRAVENOUS

## 2013-05-31 MED ORDER — ONDANSETRON HCL 4 MG/2ML IJ SOLN
4.0000 mg | Freq: Once | INTRAMUSCULAR | Status: AC
  Administered 2013-05-31: 4 mg via INTRAVENOUS
  Filled 2013-05-31: qty 2

## 2013-05-31 NOTE — ED Notes (Addendum)
Pt states noticed dark red blood in the toilet this morning. Pt also reports abdominal and groin pain that began this morning. Pt reports diarrhea, however denies nausea or emesis. Pt states she has a history of drug and alcohol abuse and has been diagnosed with acute alcoholic liver. Pt states that she smoked marijuana, smoked cocaine, and drank 20 ounces of moon shine. Pt states she is worried about relapse. Pt denies vaginal bleeding or discharge as well as HI/SI.

## 2013-06-04 NOTE — ED Provider Notes (Signed)
CSN: [id number]     Arrival date & time 05/31/13  1156 History   First MD Initiated Contact with Patient 05/31/13 1215     Chief Complaint  Patient presents with  . Rectal Bleeding  . Abdominal Pain   (Consider location/radiation/quality/duration/timing/severity/associated sxs/prior Treatment) HPI  47 year old female with rectal bleeding. Noticed this morning when she was having a bowel movement. Dark red blood noted mixed with stool. Crampy lower abdominal pain. Stool is very loose. No blood thinning medications, but patient does have a history of alcohol abuse and states she just relapsed. No dizziness, lightheadedness or shortness of breath. Denies history of significant GI bleed. Never had a colonoscopy. Past medical history significant for alcohol abuse as above, hepatitis C and HIV. She reports compliance with her medications.  Past Medical History  Diagnosis Date  . HIV (human immunodeficiency virus infection)   . Hypertension   . Depression   . Seizures   . Acute alcoholic hepatitis   . Hepatitis C    Past Surgical History  Procedure Laterality Date  . Total abdominal hysterectomy w/ bilateral salpingoophorectomy  2006  . Right knee surgery      around 47 years old, for ? chronic dislocation  . Abdominal hysterectomy     Family History  Problem Relation Age of Onset  . Stroke Mother   . Stroke Father    History  Substance Use Topics  . Smoking status: Former Smoker -- 0.10 packs/day    Types: Cigarettes    Start date: 02/01/2013  . Smokeless tobacco: Never Used     Comment: in process of quitting/ 4 cigarettes per day  . Alcohol Use: No     Comment: Drinks a pint of hard alcohol at a time/recently quit   OB History   Grav Para Term Preterm Abortions TAB SAB Ect Mult Living                 Review of Systems  All systems reviewed and negative, other than as noted in HPI.   Allergies  Penicillins and Naproxen  Home Medications   Current Outpatient Rx   Name  Route  Sig  Dispense  Refill  . cholecalciferol (VITAMIN D) 1000 UNITS tablet   Oral   Take 1,000 Units by mouth every morning.          . clindamycin (CLEOCIN) 600 MG/50ML IVPB   Intravenous   Inject 600 mg into the vein 3 (three) times daily.         . Darunavir Ethanolate (PREZISTA) 800 MG tablet   Oral   Take 1 tablet (800 mg total) by mouth daily.   30 tablet   5   . divalproex (DEPAKOTE ER) 250 MG 24 hr tablet   Oral   Take 250 mg by mouth 3 (three) times daily.         Marland Kitchen emtricitabine-tenofovir (TRUVADA) 200-300 MG per tablet   Oral   Take 1 tablet by mouth daily.   30 tablet   5   . oxyCODONE-acetaminophen (PERCOCET/ROXICET) 5-325 MG per tablet   Oral   Take by mouth every 4 (four) hours as needed for severe pain.         . ritonavir (NORVIR) 100 MG TABS   Oral   Take 1 tablet (100 mg total) by mouth daily.   30 tablet   5   . Thiamine HCl (VITAMIN B-1) 250 MG tablet   Oral   Take 250 mg by mouth daily.  BP 118/77  Pulse 89  Temp(Src) 98.7 F (37.1 C) (Oral)  Resp 20  SpO2 99% Physical Exam  Nursing note and vitals reviewed. Constitutional: She is oriented to person, place, and time. She appears well-developed and well-nourished. No distress.  HENT:  Head: Normocephalic and atraumatic.  Eyes: Conjunctivae are normal. Right eye exhibits no discharge. Left eye exhibits no discharge.  Neck: Neck supple.  Cardiovascular: Normal rate, regular rhythm and normal heart sounds.  Exam reveals no gallop and no friction rub.   No murmur heard. Pulmonary/Chest: Effort normal and breath sounds normal. No respiratory distress.  Abdominal: Soft. She exhibits no distension. There is no tenderness.  Genitourinary:  Rectal: no lesions noted. Normal tone. No stool in vault. Brown stool on glove. Heme neg.   Musculoskeletal: She exhibits no edema and no tenderness.  Neurological: She is alert and oriented to person, place, and time. No cranial  nerve deficit. She exhibits normal muscle tone. Coordination normal.  Pleasantly intoxicated. Speech is clear. Answering questions appropriately.  Skin: Skin is warm and dry.  Psychiatric: Her behavior is normal. Thought content normal.    ED Course  Procedures (including critical care time) Labs Review Labs Reviewed  CBC WITH DIFFERENTIAL - Abnormal; Notable for the following:    WBC 13.5 (*)    Platelets 144 (*)    Lymphs Abs 5.3 (*)    Monocytes Absolute 1.1 (*)    All other components within normal limits  COMPREHENSIVE METABOLIC PANEL - Abnormal; Notable for the following:    Creatinine, Ser 1.17 (*)    Total Protein 9.8 (*)    AST 311 (*)    ALT 297 (*)    Alkaline Phosphatase 119 (*)    GFR calc non Af Amer 54 (*)    GFR calc Af Amer 63 (*)    All other components within normal limits  PROTIME-INR   Imaging Review No results found.  EKG Interpretation   None       MDM   1. Rectal bleeding    47 year old female with rectal bleeding. No blood noted grossly on examination. Hemoccult negative. H&H is normal. History of alcohol abuse and minimally thrombocytopenic. She should not be at significant risk for bleeding with platelets at this level. INR is normal. I feel she is appropriate for outpatient followup with GI/her PCP. Return precautions were discussed.    Raeford Razor, MD 06/04/13 0100

## 2013-07-13 ENCOUNTER — Ambulatory Visit: Payer: Self-pay | Admitting: Internal Medicine

## 2013-07-23 ENCOUNTER — Ambulatory Visit: Payer: Self-pay

## 2013-07-23 ENCOUNTER — Ambulatory Visit: Payer: Self-pay | Admitting: Internal Medicine

## 2013-08-18 ENCOUNTER — Ambulatory Visit: Payer: Self-pay

## 2013-08-18 ENCOUNTER — Telehealth: Payer: Self-pay | Admitting: *Deleted

## 2013-08-18 ENCOUNTER — Ambulatory Visit: Payer: Self-pay | Admitting: Internal Medicine

## 2013-08-18 NOTE — Telephone Encounter (Signed)
Unable to notify patient about her missed appt, phone not in service. Patient also needs to renew [employer] and [employer]. Cheryl Thompson

## 2013-09-09 ENCOUNTER — Ambulatory Visit: Payer: Self-pay

## 2013-09-29 ENCOUNTER — Ambulatory Visit: Payer: Self-pay

## 2013-09-29 ENCOUNTER — Other Ambulatory Visit: Payer: Self-pay

## 2013-10-29 ENCOUNTER — Ambulatory Visit: Payer: Self-pay

## 2013-10-29 ENCOUNTER — Ambulatory Visit: Payer: Self-pay | Admitting: Internal Medicine

## 2013-10-29 ENCOUNTER — Telehealth: Payer: Self-pay | Admitting: *Deleted

## 2013-10-29 NOTE — Telephone Encounter (Signed)
Patient no showed, unable to leave message.  Will make referral to [facility].

## 2013-11-26 ENCOUNTER — Other Ambulatory Visit (INDEPENDENT_AMBULATORY_CARE_PROVIDER_SITE_OTHER): Payer: Self-pay

## 2013-11-26 DIAGNOSIS — B20 Human immunodeficiency virus [HIV] disease: Secondary | ICD-10-CM

## 2013-11-27 LAB — T-HELPER CELL (CD4) - (RCID CLINIC ONLY)
CD4 T CELL HELPER: 40 % (ref 33–55)
CD4 T Cell Abs: 940 /uL (ref 400–2700)

## 2013-11-27 LAB — HIV-1 RNA QUANT-NO REFLEX-BLD: HIV-1 RNA Quant, Log: <1.3 {Log} (ref <1.30)

## 2013-11-28 ENCOUNTER — Emergency Department (HOSPITAL_COMMUNITY): Payer: Self-pay

## 2013-11-28 ENCOUNTER — Emergency Department (HOSPITAL_COMMUNITY)
Admission: EM | Admit: 2013-11-28 | Discharge: 2013-11-29 | Disposition: A | Discharge status: Home / Self Care | Payer: Self-pay | Attending: Emergency Medicine | Admitting: Emergency Medicine

## 2013-11-28 ENCOUNTER — Encounter (HOSPITAL_COMMUNITY): Payer: Self-pay | Admitting: Emergency Medicine

## 2013-11-28 DIAGNOSIS — F329 Major depressive disorder, single episode, unspecified: Secondary | ICD-10-CM | POA: Insufficient documentation

## 2013-11-28 DIAGNOSIS — R0602 Shortness of breath: Secondary | ICD-10-CM | POA: Insufficient documentation

## 2013-11-28 DIAGNOSIS — R112 Nausea with vomiting, unspecified: Secondary | ICD-10-CM | POA: Insufficient documentation

## 2013-11-28 DIAGNOSIS — Z79899 Other long term (current) drug therapy: Secondary | ICD-10-CM | POA: Insufficient documentation

## 2013-11-28 DIAGNOSIS — M549 Dorsalgia, unspecified: Secondary | ICD-10-CM | POA: Insufficient documentation

## 2013-11-28 DIAGNOSIS — R109 Unspecified abdominal pain: Secondary | ICD-10-CM

## 2013-11-28 DIAGNOSIS — Z87891 Personal history of nicotine dependence: Secondary | ICD-10-CM | POA: Insufficient documentation

## 2013-11-28 DIAGNOSIS — G40909 Epilepsy, unspecified, not intractable, without status epilepticus: Secondary | ICD-10-CM | POA: Insufficient documentation

## 2013-11-28 DIAGNOSIS — R079 Chest pain, unspecified: Secondary | ICD-10-CM | POA: Insufficient documentation

## 2013-11-28 DIAGNOSIS — Z8619 Personal history of other infectious and parasitic diseases: Secondary | ICD-10-CM | POA: Insufficient documentation

## 2013-11-28 DIAGNOSIS — R1084 Generalized abdominal pain: Secondary | ICD-10-CM | POA: Insufficient documentation

## 2013-11-28 DIAGNOSIS — Z3202 Encounter for pregnancy test, result negative: Secondary | ICD-10-CM | POA: Insufficient documentation

## 2013-11-28 DIAGNOSIS — Z880 Allergy status to penicillin: Secondary | ICD-10-CM | POA: Insufficient documentation

## 2013-11-28 DIAGNOSIS — I10 Essential (primary) hypertension: Secondary | ICD-10-CM | POA: Insufficient documentation

## 2013-11-28 DIAGNOSIS — Z21 Asymptomatic human immunodeficiency virus [HIV] infection status: Secondary | ICD-10-CM | POA: Insufficient documentation

## 2013-11-28 DIAGNOSIS — F3289 Other specified depressive episodes: Secondary | ICD-10-CM | POA: Insufficient documentation

## 2013-11-28 DIAGNOSIS — Z8719 Personal history of other diseases of the digestive system: Secondary | ICD-10-CM | POA: Insufficient documentation

## 2013-11-28 DIAGNOSIS — R000 Tachycardia, unspecified: Secondary | ICD-10-CM | POA: Insufficient documentation

## 2013-11-28 DIAGNOSIS — Z792 Long term (current) use of antibiotics: Secondary | ICD-10-CM | POA: Insufficient documentation

## 2013-11-28 LAB — COMPREHENSIVE METABOLIC PANEL
ALBUMIN: 3.4 g/dL — AB (ref 3.5–5.2)
ALT: 72 U/L — AB (ref 0–35)
AST: 94 U/L — AB (ref 0–37)
Alkaline Phosphatase: 108 U/L (ref 39–117)
BUN: 10 mg/dL (ref 6–23)
CALCIUM: 9.1 mg/dL (ref 8.4–10.5)
CO2: 23 meq/L (ref 19–32)
Chloride: 101 mEq/L (ref 96–112)
Creatinine, Ser: 1 mg/dL (ref 0.50–1.10)
GFR calc Af Amer: 76 mL/min — ABNORMAL LOW (ref >90)
GFR, EST NON AFRICAN AMERICAN: 66 mL/min — AB (ref >90)
Glucose, Bld: 88 mg/dL (ref 70–99)
Potassium: 3.4 mEq/L — ABNORMAL LOW (ref 3.7–5.3)
SODIUM: 141 meq/L (ref 137–147)
Total Bilirubin: 0.4 mg/dL (ref 0.3–1.2)
Total Protein: 8.5 g/dL — ABNORMAL HIGH (ref 6.0–8.3)

## 2013-11-28 LAB — CBC WITH DIFFERENTIAL/PLATELET
BASOS PCT: 0 % (ref 0–1)
Basophils Absolute: 0 10*3/uL (ref 0.0–0.1)
Eosinophils Absolute: 0.1 10*3/uL (ref 0.0–0.7)
Eosinophils Relative: 2 % (ref 0–5)
HCT: 34.4 % — ABNORMAL LOW (ref 36.0–46.0)
Hemoglobin: 11.4 g/dL — ABNORMAL LOW (ref 12.0–15.0)
LYMPHS PCT: 43 % (ref 12–46)
Lymphs Abs: 3.1 10*3/uL (ref 0.7–4.0)
MCH: 32.9 pg (ref 26.0–34.0)
MCHC: 33.1 g/dL (ref 30.0–36.0)
MCV: 99.1 fL (ref 78.0–100.0)
Monocytes Absolute: 0.7 10*3/uL (ref 0.1–1.0)
Monocytes Relative: 10 % (ref 3–12)
NEUTROS ABS: 3.3 10*3/uL (ref 1.7–7.7)
Neutrophils Relative %: 45 % (ref 43–77)
Platelets: 122 10*3/uL — ABNORMAL LOW (ref 150–400)
RBC: 3.47 MIL/uL — ABNORMAL LOW (ref 3.87–5.11)
RDW: 14 % (ref 11.5–15.5)
WBC: 7.3 10*3/uL (ref 4.0–10.5)

## 2013-11-28 LAB — URINE MICROSCOPIC-ADD ON

## 2013-11-28 LAB — I-STAT TROPONIN, ED: TROPONIN I, POC: 0.01 ng/mL (ref 0.00–0.08)

## 2013-11-28 LAB — URINALYSIS, ROUTINE W REFLEX MICROSCOPIC
BILIRUBIN URINE: NEGATIVE
Glucose, UA: NEGATIVE mg/dL
Hgb urine dipstick: NEGATIVE
Ketones, ur: NEGATIVE mg/dL
NITRITE: NEGATIVE
Protein, ur: NEGATIVE mg/dL
SPECIFIC GRAVITY, URINE: 1.045 — AB (ref 1.005–1.030)
UROBILINOGEN UA: 0.2 mg/dL (ref 0.0–1.0)
pH: 6 (ref 5.0–8.0)

## 2013-11-28 LAB — LIPASE, BLOOD: Lipase: 64 U/L — ABNORMAL HIGH (ref 11–59)

## 2013-11-28 MED ORDER — MORPHINE SULFATE 4 MG/ML IJ SOLN
4.0000 mg | Freq: Once | INTRAMUSCULAR | Status: AC
Start: 2013-11-28 — End: 2013-11-28
  Administered 2013-11-28: 4 mg via INTRAVENOUS
  Filled 2013-11-28: qty 1

## 2013-11-28 MED ORDER — MORPHINE SULFATE 4 MG/ML IJ SOLN
4.0000 mg | Freq: Once | INTRAMUSCULAR | Status: AC
  Administered 2013-11-28: 4 mg via INTRAVENOUS
  Filled 2013-11-28: qty 1

## 2013-11-28 MED ORDER — ONDANSETRON 4 MG PO TBDP
8.0000 mg | ORAL_TABLET | Freq: Once | ORAL | Status: AC
  Administered 2013-11-28: 8 mg via ORAL
  Filled 2013-11-28: qty 2

## 2013-11-28 MED ORDER — ONDANSETRON HCL 4 MG/2ML IJ SOLN
4.0000 mg | Freq: Once | INTRAMUSCULAR | Status: AC
  Administered 2013-11-28: 4 mg via INTRAVENOUS
  Filled 2013-11-28: qty 2

## 2013-11-28 MED ORDER — IOHEXOL 350 MG/ML SOLN
100.0000 mL | Freq: Once | INTRAVENOUS | Status: AC | PRN
  Administered 2013-11-28: 100 mL via INTRAVENOUS

## 2013-11-28 NOTE — ED Notes (Signed)
Pt c/o lower back pain since 0400 this morning, shortness of breath since 45 minutes ago, nausea and cough. Pt actively  vomiting in triage.

## 2013-11-28 NOTE — ED Provider Notes (Signed)
CSN: [id number]     Arrival date & time 11/28/13  1947 History   First MD Initiated Contact with Patient 11/28/13 2010     Chief Complaint  Patient presents with  . Back Pain  . Shortness of Breath  . Emesis     (Consider location/radiation/quality/duration/timing/severity/associated sxs/prior Treatment) HPI Comments: 48 y/o female with a past medical history of HIV, hypertension, hepatitis C, seizures and depression presents to the emergency department complaining of sudden onset low back pain beginning around 4:00 this morning with associated shortness of breath, nausea and vomiting beginning 45 minutes prior to arrival. Pain located in her lower back, radiating towards her upper back, worse with walking. Denies pain, numbness or tingling radiating down her extremities, no loss of control bowels or bladder or saddle anesthesia. States her urine is slightly decreased. Denies fever or chills. States she has a sensation that her chest is too small and she cannot breathe and has slight mid-sternal chest pain. Denies hx of IV drug abuse.  Patient is a 48 y.o. female presenting with back pain, shortness of breath, and vomiting. The history is provided by the patient.  Back Pain Associated symptoms: chest pain   Shortness of Breath Associated symptoms: chest pain and vomiting   Emesis   Past Medical History  Diagnosis Date  . HIV (human immunodeficiency virus infection)   . Hypertension   . Depression   . Seizures   . Acute alcoholic hepatitis   . Hepatitis C    Past Surgical History  Procedure Laterality Date  . Total abdominal hysterectomy w/ bilateral salpingoophorectomy  2006  . 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 Abdominal hysterectomy     Family History  Problem Relation Age of Onset  . Stroke Mother   . Stroke Father    History  Substance Use Topics  . Smoking status: Former Smoker -- 0.10 packs/day    Types: Cigarettes    Start  date: 02/01/2013  . Smokeless tobacco: Never Used     Comment: in process of quitting/ 4 cigarettes per day  . Alcohol Use: No     Comment: Drinks a pint of hard alcohol at a time/recently quit   OB History   Grav Para Term Preterm Abortions TAB SAB Ect Mult Living                 Review of Systems  Respiratory: Positive for shortness of breath.   Cardiovascular: Positive for chest pain.  Gastrointestinal: Positive for nausea and vomiting.  Musculoskeletal: Positive for back pain.  All other systems reviewed and are negative.     Allergies  Penicillins and Naproxen  Home Medications   Prior to Admission medications   Medication Sig Start Date End Date Taking? Authorizing Provider  cholecalciferol (VITAMIN D) 1000 UNITS tablet Take 1,000 Units by mouth every morning.     Historical Provider, MD  clindamycin (CLEOCIN) 600 MG/50ML IVPB Inject 600 mg into the vein 3 (three) times daily.    Historical Provider, MD  Darunavir Ethanolate (PREZISTA) 800 MG tablet Take 1 tablet (800 mg total) by mouth daily. 02/09/13   Thayer Headings, MD  divalproex (DEPAKOTE ER) 250 MG 24 hr tablet Take 250 mg by mouth 3 (three) times daily. 10/29/12   Vivi Ferns, MD  emtricitabine-tenofovir (TRUVADA) 200-300 MG per tablet Take 1 tablet by mouth daily. 02/09/13   Thayer Headings, MD  oxyCODONE-acetaminophen (PERCOCET/ROXICET) [phone]  MG per tablet Take by mouth every 4 (four) hours as needed for severe pain.    Historical Provider, MD  ritonavir (NORVIR) 100 MG TABS Take 1 tablet (100 mg total) by mouth daily. 02/09/13   Thayer Headings, MD  Thiamine HCl (VITAMIN B-1) 250 MG tablet Take 250 mg by mouth daily.    Historical Provider, MD   BP 192/121  Pulse 100  Temp(Src) 98.9 F (37.2 C) (Oral)  Resp 24  Ht 5\' 9"  (1.753 m)  Wt 165 lb (74.844 kg)  BMI 24.36 kg/m2  SpO2 98% Physical Exam  Nursing note and vitals reviewed. Constitutional: She is oriented to person, place, and time. She appears  well-developed and well-nourished. She appears distressed (mildly).  HENT:  Head: Normocephalic and atraumatic.  Mouth/Throat: Oropharynx is clear and moist.  Eyes: Conjunctivae and EOM are normal. Pupils are equal, round, and reactive to light. No scleral icterus.  Neck: Normal range of motion. Neck supple. No tracheal deviation present.  Cardiovascular: Regular rhythm, normal heart sounds and intact distal pulses.  Tachycardia present.   No extremity edema or tenderness.  Pulmonary/Chest: Effort normal and breath sounds normal. No respiratory distress. She has no wheezes. She has no rales.  Abdominal: Soft. Normal appearance and bowel sounds are normal. She exhibits no distension. There is generalized tenderness. There is CVA tenderness (bilateral). There is no rigidity, no rebound and no guarding.  No peritoneal signs.  Musculoskeletal: Normal range of motion. She exhibits no edema.  Generalized tenderness across lower and mid-back.  Neurological: She is alert and oriented to person, place, and time.  Skin: Skin is warm and dry. She is not diaphoretic.  Psychiatric: She has a normal mood and affect. Her behavior is normal.    ED Course  Procedures (including critical care time) Labs Review Labs Reviewed  CBC WITH DIFFERENTIAL - Abnormal; Notable for the following:    RBC 3.47 (*)    Hemoglobin 11.4 (*)    HCT 34.4 (*)    Platelets 122 (*)    All other components within normal limits  COMPREHENSIVE METABOLIC PANEL - Abnormal; Notable for the following:    Potassium 3.4 (*)    Total Protein 8.5 (*)    Albumin 3.4 (*)    AST 94 (*)    ALT 72 (*)    GFR calc non Af Amer 66 (*)    GFR calc Af Amer 76 (*)    All other components within normal limits  LIPASE, BLOOD - Abnormal; Notable for the following:    Lipase 64 (*)    All other components within normal limits  URINALYSIS, ROUTINE W REFLEX MICROSCOPIC - Abnormal; Notable for the following:    APPearance CLOUDY (*)     Specific Gravity, Urine 1.045 (*)    Leukocytes, UA SMALL (*)    All other components within normal limits  URINE MICROSCOPIC-ADD ON - Abnormal; Notable for the following:    Squamous Epithelial / LPF MANY (*)    Bacteria, UA MANY (*)    Crystals CA OXALATE CRYSTALS (*)    All other components within normal limits  I-STAT TROPOININ, ED  POC URINE PREG, ED    Imaging Review Dg Chest 2 View  11/28/2013   CLINICAL DATA:  Shortness of breath  EXAM: CHEST  2 VIEW  COMPARISON:  May 25, 2012  FINDINGS: The heart size and mediastinal contours are within normal limits. There is no focal infiltrate, pulmonary edema, or pleural effusion. The visualized  skeletal structures are stable.  IMPRESSION: No active cardiopulmonary disease.   Electronically Signed   By: Abelardo Diesel M.D.   On: 11/28/2013 21:51   Dg Lumbar Spine Complete  11/28/2013   CLINICAL DATA:  Low back pain.  EXAM: LUMBAR SPINE - COMPLETE 4+ VIEW  COMPARISON:  Abdomen and pelvis CT dated 12/20/2008.  FINDINGS: Five non-rib-bearing lumbar vertebrae. These have normal appearances. No pars defects or subluxations. Cholecystectomy clips. Mild atheromatous arterial calcifications.  IMPRESSION: Normal lumbar spine.   Electronically Signed   By: Enrique Sack M.D.   On: 11/28/2013 22:14   Ct Angio Chest Pe W/cm &/or Wo Cm  11/28/2013   CLINICAL DATA:  Low back pain with shortness of breath and nausea and cough  EXAM: CT ANGIOGRAPHY CHEST WITH CONTRAST  TECHNIQUE: Multidetector CT imaging of the chest was performed using the standard protocol during bolus administration of intravenous contrast. Multiplanar CT image reconstructions and MIPs were obtained to evaluate the vascular anatomy.  CONTRAST:  14mL OMNIPAQUE IOHEXOL 350 MG/ML SOLN  COMPARISON:  DG CHEST 2 VIEW dated 11/28/2013  FINDINGS: Contrast within the pulmonary arterial tree is normal in appearance. There are no filling defects to suggest an acute pulmonary embolism. The caliber of the  thoracic aorta is normal. The contrast bolus timing is less than optimal for evaluation of the aorta for dissection. No definite false lumen is demonstrated. There is no periaortic hematoma. The cardiac chambers are normal in size. There is a small hiatal hernia. There are no pathologic sized mediastinal or hilar lymph nodes. The retrosternal soft tissues appear normal. There is no pleural nor pericardial effusion.  At lung window settings there is no interstitial or alveolar infiltrate. No pulmonary parenchymal nodules or masses are demonstrated.  Within the upper abdomen the observed portions of the liver and spleen appear normal. The gallbladder is surgically absent.  The lumbar vertebral bodies are preserved in height. The sternum appears normal. The observed portions of the ribs exhibit no acute abnormalities.  Review of the MIP images confirms the above findings.  IMPRESSION: 1. There is no evidence of an acute pulmonary embolism nor of an acute thoracic aortic abnormality. 2. There is no evidence of CHF nor of pneumonia. 3. There is no mediastinal or hilar lymphadenopathy nor pleural nor pericardial effusion.   Electronically Signed   By: David  Martinique   On: 11/28/2013 22:38     EKG Interpretation None      MDM   Final diagnoses:  Nausea and vomiting  Abdominal pain  Chest pain   Pt presenting with back pain, sob, nausea and vomiting. She appears mildly distressed. Hypertensive. Tachy ~110. Concern for PE. CT angio pending. Regarding back pain, pt afebrile, no hx of IV drug abuse, no signs of cauda equina. Ambulates without difficulty. Xray pending. Labs pending. 9:10 PM After receiving pain meds, pt resting comfortably on exam bed. States she still feels slightly short of breath. 12:59 AM Labs showing slightly elevated liver functions, elevated in the past. Lipase 64. CT angiogram negative for pulmonary embolism. Patient is now complaining of worsening abdominal pain. She has generalized  tenderness, worse in midepigastric area. She is not tolerating by mouth, states she vomits any time she tries to eat or drink. When looking in the emesis bag, there is a wet piece of gauze, no vomit. Will obtain CT with contrast. Patient signed out to Junius Creamer at shift change who will take over care of patient at this time. Phenergan ordered.  Illene Labrador, PA-C 11/29/13 0100

## 2013-11-28 NOTE — ED Notes (Signed)
Patient asked for and received a Ginger Ale.

## 2013-11-29 ENCOUNTER — Encounter (HOSPITAL_COMMUNITY): Payer: Self-pay | Admitting: *Deleted

## 2013-11-29 ENCOUNTER — Emergency Department (HOSPITAL_COMMUNITY): Payer: Self-pay

## 2013-11-29 MED ORDER — SODIUM CHLORIDE 0.9 % IV BOLUS (SEPSIS)
1000.0000 mL | Freq: Once | INTRAVENOUS | Status: AC
  Administered 2013-11-29: 1000 mL via INTRAVENOUS

## 2013-11-29 MED ORDER — PROMETHAZINE HCL 25 MG PO TABS: 25.0000 mg | ORAL_TABLET | Freq: Four times a day (QID) | ORAL | Status: DC | PRN

## 2013-11-29 MED ORDER — IOHEXOL 300 MG/ML  SOLN
25.0000 mL | Freq: Once | INTRAMUSCULAR | Status: AC | PRN
  Administered 2013-11-29: 25 mL via ORAL

## 2013-11-29 MED ORDER — ONDANSETRON HCL 4 MG/2ML IJ SOLN
4.0000 mg | Freq: Once | INTRAMUSCULAR | Status: AC
  Administered 2013-11-29: 4 mg via INTRAVENOUS
  Filled 2013-11-29: qty 2

## 2013-11-29 MED ORDER — IOHEXOL 300 MG/ML  SOLN
75.0000 mL | Freq: Once | INTRAMUSCULAR | Status: AC | PRN
  Administered 2013-11-29: 75 mL via INTRAVENOUS

## 2013-11-29 MED ORDER — PROMETHAZINE HCL 25 MG/ML IJ SOLN
25.0000 mg | Freq: Once | INTRAMUSCULAR | Status: AC
  Administered 2013-11-29: 25 mg via INTRAVENOUS
  Filled 2013-11-29: qty 1

## 2013-11-29 NOTE — ED Notes (Signed)
Pt reporting she is nauseous, pt dry heaving

## 2013-11-29 NOTE — Discharge Instructions (Signed)
Abdominal Pain, Adult °Many things can cause abdominal pain. Usually, abdominal pain is not caused by a disease and will improve without treatment. It can often be observed and treated at home. Your health care provider will do a physical exam and possibly order blood tests and X-rays to help determine the seriousness of your pain. However, in many cases, more time must pass before a clear cause of the pain can be found. Before that point, your health care provider may not know if you need more testing or further treatment. °HOME CARE INSTRUCTIONS  °Monitor your abdominal pain for any changes. The following actions may help to alleviate any discomfort you are experiencing: °· Only take over-the-counter or prescription medicines as directed by your health care provider. °· Do not take laxatives unless directed to do so by your health care provider. °· Try a clear liquid diet (broth, tea, or water) as directed by your health care provider. Slowly move to a bland diet as tolerated. °SEEK MEDICAL CARE IF: °· You have unexplained abdominal pain. °· You have abdominal pain associated with nausea or diarrhea. °· You have pain when you urinate or have a bowel movement. °· You experience abdominal pain that wakes you in the night. °· You have abdominal pain that is worsened or improved by eating food. °· You have abdominal pain that is worsened with eating fatty foods. °SEEK IMMEDIATE MEDICAL CARE IF:  °· Your pain does not go away within 2 hours. °· You have a fever. °· You keep throwing up (vomiting). °· Your pain is felt only in portions of the abdomen, such as the right side or the left lower portion of the abdomen. °· You pass bloody or black tarry stools. °MAKE SURE YOU: °· Understand these instructions.   °· Will watch your condition.   °· Will get help right away if you are not doing well or get worse.   °Document Released: 04/11/2005 Document Revised: 04/22/2013 Document Reviewed: 03/11/2013 °[employer]® Patient  Information ©2014 [employer]. ° °

## 2013-11-29 NOTE — ED Provider Notes (Signed)
CT Scan of abdomen normal Will DC home with Rx for Phenergan   Garald Balding, NP 11/29/13 [phone]

## 2013-11-29 NOTE — ED Notes (Signed)
Pt wheeled out of ED via wheelchair by Monia Sabal, nurse tech. Pt A&Ox4, no acute distress noted at this time.

## 2013-11-30 NOTE — ED Provider Notes (Signed)
Medical screening examination/treatment/procedure(s) were performed by non-physician practitioner and as supervising physician I was immediately available for consultation/collaboration.   EKG Interpretation None       Cellie Dardis R. Sanii Kukla, MD 11/30/13 0005 

## 2013-12-03 NOTE — ED Provider Notes (Signed)
Medical screening examination/treatment/procedure(s) were performed by non-physician practitioner and as supervising physician I was immediately available for consultation/collaboration.  EKG: NSR, no acute ischemic changes, normal intervals, normal axis, normal ST T segments.     Elyn Peers, MD 12/03/13 [phone]

## 2013-12-10 ENCOUNTER — Encounter: Payer: Self-pay | Admitting: Internal Medicine

## 2013-12-10 ENCOUNTER — Ambulatory Visit (INDEPENDENT_AMBULATORY_CARE_PROVIDER_SITE_OTHER): Payer: Self-pay | Admitting: Internal Medicine

## 2013-12-10 VITALS — BP 153/100 | HR 98 | Temp 98.2°F | Ht 69.0 in | Wt 161.0 lb

## 2013-12-10 DIAGNOSIS — M545 Low back pain, unspecified: Secondary | ICD-10-CM

## 2013-12-10 DIAGNOSIS — Z113 Encounter for screening for infections with a predominantly sexual mode of transmission: Secondary | ICD-10-CM

## 2013-12-10 DIAGNOSIS — B20 Human immunodeficiency virus [HIV] disease: Secondary | ICD-10-CM

## 2013-12-10 DIAGNOSIS — Z79899 Other long term (current) drug therapy: Secondary | ICD-10-CM

## 2013-12-10 DIAGNOSIS — F1910 Other psychoactive substance abuse, uncomplicated: Secondary | ICD-10-CM

## 2013-12-10 MED ORDER — EMTRICITABINE-TENOFOVIR DF 200-300 MG PO TABS: 1.0000 | ORAL_TABLET | Freq: Every day | ORAL | Status: DC

## 2013-12-10 MED ORDER — DARUNAVIR ETHANOLATE 800 MG PO TABS: 800.0000 mg | ORAL_TABLET | Freq: Every day | ORAL | Status: DC

## 2013-12-10 MED ORDER — RITONAVIR 100 MG PO TABS: 100.0000 mg | ORAL_TABLET | Freq: Every day | ORAL | Status: DC

## 2013-12-10 NOTE — Progress Notes (Signed)
  Subjective:    Patient ID: Cheryl Thompson, female    DOB: July 25, 1965, 48 y.o.   MRN: [id number]  HPI  Comes in here for followup of her HIV. After a long history of poor compliance related to drug and alcohol abuse she had restarted Prezista, Norvir and Truvada last year.  Her viral load is now undetectable with a CD4 of 940.  She continues to struggle mainly with alcohol and appears drunk today, smells of alcohol.     Additionally, she has had problems with left leg weakness and nerve and back pain that has been ongoing for many months. She has difficulty walking.  She is planning to move away from her mother where she has been living.  Does not feel she needs any substance abuse counseling at this time.    Review of Systems  Constitutional: Positive for activity change. Negative for fatigue.  HENT: Negative for sore throat and trouble swallowing.   Eyes: Negative for visual disturbance.  Respiratory: Negative for cough and shortness of breath.   Cardiovascular: Negative for chest pain and leg swelling.  Gastrointestinal: Negative for nausea, abdominal pain and diarrhea.  Musculoskeletal: Positive for back pain and gait problem. Negative for myalgias.  Skin: Negative for rash.  Neurological: Positive for weakness. Negative for dizziness, tremors, seizures, facial asymmetry, light-headedness, numbness and headaches.  Hematological: Negative for adenopathy.  Psychiatric/Behavioral: Positive for dysphoric mood. Negative for suicidal ideas. The patient is not nervous/anxious.        Objective:   Physical Exam  Constitutional: She is oriented to person, place, and time. She appears well-developed and well-nourished. No distress.  HENT:  Mouth/Throat: No oropharyngeal exudate.  Eyes: Right eye exhibits no discharge. Left eye exhibits no discharge. No scleral icterus.  Cardiovascular: Normal rate, regular rhythm and normal heart sounds.   No murmur heard. Pulmonary/Chest: Effort normal and  breath sounds normal. No respiratory distress. She has no wheezes.  Musculoskeletal: She exhibits no edema.  She does have weakness of her left leg  Lymphadenopathy:    She has no cervical adenopathy.  Neurological: She is alert and oriented to person, place, and time.  Skin: Skin is warm and dry. No rash noted. No erythema.  Psychiatric: She has a normal mood and affect. Her behavior is normal.          Assessment & Plan:

## 2013-12-10 NOTE — Assessment & Plan Note (Signed)
Doing great, undetectable.  RTC 3 months.

## 2013-12-10 NOTE — Assessment & Plan Note (Signed)
She is going to establish with a primary physician for further management.

## 2013-12-10 NOTE — Assessment & Plan Note (Signed)
Offered help with our substance abuse services.  Not interested at this time.

## 2013-12-14 ENCOUNTER — Other Ambulatory Visit: Payer: Self-pay | Admitting: *Deleted

## 2013-12-14 DIAGNOSIS — B20 Human immunodeficiency virus [HIV] disease: Secondary | ICD-10-CM

## 2013-12-14 MED ORDER — EMTRICITABINE-TENOFOVIR DF 200-300 MG PO TABS: 1.0000 | ORAL_TABLET | Freq: Every day | ORAL | Status: DC

## 2013-12-14 MED ORDER — DARUNAVIR ETHANOLATE 800 MG PO TABS: 800.0000 mg | ORAL_TABLET | Freq: Every day | ORAL | Status: DC

## 2013-12-14 MED ORDER — RITONAVIR 100 MG PO TABS: 100.0000 mg | ORAL_TABLET | Freq: Every day | ORAL | Status: DC

## 2013-12-14 NOTE — Telephone Encounter (Signed)
[employer] application 

## 2013-12-26 ENCOUNTER — Emergency Department (HOSPITAL_COMMUNITY)
Admission: EM | Admit: 2013-12-26 | Discharge: 2013-12-26 | Disposition: A | Discharge status: Home / Self Care | Payer: Self-pay | Attending: Emergency Medicine | Admitting: Emergency Medicine

## 2013-12-26 ENCOUNTER — Encounter (HOSPITAL_COMMUNITY): Payer: Self-pay | Admitting: Emergency Medicine

## 2013-12-26 ENCOUNTER — Emergency Department (HOSPITAL_COMMUNITY): Payer: Self-pay

## 2013-12-26 DIAGNOSIS — Y929 Unspecified place or not applicable: Secondary | ICD-10-CM | POA: Insufficient documentation

## 2013-12-26 DIAGNOSIS — Z8619 Personal history of other infectious and parasitic diseases: Secondary | ICD-10-CM | POA: Insufficient documentation

## 2013-12-26 DIAGNOSIS — Z791 Long term (current) use of non-steroidal anti-inflammatories (NSAID): Secondary | ICD-10-CM | POA: Insufficient documentation

## 2013-12-26 DIAGNOSIS — M1990 Unspecified osteoarthritis, unspecified site: Secondary | ICD-10-CM

## 2013-12-26 DIAGNOSIS — S99929A Unspecified injury of unspecified foot, initial encounter: Principal | ICD-10-CM

## 2013-12-26 DIAGNOSIS — Y93E2 Activity, laundry: Secondary | ICD-10-CM | POA: Insufficient documentation

## 2013-12-26 DIAGNOSIS — Z21 Asymptomatic human immunodeficiency virus [HIV] infection status: Secondary | ICD-10-CM | POA: Insufficient documentation

## 2013-12-26 DIAGNOSIS — Z880 Allergy status to penicillin: Secondary | ICD-10-CM | POA: Insufficient documentation

## 2013-12-26 DIAGNOSIS — X500XXA Overexertion from strenuous movement or load, initial encounter: Secondary | ICD-10-CM | POA: Insufficient documentation

## 2013-12-26 DIAGNOSIS — Z8659 Personal history of other mental and behavioral disorders: Secondary | ICD-10-CM | POA: Insufficient documentation

## 2013-12-26 DIAGNOSIS — G40909 Epilepsy, unspecified, not intractable, without status epilepticus: Secondary | ICD-10-CM | POA: Insufficient documentation

## 2013-12-26 DIAGNOSIS — I10 Essential (primary) hypertension: Secondary | ICD-10-CM

## 2013-12-26 DIAGNOSIS — F17200 Nicotine dependence, unspecified, uncomplicated: Secondary | ICD-10-CM | POA: Insufficient documentation

## 2013-12-26 DIAGNOSIS — M25469 Effusion, unspecified knee: Secondary | ICD-10-CM | POA: Insufficient documentation

## 2013-12-26 DIAGNOSIS — S8990XA Unspecified injury of unspecified lower leg, initial encounter: Secondary | ICD-10-CM | POA: Insufficient documentation

## 2013-12-26 DIAGNOSIS — M25462 Effusion, left knee: Secondary | ICD-10-CM

## 2013-12-26 DIAGNOSIS — Z79899 Other long term (current) drug therapy: Secondary | ICD-10-CM | POA: Insufficient documentation

## 2013-12-26 DIAGNOSIS — S99919A Unspecified injury of unspecified ankle, initial encounter: Principal | ICD-10-CM

## 2013-12-26 MED ORDER — HYDROCHLOROTHIAZIDE 25 MG PO TABS: 25.0000 mg | ORAL_TABLET | Freq: Every day | ORAL | Status: DC

## 2013-12-26 MED ORDER — DIPHENHYDRAMINE HCL 25 MG PO CAPS
25.0000 mg | ORAL_CAPSULE | Freq: Once | ORAL | Status: AC
  Administered 2013-12-26: 25 mg via ORAL
  Filled 2013-12-26: qty 1

## 2013-12-26 MED ORDER — MORPHINE SULFATE 4 MG/ML IJ SOLN
4.0000 mg | Freq: Once | INTRAMUSCULAR | Status: AC
  Administered 2013-12-26: 4 mg via INTRAMUSCULAR
  Filled 2013-12-26: qty 1

## 2013-12-26 MED ORDER — OXYCODONE-ACETAMINOPHEN 5-325 MG PO TABS
2.0000 | ORAL_TABLET | Freq: Once | ORAL | Status: AC
  Administered 2013-12-26: 2 via ORAL
  Filled 2013-12-26: qty 2

## 2013-12-26 MED ORDER — OXYCODONE-ACETAMINOPHEN 5-325 MG PO TABS: 1.0000 | ORAL_TABLET | Freq: Four times a day (QID) | ORAL | Status: DC | PRN

## 2013-12-26 MED ORDER — HYDROCHLOROTHIAZIDE 25 MG PO TABS
25.0000 mg | ORAL_TABLET | Freq: Every day | ORAL | Status: DC
  Administered 2013-12-26: 25 mg via ORAL
  Filled 2013-12-26: qty 1

## 2013-12-26 NOTE — ED Notes (Signed)
MD notified of PT's high BP and he wants to keep her, give HCTZ and see if the BP normalizes

## 2013-12-26 NOTE — ED Notes (Signed)
MD at bedside. Cheryl Thompson

## 2013-12-26 NOTE — ED Notes (Signed)
Per EMS complaining of L knee pain. Denies trauma/fall/insult. PT has hx of this and needed to have fluid drained in the past (a few years ago). Started early this morning. PT took Midol today to try and reduce pain. VS stable.

## 2013-12-26 NOTE — ED Notes (Signed)
PT mildly diaphoretic and tearful from pain.

## 2013-12-26 NOTE — ED Notes (Signed)
PT refused wheelchair-wanted to practice on her crutches

## 2013-12-26 NOTE — Discharge Instructions (Signed)
Try to stay off your left leg.   Keep leg wrapped for comfort and swelling.   Use crutches.   You should see orthopedic doctor for further evaluation.   Your blood pressure is elevated. Take HCTZ as prescribed. Follow up with your doctor.   Return to ER if you have worse swelling, severe pain, unable to walk.

## 2013-12-26 NOTE — ED Provider Notes (Addendum)
CSN: [id number]     Arrival date & time 12/26/13  1637 History   First MD Initiated Contact with Patient 12/26/13 1645     Chief Complaint  Patient presents with  . Knee Pain     (Consider location/radiation/quality/duration/timing/severity/associated sxs/prior Treatment) The history is provided by the patient.  [name] is a 48 y.o. female hx of HIV (CD 4 nl, viral load undetectable last month), HTN, depression here with L knee pain. L knee pain acute onset this morning. She was doing her laundry and suddenly heard a pop L knee and then it swelled up. Denies fall or injury. Had knee effusion in the past and had arthrocentesis before. Denies fevers or previous joint infections.    Past Medical History  Diagnosis Date  . HIV (human immunodeficiency virus infection)   . Hypertension   . Depression   . Seizures   . Acute alcoholic hepatitis   . Hepatitis C    Past Surgical History  Procedure Laterality Date  . Total abdominal hysterectomy w/ bilateral salpingoophorectomy  2006  . 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 Abdominal hysterectomy     Family History  Problem Relation Age of Onset  . Stroke Mother   . Stroke Father    History  Substance Use Topics  . Smoking status: Current Every Day Smoker -- 0.10 packs/day    Types: Cigarettes  . Smokeless tobacco: Never Used  . Alcohol Use: 0.0 oz/week     Comment: Drinks a pint of hard alcohol at a time   OB History   Grav Para Term Preterm Abortions TAB SAB Ect Mult Living                 Review of Systems  Musculoskeletal:       L knee pain   All other systems reviewed and are negative.     Allergies  Penicillins and Naproxen  Home Medications   Prior to Admission medications   Medication Sig Start Date End Date Taking? Authorizing Provider  Aspirin-Acetaminophen (GOODYS BODY PAIN PO) Take 1 tablet by mouth daily as needed (pain).   Yes Historical Provider, MD   cholecalciferol (VITAMIN D) 1000 UNITS tablet Take 1,000 Units by mouth every morning.    Yes Historical Provider, MD  Darunavir Ethanolate (PREZISTA) 800 MG tablet Take 1 tablet (800 mg total) by mouth daily. 12/14/13  Yes Thayer Headings, MD  divalproex (DEPAKOTE ER) 250 MG 24 hr tablet Take 250 mg by mouth 3 (three) times daily. 10/29/12  Yes Vivi Ferns, MD  emtricitabine-tenofovir (TRUVADA) 200-300 MG per tablet Take 1 tablet by mouth daily. 12/14/13  Yes Thayer Headings, MD  Ibuprofen (MIDOL) 200 MG CAPS Take 400 mg by mouth daily as needed (for knee pain).   Yes Historical Provider, MD  promethazine (PHENERGAN) 25 MG tablet Take 1 tablet (25 mg total) by mouth every 6 (six) hours as needed for nausea. 11/29/13  Yes Garald Balding, NP  ritonavir (NORVIR) 100 MG TABS tablet Take 1 tablet (100 mg total) by mouth daily. 12/14/13  Yes Thayer Headings, MD  Thiamine HCl (VITAMIN B-1) 250 MG tablet Take 250 mg by mouth daily.   Yes Historical Provider, MD   BP 160/82  Pulse 70  Resp 16  SpO2 100% Physical Exam  Nursing note and vitals reviewed. Constitutional: She is oriented to person, place, and time.  Slightly uncomfortable  HENT:  Head: Normocephalic.  Eyes: Conjunctivae are normal. Pupils are equal, round, and reactive to light.  Neck: Normal range of motion.  Cardiovascular: Normal rate.   Pulmonary/Chest: Effort normal.  Abdominal: Soft.  Musculoskeletal:  L knee swollen dec ROM from pain and swelling. ? Laxity in ACL. No femur or hip or tib/fib tenderness. 2+ pulses   Neurological: She is alert and oriented to person, place, and time. No cranial nerve deficit. Coordination normal.  Skin: Skin is warm and dry.  Psychiatric: She has a normal mood and affect. Her behavior is normal. Judgment and thought content normal.    ED Course  Procedures (including critical care time) Labs Review Labs Reviewed - No data to display  Imaging Review Dg Knee Complete 4 Views Left  12/26/2013    CLINICAL DATA:  Left knee pain and swelling.  Unable to bear weight.  EXAM: LEFT KNEE - COMPLETE 4+ VIEW  COMPARISON:  Left knee radiographs 07/09/2012.  FINDINGS: Mild osteopenia is present. The knee is located. Tricompartmental degenerative changes are similar to the prior study. A small joint effusion is present.  IMPRESSION: 1. Small joint effusion without evidence for acute osseous abnormality. Internal derangement is not excluded. 2. Similar appearance of tricompartmental degenerative change.   Electronically Signed   By: Lawrence Santiago M.D.   On: 12/26/2013 18:38     EKG Interpretation None      MDM   Final diagnoses:  None    Cheryl Thompson is a 48 y.o. female here with L knee swelling. I think likely has torn ligament. Will get xrays. Will likely give pain meds and ortho referral.   7:08 PM Xray showed severe arthritis. I think likely torn ligament. Counseled her regarding risks and benefits for arthrocentesis and she decided to get ACE wrap, knee immobilizer, crutches. I recommend ortho f/u for further evaluation. I don't think she has septic knee.   9:06 PM Around time of dc her BP went up to 174/108 and doesn't appear in pain. Hasn't been on any BP meds. Given HCTZ, BP improved to 169/90. Will d/c home with same. No signs of hypertensive emergency.   Wandra Arthurs, MD 12/26/13 1951  Wandra Arthurs, MD 12/26/13 2108

## 2014-03-02 ENCOUNTER — Other Ambulatory Visit: Payer: Self-pay

## 2014-03-05 ENCOUNTER — Other Ambulatory Visit: Payer: Self-pay

## 2014-03-16 ENCOUNTER — Ambulatory Visit (INDEPENDENT_AMBULATORY_CARE_PROVIDER_SITE_OTHER): Payer: Self-pay | Admitting: Internal Medicine

## 2014-03-16 ENCOUNTER — Encounter: Payer: Self-pay | Admitting: Internal Medicine

## 2014-03-16 ENCOUNTER — Other Ambulatory Visit: Payer: Self-pay | Admitting: *Deleted

## 2014-03-16 ENCOUNTER — Other Ambulatory Visit: Payer: Self-pay | Admitting: Internal Medicine

## 2014-03-16 VITALS — BP 161/108 | HR 69 | Temp 98.5°F | Wt 155.0 lb

## 2014-03-16 DIAGNOSIS — Z79899 Other long term (current) drug therapy: Secondary | ICD-10-CM

## 2014-03-16 DIAGNOSIS — Z23 Encounter for immunization: Secondary | ICD-10-CM

## 2014-03-16 DIAGNOSIS — B20 Human immunodeficiency virus [HIV] disease: Secondary | ICD-10-CM

## 2014-03-16 DIAGNOSIS — Z113 Encounter for screening for infections with a predominantly sexual mode of transmission: Secondary | ICD-10-CM

## 2014-03-16 DIAGNOSIS — I10 Essential (primary) hypertension: Secondary | ICD-10-CM

## 2014-03-16 LAB — CBC WITH DIFFERENTIAL/PLATELET
Basophils Absolute: 0 10*3/uL (ref 0.0–0.1)
Basophils Relative: 0 % (ref 0–1)
Eosinophils Absolute: 0.1 10*3/uL (ref 0.0–0.7)
Eosinophils Relative: 1 % (ref 0–5)
HCT: 33.6 % — ABNORMAL LOW (ref 36.0–46.0)
Hemoglobin: 11.2 g/dL — ABNORMAL LOW (ref 12.0–15.0)
Lymphocytes Relative: 40 % (ref 12–46)
Lymphs Abs: 2.5 10*3/uL (ref 0.7–4.0)
MCH: 32.9 pg (ref 26.0–34.0)
MCHC: 33.3 g/dL (ref 30.0–36.0)
MCV: 98.8 fL (ref 78.0–100.0)
Monocytes Absolute: 0.6 10*3/uL (ref 0.1–1.0)
Monocytes Relative: 10 % (ref 3–12)
Neutro Abs: 3 10*3/uL (ref 1.7–7.7)
Neutrophils Relative %: 49 % (ref 43–77)
Platelets: 144 10*3/uL — ABNORMAL LOW (ref 150–400)
RBC: 3.4 MIL/uL — ABNORMAL LOW (ref 3.87–5.11)
RDW: 14 % (ref 11.5–15.5)
WBC: 6.2 10*3/uL (ref 4.0–10.5)

## 2014-03-16 LAB — COMPLETE METABOLIC PANEL WITH GFR
ALT: 46 U/L — ABNORMAL HIGH (ref 0–35)
AST: 72 U/L — ABNORMAL HIGH (ref 0–37)
Albumin: 3.6 g/dL (ref 3.5–5.2)
Alkaline Phosphatase: 72 U/L (ref 39–117)
BUN: 12 mg/dL (ref 6–23)
CO2: 28 mEq/L (ref 19–32)
Calcium: 8.5 mg/dL (ref 8.4–10.5)
Chloride: 103 mEq/L (ref 96–112)
Creat: 1.04 mg/dL (ref 0.50–1.10)
GFR, Est African American: 73 mL/min
GFR, Est Non African American: 64 mL/min
Glucose, Bld: 69 mg/dL — ABNORMAL LOW (ref 70–99)
Potassium: 3.8 mEq/L (ref 3.5–5.3)
Sodium: 138 mEq/L (ref 135–145)
Total Bilirubin: 0.4 mg/dL (ref 0.2–1.2)
Total Protein: 7.6 g/dL (ref 6.0–8.3)

## 2014-03-16 LAB — LIPID PANEL
Cholesterol: 131 mg/dL (ref 0–200)
HDL: 27 mg/dL — ABNORMAL LOW (ref >39)
Total CHOL/HDL Ratio: 4.9 Ratio
Triglycerides: 527 mg/dL — ABNORMAL HIGH (ref <150)

## 2014-03-16 LAB — RPR

## 2014-03-16 MED ORDER — HYDROCHLOROTHIAZIDE 25 MG PO TABS: 25.0000 mg | ORAL_TABLET | Freq: Every day | ORAL | Status: DC

## 2014-03-16 NOTE — Assessment & Plan Note (Signed)
Very high.  Will start her back on HCTZ until she gets in to new PCP.

## 2014-03-16 NOTE — Progress Notes (Signed)
  Subjective:    Patient ID: Cheryl Thompson, female    DOB: Mar 01, 1966, 48 y.o.   MRN: [id number]  HPI Comes in here for followup of her HIV. After a long history of poor compliance related to drug and alcohol abuse she had restarted Prezista, Norvir and Truvada last year. No labs prior to appt.     Additionally, she continues to complain of pain in knees, back and left shoulder.  Has not established with local PCP.  Was in [place] before.     Review of Systems  Constitutional: Negative for fatigue.  HENT: Negative for sore throat and trouble swallowing.   Respiratory: Negative for cough and shortness of breath.   Cardiovascular: Negative for chest pain and leg swelling.  Gastrointestinal: Negative for nausea and diarrhea.  Musculoskeletal: Positive for back pain and gait problem. Negative for myalgias.  Skin: Negative for rash.  Neurological: Positive for weakness. Negative for dizziness, light-headedness and headaches.  Hematological: Negative for adenopathy.  Psychiatric/Behavioral: Negative for sleep disturbance.       Objective:   Physical Exam  Constitutional: She appears well-developed and well-nourished. No distress.  HENT:  Mouth/Throat: No oropharyngeal exudate.  Eyes: Right eye exhibits no discharge. Left eye exhibits no discharge. No scleral icterus.  Cardiovascular: Normal rate, regular rhythm and normal heart sounds.   No murmur heard. Pulmonary/Chest: Effort normal and breath sounds normal. No respiratory distress. She has no wheezes.  Musculoskeletal:  She does have weakness of her left leg  Lymphadenopathy:    She has no cervical adenopathy.  Skin: Skin is warm and dry. No rash noted. No erythema.          Assessment & Plan:

## 2014-03-16 NOTE — Assessment & Plan Note (Signed)
Reports she is doing well. Labs today and RTC 4 months if reassuring.

## 2014-03-17 LAB — T-HELPER CELL (CD4) - (RCID CLINIC ONLY)
CD4 % Helper T Cell: 47 % (ref 33–55)
CD4 T Cell Abs: 1070 /uL (ref 400–2700)

## 2014-03-17 LAB — HIV-1 RNA QUANT-NO REFLEX-BLD: HIV 1 RNA Quant: <20 copies/mL (ref <20)

## 2014-03-20 LAB — HLA B*5701: HLA-B*5701 w/rflx HLA-B High: NEGATIVE

## 2014-03-24 ENCOUNTER — Other Ambulatory Visit: Payer: Self-pay | Admitting: *Deleted

## 2014-03-24 DIAGNOSIS — B20 Human immunodeficiency virus [HIV] disease: Secondary | ICD-10-CM

## 2014-03-24 MED ORDER — EMTRICITABINE-TENOFOVIR DF 200-300 MG PO TABS: 1.0000 | ORAL_TABLET | Freq: Every day | ORAL | Status: DC

## 2014-03-24 MED ORDER — DARUNAVIR ETHANOLATE 800 MG PO TABS: 800.0000 mg | ORAL_TABLET | Freq: Every day | ORAL | Status: DC

## 2014-03-24 MED ORDER — RITONAVIR 100 MG PO TABS: 100.0000 mg | ORAL_TABLET | Freq: Every day | ORAL | Status: DC

## 2014-03-24 NOTE — Telephone Encounter (Signed)
ADAP Application 

## 2014-04-05 ENCOUNTER — Ambulatory Visit (INDEPENDENT_AMBULATORY_CARE_PROVIDER_SITE_OTHER): Payer: Self-pay | Admitting: Family Medicine

## 2014-04-05 ENCOUNTER — Encounter: Payer: Self-pay | Admitting: Family Medicine

## 2014-04-05 VITALS — BP 182/104 | HR 67 | Temp 98.2°F | Resp 16 | Ht 69.0 in | Wt 157.0 lb

## 2014-04-05 DIAGNOSIS — Z1231 Encounter for screening mammogram for malignant neoplasm of breast: Secondary | ICD-10-CM

## 2014-04-05 DIAGNOSIS — F17200 Nicotine dependence, unspecified, uncomplicated: Secondary | ICD-10-CM

## 2014-04-05 DIAGNOSIS — Z8669 Personal history of other diseases of the nervous system and sense organs: Secondary | ICD-10-CM

## 2014-04-05 DIAGNOSIS — I10 Essential (primary) hypertension: Secondary | ICD-10-CM

## 2014-04-05 DIAGNOSIS — B20 Human immunodeficiency virus [HIV] disease: Secondary | ICD-10-CM

## 2014-04-05 DIAGNOSIS — E781 Pure hyperglyceridemia: Secondary | ICD-10-CM

## 2014-04-05 DIAGNOSIS — F1921 Other psychoactive substance dependence, in remission: Secondary | ICD-10-CM

## 2014-04-05 DIAGNOSIS — M62838 Other muscle spasm: Secondary | ICD-10-CM

## 2014-04-05 DIAGNOSIS — F319 Bipolar disorder, unspecified: Secondary | ICD-10-CM

## 2014-04-05 MED ORDER — AMLODIPINE BESYLATE 5 MG PO TABS: 5.0000 mg | ORAL_TABLET | Freq: Every day | ORAL | Status: DC

## 2014-04-05 MED ORDER — KRILL OIL 1000 MG PO CAPS: 1.0000 | ORAL_CAPSULE | Freq: Every day | ORAL | Status: DC

## 2014-04-05 NOTE — Progress Notes (Signed)
Subjective:    Patient ID: Cheryl Thompson, female    DOB: May 28, 1966, 48 y.o.   MRN: [id number]  HPI Cheryl Thompson is a 48 year old female with  a past medical history of HIV, hypertension, hepatitis C, seizures and depression presents to establish care. Patient states that she was previously  a patient of Cheryl Thompson in [place], [place].   She states that she was followed by Cheryl Thompson at [facility] for Infectious Disease for HIV. She reports that her last visit was on 03/16/2014. She maintains that she has been taking medications consistently for the past few months. She reports that she previously had problems with medication adherence due to alcohol and drug dependence.   Patient has a history of seizure disorder. She states that she had a seizure on 04/02/2014. She states that she had uncontrollable shakes which dissipated quickly. She maintains that the seizure was witnessed by family and she did not seek emergency care. She denies headache, weakness, confusion, chest pains, numbness or tingling to extremities.    Patient here for follow-up of elevated blood pressure. She is not exercising and is not adherent to low salt diet.  Cheryl Thompson does not check her blood pressure at home. . Patient denies chest pain, claudication, dyspnea, fatigue, lower extremity edema, palpitations, syncope and tachypnea.  Cardiovascular risk factors include hypertension and smoking/ tobacco exposure.   Past Medical History  Diagnosis Date  . HIV (human immunodeficiency virus infection)   . Hypertension   . Depression   . Seizures   . Acute alcoholic hepatitis   . Hepatitis C     Review of Systems  Constitutional: Negative.  Negative for fever, activity change, fatigue and unexpected weight change.  HENT: Negative.   Eyes: Negative.   Respiratory: Negative.   Cardiovascular: Negative.  Negative for chest pain, palpitations and leg swelling.  Gastrointestinal: Negative.   Endocrine: Negative.    Genitourinary: Negative.   Musculoskeletal: Positive for myalgias (historty of muscle aches and low back pains).  Skin: Negative.   Allergic/Immunologic: Negative.   Neurological: Positive for seizures. Negative for dizziness and tremors.  Hematological: Negative.   Psychiatric/Behavioral: Negative.        Objective:   Physical Exam  Constitutional: She is oriented to person, place, and time. She appears well-developed and well-nourished.  HENT:  Head: Normocephalic and atraumatic.  Right Ear: Hearing, tympanic membrane, external ear and ear canal normal.  Left Ear: Hearing, tympanic membrane, external ear and ear canal normal.  Nose: Nose normal.  Mouth/Throat: Uvula is midline and oropharynx is clear and moist.  Eyes: Conjunctivae are normal. Pupils are equal, round, and reactive to light.  Neck: Trachea normal and normal range of motion. Neck supple.  Cardiovascular: Normal rate, regular rhythm, S1 normal, S2 normal and normal heart sounds.   Pulmonary/Chest: Effort normal and breath sounds normal.  Abdominal: Soft. Bowel sounds are normal.  Musculoskeletal: Normal range of motion.  Neurological: She is alert and oriented to person, place, and time. She has normal reflexes.  Skin: Skin is warm and dry.  Psychiatric: She has a normal mood and affect. Her behavior is normal. Judgment and thought content normal.         BP 182/104  Pulse 67  Temp(Src) 98.2 F (36.8 C) (Oral)  Resp 16  Ht 5\' 9"  (1.753 m)  Wt 157 lb (71.215 kg)  BMI 23.17 kg/m2 Assessment & Plan:  1. Essential hypertension, benign -In reviewing previous vitals, blood pressure has  been consistently increased. Will increase the current dose of Amlodipine 10 mg and add Lisinopril to medication regimen. Reviewed previous EKG, will repeat to evaluate any current changes. Last EKG, showed a RBBB. Reviewed BP from 03/16/2014. BUN/Creat within a normal range. Patient is currently asymptomatic.   EKG 12-Lead -  amLODipine (NORVASC) 10 MG tablet; Take 1 tablet (10 mg total) by mouth daily.  Dispense: 30 tablet; Refill: 2 - lisinopril (PRINIVIL,ZESTRIL) 10 MG tablet; Take 1 tablet (10 mg total) by mouth daily.  Dispense: 30 tablet; Refill: 0  2. HIV INFECTION Stable on current treatment regimen. Reviewed previous labs. She has an appt scheduled with Cheryl Thompson in December 3. SEIZURES, HX OF She states that she has had seizures periodically throughout her life. She maintains that seizures typically occur with discontinuing recreational drugs. Recommend that she seeks a rehabilitation program for support.   4.  History of drug dependence/abuse She states that she has been in several drug detoxification programs. She reports that she has not used alcohol or crack cocaine in greater than 1 month. Patient is currently not in a treatment program. I strongly recommended ongoing treatment through [facility] program. Patient states that she has been doing well and plans on continuing.   5. Tobacco dependence  Patient is currently smoking 0.5 ppd. She is not interested in smoking cessation at this time. Smoking cessation instruction/counseling given:  counseled patient on the dangers of tobacco use, advised patient to stop smoking, and reviewed strategies to maximize success  6. BIPOLAR AFFECTIVE DISORDER Stable; she is currently not on medications for disorder. She states that she has spoken to counselor periodically at [facility]. Recommend that she schedule a follow up appointment with counselor. Gave information for [facility].   7. Hypertriglyceridemia Reviewed labs, triglycerides are consistently elevated. Also, liver enzymes are elevated. Will start [employer] Daily. Discussed lowfat diet at length. Will re-check lipid panel.    - Krill Oil 1000 MG CAPS; Take 1 capsule (1,000 mg total) by mouth daily.  Dispense: 30 capsule; Refill: 3  8. Other screening mammogram  - MM DIGITAL SCREENING BILATERAL;  Future  Preventative care: Cheryl Thompson will need a yearly Pap smear To schedule a routine eye examination She is up to date with vaccinations   RTC: 1 week for BP check, return in 1 month with Dr. Zigmund Daniel for follow-up of uncontrolled hypertension.    [name], FNP

## 2014-04-05 NOTE — Patient Instructions (Signed)
Hypertension Hypertension, commonly called high blood pressure, is when the force of blood pumping through your arteries is too strong. Your arteries are the blood vessels that carry blood from your heart throughout your body. A blood pressure reading consists of a higher number over a lower number, such as 110/72. The higher number (systolic) is the pressure inside your arteries when your heart pumps. The lower number (diastolic) is the pressure inside your arteries when your heart relaxes. Ideally you want your blood pressure below 120/80. Hypertension forces your heart to work harder to pump blood. Your arteries may become narrow or stiff. Having hypertension puts you at risk for heart disease, stroke, and other problems.  RISK FACTORS Some risk factors for high blood pressure are controllable. Others are not.  Risk factors you cannot control include:   Race. You may be at higher risk if you are African American.  Age. Risk increases with age.  Gender. Men are at higher risk than women before age 45 years. After age 65, women are at higher risk than men. Risk factors you can control include:  Not getting enough exercise or physical activity.  Being overweight.  Getting too much fat, sugar, calories, or salt in your diet.  Drinking too much alcohol. SIGNS AND SYMPTOMS Hypertension does not usually cause signs or symptoms. Extremely high blood pressure (hypertensive crisis) may cause headache, anxiety, shortness of breath, and nosebleed. DIAGNOSIS  To check if you have hypertension, your health care provider will measure your blood pressure while you are seated, with your arm held at the level of your heart. It should be measured at least twice using the same arm. Certain conditions can cause a difference in blood pressure between your right and left arms. A blood pressure reading that is higher than normal on one occasion does not mean that you need treatment. If one blood pressure reading  is high, ask your health care provider about having it checked again. TREATMENT  Treating high blood pressure includes making lifestyle changes and possibly taking medicine. Living a healthy lifestyle can help lower high blood pressure. You may need to change some of your habits. Lifestyle changes may include:  Following the DASH diet. This diet is high in fruits, vegetables, and whole grains. It is low in salt, red meat, and added sugars.  Getting at least 2 hours of brisk physical activity every week.  Losing weight if necessary.  Not smoking.  Limiting alcoholic beverages.  Learning ways to reduce stress. If lifestyle changes are not enough to get your blood pressure under control, your health care provider may prescribe medicine. You may need to take more than one. Work closely with your health care provider to understand the risks and benefits. HOME CARE INSTRUCTIONS  Have your blood pressure rechecked as directed by your health care provider.   Take medicines only as directed by your health care provider. Follow the directions carefully. Blood pressure medicines must be taken as prescribed. The medicine does not work as well when you skip doses. Skipping doses also puts you at risk for problems.   Do not smoke.   Monitor your blood pressure at home as directed by your health care provider. SEEK MEDICAL CARE IF:   You think you are having a reaction to medicines taken.  You have recurrent headaches or feel dizzy.  You have swelling in your ankles.  You have trouble with your vision. SEEK IMMEDIATE MEDICAL CARE IF:  You develop a severe headache or confusion.    You have unusual weakness, numbness, or feel faint.  You have severe chest or abdominal pain.  You vomit repeatedly.  You have trouble breathing. MAKE SURE YOU:   Understand these instructions.  Will watch your condition.  Will get help right away if you are not doing well or get worse. Document  Released: 07/02/2005 Document Revised: 11/16/2013 Document Reviewed: 04/24/2013 ExitCare Patient Information 2015 [employer]. This information is not intended to replace advice given to you by your health care provider. Make sure you discuss any questions you have with your health care provider. High Cholesterol High cholesterol refers to having a high level of cholesterol in your blood. Cholesterol is a white, waxy, fat-like protein that your body needs in small amounts. Your liver makes all the cholesterol you need. Excess cholesterol comes from the food you eat. Cholesterol travels in your bloodstream through your blood vessels. If you have high cholesterol, deposits (plaque) may build up on the walls of your blood vessels. This makes the arteries narrower and stiffer. Plaque increases your risk of heart attack and stroke. Work with your health care provider to keep your cholesterol levels in a healthy range. RISK FACTORS Several things can make you more likely to have high cholesterol. These include:   Eating foods high in animal fat (saturated fat) or cholesterol.  Being overweight.  Not getting enough exercise.  Having a family history of high cholesterol. SIGNS AND SYMPTOMS High cholesterol does not cause symptoms. DIAGNOSIS  Your health care provider can do a blood test to check whether you have high cholesterol. If you are older than 20, your health care provider may check your cholesterol every 4-6 years. You may be checked more often if you already have high cholesterol or other risk factors for heart disease. The blood test for cholesterol measures the following:  Bad cholesterol (LDL cholesterol). This is the type of cholesterol that causes heart disease. This number should be less than 100.  Good cholesterol (HDL cholesterol). This type helps protect against heart disease. A healthy level of HDL cholesterol is 60 or higher.  Total cholesterol. This is the combined number of  LDL cholesterol and HDL cholesterol. A healthy number is less than 200. TREATMENT  High cholesterol can be treated with diet changes, lifestyle changes, and medicine.   Diet changes may include eating more whole grains, fruits, vegetables, nuts, and fish. You may also have to cut back on red meat and foods with a lot of added sugar.  Lifestyle changes may include getting at least 40 minutes of aerobic exercise three times a week. Aerobic exercises include walking, biking, and swimming. Aerobic exercise along with a healthy diet can help you maintain a healthy weight. Lifestyle changes may also include quitting smoking.  If diet and lifestyle changes are not enough to lower your cholesterol, your health care provider may prescribe a statin medicine. This medicine has been shown to lower cholesterol and also lower the risk of heart disease. HOME CARE INSTRUCTIONS  Only take over-the-counter or prescription medicines as directed by your health care provider.   Follow a healthy diet as directed by your health care provider. For instance:   Eat chicken (without skin), fish, veal, shellfish, ground turkey breast, and round or loin cuts of red meat.  Do not eat fried foods and fatty meats, such as hot dogs and salami.   Eat plenty of fruits, such as apples.   Eat plenty of vegetables, such as broccoli, potatoes, and carrots.     Eat beans, peas, and lentils.   Eat grains, such as barley, rice, couscous, and bulgur wheat.   Eat pasta without cream sauces.   Use skim or nonfat milk and low-fat or nonfat yogurt and cheeses. Do not eat or drink whole milk, cream, ice cream, egg yolks, and hard cheeses.   Do not eat stick margarine or tub margarines that contain trans fats (also called partially hydrogenated oils).   Do not eat cakes, cookies, crackers, or other baked goods that contain trans fats.   Do not eat saturated tropical oils, such as coconut and palm oil.   Exercise as  directed by your health care provider. Increase your activity level with activities such as gardening or walking.   Keep all follow-up appointments.  SEEK MEDICAL CARE IF:  You are struggling to maintain a healthy diet or weight.  You need help starting an exercise program.  You need help to stop smoking. SEEK IMMEDIATE MEDICAL CARE IF:  You have chest pain.  You have trouble breathing. Document Released: 07/02/2005 Document Revised: 11/16/2013 Document Reviewed: 04/24/2013 ExitCare Patient Information 2015 [employer]. This information is not intended to replace advice given to you by your health care provider. Make sure you discuss any questions you have with your health care provider.  

## 2014-04-12 ENCOUNTER — Ambulatory Visit: Payer: Self-pay

## 2014-04-12 ENCOUNTER — Encounter: Payer: Self-pay | Admitting: Family Medicine

## 2014-04-12 VITALS — BP 182/88 | HR 68

## 2014-04-12 DIAGNOSIS — I10 Essential (primary) hypertension: Secondary | ICD-10-CM

## 2014-04-12 MED ORDER — AMLODIPINE BESYLATE 10 MG PO TABS: 10.0000 mg | ORAL_TABLET | Freq: Every day | ORAL | Status: DC

## 2014-04-12 MED ORDER — LISINOPRIL 10 MG PO TABS: 10.0000 mg | ORAL_TABLET | Freq: Every day | ORAL | Status: DC

## 2014-04-12 NOTE — Progress Notes (Signed)
Staff message sent to Cammie Sickle, FNP regarding bp check per Dr. Zigmund Daniel instructions. Thanks!

## 2014-04-13 DIAGNOSIS — E785 Hyperlipidemia, unspecified: Secondary | ICD-10-CM | POA: Insufficient documentation

## 2014-04-13 DIAGNOSIS — E781 Pure hyperglyceridemia: Secondary | ICD-10-CM | POA: Insufficient documentation

## 2014-04-13 DIAGNOSIS — F1921 Other psychoactive substance dependence, in remission: Secondary | ICD-10-CM | POA: Insufficient documentation

## 2014-05-05 ENCOUNTER — Ambulatory Visit (INDEPENDENT_AMBULATORY_CARE_PROVIDER_SITE_OTHER): Payer: Self-pay | Admitting: Internal Medicine

## 2014-05-05 ENCOUNTER — Encounter: Payer: Self-pay | Admitting: Internal Medicine

## 2014-05-05 VITALS — BP 155/95 | HR 77 | Temp 98.2°F | Resp 14 | Ht 69.0 in | Wt 153.0 lb

## 2014-05-05 DIAGNOSIS — T24209A Burn of second degree of unspecified site of unspecified lower limb, except ankle and foot, initial encounter: Secondary | ICD-10-CM | POA: Insufficient documentation

## 2014-05-05 DIAGNOSIS — I10 Essential (primary) hypertension: Secondary | ICD-10-CM

## 2014-05-05 DIAGNOSIS — F319 Bipolar disorder, unspecified: Secondary | ICD-10-CM | POA: Insufficient documentation

## 2014-05-05 DIAGNOSIS — R7989 Other specified abnormal findings of blood chemistry: Secondary | ICD-10-CM

## 2014-05-05 DIAGNOSIS — R631 Polydipsia: Secondary | ICD-10-CM

## 2014-05-05 DIAGNOSIS — T24202A Burn of second degree of unspecified site of left lower limb, except ankle and foot, initial encounter: Secondary | ICD-10-CM

## 2014-05-05 DIAGNOSIS — F3110 Bipolar disorder, current episode manic without psychotic features, unspecified: Secondary | ICD-10-CM

## 2014-05-05 DIAGNOSIS — E781 Pure hyperglyceridemia: Secondary | ICD-10-CM

## 2014-05-05 DIAGNOSIS — F1910 Other psychoactive substance abuse, uncomplicated: Secondary | ICD-10-CM

## 2014-05-05 DIAGNOSIS — R946 Abnormal results of thyroid function studies: Secondary | ICD-10-CM

## 2014-05-05 DIAGNOSIS — R358 Other polyuria: Secondary | ICD-10-CM

## 2014-05-05 DIAGNOSIS — R3589 Other polyuria: Secondary | ICD-10-CM

## 2014-05-05 DIAGNOSIS — F17200 Nicotine dependence, unspecified, uncomplicated: Secondary | ICD-10-CM

## 2014-05-05 LAB — HEMOGLOBIN A1C
HEMOGLOBIN A1C: 5.4 % (ref <5.7)
MEAN PLASMA GLUCOSE: 108 mg/dL (ref <117)

## 2014-05-05 LAB — TSH: TSH: 5.177 u[IU]/mL — ABNORMAL HIGH (ref 0.350–4.500)

## 2014-05-05 MED ORDER — SILVER SULFADIAZINE 1 % EX CREA: 1.0000 "application " | TOPICAL_CREAM | Freq: Every day | CUTANEOUS | Status: DC

## 2014-05-05 MED ORDER — OMEGA-3-ACID ETHYL ESTERS 1 G PO CAPS: 2.0000 g | ORAL_CAPSULE | Freq: Two times a day (BID) | ORAL | Status: DC
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
## 2014-05-06 LAB — URINALYSIS W MICROSCOPIC + REFLEX CULTURE
BILIRUBIN URINE: NEGATIVE
CASTS: NONE SEEN
Glucose, UA: NEGATIVE mg/dL
HGB URINE DIPSTICK: NEGATIVE
Ketones, ur: NEGATIVE mg/dL
Leukocytes, UA: NEGATIVE
NITRITE: NEGATIVE
Protein, ur: NEGATIVE mg/dL
Specific Gravity, Urine: >1.03 — ABNORMAL HIGH (ref 1.005–1.030)
Urobilinogen, UA: 0.2 mg/dL (ref 0.0–1.0)
pH: 5 (ref 5.0–8.0)

## 2014-05-06 LAB — MICROALBUMIN, URINE: MICROALB UR: 2.6 mg/dL — AB (ref <2.0)

## 2014-05-12 ENCOUNTER — Telehealth: Payer: Self-pay | Admitting: *Deleted

## 2014-05-12 NOTE — Telephone Encounter (Signed)
Made referral to [facility] for counseling, as per Dr. [name] and Dr. Zigmund Daniel. Landis Gandy, RN

## 2014-05-14 NOTE — Progress Notes (Signed)
Patient ID: Cheryl Thompson, female   DOB: 08-Jul-1966, 48 y.o.   MRN: [id number] TSH elevated. Will repeat TSH with Free T4 and T3. Pt also noted to have microalbuminuria and is currently on lisinopril. Will recheck micro albumin in 3 months to evaluate progression.

## 2014-05-14 NOTE — Addendum Note (Signed)
Addended by: Liston Alba A on: 05/14/2014 06:30 PM   Modules accepted: Orders

## 2014-05-17 NOTE — Progress Notes (Signed)
Left message to  Return call  

## 2014-05-18 ENCOUNTER — Telehealth: Payer: Self-pay

## 2014-05-18 NOTE — Telephone Encounter (Signed)
Called and left message for patient to return call regarding lab work. Thanks!

## 2014-05-19 ENCOUNTER — Telehealth: Payer: Self-pay | Admitting: General Practice

## 2014-05-19 NOTE — Progress Notes (Signed)
Patient advised and coming in today for labs. Thanks!

## 2014-05-19 NOTE — Telephone Encounter (Signed)
Patient returned call returned call regarding lab work .

## 2014-05-19 NOTE — Telephone Encounter (Signed)
Patient advised of lab results and to come in for additional test. Thanks!

## 2014-05-23 ENCOUNTER — Emergency Department (HOSPITAL_COMMUNITY)
Admission: EM | Admit: 2014-05-23 | Discharge: 2014-05-23 | Disposition: A | Discharge status: Home / Self Care | Payer: Self-pay | Attending: Emergency Medicine | Admitting: Emergency Medicine

## 2014-05-23 ENCOUNTER — Encounter (HOSPITAL_COMMUNITY): Payer: Self-pay | Admitting: Emergency Medicine

## 2014-05-23 DIAGNOSIS — Z8659 Personal history of other mental and behavioral disorders: Secondary | ICD-10-CM | POA: Insufficient documentation

## 2014-05-23 DIAGNOSIS — G40909 Epilepsy, unspecified, not intractable, without status epilepticus: Secondary | ICD-10-CM | POA: Insufficient documentation

## 2014-05-23 DIAGNOSIS — Z8719 Personal history of other diseases of the digestive system: Secondary | ICD-10-CM | POA: Insufficient documentation

## 2014-05-23 DIAGNOSIS — Z21 Asymptomatic human immunodeficiency virus [HIV] infection status: Secondary | ICD-10-CM | POA: Insufficient documentation

## 2014-05-23 DIAGNOSIS — Z8619 Personal history of other infectious and parasitic diseases: Secondary | ICD-10-CM | POA: Insufficient documentation

## 2014-05-23 DIAGNOSIS — Z720 Tobacco use: Secondary | ICD-10-CM | POA: Insufficient documentation

## 2014-05-23 DIAGNOSIS — M13161 Monoarthritis, not elsewhere classified, right knee: Secondary | ICD-10-CM

## 2014-05-23 DIAGNOSIS — B20 Human immunodeficiency virus [HIV] disease: Secondary | ICD-10-CM

## 2014-05-23 DIAGNOSIS — M13861 Other specified arthritis, right knee: Secondary | ICD-10-CM | POA: Insufficient documentation

## 2014-05-23 DIAGNOSIS — Z880 Allergy status to penicillin: Secondary | ICD-10-CM | POA: Insufficient documentation

## 2014-05-23 DIAGNOSIS — M109 Gout, unspecified: Secondary | ICD-10-CM | POA: Insufficient documentation

## 2014-05-23 DIAGNOSIS — I10 Essential (primary) hypertension: Secondary | ICD-10-CM | POA: Insufficient documentation

## 2014-05-23 DIAGNOSIS — R739 Hyperglycemia, unspecified: Secondary | ICD-10-CM | POA: Insufficient documentation

## 2014-05-23 DIAGNOSIS — Z79899 Other long term (current) drug therapy: Secondary | ICD-10-CM | POA: Insufficient documentation

## 2014-05-23 DIAGNOSIS — Z792 Long term (current) use of antibiotics: Secondary | ICD-10-CM | POA: Insufficient documentation

## 2014-05-23 LAB — GRAM STAIN

## 2014-05-23 LAB — SYNOVIAL CELL COUNT + DIFF, W/ CRYSTALS
Crystals, Fluid: NONE SEEN
Lymphocytes-Synovial Fld: 1 % (ref 0–20)
MONOCYTE-MACROPHAGE-SYNOVIAL FLUID: 3 % — AB (ref 50–90)
NEUTROPHIL, SYNOVIAL: 96 % — AB (ref 0–25)
WBC, Synovial: 7137 /mm3 — ABNORMAL HIGH (ref 0–200)

## 2014-05-23 LAB — CBG MONITORING, ED: Glucose-Capillary: 219 mg/dL — ABNORMAL HIGH (ref 70–99)

## 2014-05-23 MED ORDER — HYDROMORPHONE HCL 1 MG/ML IJ SOLN
1.0000 mg | Freq: Once | INTRAMUSCULAR | Status: AC
  Administered 2014-05-23: 1 mg via INTRAMUSCULAR
  Filled 2014-05-23: qty 1

## 2014-05-23 MED ORDER — INDOMETHACIN 25 MG PO CAPS: 50.0000 mg | ORAL_CAPSULE | Freq: Two times a day (BID) | ORAL | Status: DC

## 2014-05-23 MED ORDER — LISINOPRIL 10 MG PO TABS
10.0000 mg | ORAL_TABLET | Freq: Every day | ORAL | Status: DC
  Administered 2014-05-23: 10 mg via ORAL
  Filled 2014-05-23: qty 1

## 2014-05-23 MED ORDER — DIPHENHYDRAMINE HCL 25 MG PO CAPS
25.0000 mg | ORAL_CAPSULE | Freq: Once | ORAL | Status: AC
  Administered 2014-05-23: 25 mg via ORAL
  Filled 2014-05-23: qty 1

## 2014-05-23 MED ORDER — AMLODIPINE BESYLATE 5 MG PO TABS
5.0000 mg | ORAL_TABLET | Freq: Every day | ORAL | Status: DC
  Administered 2014-05-23: 5 mg via ORAL
  Filled 2014-05-23: qty 1

## 2014-05-23 MED ORDER — INDOMETHACIN 25 MG PO CAPS
50.0000 mg | ORAL_CAPSULE | Freq: Once | ORAL | Status: AC
  Administered 2014-05-23: 50 mg via ORAL
  Filled 2014-05-23: qty 2

## 2014-05-23 MED ORDER — OXYCODONE-ACETAMINOPHEN 5-325 MG PO TABS
2.0000 | ORAL_TABLET | Freq: Once | ORAL | Status: AC
  Administered 2014-05-23: 2 via ORAL
  Filled 2014-05-23: qty 2

## 2014-05-23 MED ORDER — LIDOCAINE-EPINEPHRINE (PF) 2 %-1:200000 IJ SOLN
10.0000 mL | Freq: Once | INTRAMUSCULAR | Status: AC
  Administered 2014-05-23: 10 mL via INTRADERMAL
  Filled 2014-05-23: qty 20

## 2014-05-23 MED ORDER — OXYCODONE-ACETAMINOPHEN 5-325 MG PO TABS: 1.0000 | ORAL_TABLET | Freq: Four times a day (QID) | ORAL | Status: DC | PRN

## 2014-05-23 MED ORDER — HYDROCHLOROTHIAZIDE 25 MG PO TABS
25.0000 mg | ORAL_TABLET | Freq: Every day | ORAL | Status: DC
  Administered 2014-05-23: 25 mg via ORAL
  Filled 2014-05-23: qty 1

## 2014-05-23 MED ORDER — DIVALPROEX SODIUM ER 250 MG PO TB24
250.0000 mg | ORAL_TABLET | Freq: Three times a day (TID) | ORAL | Status: DC
  Administered 2014-05-23: 250 mg via ORAL
  Filled 2014-05-23 (×3): qty 1

## 2014-05-23 NOTE — Discharge Instructions (Signed)
Call your Infectious disease doctor for further evaluation of your knee swelling and culture reports. Call your orthopedic specialist for further evaluation of your knee pain. Call for a follow up appointment with a Family or Primary Care Provider for further evaluation of your elevated blood sugar reading. Return if Symptoms worsen.   Take medication as prescribed.

## 2014-05-23 NOTE — ED Notes (Addendum)
Pt from home with c/o right knee pain and swelling starting this am when waking up.  Pt reports surgery on her left knee as a teenager, denies any unsual activity yesterday or new injury.  Pt did not take am medications or try anything for pain this am.  Pt in NAD, A&O.

## 2014-05-23 NOTE — ED Notes (Signed)
CBG - 219 

## 2014-05-23 NOTE — ED Provider Notes (Signed)
CSN: [id number]     Arrival date & time 05/23/14  1779 History   First MD Initiated Contact with Patient 05/23/14 0730     No chief complaint on file.    (Consider location/radiation/quality/duration/timing/severity/associated sxs/prior Treatment) HPI Comments: The patient is a 48 year old female with a past medical history of gout, HIV, HT presenting to the emergency department with a chief compliant of Right knee pain and swelling since today. Patient denies injury to the knee. Reports remote surgery over 30 years ago.  She reports history of gout in left knee, denies history of gout flare in right knee.  Reports pain is worse than previous episodes of gout.  Denies treatment prior to arrival. Last CD4: 940 11/26/2013 Infectious disease: [facility]  The history is provided by the patient. No language interpreter was used.    Past Medical History  Diagnosis Date  . HIV (human immunodeficiency virus infection)   . Hypertension   . Depression   . Seizures   . Acute alcoholic hepatitis   . Hepatitis C   . Diverticulitis y-1  . Diverticulosis y-1   Past Surgical History  Procedure Laterality Date  . Total abdominal hysterectomy w/ bilateral salpingoophorectomy  2006  . 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 Abdominal hysterectomy     Family History  Problem Relation Age of Onset  . Drug abuse Mother   . Diabetes Sister   . Diabetes Maternal Aunt   . Hypertension Maternal Aunt   . Hyperlipidemia Maternal Aunt   . Stroke Maternal Grandmother   . Hypertension Maternal Grandmother   . Diabetes Maternal Grandmother   . Stroke Maternal Grandfather   . Alcohol abuse Maternal Grandfather    History  Substance Use Topics  . Smoking status: Current Every Day Smoker -- 0.10 packs/day    Types: Cigarettes  . Smokeless tobacco: Never Used  . Alcohol Use: 0.0 oz/week     Comment: occasional wine   OB History    No data available      Review of Systems  Constitutional: Negative for fever and chills.  Musculoskeletal: Positive for joint swelling, arthralgias and gait problem.  Skin: Negative for color change and wound.      Allergies  Penicillins and Naproxen  Home Medications   Prior to Admission medications   Medication Sig Start Date End Date Taking? Authorizing Provider  amLODipine (NORVASC) 10 MG tablet Take 5 mg by mouth daily. 04/12/14   [name], FNP  cholecalciferol (VITAMIN D) 1000 UNITS tablet Take 1,000 Units by mouth every morning.     Historical Provider, MD  Darunavir Ethanolate (PREZISTA) 800 MG tablet Take 1 tablet (800 mg total) by mouth daily. 03/24/14   Thayer Headings, MD  divalproex (DEPAKOTE ER) 250 MG 24 hr tablet Take 250 mg by mouth 3 (three) times daily. 10/29/12   Vivi Ferns, MD  emtricitabine-tenofovir (TRUVADA) 200-300 MG per tablet Take 1 tablet by mouth daily. 03/24/14   Thayer Headings, MD  hydrochlorothiazide (HYDRODIURIL) 25 MG tablet Take 1 tablet (25 mg total) by mouth daily. 03/16/14   Thayer Headings, MD  Ibuprofen (MIDOL) 200 MG CAPS Take 400 mg by mouth daily as needed (for knee pain).    Historical Provider, MD  lisinopril (PRINIVIL,ZESTRIL) 10 MG tablet Take 1 tablet (10 mg total) by mouth daily. 04/12/14   [name], FNP  omega-3 acid ethyl esters (  LOVAZA) 1 G capsule Take 2 capsules (2 g total) by mouth 2 (two) times daily. 05/05/14   Leana Gamer, MD  oxyCODONE-acetaminophen (PERCOCET) 5-325 MG per tablet Take 1 tablet by mouth every 6 (six) hours as needed. 12/26/13   Wandra Arthurs, MD  promethazine (PHENERGAN) 25 MG tablet Take 1 tablet (25 mg total) by mouth every 6 (six) hours as needed for nausea. 11/29/13   Garald Balding, NP  ritonavir (NORVIR) 100 MG TABS tablet Take 1 tablet (100 mg total) by mouth daily. 03/24/14   Thayer Headings, MD  silver sulfADIAZINE (SILVADENE) 1 % cream Apply 1 application topically daily. 05/05/14   Leana Gamer, MD  Thiamine  HCl (VITAMIN B-1) 250 MG tablet Take 250 mg by mouth daily.    Historical Provider, MD   There were no vitals taken for this visit. Physical Exam  Constitutional: She is oriented to person, place, and time. She appears well-developed and well-nourished. No distress.  HENT:  Head: Normocephalic and atraumatic.  Neck: Neck supple.  Pulmonary/Chest: Effort normal. No respiratory distress.  Musculoskeletal:       Right knee: She exhibits effusion. She exhibits normal range of motion.  Good Passive ROM with pain. Increase in warmth to touch.  Neurological: She is alert and oriented to person, place, and time.  Skin: Skin is warm and dry. She is not diaphoretic.  Psychiatric: She has a normal mood and affect. Her behavior is normal.  Nursing note and vitals reviewed.   ED Course  ARTHOCENTESIS Date/Time: 05/23/2014 9:00 AM Performed by: Harvie Heck Authorized by: Harvie Heck Consent: Verbal consent obtained. Risks and benefits: risks, benefits and alternatives were discussed Consent given by: patient Patient understanding: patient states understanding of the procedure being performed Patient consent: the patient's understanding of the procedure matches consent given Required items: required blood products, implants, devices, and special equipment available Patient identity confirmed: verbally with patient Time out: Immediately prior to procedure a "time out" was called to verify the correct patient, procedure, equipment, support staff and site/side marked as required. Indications: joint swelling,  possible septic joint,  pain and diagnostic evaluation  Body area: knee Joint: right knee Local anesthesia used: yes Anesthesia: local infiltration Local anesthetic: lidocaine 2% with epinephrine Anesthetic total: 3 ml Patient sedated: no Preparation: Patient was prepped and draped in the usual sterile fashion. Needle gauge: 18 G Ultrasound guidance: no Approach: lateral Aspirate:  cloudy,  serous and yellow Aspirate amount: 30 mL Patient tolerance: Patient tolerated the procedure well with no immediate complications   (including critical care time) Labs Review Labs Reviewed  SYNOVIAL CELL COUNT + DIFF, W/ CRYSTALS - Abnormal; Notable for the following:    Appearance-Synovial HAZY (*)    WBC, Synovial 7137 (*)    Neutrophil, Synovial 96 (*)    Monocyte-Macrophage-Synovial Fluid 3 (*)    All other components within normal limits  CBG MONITORING, ED - Abnormal; Notable for the following:    Glucose-Capillary 219 (*)    All other components within normal limits  GRAM STAIN  BODY FLUID CULTURE  BODY FLUID CRYSTAL  GLUCOSE, SYNOVIAL FLUID    Imaging Review No results found.   EKG Interpretation None      MDM   Final diagnoses:  Inflammation of joint of knee, right  Blood glucose elevated  History of HIV infection   Patient with a history of HIV presents with nontraumatic right knee swelling and pain for less than 24 hours. Patient denies  fever, 99.5 on arrival. Likely gout. Doubt septic joint at this time. Discussed with Dr. Delice Lesch oh who also evaluated the patient during this encounter, after review of previous lab work and body fluid results, advises arthrocentesis for further evaluation of swelling. 0850 Pt reports moderate resolution of pain after medication. Labs show 7000 137 WBCs, no organisms seen on Gram stain. Plan to treat with anti-inflammatories, narcotic pain medication. Advised patient follow-up with ID doctor for culture results, orthopedist, and PCP for further evaluation of elevated glucose reading.  Meds given in ED:  Medications  amLODipine (NORVASC) tablet 5 mg (5 mg Oral Given 05/23/14 1012)  divalproex (DEPAKOTE ER) 24 hr tablet 250 mg (250 mg Oral Given 05/23/14 1012)  hydrochlorothiazide (HYDRODIURIL) tablet 25 mg (25 mg Oral Given 05/23/14 1012)  lisinopril (PRINIVIL,ZESTRIL) tablet 10 mg (10 mg Oral Given 05/23/14 1012)   oxyCODONE-acetaminophen (PERCOCET/ROXICET) 5-325 MG per tablet 2 tablet (2 tablets Oral Given 05/23/14 0805)  indomethacin (INDOCIN) capsule 50 mg (50 mg Oral Given 05/23/14 0805)  HYDROmorphone (DILAUDID) injection 1 mg (1 mg Intramuscular Given 05/23/14 0839)  lidocaine-EPINEPHrine (XYLOCAINE W/EPI) 2 %-1:200000 (PF) injection 10 mL (10 mLs Intradermal Given 05/23/14 0839)  diphenhydrAMINE (BENADRYL) capsule 25 mg (25 mg Oral Given 05/23/14 1030)    Discharge Medication List as of 05/23/2014 10:50 AM    START taking these medications   Details  indomethacin (INDOCIN) 25 MG capsule Take 2 capsules (50 mg total) by mouth 2 (two) times daily with a meal., Starting 05/23/2014, Until Discontinued, Print         Harvie Heck, PA-C 05/23/14 [place], MD 05/28/14 1900

## 2014-05-24 LAB — GLUCOSE, SYNOVIAL FLUID: GLUCOSE, SYNOVIAL FLUID: 97 mg/dL

## 2014-05-26 LAB — BODY FLUID CULTURE: Culture: NO GROWTH

## 2014-05-31 ENCOUNTER — Other Ambulatory Visit: Payer: Self-pay | Admitting: Internal Medicine

## 2014-05-31 DIAGNOSIS — R7989 Other specified abnormal findings of blood chemistry: Secondary | ICD-10-CM

## 2014-05-31 DIAGNOSIS — F3110 Bipolar disorder, current episode manic without psychotic features, unspecified: Secondary | ICD-10-CM

## 2014-05-31 DIAGNOSIS — E039 Hypothyroidism, unspecified: Secondary | ICD-10-CM

## 2014-06-01 LAB — TSH: TSH: 5.319 u[IU]/mL — ABNORMAL HIGH (ref 0.350–4.500)

## 2014-06-01 LAB — T4, FREE: Free T4: 0.74 ng/dL — ABNORMAL LOW (ref 0.80–1.80)

## 2014-06-01 LAB — T3, FREE: T3 FREE: 2.6 pg/mL (ref 2.3–4.2)

## 2014-06-03 ENCOUNTER — Telehealth: Payer: Self-pay | Admitting: Internal Medicine

## 2014-06-03 MED ORDER — LEVOTHYROXINE SODIUM 25 MCG PO TABS: 25.0000 ug | ORAL_TABLET | Freq: Every day | ORAL | Status: DC

## 2014-06-03 NOTE — Progress Notes (Signed)
Labs reviewed and patient has elevated TSH and low Free T4. Will start on Synthroid. 25 mcg daily. Recheck TSH in 6 weeks.

## 2014-06-03 NOTE — Telephone Encounter (Signed)
Staff message sent to provider  

## 2014-06-03 NOTE — Telephone Encounter (Signed)
Patient called requesting results from labs.

## 2014-06-03 NOTE — Addendum Note (Signed)
Addended by: Liston Alba A on: 06/03/2014 05:04 PM   Modules accepted: Orders

## 2014-06-04 NOTE — Telephone Encounter (Signed)
Advised patient of lab results and to start synthroid as directed. Patient verbalized understanding. Thanks!

## 2014-06-23 DIAGNOSIS — E039 Hypothyroidism, unspecified: Secondary | ICD-10-CM | POA: Insufficient documentation

## 2014-06-23 NOTE — Addendum Note (Signed)
Addended by: Liston Alba A on: 06/23/2014 05:30 PM   Modules accepted: Orders

## 2014-06-23 NOTE — Progress Notes (Signed)
Orders placed for repeat TSH 6 weeks after initiation if Synthroid.

## 2014-06-24 ENCOUNTER — Encounter (HOSPITAL_COMMUNITY): Payer: Self-pay | Admitting: *Deleted

## 2014-06-24 ENCOUNTER — Telehealth: Payer: Self-pay

## 2014-06-24 ENCOUNTER — Inpatient Hospital Stay (HOSPITAL_COMMUNITY)
Admission: EM | Admit: 2014-06-24 | Discharge: 2014-06-29 | DRG: 885 | Disposition: A | Discharge status: Home / Self Care | Payer: Federal, State, Local not specified - Other | Source: Intra-hospital | Attending: Psychiatry | Admitting: Psychiatry

## 2014-06-24 ENCOUNTER — Emergency Department (HOSPITAL_COMMUNITY)
Admission: EM | Admit: 2014-06-24 | Discharge: 2014-06-24 | Disposition: A | Discharge status: Other Acute Inpatient Hospital | Payer: Self-pay | Attending: Emergency Medicine | Admitting: Emergency Medicine

## 2014-06-24 DIAGNOSIS — B1920 Unspecified viral hepatitis C without hepatic coma: Secondary | ICD-10-CM | POA: Diagnosis present

## 2014-06-24 DIAGNOSIS — Z823 Family history of stroke: Secondary | ICD-10-CM | POA: Diagnosis not present

## 2014-06-24 DIAGNOSIS — B20 Human immunodeficiency virus [HIV] disease: Secondary | ICD-10-CM | POA: Diagnosis present

## 2014-06-24 DIAGNOSIS — F329 Major depressive disorder, single episode, unspecified: Secondary | ICD-10-CM | POA: Diagnosis present

## 2014-06-24 DIAGNOSIS — R45851 Suicidal ideations: Secondary | ICD-10-CM | POA: Diagnosis present

## 2014-06-24 DIAGNOSIS — F3130 Bipolar disorder, current episode depressed, mild or moderate severity, unspecified: Secondary | ICD-10-CM | POA: Diagnosis present

## 2014-06-24 DIAGNOSIS — Z8249 Family history of ischemic heart disease and other diseases of the circulatory system: Secondary | ICD-10-CM | POA: Diagnosis not present

## 2014-06-24 DIAGNOSIS — F32A Depression, unspecified: Secondary | ICD-10-CM | POA: Diagnosis present

## 2014-06-24 DIAGNOSIS — Z880 Allergy status to penicillin: Secondary | ICD-10-CM | POA: Insufficient documentation

## 2014-06-24 DIAGNOSIS — F17210 Nicotine dependence, cigarettes, uncomplicated: Secondary | ICD-10-CM | POA: Diagnosis present

## 2014-06-24 DIAGNOSIS — F1420 Cocaine dependence, uncomplicated: Secondary | ICD-10-CM | POA: Diagnosis present

## 2014-06-24 DIAGNOSIS — Z91410 Personal history of adult physical and sexual abuse: Secondary | ICD-10-CM | POA: Diagnosis not present

## 2014-06-24 DIAGNOSIS — F419 Anxiety disorder, unspecified: Secondary | ICD-10-CM | POA: Diagnosis present

## 2014-06-24 DIAGNOSIS — I10 Essential (primary) hypertension: Secondary | ICD-10-CM | POA: Diagnosis present

## 2014-06-24 DIAGNOSIS — G40909 Epilepsy, unspecified, not intractable, without status epilepticus: Secondary | ICD-10-CM | POA: Insufficient documentation

## 2014-06-24 DIAGNOSIS — F1220 Cannabis dependence, uncomplicated: Secondary | ICD-10-CM | POA: Diagnosis present

## 2014-06-24 DIAGNOSIS — G4700 Insomnia, unspecified: Secondary | ICD-10-CM | POA: Diagnosis present

## 2014-06-24 DIAGNOSIS — Z79899 Other long term (current) drug therapy: Secondary | ICD-10-CM | POA: Insufficient documentation

## 2014-06-24 DIAGNOSIS — Z634 Disappearance and death of family member: Secondary | ICD-10-CM | POA: Diagnosis not present

## 2014-06-24 DIAGNOSIS — G8929 Other chronic pain: Secondary | ICD-10-CM | POA: Diagnosis present

## 2014-06-24 DIAGNOSIS — F319 Bipolar disorder, unspecified: Secondary | ICD-10-CM

## 2014-06-24 DIAGNOSIS — R569 Unspecified convulsions: Secondary | ICD-10-CM | POA: Insufficient documentation

## 2014-06-24 DIAGNOSIS — Z720 Tobacco use: Secondary | ICD-10-CM | POA: Insufficient documentation

## 2014-06-24 DIAGNOSIS — F1020 Alcohol dependence, uncomplicated: Secondary | ICD-10-CM | POA: Diagnosis present

## 2014-06-24 DIAGNOSIS — E781 Pure hyperglyceridemia: Secondary | ICD-10-CM

## 2014-06-24 DIAGNOSIS — Z8719 Personal history of other diseases of the digestive system: Secondary | ICD-10-CM | POA: Insufficient documentation

## 2014-06-24 DIAGNOSIS — R45850 Homicidal ideations: Secondary | ICD-10-CM | POA: Insufficient documentation

## 2014-06-24 DIAGNOSIS — E039 Hypothyroidism, unspecified: Secondary | ICD-10-CM

## 2014-06-24 DIAGNOSIS — F315 Bipolar disorder, current episode depressed, severe, with psychotic features: Principal | ICD-10-CM | POA: Diagnosis present

## 2014-06-24 DIAGNOSIS — Z833 Family history of diabetes mellitus: Secondary | ICD-10-CM

## 2014-06-24 LAB — TROPONIN I: Troponin I: <0.3 ng/mL (ref <0.30)

## 2014-06-24 LAB — URINALYSIS, ROUTINE W REFLEX MICROSCOPIC
Bilirubin Urine: NEGATIVE
Glucose, UA: NEGATIVE mg/dL
Hgb urine dipstick: NEGATIVE
Ketones, ur: NEGATIVE mg/dL
Leukocytes, UA: NEGATIVE
NITRITE: NEGATIVE
Protein, ur: NEGATIVE mg/dL
SPECIFIC GRAVITY, URINE: 1.013 (ref 1.005–1.030)
UROBILINOGEN UA: 0.2 mg/dL (ref 0.0–1.0)
pH: 6 (ref 5.0–8.0)

## 2014-06-24 LAB — CBC WITH DIFFERENTIAL/PLATELET
Basophils Absolute: 0 10*3/uL (ref 0.0–0.1)
Basophils Relative: 0 % (ref 0–1)
EOS ABS: 0.2 10*3/uL (ref 0.0–0.7)
Eosinophils Relative: 3 % (ref 0–5)
HCT: 32.2 % — ABNORMAL LOW (ref 36.0–46.0)
HEMOGLOBIN: 10.6 g/dL — AB (ref 12.0–15.0)
LYMPHS ABS: 3.2 10*3/uL (ref 0.7–4.0)
Lymphocytes Relative: 44 % (ref 12–46)
MCH: 33.1 pg (ref 26.0–34.0)
MCHC: 32.9 g/dL (ref 30.0–36.0)
MCV: 100.6 fL — ABNORMAL HIGH (ref 78.0–100.0)
MONOS PCT: 8 % (ref 3–12)
Monocytes Absolute: 0.6 10*3/uL (ref 0.1–1.0)
Neutro Abs: 3.3 10*3/uL (ref 1.7–7.7)
Neutrophils Relative %: 45 % (ref 43–77)
PLATELETS: 127 10*3/uL — AB (ref 150–400)
RBC: 3.2 MIL/uL — AB (ref 3.87–5.11)
RDW: 13.5 % (ref 11.5–15.5)
WBC: 7.3 10*3/uL (ref 4.0–10.5)

## 2014-06-24 LAB — COMPREHENSIVE METABOLIC PANEL
ALBUMIN: 3.4 g/dL — AB (ref 3.5–5.2)
ALK PHOS: 73 U/L (ref 39–117)
ALT: 42 U/L — ABNORMAL HIGH (ref 0–35)
ANION GAP: 15 (ref 5–15)
AST: 67 U/L — ABNORMAL HIGH (ref 0–37)
BILIRUBIN TOTAL: 0.4 mg/dL (ref 0.3–1.2)
BUN: 8 mg/dL (ref 6–23)
CHLORIDE: 99 meq/L (ref 96–112)
CO2: 23 meq/L (ref 19–32)
Calcium: 9 mg/dL (ref 8.4–10.5)
Creatinine, Ser: 0.79 mg/dL (ref 0.50–1.10)
GFR calc Af Amer: >90 mL/min (ref >90)
GFR calc non Af Amer: >90 mL/min (ref >90)
Glucose, Bld: 77 mg/dL (ref 70–99)
POTASSIUM: 3.8 meq/L (ref 3.7–5.3)
Sodium: 137 mEq/L (ref 137–147)
Total Protein: 8.4 g/dL — ABNORMAL HIGH (ref 6.0–8.3)

## 2014-06-24 LAB — RAPID URINE DRUG SCREEN, HOSP PERFORMED
Amphetamines: NOT DETECTED
BARBITURATES: NOT DETECTED
Benzodiazepines: NOT DETECTED
Cocaine: POSITIVE — AB
Opiates: NOT DETECTED
TETRAHYDROCANNABINOL: POSITIVE — AB

## 2014-06-24 LAB — ACETAMINOPHEN LEVEL: Acetaminophen (Tylenol), Serum: <15 ug/mL (ref 10–30)

## 2014-06-24 LAB — ETHANOL: Alcohol, Ethyl (B): <11 mg/dL (ref 0–11)

## 2014-06-24 MED ORDER — DARUNAVIR ETHANOLATE 800 MG PO TABS
800.0000 mg | ORAL_TABLET | Freq: Every day | ORAL | Status: DC
  Administered 2014-06-25 – 2014-06-29 (×5): 800 mg via ORAL
  Filled 2014-06-24 (×7): qty 1

## 2014-06-24 MED ORDER — INDOMETHACIN 25 MG PO CAPS
50.0000 mg | ORAL_CAPSULE | Freq: Two times a day (BID) | ORAL | Status: DC
  Administered 2014-06-25 – 2014-06-29 (×9): 50 mg via ORAL
  Filled 2014-06-24 (×13): qty 1

## 2014-06-24 MED ORDER — MAGNESIUM HYDROXIDE 400 MG/5ML PO SUSP: 30.0000 mL | Freq: Every day | ORAL | Status: DC | PRN

## 2014-06-24 MED ORDER — EMTRICITABINE-TENOFOVIR DF 200-300 MG PO TABS: 1.0000 | ORAL_TABLET | Freq: Every day | ORAL | Status: DC

## 2014-06-24 MED ORDER — HYDROCHLOROTHIAZIDE 25 MG PO TABS
25.0000 mg | ORAL_TABLET | Freq: Every day | ORAL | Status: DC
  Administered 2014-06-24: 25 mg via ORAL
  Filled 2014-06-24: qty 1

## 2014-06-24 MED ORDER — LORAZEPAM 1 MG PO TABS: 0.0000 mg | ORAL_TABLET | Freq: Two times a day (BID) | ORAL | Status: DC

## 2014-06-24 MED ORDER — DIVALPROEX SODIUM ER 250 MG PO TB24: 250.0000 mg | ORAL_TABLET | Freq: Three times a day (TID) | ORAL | Status: DC

## 2014-06-24 MED ORDER — ALUM & MAG HYDROXIDE-SIMETH 200-200-20 MG/5ML PO SUSP
30.0000 mL | ORAL | Status: DC | PRN
  Administered 2014-06-28: 30 mL via ORAL
  Filled 2014-06-24: qty 30

## 2014-06-24 MED ORDER — ACETAMINOPHEN 325 MG PO TABS
650.0000 mg | ORAL_TABLET | Freq: Four times a day (QID) | ORAL | Status: DC | PRN
Start: 2014-06-24 — End: 2014-06-29
  Administered 2014-06-25 – 2014-06-27 (×3): 650 mg via ORAL
  Filled 2014-06-24 (×4): qty 2

## 2014-06-24 MED ORDER — RITONAVIR 100 MG PO TABS: 100.0000 mg | ORAL_TABLET | Freq: Every day | ORAL | Status: DC

## 2014-06-24 MED ORDER — AMLODIPINE BESYLATE 5 MG PO TABS
5.0000 mg | ORAL_TABLET | Freq: Every day | ORAL | Status: DC
  Administered 2014-06-25 – 2014-06-29 (×5): 5 mg via ORAL
  Filled 2014-06-24 (×9): qty 1

## 2014-06-24 MED ORDER — EMTRICITABINE-TENOFOVIR DF 200-300 MG PO TABS
1.0000 | ORAL_TABLET | Freq: Every day | ORAL | Status: DC
  Administered 2014-06-25 – 2014-06-29 (×5): 1 via ORAL
  Filled 2014-06-24 (×7): qty 1

## 2014-06-24 MED ORDER — LISINOPRIL 10 MG PO TABS
10.0000 mg | ORAL_TABLET | Freq: Every day | ORAL | Status: DC
  Administered 2014-06-24: 10 mg via ORAL
  Filled 2014-06-24: qty 1

## 2014-06-24 MED ORDER — LEVOTHYROXINE SODIUM 25 MCG PO TABS: 25.0000 ug | ORAL_TABLET | Freq: Every day | ORAL | Status: DC

## 2014-06-24 MED ORDER — RITONAVIR 100 MG PO TABS
100.0000 mg | ORAL_TABLET | Freq: Every day | ORAL | Status: DC
  Administered 2014-06-25 – 2014-06-29 (×5): 100 mg via ORAL
  Filled 2014-06-24 (×7): qty 1

## 2014-06-24 MED ORDER — VITAMIN D3 25 MCG (1000 UNIT) PO TABS
1000.0000 [IU] | ORAL_TABLET | Freq: Every morning | ORAL | Status: DC
  Administered 2014-06-25 – 2014-06-29 (×5): 1000 [IU] via ORAL
  Filled 2014-06-24 (×8): qty 1

## 2014-06-24 MED ORDER — LORAZEPAM 1 MG PO TABS
0.0000 mg | ORAL_TABLET | Freq: Four times a day (QID) | ORAL | Status: DC
Start: 2014-06-24 — End: 2014-06-24

## 2014-06-24 MED ORDER — TRAZODONE HCL 50 MG PO TABS
50.0000 mg | ORAL_TABLET | Freq: Every evening | ORAL | Status: DC | PRN
  Administered 2014-06-25: 50 mg via ORAL
  Filled 2014-06-24: qty 1
  Filled 2014-06-24: qty 14

## 2014-06-24 MED ORDER — LEVOTHYROXINE SODIUM 25 MCG PO TABS
25.0000 ug | ORAL_TABLET | Freq: Every day | ORAL | Status: DC
  Administered 2014-06-25 – 2014-06-29 (×5): 25 ug via ORAL
  Filled 2014-06-24 (×8): qty 1

## 2014-06-24 MED ORDER — HYDROCHLOROTHIAZIDE 25 MG PO TABS
25.0000 mg | ORAL_TABLET | Freq: Every day | ORAL | Status: DC
  Administered 2014-06-25 – 2014-06-29 (×5): 25 mg via ORAL
  Filled 2014-06-24 (×8): qty 1

## 2014-06-24 MED ORDER — LISINOPRIL 10 MG PO TABS
10.0000 mg | ORAL_TABLET | Freq: Every day | ORAL | Status: DC
  Administered 2014-06-25 – 2014-06-29 (×5): 10 mg via ORAL
  Filled 2014-06-24: qty 2
  Filled 2014-06-24 (×7): qty 1

## 2014-06-24 MED ORDER — AMLODIPINE BESYLATE 5 MG PO TABS
5.0000 mg | ORAL_TABLET | Freq: Every day | ORAL | Status: DC
  Administered 2014-06-24: 5 mg via ORAL
  Filled 2014-06-24: qty 1

## 2014-06-24 MED ORDER — INDOMETHACIN 25 MG PO CAPS
50.0000 mg | ORAL_CAPSULE | Freq: Three times a day (TID) | ORAL | Status: DC | PRN
Start: 2014-06-24 — End: 2014-06-24
  Administered 2014-06-24: 50 mg via ORAL
  Filled 2014-06-24 (×2): qty 2

## 2014-06-24 MED ORDER — DARUNAVIR ETHANOLATE 800 MG PO TABS: 800.0000 mg | ORAL_TABLET | Freq: Every day | ORAL | Status: DC

## 2014-06-24 MED ORDER — OMEGA-3-ACID ETHYL ESTERS 1 G PO CAPS
2.0000 g | ORAL_CAPSULE | Freq: Two times a day (BID) | ORAL | Status: DC
  Administered 2014-06-25 – 2014-06-29 (×9): 2 g via ORAL
  Filled 2014-06-24 (×14): qty 2

## 2014-06-24 NOTE — ED Notes (Signed)
Called pharmacy to check on delay in getting pt meds that were requested at 1400. They advise are waiting for pharmacist to verify

## 2014-06-24 NOTE — ED Notes (Addendum)
[facility] has escorted the pt to [facility].

## 2014-06-24 NOTE — ED Provider Notes (Addendum)
CSN: [id number]     Arrival date & time 06/24/14  1125 History   First MD Initiated Contact with Patient 06/24/14 1139     Chief Complaint  Patient presents with  . Suicidal     (Consider location/radiation/quality/duration/timing/severity/associated sxs/prior Treatment) HPI Comments: Patient presents from [place]. She has a history of bipolar disorder but states she's been off her medicines for a long time. She states that her mom died recently. She states over the last 2 weeks she is relapsed into alcohol and drug use and she's been drinking and doing drugs daily. She drinks alcohol daily. She's been using cocaine and marijuana daily. She states she's been having daily thoughts of suicide and she's been having daily thoughts of killing her family members and her neighbors. She's had thoughts of waking up during the night and killing her family including killing herself. She was transferred over here because she's been complaining of chest pain throughout the day. She states it started about 8:30 this morning it's been ongoing through the day. Some left-sided chest and nonradiating. She denies any cough or congestion. She denies any shortness of breath or diaphoresis. She denies any nausea or vomiting. She also has some low back pain. There some radiation down her legs. She denies any numbness or weakness in her legs. She denies any loss of bowel or bladder control. She has some pain in both her knees but she does have an underlying history of gout and she says her pain is similar to her past gout flareups. She denies any recent falls or injuries.   Past Medical History  Diagnosis Date  . HIV (human immunodeficiency virus infection)   . Hypertension   . Depression   . Seizures   . Acute alcoholic hepatitis   . Hepatitis C   . Diverticulitis y-1  . Diverticulosis y-1   Past Surgical History  Procedure Laterality Date  . Total abdominal hysterectomy w/ bilateral salpingoophorectomy  2006   . 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 Abdominal hysterectomy     Family History  Problem Relation Age of Onset  . Drug abuse Mother   . Diabetes Sister   . Diabetes Maternal Aunt   . Hypertension Maternal Aunt   . Hyperlipidemia Maternal Aunt   . Stroke Maternal Grandmother   . Hypertension Maternal Grandmother   . Diabetes Maternal Grandmother   . Stroke Maternal Grandfather   . Alcohol abuse Maternal Grandfather    History  Substance Use Topics  . Smoking status: Current Every Day Smoker -- 0.10 packs/day    Types: Cigarettes  . Smokeless tobacco: Never Used  . Alcohol Use: 0.0 oz/week     Comment: occasional wine   OB History    No data available     Review of Systems  Constitutional: Negative for fever, chills, diaphoresis and fatigue.  HENT: Negative for congestion, rhinorrhea and sneezing.   Eyes: Negative.   Respiratory: Negative for cough, chest tightness and shortness of breath.   Cardiovascular: Positive for chest pain. Negative for leg swelling.  Gastrointestinal: Negative for nausea, vomiting, abdominal pain, diarrhea and blood in stool.  Genitourinary: Negative for frequency, hematuria, flank pain and difficulty urinating.  Musculoskeletal: Positive for back pain and arthralgias.  Skin: Negative for rash.  Neurological: Negative for dizziness, speech difficulty, weakness, numbness and headaches.  Psychiatric/Behavioral: Positive for suicidal ideas, sleep disturbance and dysphoric mood. The patient is nervous/anxious.  Allergies  Penicillins and Naproxen  Home Medications   Prior to Admission medications   Medication Sig Start Date End Date Taking? Authorizing Provider  amLODipine (NORVASC) 10 MG tablet Take 5 mg by mouth daily. 04/12/14  Yes [name], FNP  cholecalciferol (VITAMIN D) 1000 UNITS tablet Take 1,000 Units by mouth every morning.    Yes Historical Provider, MD  Darunavir Ethanolate  (PREZISTA) 800 MG tablet Take 1 tablet (800 mg total) by mouth daily. 03/24/14  Yes Thayer Headings, MD  divalproex (DEPAKOTE ER) 250 MG 24 hr tablet Take 250 mg by mouth 3 (three) times daily. 10/29/12  Yes Vivi Ferns, MD  emtricitabine-tenofovir (TRUVADA) 200-300 MG per tablet Take 1 tablet by mouth daily. 03/24/14  Yes Thayer Headings, MD  hydrochlorothiazide (HYDRODIURIL) 25 MG tablet Take 1 tablet (25 mg total) by mouth daily. 03/16/14  Yes Thayer Headings, MD  indomethacin (INDOCIN) 25 MG capsule Take 2 capsules (50 mg total) by mouth 2 (two) times daily with a meal. 05/23/14  Yes Harvie Heck, PA-C  levothyroxine (SYNTHROID, LEVOTHROID) 25 MCG tablet Take 1 tablet (25 mcg total) by mouth daily before breakfast. Do not take wit other medications 06/03/14  Yes Leana Gamer, MD  lisinopril (PRINIVIL,ZESTRIL) 10 MG tablet Take 1 tablet (10 mg total) by mouth daily. 04/12/14  Yes [name], FNP  oxyCODONE-acetaminophen (PERCOCET/ROXICET) 5-325 MG per tablet Take 1-2 tablets by mouth every 6 (six) hours as needed for severe pain. 05/23/14  Yes Lauren Jerline Pain, PA-C  ritonavir (NORVIR) 100 MG TABS tablet Take 1 tablet (100 mg total) by mouth daily. 03/24/14  Yes Thayer Headings, MD  Thiamine HCl (VITAMIN B-1) 250 MG tablet Take 250 mg by mouth daily.   Yes Historical Provider, MD  omega-3 acid ethyl esters (LOVAZA) 1 G capsule Take 2 capsules (2 g total) by mouth 2 (two) times daily. Patient not taking: Reported on 06/24/2014 05/05/14   Leana Gamer, MD  promethazine (PHENERGAN) 25 MG tablet Take 1 tablet (25 mg total) by mouth every 6 (six) hours as needed for nausea. Patient not taking: Reported on 06/24/2014 11/29/13   Garald Balding, NP  silver sulfADIAZINE (SILVADENE) 1 % cream Apply 1 application topically daily. Patient not taking: Reported on 06/24/2014 05/05/14   Leana Gamer, MD   BP 166/89 mmHg  Pulse 72  Temp(Src) 99.2 F (37.3 C) (Oral)  Resp 14  SpO2 100% Physical Exam   Constitutional: She is oriented to person, place, and time. She appears well-developed and well-nourished.  HENT:  Head: Normocephalic and atraumatic.  Mouth/Throat: Oropharynx is clear and moist.  Eyes: Pupils are equal, round, and reactive to light.  Neck: Normal range of motion. Neck supple.  Cardiovascular: Normal rate, regular rhythm and normal heart sounds.   Pulmonary/Chest: Effort normal and breath sounds normal. No respiratory distress. She has no wheezes. She has no rales. She exhibits no tenderness.  Abdominal: Soft. Bowel sounds are normal. There is no tenderness. There is no rebound and no guarding.  Musculoskeletal: Normal range of motion. She exhibits no edema.  Tenderness across the lumbar musculature bilaterally, no mild lower lumbar spinal tenderness.  No step off or deformity.  No red or swollen joints  Lymphadenopathy:    She has no cervical adenopathy.  Neurological: She is alert and oriented to person, place, and time. She has normal strength. No sensory deficit.  Negative SLR bilaterally  Skin: Skin is warm and dry. No rash  noted.  Psychiatric: She has a normal mood and affect.    ED Course  Procedures (including critical care time) Labs Review Labs Reviewed  COMPREHENSIVE METABOLIC PANEL - Abnormal; Notable for the following:    Total Protein 8.4 (*)    Albumin 3.4 (*)    AST 67 (*)    ALT 42 (*)    All other components within normal limits  CBC WITH DIFFERENTIAL - Abnormal; Notable for the following:    RBC 3.20 (*)    Hemoglobin 10.6 (*)    HCT 32.2 (*)    MCV 100.6 (*)    Platelets 127 (*)    All other components within normal limits  URINE RAPID DRUG SCREEN (HOSP PERFORMED) - Abnormal; Notable for the following:    Cocaine POSITIVE (*)    Tetrahydrocannabinol POSITIVE (*)    All other components within normal limits  ACETAMINOPHEN LEVEL  ETHANOL  TROPONIN I  URINALYSIS, ROUTINE W REFLEX MICROSCOPIC    Imaging Review No results found.    EKG Interpretation   Date/Time:  Thursday June 24 2014 11:36:19 EST Ventricular Rate:  69 PR Interval:  114 QRS Duration: 129 QT Interval:  449 QTC Calculation: 481 R Axis:   -54 Text Interpretation:  Sinus rhythm Borderline short PR interval RBBB and  LAFB noted on prior tracings Confirmed by Win Guajardo  MD, Deonte Otting ([id number]) on  06/24/2014 12:01:41 PM      MDM   Final diagnoses:  Bipolar 1 disorder  Suicidal ideation  Homicidal ideation    Pt has been medically cleared.  EKG does not show ischemia.  Troponin negative. Patient has been assessed by TTS who recommends inpatient psychiatric treatment and placement is pending.    Malvin Johns, MD 06/24/14 [place], MD 06/24/14 [phone]

## 2014-06-24 NOTE — ED Notes (Addendum)
[facility] contacted to transport pt to [facility]. 

## 2014-06-24 NOTE — BH Assessment (Signed)
[facility] Assessment Progress Note  Discussed clinicals with Dr. Lenor Derrick prior to seeing patient. Also, discussed recommendations for treatment of this patient prior to seeing patient.

## 2014-06-24 NOTE — Discharge Instructions (Signed)
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
## 2014-06-24 NOTE — ED Notes (Signed)
Pt escorted over to [facility] by [employer] and belongings given to [employer] by This RN

## 2014-06-24 NOTE — Tx Team (Signed)
Initial Interdisciplinary Treatment Plan   PATIENT STRESSORS: Financial difficulties Health problems Loss of mother Medication change or noncompliance Substance abuse   PATIENT STRENGTHS: Average or above average intelligence Communication skills General fund of knowledge Religious Affiliation Supportive family/friends   PROBLEM LIST: Problem List/Patient Goals Date to be addressed Date deferred Reason deferred Estimated date of resolution  "I want to learn about what's wrong with me"  06/24/14     "Take medication or whatever it is that's going to help me"  06/24/14                                                DISCHARGE CRITERIA:  Improved stabilization in mood, thinking, and/or behavior Need for constant or close observation no longer present Reduction of life-threatening or endangering symptoms to within safe limits  PRELIMINARY DISCHARGE PLAN: Outpatient therapy  PATIENT/FAMIILY INVOLVEMENT: This treatment plan has been presented to and reviewed with the patient, Cheryl Thompson.  The patient and family have been given the opportunity to ask questions and make suggestions.  Karie Kirks 06/24/2014, 11:37 PM

## 2014-06-24 NOTE — ED Notes (Addendum)
Pt is asleep. Breathing is easy and unlabored. Sitter at bedside.
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
## 2014-06-24 NOTE — ED Provider Notes (Signed)
Pt accepted to [facility] by Dr. Shea Evans. I was not directly involved in pt dispo planning  1. Bipolar 1 disorder   2. Suicidal ideation   3. Homicidal ideation      Ernestina Patches, MD 06/24/14 [phone]

## 2014-06-24 NOTE — Telephone Encounter (Signed)
Left patient a message to call back. She needs to come in on 07/19/2014 for lab draw only to re-check on thyroid. Thanks!

## 2014-06-25 ENCOUNTER — Encounter (HOSPITAL_COMMUNITY): Payer: Self-pay | Admitting: Psychiatry

## 2014-06-25 DIAGNOSIS — F1420 Cocaine dependence, uncomplicated: Secondary | ICD-10-CM | POA: Diagnosis present

## 2014-06-25 DIAGNOSIS — F1490 Cocaine use, unspecified, uncomplicated: Secondary | ICD-10-CM

## 2014-06-25 DIAGNOSIS — F1220 Cannabis dependence, uncomplicated: Secondary | ICD-10-CM | POA: Diagnosis present

## 2014-06-25 DIAGNOSIS — F1020 Alcohol dependence, uncomplicated: Secondary | ICD-10-CM | POA: Diagnosis present

## 2014-06-25 DIAGNOSIS — F1290 Cannabis use, unspecified, uncomplicated: Secondary | ICD-10-CM

## 2014-06-25 DIAGNOSIS — F3130 Bipolar disorder, current episode depressed, mild or moderate severity, unspecified: Secondary | ICD-10-CM | POA: Diagnosis present

## 2014-06-25 DIAGNOSIS — Z634 Disappearance and death of family member: Secondary | ICD-10-CM

## 2014-06-25 DIAGNOSIS — F1099 Alcohol use, unspecified with unspecified alcohol-induced disorder: Secondary | ICD-10-CM

## 2014-06-25 LAB — VALPROIC ACID LEVEL: Valproic Acid Lvl: <10 ug/mL — ABNORMAL LOW (ref 50.0–100.0)

## 2014-06-25 MED ORDER — BENZTROPINE MESYLATE 0.5 MG PO TABS
0.5000 mg | ORAL_TABLET | Freq: Two times a day (BID) | ORAL | Status: DC
  Administered 2014-06-25 – 2014-06-29 (×8): 0.5 mg via ORAL
  Filled 2014-06-25 (×13): qty 1

## 2014-06-25 MED ORDER — LOPERAMIDE HCL 2 MG PO CAPS
2.0000 mg | ORAL_CAPSULE | ORAL | Status: AC | PRN
  Filled 2014-06-25: qty 2

## 2014-06-25 MED ORDER — LORAZEPAM 2 MG/ML IJ SOLN: 2.0000 mg | INTRAMUSCULAR | Status: DC | PRN

## 2014-06-25 MED ORDER — THIAMINE HCL 100 MG/ML IJ SOLN
100.0000 mg | Freq: Once | INTRAMUSCULAR | Status: AC
  Administered 2014-06-25: 100 mg via INTRAMUSCULAR
  Filled 2014-06-25: qty 2

## 2014-06-25 MED ORDER — LAMOTRIGINE 25 MG PO TABS
25.0000 mg | ORAL_TABLET | Freq: Every day | ORAL | Status: DC
  Administered 2014-06-25 – 2014-06-28 (×4): 25 mg via ORAL
  Filled 2014-06-25 (×6): qty 1

## 2014-06-25 MED ORDER — HYDROXYZINE HCL 25 MG PO TABS
25.0000 mg | ORAL_TABLET | Freq: Four times a day (QID) | ORAL | Status: AC | PRN
  Administered 2014-06-25 – 2014-06-28 (×2): 25 mg via ORAL
  Filled 2014-06-25 (×2): qty 1

## 2014-06-25 MED ORDER — LEVETIRACETAM 500 MG PO TABS
500.0000 mg | ORAL_TABLET | Freq: Two times a day (BID) | ORAL | Status: DC
  Administered 2014-06-25 – 2014-06-29 (×8): 500 mg via ORAL
  Filled 2014-06-25 (×14): qty 1

## 2014-06-25 MED ORDER — LORAZEPAM 1 MG PO TABS
1.0000 mg | ORAL_TABLET | Freq: Four times a day (QID) | ORAL | Status: AC | PRN
  Administered 2014-06-25: 1 mg via ORAL
  Filled 2014-06-25: qty 1

## 2014-06-25 MED ORDER — GABAPENTIN 100 MG PO CAPS
100.0000 mg | ORAL_CAPSULE | Freq: Three times a day (TID) | ORAL | Status: DC
  Administered 2014-06-25 – 2014-06-29 (×12): 100 mg via ORAL
  Filled 2014-06-25 (×17): qty 1

## 2014-06-25 MED ORDER — VITAMIN B-1 100 MG PO TABS
100.0000 mg | ORAL_TABLET | Freq: Every day | ORAL | Status: DC
  Administered 2014-06-26 – 2014-06-29 (×4): 100 mg via ORAL
  Filled 2014-06-25 (×6): qty 1

## 2014-06-25 MED ORDER — HALOPERIDOL 5 MG PO TABS
5.0000 mg | ORAL_TABLET | Freq: Two times a day (BID) | ORAL | Status: DC
  Administered 2014-06-25 – 2014-06-29 (×8): 5 mg via ORAL
  Filled 2014-06-25 (×12): qty 1

## 2014-06-25 MED ORDER — GABAPENTIN 300 MG PO CAPS: 300.0000 mg | ORAL_CAPSULE | Freq: Three times a day (TID) | ORAL | Status: DC

## 2014-06-25 MED ORDER — ADULT MULTIVITAMIN W/MINERALS CH
1.0000 | ORAL_TABLET | Freq: Every day | ORAL | Status: DC
  Administered 2014-06-25 – 2014-06-29 (×5): 1 via ORAL
  Filled 2014-06-25 (×8): qty 1

## 2014-06-25 MED ORDER — ONDANSETRON 4 MG PO TBDP: 4.0000 mg | ORAL_TABLET | Freq: Four times a day (QID) | ORAL | Status: AC | PRN
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
## 2014-06-25 NOTE — Progress Notes (Signed)
Adult Psychoeducational Group Note  Date:  06/25/2014 Time:  9:33 PM  Group Topic/Focus:  Wrap-Up Group:   The focus of this group is to help patients review their daily goal of treatment and discuss progress on daily workbooks.  Participation Level:  Active  Participation Quality:  Appropriate  Affect:  Appropriate  Cognitive:  Appropriate  Insight: Appropriate  Engagement in Group:  Engaged  Modes of Intervention:  Discussion  Additional Comments: The patient expressed that in group she learned to take care of herself.The patient also said that talking to staff and other patients made her think more of herself.  Nash Shearer 06/25/2014, 9:33 PM

## 2014-06-25 NOTE — BHH Group Notes (Signed)
[facility] LCSW Aftercare Discharge Planning Group Note   06/25/2014 11:38 AM  Participation Quality:  Minimal   Mood/Affect:  Depressed, Flat and Irritable  Depression Rating:  10  Anxiety Rating:  10  Thoughts of Suicide:  Yes Will you contract for safety?   Yes  Current AVH:  No  Plan for Discharge/Comments:  Pt reports that she is angry about her mother's death and does not know how she feels about being in hospital. She reported that her friend took her to [facility], who then [facility] her to the hospital due to SI/HI/Paranoia. Pt not able to provide additional info at this time >CSW assessing. Pt reports that she lives with her sister. Her mother just passed away in early Dec-thus triggering these symptoms.   Transportation Means: unknown at this time.   Supports: some family/friend supports.   Smart, [employer]
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
## 2014-06-25 NOTE — Progress Notes (Signed)
D:  Patient's self inventory sheet, patient sleeps fair, no sleep medication needed.  Fair appetite, low energy level, poor concentration.  Rated depression, hopeless and anxiety #10.  Denied withdrawals.  Experiences knee pain, gout.  Goal is to figure out what is wrong with me.  Plans to talk to people.  No discharge plan.  No problems anticipated after discharge. A:  Medications administered per MD orders.  Emotional support and encouragement given patient. R:  Denied HI.  Denied A/V hallucinations.  SI off/on, stated she cannot hurt herself while in [facility].  No plan at this time.  Safety maintained with 15 minute checks. Patient using wheelchair to ambulate.

## 2014-06-25 NOTE — Progress Notes (Signed)
Nutrition Brief Note Patient identified on the Malnutrition Screening Tool (MST) Report.  Wt Readings from Last 10 Encounters:  06/24/14 140 lb (63.504 kg)  05/23/14 154 lb (69.854 kg)  05/05/14 153 lb (69.4 kg)  04/05/14 157 lb (71.215 kg)  03/16/14 155 lb (70.308 kg)  12/10/13 161 lb (73.029 kg)  11/28/13 165 lb (74.844 kg)  04/09/13 171 lb (77.565 kg)  03/12/13 165 lb (74.844 kg)  02/09/13 160 lb (72.576 kg)   Body mass index is 21.59 kg/(m^2). Patient meets criteria for normal body weight based on current BMI.   Discussed intake PTA with patient and compared to intake presently.  Discussed changes in intake, if any, and encouraged adequate intake of meals and snacks. Current diet order is Regular and pt is also offered choice of unit snacks mid-morning and mid-afternoon.  Pt is eating as desired.  Does not wish to have nutritional supplements sent at this time. Pt reports that she was not eating well for 2 weeks PTA, but her appetite has returned.   Labs and medications reviewed.   Nutrition Dx:  Unintended wt change r/t suboptimal oral intake AEB pt report  Interventions: . Discussed the importance of nutrition and encouraged intake of food and beverages.   . Discussed weight goals with patient. . Supplements: none    No additional nutrition interventions warranted at this time. If nutrition issues arise, please consult RD.   Laurette Schimke MS, RD, LDN

## 2014-06-25 NOTE — Plan of Care (Signed)
Problem: Consults Goal: Depression Patient Education See Patient Education Module for education specifics.  Outcome: Completed/Met Date Met:  06/25/14 Nurse discussed depression with patient.

## 2014-06-25 NOTE — [facility] Group Notes (Signed)
[facility] LCSW Group Therapy  06/25/2014 1:34 PM  Type of Therapy:  Group Therapy  Participation Level: Pt in room resting chose not to attend: Did Not Attend  Summary of Progress/Problems: Group Topic-CARE: Group members were asked to discuss what the word care means to them and explore how this concept affects their lives. Patients were asked to choose a picture card that represents the concept of care to them and process this with other group members and facilitator.  Smart, Alfie Alderfer LCSWA 06/25/2014, 1:34 PM

## 2014-06-26 DIAGNOSIS — F319 Bipolar disorder, unspecified: Secondary | ICD-10-CM

## 2014-06-26 DIAGNOSIS — F1420 Cocaine dependence, uncomplicated: Secondary | ICD-10-CM

## 2014-06-26 LAB — LIPID PANEL
CHOL/HDL RATIO: 3.6 ratio
CHOLESTEROL: 155 mg/dL (ref 0–200)
HDL: 43 mg/dL (ref >39)
LDL Cholesterol: 76 mg/dL (ref 0–99)
Triglycerides: 180 mg/dL — ABNORMAL HIGH (ref <150)
VLDL: 36 mg/dL (ref 0–40)

## 2014-06-26 LAB — TSH: TSH: 7.71 u[IU]/mL — ABNORMAL HIGH (ref 0.350–4.500)

## 2014-06-26 LAB — HEMOGLOBIN A1C
HEMOGLOBIN A1C: 5.5 % (ref <5.7)
Mean Plasma Glucose: 111 mg/dL (ref <117)

## 2014-06-26 NOTE — Progress Notes (Signed)
[facility] MD Progress Note  06/26/2014 3:16 PM AILINE HEFFERAN  MRN:  [id number]  Subjective:  Kayleah reports; "I'm feeling a little better today. It just, my balance is off. That is why I'm using wheel chair to get around. I don't feel like hurting myself today. I just feel sad from time to time when I remember my moma. I'm lying in bed because I feel tired"  Diagnosis:   DSM5: Schizophrenia Disorders:  NA Obsessive-Compulsive Disorders:  NA Trauma-Stressor Disorders:  NA Substance/Addictive Disorders:  Alcohol Related Disorder - Severe (303.90) and Cannabis Use Disorder - Severe (304.30), Cocaine use disorder, severe, dependence  Depressive Disorders:  Bipolar I disorder, most recent episode depressed  Total Time spent with patient: 35 minutes  Axis I: Alcohol Related Disorder - Severe (303.90) and Cannabis Use Disorder - Severe (304.30), Cocaine use disorder, Bipolar 1 disorder, most recent episodes, depressed Axis II: Deferred Axis III:  Past Medical History  Diagnosis Date  . HIV (human immunodeficiency virus infection)   . Hypertension   . Depression   . Seizures   . Acute alcoholic hepatitis   . Hepatitis C   . Diverticulitis y-1  . Diverticulosis y-1   Axis IV: other psychosocial or environmental problems and Polysubstance dependence, Chronic medical issues Axis V: 41-50 serious symptoms  ADL's:  Fairly intact  Sleep: Good  Appetite:  Fair  Suicidal Ideation:  Plan:  Denies Intent:  Denies Means:  Denies Homicidal Ideation:  Plan:  Denies Intent:  Denies Means:  Denies AEB (as evidenced by):  Psychiatric Specialty Exam: Physical Exam  Review of Systems  Constitutional: Positive for malaise/fatigue.  HENT: Negative.   Eyes: Negative.   Respiratory: Negative.   Cardiovascular: Negative.   Gastrointestinal: Negative.   Genitourinary: Negative.   Musculoskeletal: Positive for myalgias.  Skin: Negative.   Neurological: Positive for weakness.   Endo/Heme/Allergies: Negative.   Psychiatric/Behavioral: Positive for depression and substance abuse (hx of). Negative for suicidal ideas, hallucinations and memory loss. The patient is nervous/anxious and has insomnia.     Blood pressure 113/69, pulse 63, temperature 98.9 F (37.2 C), temperature source Oral, resp. rate 18, height 5' 7.5" (1.715 m), weight 63.504 kg (140 lb).Body mass index is 21.59 kg/(m^2).  General Appearance: Casual and thin  Eye Contact::  Fair  Speech:  Clear and Coherent  Volume:  Decreased  Mood:  Depressed  Affect:  Flat  Thought Process:  Coherent  Orientation:  Full (Time, Place, and Person)  Thought Content:  Rumination  Suicidal Thoughts:  No  Homicidal Thoughts:  No  Memory:  Immediate;   Fair Recent;   Fair Remote;   Fair  Judgement:  Fair  Insight:  Present  Psychomotor Activity:  Tremor  Concentration:  Fair  Recall:  [employer] of Knowledge:Fair  Language: Fair  Akathisia:  No  Handed:  Right  AIMS (if indicated):     Assets:  Desire for Improvement  Sleep:  Number of Hours: 6.75   Musculoskeletal: Strength & Muscle Tone: within normal limits Gait & Station: unsteady Patient leans: Uses wheel chair to aid mobility  Current Medications: Current Facility-Administered Medications  Medication Dose Route Frequency Provider Last Rate Last Dose  . acetaminophen (TYLENOL) tablet 650 mg  650 mg Oral Q6H PRN Nena Polio, PA-C   650 mg at 06/25/14 0954  . alum & mag hydroxide-simeth (MAALOX/MYLANTA) 200-200-20 MG/5ML suspension 30 mL  30 mL Oral Q4H PRN Nena Polio, PA-C      . amLODipine (  NORVASC) tablet 5 mg  5 mg Oral Daily Nena Polio, PA-C   5 mg at 06/26/14 0845  . haloperidol (HALDOL) tablet 5 mg  5 mg Oral BID Ursula Alert, MD   5 mg at 06/26/14 6387   And  . benztropine (COGENTIN) tablet 0.5 mg  0.5 mg Oral BID Ursula Alert, MD   0.5 mg at 06/26/14 0842  . cholecalciferol (VITAMIN D) tablet 1,000 Units  1,000 Units Oral q  morning - 10a Nena Polio, PA-C   1,000 Units at 06/26/14 1207  . Darunavir Ethanolate (PREZISTA) tablet 800 mg  800 mg Oral Q breakfast Nena Polio, PA-C   800 mg at 06/26/14 5643  . emtricitabine-tenofovir (TRUVADA) 200-300 MG per tablet 1 tablet  1 tablet Oral Daily Nena Polio, PA-C   1 tablet at 06/26/14 0841  . gabapentin (NEURONTIN) capsule 100 mg  100 mg Oral TID Ursula Alert, MD   100 mg at 06/26/14 1207  . hydrochlorothiazide (HYDRODIURIL) tablet 25 mg  25 mg Oral Daily Nena Polio, PA-C   25 mg at 06/26/14 3295  . hydrOXYzine (ATARAX/VISTARIL) tablet 25 mg  25 mg Oral Q6H PRN Ursula Alert, MD   25 mg at 06/25/14 2101  . indomethacin (INDOCIN) capsule 50 mg  50 mg Oral BID WC Nena Polio, PA-C   50 mg at 06/26/14 0841  . lamoTRIgine (LAMICTAL) tablet 25 mg  25 mg Oral Daily Ursula Alert, MD   25 mg at 06/26/14 0841  . levETIRAcetam (KEPPRA) tablet 500 mg  500 mg Oral BID Ursula Alert, MD   500 mg at 06/26/14 0843  . levothyroxine (SYNTHROID, LEVOTHROID) tablet 25 mcg  25 mcg Oral QAC breakfast Nena Polio, PA-C   25 mcg at 06/26/14 0610  . lisinopril (PRINIVIL,ZESTRIL) tablet 10 mg  10 mg Oral Daily Nena Polio, PA-C   10 mg at 06/26/14 1884  . loperamide (IMODIUM) capsule 2-4 mg  2-4 mg Oral PRN Ursula Alert, MD      . LORazepam (ATIVAN) injection 2 mg  2 mg Intramuscular Q4H PRN Ursula Alert, MD      . LORazepam (ATIVAN) tablet 1 mg  1 mg Oral Q6H PRN Ursula Alert, MD   1 mg at 06/25/14 2101  . magnesium hydroxide (MILK OF MAGNESIA) suspension 30 mL  30 mL Oral Daily PRN Nena Polio, PA-C      . multivitamin with minerals tablet 1 tablet  1 tablet Oral Daily Ursula Alert, MD   1 tablet at 06/26/14 0841  . omega-3 acid ethyl esters (LOVAZA) capsule 2 g  2 g Oral BID Nena Polio, PA-C   2 g at 06/26/14 1660  . ondansetron (ZOFRAN-ODT) disintegrating tablet 4 mg  4 mg Oral Q6H PRN Ursula Alert, MD      . ritonavir (NORVIR) tablet 100 mg  100 mg Oral Q  breakfast Nena Polio, PA-C   100 mg at 06/26/14 0841  . thiamine (VITAMIN B-1) tablet 100 mg  100 mg Oral Daily Ursula Alert, MD   100 mg at 06/26/14 0841  . traZODone (DESYREL) tablet 50 mg  50 mg Oral QHS PRN Nena Polio, PA-C   50 mg at 06/25/14 2101    Lab Results:  Results for orders placed or performed during the hospital encounter of 06/24/14 (from the past 48 hour(s))  Valproic acid level     Status: Abnormal   Collection Time: 06/25/14  6:35 AM  Result Value Ref Range   Valproic Acid Lvl <10.0 (L) 50.0 -  100.0 ug/mL    Comment: Performed at [facility]  TSH     Status: Abnormal   Collection Time: 06/26/14  6:30 AM  Result Value Ref Range   TSH 7.710 (H) 0.350 - 4.500 uIU/mL    Comment: Performed at [facility]  Lipid panel     Status: Abnormal   Collection Time: 06/26/14  7:03 AM  Result Value Ref Range   Cholesterol 155 0 - 200 mg/dL   Triglycerides 180 (H) <150 mg/dL   HDL 43 >39 mg/dL   Total CHOL/HDL Ratio 3.6 RATIO   VLDL 36 0 - 40 mg/dL   LDL Cholesterol 76 0 - 99 mg/dL    Comment:        Total Cholesterol/HDL:CHD Risk Coronary Heart Disease Risk Table                     Men   Women  1/2 Average Risk   3.4   3.3  Average Risk       5.0   4.4  2 X Average Risk   9.6   7.1  3 X Average Risk  23.4   11.0        Use the calculated Patient Ratio above and the CHD Risk Table to determine the patient's CHD Risk.        ATP III CLASSIFICATION (LDL):  <100     mg/dL   Optimal  100-129  mg/dL   Near or Above                    Optimal  130-159  mg/dL   Borderline  160-189  mg/dL   High  >190     mg/dL   Very High Performed at [facility]   Hemoglobin A1c     Status: None   Collection Time: 06/26/14  7:03 AM  Result Value Ref Range   Hgb A1c MFr Bld 5.5 <5.7 %    Comment: (NOTE)                                                                       According to the ADA Clinical Practice Recommendations for 2011, when HbA1c is  used as a screening test:  >=6.5%   Diagnostic of Diabetes Mellitus           (if abnormal result is confirmed) 5.7-6.4%   Increased risk of developing Diabetes Mellitus References:Diagnosis and Classification of Diabetes Mellitus,Diabetes YIRS,8546,27(OJJKK 1):S62-S69 and Standards of Medical Care in         Diabetes - 2011,Diabetes Care,2011,34 (Suppl 1):S11-S61.    Mean Plasma Glucose 111 <117 mg/dL    Comment: Performed at [facility]    Physical Findings: AIMS: Facial and Oral Movements Muscles of Facial Expression: None, normal Lips and Perioral Area: None, normal Jaw: None, normal Tongue: None, normal,Extremity Movements Upper (arms, wrists, hands, fingers): None, normal Lower (legs, knees, ankles, toes): None, normal, Trunk Movements Neck, shoulders, hips: None, normal, Overall Severity Severity of abnormal movements (highest score from questions above): None, normal Incapacitation due to abnormal movements: None, normal Patient's awareness of abnormal movements (rate only patient's report): No Awareness, Dental Status Current problems with teeth and/or dentures?:  No Does patient usually wear dentures?: No  CIWA:  CIWA-Ar Total: 3 COWS:  COWS Total Score: 1  Treatment Plan Summary: Daily contact with patient to assess and evaluate symptoms and progress in treatment Medication management  Plan: Continue crisis management and mood stabilization Encourage to participate in group and individual sessions Continue medication management/ and review as needed Address health issues as needed, continue previous home medications for her other medical issues.  Medical Decision Making Problem Points:  Established problem, stable/improving (1), Review of last therapy session (1) and Review of psycho-social stressors (1) Data Points:  Review of medication regiment & side effects (2) Review of new medications or change in dosage (2)  I certify that inpatient services  furnished can reasonably be expected to improve the patient's condition.   Encarnacion Slates, PMHNP-BC 06/26/2014, 3:16 PM I agree with assessment and plan Geralyn Flash A. Sabra Heck, M.D.

## 2014-06-26 NOTE — Progress Notes (Signed)
D: Pt has depressed affect and mood.  She has stayed in her room for the majority of the shift, interacting with staff and peers minimally.  Pt reports "I've been asleep all day 'cause of the medicine."  Pt denies HI, denies hallucinations.  Pt reports passive SI without a plan; verbally contracts for safety.  Pt did not attend evening group.  A: Met with pt 1:1 and provided support and encouraged.  Fluids encouraged.  Safety maintained.   R: Pt is currently resting in her room with her wheelchair next to her bed for use as needed since pt is a high fall risk.  Pt verbally contracts for safety.  Respirations are even and unlabored.  Will continue to monitor and assess for safety.

## 2014-06-26 NOTE — Progress Notes (Signed)
PT Cancellation Note  Patient Details Name: Cheryl Thompson MRN: [id number] DOB: [birth year]   Cancelled Treatment:    Reason Eval/Treat Not Completed: Fatigue/lethargy limiting ability to participate  Chart reviewed and called prior to coming over to see if pt would be able to participate. When I arrived, pt in bed and very lethargic. She could only answer in one word moments before falling back asleep. She did convey that she does want to work with [place] to get stronger and walk again, but stated that she cannot hold her eyes open due to her medications. She apologized.   We will try back either tomorrow or Monday.    Clide Dales 06/26/2014, 2:44 PM  Clide Dales, PT Pager: [phone] 06/26/2014

## 2014-06-26 NOTE — Progress Notes (Signed)
Patient ID: Cheryl Thompson, female   DOB: 1965-12-13, 48 y.o.   MRN: [id number] D: Patient has been sedated today.  She is having difficultly staying awake.  She continues to endorse SI and she contracts for safety on the unit. She rates her depression as a 9; hopelessness as an 8; and anxiety as a 5.  Patient's goal today is to "keep from isolating."  Patient feels her drowsiness is coming from all the medications she is taking.  Patient is ambulating in a wheelchair.  Her affect is flat and blunted; her mood is depressed. A: Continue to monitor medication management and MD orders.  Safety checks completed every 15 minutes per protocol. R: Patient's behavior is appropriate to situation.

## 2014-06-26 NOTE — Progress Notes (Signed)
The focus of this group is to help patients review their daily goal of treatment and discuss progress on daily workbooks. Pt did not attend the evening group. 

## 2014-06-26 NOTE — [facility] Group Notes (Signed)
[place] Group Notes:  Healthy coping skills  Date:  06/26/2014  Time:  10:18 AM  Type of Therapy:  Nurse Education  Participation Level:  Did Not Attend  Participation Quality:  Inattentive  Affect:  Blunted  Cognitive:  Lacking  Insight:  None  Engagement in Group:  None  Modes of Intervention:  Discussion  Summary of Progress/Problems:  Delman Kitten 06/26/2014, 10:18 AM

## 2014-06-27 NOTE — Plan of Care (Signed)
Problem: Alteration in mood & ability to function due to Goal: STG-Patient will attend groups Outcome: Not Progressing Pt did not attend evening group on 06/26/14.

## 2014-06-27 NOTE — Progress Notes (Signed)
Patient ID: Cheryl Thompson, female   DOB: 04-16-1966, 48 y.o.   MRN: [id number] She has been up and to group interacting with peers and staff.HAs been up and down this afternoon saying that she was tired and that her body joints were hurting. She refused offer of a Prn saying that she was already having a hard time staying awake. Self inventory this AM:  Depression 10, hopelessness 8, anxiety 5 c/o pain back, knees and legs. Refused offer of prn pain medication.  Goal to feel less hopeless by reading the Bible and talking to someone. She has been calm and cooperative today.

## 2014-06-27 NOTE — Progress Notes (Signed)
D: Pt has depressed affect and mood.  Pt reports her goal today was "not to be so depressed.  I'm just sleepy."  Pt was falling asleep while talking to writer at beginning of the shift.  Pt denies HI, denies hallucinations.  Pt reports passive SI without a plan.  She verbally contracts for safety.  Pt continues to use wheelchair for mobility. A: Support and encouragement offered.  Safety maintained.  PRN medication administered for pain, see flowsheet. R: Pt verbally contracts for safety.  She is currently resting in her room.  Respirations even and unlabored.  Will continue to monitor and assess for safety.

## 2014-06-27 NOTE — [facility] Group Notes (Signed)
0900 nursing orientation group    The focus of this group is to educate the patient on the purpose and policies of crisis stabilization and provide a format to answer questions about their admission.  The group details unit policies and expectations of patients while admitted.  Pt was an active participant in group and was appropriate in her sharing.

## 2014-06-27 NOTE — Progress Notes (Signed)
Patient ID: Cheryl Thompson, female   DOB: 12-Feb-1966, 48 y.o.   MRN: [id number] [facility] MD Progress Note  06/27/2014 2:28 PM Cheryl Thompson  MRN:  [id number]  Subjective:  Laquisha says she is feeling sleepy, again that is why she is in bed taking a nap. She says her mood is improving, however, is endorsing suicidal ideation off & on (fleeting thoughts). Is a able to contract for safety. She still is using the wheelchair to aid her mobility within the unit and to the cafeteria. Able to walk to bathroom, she adds. She is currently denying any AVH, HI.  Diagnosis:   DSM5: Schizophrenia Disorders:  NA Obsessive-Compulsive Disorders:  NA Trauma-Stressor Disorders:  NA Substance/Addictive Disorders:  Alcohol Related Disorder - Severe (303.90) and Cannabis Use Disorder - Severe (304.30), Cocaine use disorder, severe, dependence  Depressive Disorders:  Bipolar I disorder, most recent episode depressed  Total Time spent with patient: 35 minutes  Axis I: Alcohol Related Disorder - Severe (303.90) and Cannabis Use Disorder - Severe (304.30), Cocaine use disorder, Bipolar 1 disorder, most recent episodes, depressed Axis II: Deferred Axis III:  Past Medical History  Diagnosis Date  . HIV (human immunodeficiency virus infection)   . Hypertension   . Depression   . Seizures   . Acute alcoholic hepatitis   . Hepatitis C   . Diverticulitis y-1  . Diverticulosis y-1   Axis IV: other psychosocial or environmental problems and Polysubstance dependence, Chronic medical issues Axis V: 41-50 serious symptoms  ADL's:  Fairly intact  Sleep: Good  Appetite:  Fair  Suicidal Ideation:  Plan:  Denies Intent:  Denies Means:  Denies Homicidal Ideation:  Plan:  Denies Intent:  Denies Means:  Denies AEB (as evidenced by):  Psychiatric Specialty Exam: Physical Exam  Review of Systems  Constitutional: Positive for malaise/fatigue.  HENT: Negative.   Eyes: Negative.   Respiratory: Negative.    Cardiovascular: Negative.   Gastrointestinal: Negative.   Genitourinary: Negative.   Musculoskeletal: Positive for myalgias.  Skin: Negative.   Neurological: Positive for weakness.  Endo/Heme/Allergies: Negative.   Psychiatric/Behavioral: Positive for depression and substance abuse (hx of). Negative for suicidal ideas, hallucinations and memory loss. The patient is nervous/anxious and has insomnia.     Blood pressure 113/78, pulse 74, temperature 99.9 F (37.7 C), temperature source Oral, resp. rate 18, height 5' 7.5" (1.715 m), weight 63.504 kg (140 lb).Body mass index is 21.59 kg/(m^2).  General Appearance: Casual and thin  Eye Contact::  Fair  Speech:  Clear and Coherent  Volume:  Decreased  Mood:  Depressed  Affect:  Flat  Thought Process:  Coherent  Orientation:  Full (Time, Place, and Person)  Thought Content:  Rumination  Suicidal Thoughts:  No  Homicidal Thoughts:  No  Memory:  Immediate;   Fair Recent;   Fair Remote;   Fair  Judgement:  Fair  Insight:  Present  Psychomotor Activity:  Tremor  Concentration:  Fair  Recall:  [employer] of Knowledge:Fair  Language: Fair  Akathisia:  No  Handed:  Right  AIMS (if indicated):     Assets:  Desire for Improvement  Sleep:  Number of Hours: 6.75   Musculoskeletal: Strength & Muscle Tone: within normal limits Gait & Station: unsteady Patient leans: Uses wheel chair to aid mobility  Current Medications: Current Facility-Administered Medications  Medication Dose Route Frequency Provider Last Rate Last Dose  . acetaminophen (TYLENOL) tablet 650 mg  650 mg Oral Q6H PRN Nena Polio, PA-C  650 mg at 06/25/14 0954  . alum & mag hydroxide-simeth (MAALOX/MYLANTA) 200-200-20 MG/5ML suspension 30 mL  30 mL Oral Q4H PRN Nena Polio, PA-C      . amLODipine (NORVASC) tablet 5 mg  5 mg Oral Daily Nena Polio, PA-C   5 mg at 06/27/14 0800  . haloperidol (HALDOL) tablet 5 mg  5 mg Oral BID Ursula Alert, MD   5 mg at 06/27/14  0800   And  . benztropine (COGENTIN) tablet 0.5 mg  0.5 mg Oral BID Ursula Alert, MD   0.5 mg at 06/27/14 0804  . cholecalciferol (VITAMIN D) tablet 1,000 Units  1,000 Units Oral q morning - 10a Nena Polio, PA-C   1,000 Units at 06/27/14 1257  . Darunavir Ethanolate (PREZISTA) tablet 800 mg  800 mg Oral Q breakfast Nena Polio, PA-C   800 mg at 06/27/14 0800  . emtricitabine-tenofovir (TRUVADA) 200-300 MG per tablet 1 tablet  1 tablet Oral Daily Nena Polio, PA-C   1 tablet at 06/27/14 0800  . gabapentin (NEURONTIN) capsule 100 mg  100 mg Oral TID Ursula Alert, MD   100 mg at 06/27/14 1257  . hydrochlorothiazide (HYDRODIURIL) tablet 25 mg  25 mg Oral Daily Nena Polio, PA-C   25 mg at 06/27/14 0802  . hydrOXYzine (ATARAX/VISTARIL) tablet 25 mg  25 mg Oral Q6H PRN Ursula Alert, MD   25 mg at 06/25/14 2101  . indomethacin (INDOCIN) capsule 50 mg  50 mg Oral BID WC Nena Polio, PA-C   50 mg at 06/27/14 0800  . lamoTRIgine (LAMICTAL) tablet 25 mg  25 mg Oral Daily Saramma Eappen, MD   25 mg at 06/27/14 0800  . levETIRAcetam (KEPPRA) tablet 500 mg  500 mg Oral BID Ursula Alert, MD   500 mg at 06/27/14 0801  . levothyroxine (SYNTHROID, LEVOTHROID) tablet 25 mcg  25 mcg Oral QAC breakfast Nena Polio, PA-C   25 mcg at 06/27/14 4132  . lisinopril (PRINIVIL,ZESTRIL) tablet 10 mg  10 mg Oral Daily Nena Polio, PA-C   10 mg at 06/27/14 0800  . loperamide (IMODIUM) capsule 2-4 mg  2-4 mg Oral PRN Ursula Alert, MD      . LORazepam (ATIVAN) injection 2 mg  2 mg Intramuscular Q4H PRN Ursula Alert, MD      . LORazepam (ATIVAN) tablet 1 mg  1 mg Oral Q6H PRN Ursula Alert, MD   1 mg at 06/25/14 2101  . magnesium hydroxide (MILK OF MAGNESIA) suspension 30 mL  30 mL Oral Daily PRN Nena Polio, PA-C      . multivitamin with minerals tablet 1 tablet  1 tablet Oral Daily Ursula Alert, MD   1 tablet at 06/27/14 0802  . omega-3 acid ethyl esters (LOVAZA) capsule 2 g  2 g Oral BID Nena Polio, PA-C   2 g at 06/27/14 0801  . ondansetron (ZOFRAN-ODT) disintegrating tablet 4 mg  4 mg Oral Q6H PRN Ursula Alert, MD      . ritonavir (NORVIR) tablet 100 mg  100 mg Oral Q breakfast Nena Polio, PA-C   100 mg at 06/27/14 0800  . thiamine (VITAMIN B-1) tablet 100 mg  100 mg Oral Daily Saramma Eappen, MD   100 mg at 06/27/14 0800  . traZODone (DESYREL) tablet 50 mg  50 mg Oral QHS PRN Nena Polio, PA-C   50 mg at 06/25/14 2101    Lab Results:  Results for orders placed or performed during the hospital encounter of 06/24/14 (from the past  48 hour(s))  TSH     Status: Abnormal   Collection Time: 06/26/14  6:30 AM  Result Value Ref Range   TSH 7.710 (H) 0.350 - 4.500 uIU/mL    Comment: Performed at [facility]  Lipid panel     Status: Abnormal   Collection Time: 06/26/14  7:03 AM  Result Value Ref Range   Cholesterol 155 0 - 200 mg/dL   Triglycerides 180 (H) <150 mg/dL   HDL 43 >39 mg/dL   Total CHOL/HDL Ratio 3.6 RATIO   VLDL 36 0 - 40 mg/dL   LDL Cholesterol 76 0 - 99 mg/dL    Comment:        Total Cholesterol/HDL:CHD Risk Coronary Heart Disease Risk Table                     Men   Women  1/2 Average Risk   3.4   3.3  Average Risk       5.0   4.4  2 X Average Risk   9.6   7.1  3 X Average Risk  23.4   11.0        Use the calculated Patient Ratio above and the CHD Risk Table to determine the patient's CHD Risk.        ATP III CLASSIFICATION (LDL):  <100     mg/dL   Optimal  100-129  mg/dL   Near or Above                    Optimal  130-159  mg/dL   Borderline  160-189  mg/dL   High  >190     mg/dL   Very High Performed at [facility]   Hemoglobin A1c     Status: None   Collection Time: 06/26/14  7:03 AM  Result Value Ref Range   Hgb A1c MFr Bld 5.5 <5.7 %    Comment: (NOTE)                                                                       According to the ADA Clinical Practice Recommendations for 2011, when HbA1c is used as a  screening test:  >=6.5%   Diagnostic of Diabetes Mellitus           (if abnormal result is confirmed) 5.7-6.4%   Increased risk of developing Diabetes Mellitus References:Diagnosis and Classification of Diabetes Mellitus,Diabetes WCBJ,6283,15(VVOHY 1):S62-S69 and Standards of Medical Care in         Diabetes - 2011,Diabetes Care,2011,34 (Suppl 1):S11-S61.    Mean Plasma Glucose 111 <117 mg/dL    Comment: Performed at [facility]    Physical Findings: AIMS: Facial and Oral Movements Muscles of Facial Expression: None, normal Lips and Perioral Area: None, normal Jaw: None, normal Tongue: None, normal,Extremity Movements Upper (arms, wrists, hands, fingers): None, normal Lower (legs, knees, ankles, toes): None, normal, Trunk Movements Neck, shoulders, hips: None, normal, Overall Severity Severity of abnormal movements (highest score from questions above): None, normal Incapacitation due to abnormal movements: None, normal Patient's awareness of abnormal movements (rate only patient's report): No Awareness, Dental Status Current problems with teeth and/or dentures?: No Does patient usually wear dentures?: No  CIWA:  CIWA-Ar Total: 3 COWS:  COWS Total Score: 1  Treatment Plan Summary: Daily contact with patient to assess and evaluate symptoms and progress in treatment Medication management  Plan: Continue crisis management and mood stabilization treatments Encourage to participate in group and individual sessions Continue medication management and review as needed Address health issues as needed, continue previous home medications for her other medical issues. No changes made on current treatment plan.  Medical Decision Making Problem Points:  Established problem, stable/improving (1), Review of last therapy session (1) and Review of psycho-social stressors (1) Data Points:  Review of medication regiment & side effects (2) Review of new medications or change in dosage  (2)  I certify that inpatient services furnished can reasonably be expected to improve the patient's condition.   Lindell Spar I, PMHNP-BC 06/27/2014, 2:28 PM

## 2014-06-28 MED ORDER — LAMOTRIGINE 25 MG PO TABS
25.0000 mg | ORAL_TABLET | Freq: Two times a day (BID) | ORAL | Status: DC
  Administered 2014-06-28 – 2014-06-29 (×2): 25 mg via ORAL
  Filled 2014-06-28 (×6): qty 1

## 2014-06-28 NOTE — Progress Notes (Signed)
Adult Psychoeducational Group Note  Date:  06/28/2014 Time:  12:00 PM  Group Topic/Focus:  Developing a Wellness Toolbox:   The focus of this group is to help patients develop a "wellness toolbox" with skills and strategies to promote recovery upon discharge.  Participation Level:  Minimal  Participation Quality:  Appropriate  Affect:  Flat  Cognitive:  Oriented  Insight: Limited  Engagement in Group:  Limited  Modes of Intervention:  Discussion and Education  Additional Comments:  Pt spent the entire time in the group room during the group's session but participated minimally.  Tressa Maldonado E 06/28/2014, 12:00 PM
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
## 2014-06-28 NOTE — Plan of Care (Signed)
Problem: Ineffective individual coping Goal: STG: Patient will remain free from self harm Outcome: Progressing Patient has not engaged in self harm and denies SI this morning. (Had endorsed passive SI previously)  Problem: Diagnosis: Increased Risk For Suicide Attempt Goal: STG-Patient Will Comply With Medication Regime Outcome: Progressing Patient has been med compliant

## 2014-06-28 NOTE — Progress Notes (Signed)
Patient ID: Cheryl Thompson, female   DOB: [birth year], 48 y.o.   MRN: [id number] [facility] MD Progress Note  06/28/2014 2:07 PM Cheryl Thompson  MRN:  [id number]  Subjective:  Lalla says she is feeling better ,she feels her medications are helping and that she is improving.  Objective; Patient seen and chart reviewed.Patient today appears to be more pleasant ,reports better control of her mood symptoms. Patient per staff had an episode this AM ,when she was very panicky and anxious. Patient reports that that was because it is Monday and this was the day that her mother had passed away few weeks ago. Patient still is trying to cope with her mother's death on 07/13/23 nd.  Patient denies SI/HI/AH/VH. Patient is compliant on medications,denies side effects.    Diagnosis:   DSM5: Primary Psychiatric Diagnosis: Bipolar disorder,type I ,most recent episode depressed ,severe with psychosis   Secondary Psychiatric Diagnosis: Bereavement  Stimulant use disorder (cocaine) severe  Cannabis use disorder,severe Alcohol use disorder,severe   Non Psychiatric Diagnosis: HIV Seizure disorder Hepatitis C Arthritis Gout  Total Time spent with patient: 35 minutes   Past Medical History  Diagnosis Date  . HIV (human immunodeficiency virus infection)   . Hypertension   . Depression   . Seizures   . Acute alcoholic hepatitis   . Hepatitis C   . Diverticulitis y-1  . Diverticulosis y-1     ADL's:  Fairly intact  Sleep: Good  Appetite:  Fair  Psychiatric Specialty Exam: Physical Exam  Review of Systems  Constitutional: Positive for malaise/fatigue.  HENT: Negative.   Eyes: Negative.   Respiratory: Negative.   Cardiovascular: Negative.   Gastrointestinal: Negative.   Genitourinary: Negative.   Musculoskeletal: Positive for myalgias.  Skin: Negative.   Neurological: Positive for weakness.  Endo/Heme/Allergies: Negative.   Psychiatric/Behavioral: Positive for depression and substance  abuse (hx of). Negative for suicidal ideas, hallucinations and memory loss. The patient is nervous/anxious and has insomnia.     Blood pressure 109/65, pulse 67, temperature 98.2 F (36.8 C), temperature source Oral, resp. rate 20, height 5' 7.5" (1.715 m), weight 63.504 kg (140 lb).Body mass index is 21.59 kg/(m^2).  General Appearance: Casual and thin  Eye Contact::  Fair  Speech:  Clear and Coherent  Volume:  Decreased  Mood:  Depressed improving,had panic attack this AM  Affect:  Congruent  Thought Process:  Coherent  Orientation:  Full (Time, Place, and Person)  Thought Content:  Rumination  Suicidal Thoughts:  No  Homicidal Thoughts:  No  Memory:  Immediate;   Fair Recent;   Fair Remote;   Fair  Judgement:  Fair  Insight:  Present  Psychomotor Activity:  Restlessness IMPROVING  Concentration:  Fair  Recall:  [employer] of Knowledge:Fair  Language: Fair  Akathisia:  No  Handed:  Right  AIMS (if indicated):     Assets:  Desire for Improvement  Sleep:  Number of Hours: 6.75   Musculoskeletal: Strength & Muscle Tone: within normal limits Gait & Station: unsteady Patient leans: Uses wheel chair to aid mobility  Current Medications: Current Facility-Administered Medications  Medication Dose Route Frequency Provider Last Rate Last Dose  . acetaminophen (TYLENOL) tablet 650 mg  650 mg Oral Q6H PRN Nena Polio, PA-C   650 mg at 06/27/14 1938  . alum & mag hydroxide-simeth (MAALOX/MYLANTA) 200-200-20 MG/5ML suspension 30 mL  30 mL Oral Q4H PRN Nena Polio, PA-C      . amLODipine (NORVASC) tablet 5 mg  5 mg Oral Daily Nena Polio, PA-C   5 mg at 06/28/14 2694  . haloperidol (HALDOL) tablet 5 mg  5 mg Oral BID Ursula Alert, MD   5 mg at 06/28/14 8546   And  . benztropine (COGENTIN) tablet 0.5 mg  0.5 mg Oral BID Ursula Alert, MD   0.5 mg at 06/28/14 2703  . cholecalciferol (VITAMIN D) tablet 1,000 Units  1,000 Units Oral q morning - 10a Nena Polio, PA-C   1,000  Units at 06/28/14 1051  . Darunavir Ethanolate (PREZISTA) tablet 800 mg  800 mg Oral Q breakfast Nena Polio, PA-C   800 mg at 06/28/14 5009  . emtricitabine-tenofovir (TRUVADA) 200-300 MG per tablet 1 tablet  1 tablet Oral Daily Nena Polio, PA-C   1 tablet at 06/28/14 3818  . gabapentin (NEURONTIN) capsule 100 mg  100 mg Oral TID Ursula Alert, MD   100 mg at 06/28/14 1215  . hydrochlorothiazide (HYDRODIURIL) tablet 25 mg  25 mg Oral Daily Nena Polio, PA-C   25 mg at 06/28/14 2993  . hydrOXYzine (ATARAX/VISTARIL) tablet 25 mg  25 mg Oral Q6H PRN Ursula Alert, MD   25 mg at 06/28/14 0713  . indomethacin (INDOCIN) capsule 50 mg  50 mg Oral BID WC Nena Polio, PA-C   50 mg at 06/28/14 7169  . lamoTRIgine (LAMICTAL) tablet 25 mg  25 mg Oral BID Ursula Alert, MD      . levETIRAcetam (KEPPRA) tablet 500 mg  500 mg Oral BID Ursula Alert, MD   500 mg at 06/28/14 0828  . levothyroxine (SYNTHROID, LEVOTHROID) tablet 25 mcg  25 mcg Oral QAC breakfast Nena Polio, PA-C   25 mcg at 06/28/14 6789  . lisinopril (PRINIVIL,ZESTRIL) tablet 10 mg  10 mg Oral Daily Nena Polio, PA-C   10 mg at 06/28/14 3810  . loperamide (IMODIUM) capsule 2-4 mg  2-4 mg Oral PRN Ursula Alert, MD      . LORazepam (ATIVAN) injection 2 mg  2 mg Intramuscular Q4H PRN Ursula Alert, MD      . LORazepam (ATIVAN) tablet 1 mg  1 mg Oral Q6H PRN Ursula Alert, MD   1 mg at 06/25/14 2101  . magnesium hydroxide (MILK OF MAGNESIA) suspension 30 mL  30 mL Oral Daily PRN Nena Polio, PA-C      . multivitamin with minerals tablet 1 tablet  1 tablet Oral Daily Ursula Alert, MD   1 tablet at 06/28/14 0828  . omega-3 acid ethyl esters (LOVAZA) capsule 2 g  2 g Oral BID Nena Polio, PA-C   2 g at 06/28/14 0827  . ondansetron (ZOFRAN-ODT) disintegrating tablet 4 mg  4 mg Oral Q6H PRN Ursula Alert, MD      . ritonavir (NORVIR) tablet 100 mg  100 mg Oral Q breakfast Nena Polio, PA-C   100 mg at 06/28/14 1751  .  thiamine (VITAMIN B-1) tablet 100 mg  100 mg Oral Daily Ursula Alert, MD   100 mg at 06/28/14 0828  . traZODone (DESYREL) tablet 50 mg  50 mg Oral QHS PRN Nena Polio, PA-C   50 mg at 06/25/14 2101    Lab Results:  No results found for this or any previous visit (from the past 48 hour(s)).  Physical Findings: AIMS: Facial and Oral Movements Muscles of Facial Expression: None, normal Lips and Perioral Area: None, normal Jaw: None, normal Tongue: None, normal,Extremity Movements Upper (arms, wrists, hands, fingers): None, normal Lower (legs, knees, ankles, toes): None, normal, Trunk  Movements Neck, shoulders, hips: None, normal, Overall Severity Severity of abnormal movements (highest score from questions above): None, normal Incapacitation due to abnormal movements: None, normal Patient's awareness of abnormal movements (rate only patient's report): No Awareness, Dental Status Current problems with teeth and/or dentures?: No Does patient usually wear dentures?: No  CIWA:  CIWA-Ar Total: 2 COWS:  COWS Total Score: 1  Treatment Plan Summary: Daily contact with patient to assess and evaluate symptoms and progress in treatment Medication management  Plan:   Continue Haldol 5 mg po bid with Cogentin for EPS. Will increase Lamictal to  25 mg po bid for mood lability. Trazodone 50 mg for sleep. Will continue Gabapentin 100 mg po tid for anxiety sx as well as for seizure.Continue Keppra for seizure (per neurology consult).   CIWA /ATIVAN prn for alcohol withdrawal. Will restart home medications where needed. Chaplain consult for grief.  Will continue to monitor vitals ,medication compliance and treatment side effects while patient is here.  Will monitor for medical issues as well as call consult as needed.  Reviewed labs ,will order as needed.  CSW will start working on disposition.  Patient to participate in therapeutic milieu . Patient to attend AA as well as other  substance abuse groups as needed.   Medical Decision Making Problem Points:  Established problem, stable/improving (1), Review of last therapy session (1) and Review of psycho-social stressors (1) Data Points:  Review of medication regiment & side effects (2) Review of new medications or change in dosage (2)  I certify that inpatient services furnished can reasonably be expected to improve the patient's condition.   Leyda Vanderwerf, MD 06/28/2014, 2:07 PM

## 2014-06-28 NOTE — Plan of Care (Signed)
Problem: Alteration in mood Goal: STG-Patient reports thoughts of self-harm to staff Outcome: Progressing Pt reported passive SI to writer this shift.  She denied having a plan and she verbally contracted for safety.

## 2014-06-28 NOTE — Progress Notes (Signed)
The focus of this group is to help patients review their daily goal of treatment and discuss progress on daily workbooks. Pt attended the evening group session and responded to all discussion prompts from the [place]. Pt shared that today was a hard day since it had been one week since her Mother was laid to rest. Pt told the group she found it easier to isolate in her room, but pushed herself today to get out and attend groups. "I'm glad I did." Pt told the Writer that she was no longer in denial about having a mental illness or needing medication. "I know now. The pills they gave me this morning helped a lot." Pt was praised for making the difficult effort to be amongst people today and work on getting better. Cheryl Thompson was receptive to this and mentioned that "God will help see me through this. I'll be okay." Pt's affect was appropriate.

## 2014-06-28 NOTE — BHH Group Notes (Signed)
[facility] LCSW Aftercare Discharge Planning Group Note   06/28/2014 11:08 AM  Participation Quality:  Appropriate   Mood/Affect:  Depressed  Depression Rating:  5  Anxiety Rating:  5  Thoughts of Suicide:  No Will you contract for safety?   NA  Current AVH:  No  Plan for Discharge/Comments:  Pt reports that "I had an episode this morning but worked through it." She stated that it is the 1 week anniversary of when she buried her mother and this got her depressed this morning. Pt plans to d/c on Thursday/verified with MD. Pt to follow-up at [facility] and will return home with her sister at d/c. Pt provided with comprehensive AA list, Mental [employer] info, and Hospice Grief Counseling information per her request.   Transportation Means: sister or bus   Supports: siblings   Smart, Research officer, trade union

## 2014-06-28 NOTE — Progress Notes (Signed)
Patient has been up and visible on unit. Had difficult time in cafeteria this morning stating,"It's a Monday and I buried my mom on a Monday. They are just going to be hard from now on." Did report relief from vistaril prn given by her night nurse. "I know I need these meds. Even before my mother's death I needed them." patient praised for insight, given support and reassurance. Medicated per orders. Per PT recommend, patient transitioned to a rolling walker from wheelchair. Reminded of fall precautions which are in place. Patient does have some swelling to her knees from her arthritis. Denies other concerns. Patient able to deny SI this morning. No HI/AVH. Pt is safe. Jamie Kato

## 2014-06-28 NOTE — Evaluation (Addendum)
Physical Therapy Evaluation Patient Details Name: Cheryl Thompson MRN: [id number] DOB: 09-Nov-1965 Today's Date: 06/28/2014   History of Present Illness  Cheryl Thompson is an 48 y.o.  AA female with hx of depression, HIV, gout, OA, hepatitis C, substance abuse, and bipolar disorder presented to to [facility] by way of [facility]. Patient reports feeling suicidal since the death of her mother 07/07/2014.  Patient reports overdosing on alcohol, cocaine, and  since her mother's death.  Clinical Impression  **Pt admitted with above diagnosis. Pt currently with functional limitations due to the deficits listed below (see PT Problem List). ** Pt will benefit from skilled PT to increase their independence and safety with mobility to allow discharge to the venue listed below.    Pt has been using WC on unit. At baseline she uses a cane or crutches but has fallen 20x in the past year. With trial of rolling walker today pt did very well. She walked 140' without loss of balance. RW recommended for use on unit and for home. Will follow for balance training.  *    Follow Up Recommendations No PT follow up    Equipment Recommendations  Rolling walker with 5" wheels (recommended tub transfer bench, pt aware this is out of pocket item)    Recommendations for Other Services       Precautions / Restrictions Precautions Precautions: Fall Precaution Comments: pt reports having 20 falls in past year      Mobility  Bed Mobility Overal bed mobility: Independent                Transfers Overall transfer level: Modified independent Equipment used: Rolling walker (2 wheeled)             General transfer comment: pushed up from [facility]  Ambulation/Gait Ambulation/Gait assistance: Modified independent (Device/Increase time) Ambulation Distance (Feet): 140 Feet Assistive device: Rolling walker (2 wheeled) Gait Pattern/deviations: WFL(Within Functional Limits)   Gait velocity interpretation: Below  normal speed for age/gender General Gait Details: steady with RW, pt reports liking the RW  Stairs            Wheelchair Mobility    Modified Rankin (Stroke Patients Only)       Balance Overall balance assessment: History of Falls                                           Pertinent Vitals/Pain Pain Assessment: 0-10 Pain Score: 10-Worst pain ever Pain Location: B knees Pain Descriptors / Indicators: Sore Pain Intervention(s): Limited activity within patient's tolerance;Monitored during session;Premedicated before session    Home Living Family/patient expects to be discharged to:: Private residence Living Arrangements: Other relatives Available Help at Discharge: Available PRN/intermittently Type of Home: House       Home Layout: One level Home Equipment: Cane - single point;Crutches;Grab bars - tub/shower      Prior Function Level of Independence: Independent with assistive device(s)         Comments: used cane or crutches as needed 2* B knee pain from arthritis/gout     Hand Dominance        Extremity/Trunk Assessment   Upper Extremity Assessment: Overall WFL for tasks assessed           Lower Extremity Assessment: Overall WFL for tasks assessed      Cervical / Trunk Assessment: Normal  Communication  Communication: No difficulties  Cognition Arousal/Alertness: Awake/alert Behavior During Therapy: WFL for tasks assessed/performed Overall Cognitive Status: Within Functional Limits for tasks assessed                      General Comments      Exercises        Assessment/Plan    PT Assessment Patient needs continued PT services  PT Diagnosis Difficulty walking;Acute pain   PT Problem List Decreased balance;Decreased mobility  PT Treatment Interventions Gait training;Therapeutic activities;Balance training;Patient/family education   PT Goals (Current goals can be found in the Care Plan section) Acute  Rehab PT Goals Patient Stated Goal: to be able to walk on unit, reduce falls PT Goal Formulation: With patient Time For Goal Achievement: 07/12/14 Potential to Achieve Goals: Good    Frequency Min 2X/week   Barriers to discharge        Co-evaluation               End of Session   Activity Tolerance: Patient tolerated treatment well Patient left: in chair Nurse Communication: Mobility status    Functional Assessment Tool Used: clinical judgement Functional Limitation: Mobility: Walking and moving around Mobility: Walking and Moving Around Current Status (S3419): At least 1 percent but less than 20 percent impaired, limited or restricted Mobility: Walking and Moving Around Goal Status [phone]): 0 percent impaired, limited or restricted    Time: 1355-1412 PT Time Calculation (min) (ACUTE ONLY): 17 min   Charges:   PT Evaluation $Initial PT Evaluation Tier I: 1 Procedure PT Treatments $Gait Training: 8-22 mins   PT G Codes:   Functional Assessment Tool Used: clinical judgement Functional Limitation: Mobility: Walking and moving around    Bernice, [employer] 06/28/2014, 2:24 PM [phone]

## 2014-06-29 DIAGNOSIS — F329 Major depressive disorder, single episode, unspecified: Secondary | ICD-10-CM

## 2014-06-29 DIAGNOSIS — G40909 Epilepsy, unspecified, not intractable, without status epilepticus: Secondary | ICD-10-CM | POA: Insufficient documentation

## 2014-06-29 MED ORDER — HALOPERIDOL 5 MG PO TABS: 5.0000 mg | ORAL_TABLET | Freq: Two times a day (BID) | ORAL | Status: DC

## 2014-06-29 MED ORDER — AMLODIPINE BESYLATE 5 MG PO TABS: 5.0000 mg | ORAL_TABLET | Freq: Every day | ORAL | Status: DC

## 2014-06-29 MED ORDER — TRAZODONE HCL 50 MG PO TABS: 50.0000 mg | ORAL_TABLET | Freq: Every evening | ORAL | Status: DC | PRN

## 2014-06-29 MED ORDER — LISINOPRIL 10 MG PO TABS: 10.0000 mg | ORAL_TABLET | Freq: Every day | ORAL | Status: DC

## 2014-06-29 MED ORDER — LAMOTRIGINE 25 MG PO TABS: 25.0000 mg | ORAL_TABLET | Freq: Two times a day (BID) | ORAL | Status: DC

## 2014-06-29 MED ORDER — LEVOTHYROXINE SODIUM 25 MCG PO TABS: 25.0000 ug | ORAL_TABLET | Freq: Every day | ORAL | Status: DC

## 2014-06-29 MED ORDER — HYDROCHLOROTHIAZIDE 25 MG PO TABS: 25.0000 mg | ORAL_TABLET | Freq: Every day | ORAL | Status: DC

## 2014-06-29 MED ORDER — OMEGA-3-ACID ETHYL ESTERS 1 G PO CAPS: 2.0000 g | ORAL_CAPSULE | Freq: Two times a day (BID) | ORAL | Status: DC

## 2014-06-29 MED ORDER — DARUNAVIR ETHANOLATE 800 MG PO TABS: 800.0000 mg | ORAL_TABLET | Freq: Every day | ORAL | Status: DC

## 2014-06-29 MED ORDER — GABAPENTIN 100 MG PO CAPS: 100.0000 mg | ORAL_CAPSULE | Freq: Three times a day (TID) | ORAL | Status: DC

## 2014-06-29 MED ORDER — LEVETIRACETAM 500 MG PO TABS: 500.0000 mg | ORAL_TABLET | Freq: Two times a day (BID) | ORAL | Status: DC

## 2014-06-29 MED ORDER — VITAMIN D 1000 UNITS PO TABS: 1000.0000 [IU] | ORAL_TABLET | Freq: Every morning | ORAL | Status: DC

## 2014-06-29 MED ORDER — INDOMETHACIN 25 MG PO CAPS: 50.0000 mg | ORAL_CAPSULE | Freq: Two times a day (BID) | ORAL | Status: DC

## 2014-06-29 MED ORDER — BENZTROPINE MESYLATE 0.5 MG PO TABS: 0.5000 mg | ORAL_TABLET | Freq: Two times a day (BID) | ORAL | Status: DC

## 2014-06-29 MED ORDER — EMTRICITABINE-TENOFOVIR DF 200-300 MG PO TABS: 1.0000 | ORAL_TABLET | Freq: Every day | ORAL | Status: DC

## 2014-06-29 MED ORDER — RITONAVIR 100 MG PO TABS: 100.0000 mg | ORAL_TABLET | Freq: Every day | ORAL | Status: DC

## 2014-06-29 NOTE — [facility] Suicide Risk Assessment (Signed)
   Demographic Factors:  Low socioeconomic status  Total Time spent with patient: 45 minutes  Psychiatric Specialty Exam: Physical Exam  ROS  Blood pressure 117/79, pulse 79, temperature 98.2 F (36.8 C), temperature source Oral, resp. rate 20, height 5' 7.5" (1.715 m), weight 63.504 kg (140 lb).Body mass index is 21.59 kg/(m^2).  General Appearance: Casual  Eye Contact::  Good  Speech:  Clear and Coherent  Volume:  Normal  Mood:  Euthymic  Affect:  Appropriate  Thought Process:  Coherent  Orientation:  Full (Time, Place, and Person)  Thought Content:  WDL  Suicidal Thoughts:  No  Homicidal Thoughts:  No  Memory:  Immediate;   Fair Recent;   Fair Remote;   Fair  Judgement:  Fair  Insight:  Fair  Psychomotor Activity:  Normal  Concentration:  Fair  Recall:  [employer] [place]  Language: Fair  Akathisia:  No  Handed:  Right  AIMS (if indicated):     Assets:  Communication Skills Desire for Improvement  Sleep:  Number of Hours: 6.75    Musculoskeletal: Strength & Muscle Tone: within normal limits Gait & Station: normal Patient leans: N/A   Mental Status Per Nursing Assessment::   On Admission:  Suicidal ideation indicated by patient, Thoughts of violence towards others  Current Mental Status by Physician: patient denies SI/HI/AH/VH  Loss Factors: Decline in physical health  Historical Factors: Prior suicide attempts and Impulsivity  Risk Reduction Factors:   Living with another person, especially a relative, Positive social support and Positive therapeutic relationship  Continued Clinical Symptoms:  Previous Psychiatric Diagnoses and Treatments Medical Diagnoses and Treatments/Surgeries  Cognitive Features That Contribute To Risk:  Polarized thinking    Suicide Risk:  Minimal: No identifiable suicidal ideation.  Patients presenting with no risk factors but with morbid ruminations; may be classified as minimal risk based on the severity of  the depressive symptoms  Discharge Diagnoses:  Primary Psychiatric Diagnosis: Bipolar disorder,type I ,most recent episode depressed ,severe with psychosis (RESOLVING)   Secondary Psychiatric Diagnosis: Bereavement  Stimulant use disorder (cocaine) severe  Cannabis use disorder,severe Alcohol use disorder,severe   Non Psychiatric Diagnosis: HIV Seizure disorder Hepatitis C Arthritis Gout  Past Medical History  Diagnosis Date  . HIV (human immunodeficiency virus infection)   . Hypertension   . Depression   . Seizures   . Acute alcoholic hepatitis   . Hepatitis C   . Diverticulitis y-1  . Diverticulosis y-1    Plan Of Care/Follow-up recommendations:  Activity:  NO RESTRICTIONS Diet:  REGULAR  Is patient on multiple antipsychotic therapies at discharge:  No   Has Patient had three or more failed trials of antipsychotic monotherapy by history:  No  Recommended Plan for Multiple Antipsychotic Therapies: NA    Remi Lopata MD 06/29/2014, 8:59 AM

## 2014-06-29 NOTE — Tx Team (Signed)
Interdisciplinary Treatment Plan Update (Adult)   Date: 06/29/2014   Time Reviewed: 9:00AM Progress in Treatment:  Attending groups: Yes  Participating in groups: Yes  Taking medication as prescribed: Yes  Tolerating medication: Yes  Family/Significant othe contact made: SPE completed with pt. She refused to consent to family contact.   Patient understands diagnosis: Yes, AEB seeking treatment for SI/HI toward family due to paranoia (symtpoms of psychosis), substance abuse (alcohol and cocaine), depression/mood instability, and for medication stabilization.  Discussing patient identified problems/goals with staff: Yes  Medical problems stabilized or resolved: Yes  Denies suicidal/homicidal ideation: Passive SI/Able to contract for safety on unit.  Patient has not harmed self or Others: Yes  New problem(s) identified: pt high fall risk and is currently in wheelchair.  Discharge Plan or Barriers: [facility] for med management. Appt made with [facility] per pt request. She was also provided with AA list, Mental health association info, and ADS handout per her request. Pt provided with Hospice Grief counseling information.   Additional comments: Cheryl Thompson is an 48 y.o. female with hx of depression. She presents to [facility] by way of [facility]. Patient reports feeling suicidal since the death of her mother 2014-07-04. Sts, "I am not taking my mothers death very well and I've tried to kill myself every since she died". Patient reports overdosing on alcohol, cocaine, and thc since her mothers death. She is binging on all substances. Sts that she has a hx of substance abuse and has remained sober for 2 yrs until using again 2014-07-04. Patient has a hx of suicide attempts between the age of 59-9 yrs old (drinking bleach). Patient also homicidal toward family members. Sts that she plans to stab them as soon as they fall asleep. Patient wants to kill family because, "They are against me".  Patient reports feeling paranoid for a unknown reason. Sts, "I know my family is not really against me but I can't stop thinking they are for some reason". She reports no hx of homicidal thoughts. She denies AVH's. No hx of inpatient mental health hospitalizations. Patient does not have a therapist or psychiatrist.   12/15: PT has been pleasant, cooperative, and participating in groups during her stay at [facility]. She is scheduled for d/c today, reporting low depression/anxiety and no SI/HI/AVH.  Reason for Continuation of Hospitalization: none  Estimated length of stay: d/c today  For review of initial/current patient goals, please see plan of care.  Attendees:  Patient:    Family:    Physician: Dr. Shea Evans, MD 06/29/2014   Nursing:  06/29/2014   Clinical Social Worker [place], [place]  06/29/2014   Other: Ripley Fraise LCSW; Edwyna Shell LCSW 06/29/2014   Other: Gerline Legacy Nurse CM 06/29/2014   Other: Hilda Lias, Community Care Coordinator  06/29/2014   Other:    Scribe for Treatment Team:  Nira Conn Smart LCSWA 06/29/2014 9:00AM

## 2014-06-29 NOTE — Progress Notes (Signed)
[facility] Adult Case Management Discharge Plan :  Will you be returning to the same living situation after discharge: Yes,  home At discharge, do you have transportation home?:Yes,  friend coming after lunch Do you have the ability to pay for your medications:Yes,  mental health  Release of information consent forms completed and submitted to Medical Records by CSW.  Patient to Follow up at: Follow-up Information    Follow up with [facility].   Why:  Walk in between 8am-9am Monday through Friday for hospital follow-up, medication management, and assessment for therapy services.    Contact information:   [address], [place] [address] Phone: [phone] Fax: [phone]      Follow up with [facility] for Infectious Disease On 07/20/2014.   Why:  Appt with Dr. [name] at 10:30AM for check-up/medication management.    Contact information:   [address] [place], [place] [address] Phone: [phone]       Patient denies SI/HI:   Yes,  during group/self report.     Safety Planning and Suicide Prevention discussed:  Yes,  SPE completed with pt, as she refused to consent to family contact. SPI pamphlet provided to pt and she was encouraged to share information with support network, ask questions, and talk about any concerns relating to SPE.   Smart, Myley Bahner LCSWA  06/29/2014, 10:25 AM

## 2014-06-29 NOTE — Progress Notes (Signed)
D: Pt denies SI/HI/AVH.  Pt stated she wants to go to meetings when she leaves here and not drink anymore.   A: Pt was offered support and encouragement. Pt was given scheduled medications. Pt was encourage to attend groups. Q 15 minute checks were done for safety.   R:Pt attends groups and interacts well with peers and staff. Pt is taking medication. Pt receptive to treatment and safety maintained on unit.

## 2014-06-29 NOTE — [facility] Suicide Risk Assessment (Signed)
[facility] INPATIENT:  Family/Significant Other Suicide Prevention Education  Suicide Prevention Education:  Patient Refusal for Family/Significant Other Suicide Prevention Education: The patient Cheryl Thompson has refused to provide written consent for family/significant other to be provided Family/Significant Other Suicide Prevention Education during admission and/or prior to discharge.  Physician notified.  SPE completed with pt. SPI pamphlet provided to pt and she was encouraged to share information with support network, ask questions, and talk about any concerns regarding SPE.   Smart, Joy Reiger LCSWA  06/29/2014, 10:19 AM

## 2014-06-29 NOTE — Discharge Summary (Signed)
Physician Discharge Summary Note  Patient:  Cheryl Thompson is an 48 y.o., female MRN:  [id number] DOB:  March 30, 1966 Patient phone:  [phone] (home)  Patient address:   [place] [address],  Total Time spent with patient: Jerelyn Charles than 30 minutes  Date of Admission:  06/24/2014 Date of Discharge: 06/29/14  Reason for Admission:  Mood stabilization treatments  Discharge Diagnoses: Principal Problem:   Bereavement Active Problems:   Depression   Bipolar I disorder, most recent episode depressed   Cocaine use disorder, severe, dependence   Cannabis use disorder, moderate, dependence   Alcohol use disorder, severe, dependence   Seizure disorder   Psychiatric Specialty Exam: Physical Exam  Psychiatric: Her speech is normal and behavior is normal. Judgment and thought content normal. Her mood appears not anxious. Her affect is not angry, not blunt, not labile and not inappropriate. Cognition and memory are normal. She does not exhibit a depressed mood.    Review of Systems  Constitutional: Negative.   HENT: Negative.   Eyes: Negative.   Respiratory: Negative.   Cardiovascular: Negative.   Gastrointestinal: Negative.   Genitourinary: Negative.   Musculoskeletal: Negative.   Skin: Negative.   Neurological: Negative.   Endo/Heme/Allergies: Negative.   Psychiatric/Behavioral: Positive for depression (Stable) and substance abuse (Hx of). Negative for suicidal ideas, hallucinations and memory loss. The patient has insomnia (Stable). The patient is not nervous/anxious.     Blood pressure 117/79, pulse 79, temperature 98.2 F (36.8 C), temperature source Oral, resp. rate 20, height 5' 7.5" (1.715 m), weight 63.504 kg (140 lb).Body mass index is 21.59 kg/(m^2).         Past Psychiatric History: Diagnosis: Depression ,alcohol use disorder  Hospitalizations: [facility] -2012  Outpatient Care: Denies  Substance Abuse Care: [facility] in the past  Self-Mutilation:  Denies  Suicidal Attempts: yes ,several times ,6 times  Violent Behaviors: Denies   Musculoskeletal: Strength & Muscle Tone: within normal limits Gait & Station: normal, unsteady Patient leans: N/A  DSM5: Schizophrenia Disorders: NA Obsessive-Compulsive Disorders:  NA Trauma-Stressor Disorders:  NA Substance/Addictive Disorders:  Cocaine use disorder, Cannabis use disorder, Alcohol use disorder Depressive Disorders:  Major Depressive Disorder - Moderate (296.22)  Axis Diagnosis:   AXIS I:  Major depressive disorder,  Cocaine use disorder, Cannabis use disorder, Alcohol use disorder AXIS II:  Deferred AXIS III:   Past Medical History  Diagnosis Date  . HIV (human immunodeficiency virus infection)   . Hypertension   . Depression   . Seizures   . Acute alcoholic hepatitis   . Hepatitis C   . Diverticulitis y-1  . Diverticulosis y-1   AXIS IV:  other psychosocial or environmental problems and recent loss of a loved one AXIS V:  63  Level of Care:  OP  Hospital Course:  Cheryl Thompson is an 48 y.o. AA female with hx of depression versus bipolar disorder presented to to [facility] by way of [facility]. Patient reports feeling suicidal since the death of her mother 2014/07/02. Patient reports overdosing on alcohol, cocaine, and since her mother's death. Patient also stated during the initial evaluation that she has a hx of substance abuse and has remained sober for 2 yrs until using again 07-02-2014.Patient has a hx of suicide attempts between the age of 21-10 yrs old (drinking bleach). Patient also homicidal toward family members. Endorsed thoughts about Stabbing them as soon as they fall asleep.  Cheryl Thompson was admitted to the hospital with her  UDS test reports positive for cocaine & THC. This is as a result of her potential overdose due to worsening depression associated with the recent death of her mother. She was in need of mood stabilization treatments. Cheryl Thompson was medicated  and discharged on Neurontin 100 mg three times daily for agitation/pain management, haldol 5 mg twice daily for mood control, Cogentin 0.5 mg twice for prevention of EPS, Lamictal 25 mg twice daily for mood stabilization and Trazodone 50 mg Q bedtime for sleep. She was also resumed on her pertinent home medications for her other pre-existing medical issues that she presented. She tolerated her treatment regimen without any significant adverse effects reported.   Besides the mood stabilization treatments received, Cheryl Thompson was also enrolled and participated in the group counseling sessions being offered and held within this unit. However, she did not attend most of the group sessions as she spend a lot of time in bed sleeping. She complained of lower extremity weakness and used wheel chair to aid her mobility within the unit. She also ambulated with a cane from her room to the bathroom as well.   Cheryl Thompson symptoms responded well to her treatment regimen. This is evidenced by her reports of improved mood and absence of suicidal/homicidal ideations. She is currently being discharged to continue treatments on outpatient basis at [facility] and at the infectious disease clinic for her medical issues. She received all the necessary information required to make these appointments without problems. Upon discharge, she adamantly denies any SIHI, AVH, delusional thoughts and or paranoia. She received a 14 days worth, supply samples of her [facility] discharge medications. She left [facility] with all belongings in no distress. Transportation per friend.   Consults:  psychiatry  Significant Diagnostic Studies:  labs: CBC with diff, CMP, UDS, toxicology tests, U/A, results reviewed, stable  Discharge Vitals:   Blood pressure 117/79, pulse 79, temperature 98.2 F (36.8 C), temperature source Oral, resp. rate 20, height 5' 7.5" (1.715 m), weight 63.504 kg (140 lb). Body mass index is 21.59 kg/(m^2). Lab Results:   No results found for  this or any previous visit (from the past 72 hour(s)).  Physical Findings: AIMS: Facial and Oral Movements Muscles of Facial Expression: None, normal Lips and Perioral Area: None, normal Jaw: None, normal Tongue: None, normal,Extremity Movements Upper (arms, wrists, hands, fingers): None, normal Lower (legs, knees, ankles, toes): None, normal, Trunk Movements Neck, shoulders, hips: None, normal, Overall Severity Severity of abnormal movements (highest score from questions above): None, normal Incapacitation due to abnormal movements: None, normal Patient's awareness of abnormal movements (rate only patient's report): No Awareness, Dental Status Current problems with teeth and/or dentures?: No Does patient usually wear dentures?: No  CIWA:  CIWA-Ar Total: 2 COWS:  COWS Total Score: 1  Psychiatric Specialty Exam: See Psychiatric Specialty Exam and Suicide Risk Assessment completed by Attending Physician prior to discharge.  Discharge destination:  Home  Is patient on multiple antipsychotic therapies at discharge:  No   Has Patient had three or more failed trials of antipsychotic monotherapy by history:  No  Recommended Plan for Multiple Antipsychotic Therapies: NA    Medication List    STOP taking these medications        divalproex 250 MG 24 hr tablet  Commonly known as:  DEPAKOTE ER     oxyCODONE-acetaminophen 5-325 MG per tablet  Commonly known as:  PERCOCET/ROXICET     promethazine 25 MG tablet  Commonly known as:  PHENERGAN     silver sulfADIAZINE  1 % cream  Commonly known as:  SILVADENE     vitamin B-1 250 MG tablet      TAKE these medications      Indication   amLODipine 5 MG tablet  Commonly known as:  NORVASC  Take 1 tablet (5 mg total) by mouth daily. For high blood pressure   Indication:  High Blood Pressure     benztropine 0.5 MG tablet  Commonly known as:  COGENTIN  Take 1 tablet (0.5 mg total) by mouth 2 (two) times daily. For prevention of drug  induced tremors   Indication:  Extrapyramidal Reaction caused by Medications     cholecalciferol 1000 UNITS tablet  Commonly known as:  VITAMIN D  Take 1 tablet (1,000 Units total) by mouth every morning. For bone health   Indication:  Bone health     Darunavir Ethanolate 800 MG tablet  Commonly known as:  PREZISTA  Take 1 tablet (800 mg total) by mouth daily. For HIV infection   Indication:  HIV Disease     emtricitabine-tenofovir 200-300 MG per tablet  Commonly known as:  TRUVADA  Take 1 tablet by mouth daily. For HIV infection   Indication:  HIV Disease     gabapentin 100 MG capsule  Commonly known as:  NEURONTIN  Take 1 capsule (100 mg total) by mouth 3 (three) times daily. For agitation/pain   Indication:  Agitation, Pain     haloperidol 5 MG tablet  Commonly known as:  HALDOL  Take 1 tablet (5 mg total) by mouth 2 (two) times daily. For mood control   Indication:  Mood control     hydrochlorothiazide 25 MG tablet  Commonly known as:  HYDRODIURIL  Take 1 tablet (25 mg total) by mouth daily. For high blood pressure   Indication:  High Blood Pressure     indomethacin 25 MG capsule  Commonly known as:  INDOCIN  Take 2 capsules (50 mg total) by mouth 2 (two) times daily with a meal. For pain gouty arthritis   Indication:  Acute Joint Inflammation in Gout     lamoTRIgine 25 MG tablet  Commonly known as:  LAMICTAL  Take 1 tablet (25 mg total) by mouth 2 (two) times daily. For mood stabilization   Indication:  Mood stabilization     levETIRAcetam 500 MG tablet  Commonly known as:  KEPPRA  Take 1 tablet (500 mg total) by mouth 2 (two) times daily. For seizure activities   Indication:  Seizure activities     levothyroxine 25 MCG tablet  Commonly known as:  SYNTHROID, LEVOTHROID  Take 1 tablet (25 mcg total) by mouth daily before breakfast. (Do not take wit other medications): For low thyroid function   Indication:  Underactive Thyroid     lisinopril 10 MG tablet   Commonly known as:  PRINIVIL,ZESTRIL  Take 1 tablet (10 mg total) by mouth daily. For high blood pressure   Indication:  High Blood Pressure     omega-3 acid ethyl esters 1 G capsule  Commonly known as:  LOVAZA  Take 2 capsules (2 g total) by mouth 2 (two) times daily. For high cholesterol/fats   Indication:  High Amount of Triglycerides in the Blood     ritonavir 100 MG Tabs tablet  Commonly known as:  NORVIR  Take 1 tablet (100 mg total) by mouth daily. For HIV infection   Indication:  HIV Disease     traZODone 50 MG tablet  Commonly known as:  DESYREL  Take 1 tablet (50 mg total) by mouth at bedtime as needed for sleep.   Indication:  Trouble Sleeping       Follow-up Information    Follow up with [facility].   Why:  Walk in between 8am-9am Monday through Friday for hospital follow-up, medication management, and assessment for therapy services.    Contact information:   [address], [place] [address] Phone: [phone] Fax: [phone]      Follow up with [facility] for Infectious Disease On 07/20/2014.   Why:  Appt with Dr. [name] at 10:30AM for check-up/medication management.    Contact information:   [address] [place], [place] [address] Phone: [phone] Fax: [phone]      Follow-up recommendations: Activity:  As tolerated Diet: As recommended by your primary care doctor. Keep all scheduled follow-up appointments as recommended.    Comments: Take all your medications as prescribed by your mental healthcare provider. Report any adverse effects and or reactions from your medicines to your outpatient provider promptly. Patient is instructed and cautioned to not engage in alcohol and or illegal drug use while on prescription medicines. In the event of worsening symptoms, patient is instructed to call the crisis hotline, 911 and or go to the nearest ED for appropriate evaluation and treatment of symptoms. Follow-up with your primary care  provider for your other medical issues, concerns and or health care needs.  Total Discharge Time:  Greater than 30 minutes.  Signed: Lindell Spar I, PMHNP-BC 06/29/2014, 2:36 PM

## 2014-06-29 NOTE — Progress Notes (Signed)
Patient ID: Cheryl Thompson, female   DOB: February 26, 1966, 48 y.o.   MRN: [id number]  Pt discharged home with Fara Olden. Pt was stable and smiling. All papers and prescriptions were given and valuables returned. Verbal understanding expressed. Denies SI/HI and A/VH. Pt expressed gratitude towards [facility] staff. Pt will return to [facility] to pick up her walker at a later date.

## 2014-06-29 NOTE — Progress Notes (Signed)
Patient up and ambulating with front wheel walker. Less unsteady though still complains of some knee swelling which she said is not unusual for her due to her arthritis. She is vocal that she would very much like to leave today. "I only have transportation today. Not tomorrow or Thursday. I have a car but I did not drive here. I was in no shape to be driving." Encouraged patient to speak with MD. Liana Gerold reassurance. Medicated per orders without difficulty. Fall precautions reviewed with patient and in place. Patient denies SI/HI/AVH and remains safe. Jamie Kato

## 2014-06-29 NOTE — [facility] Group Notes (Signed)
[facility] Group Notes:  (Nursing/MHT/Case Management/Adjunct)  Date:  06/29/2014  Time:  9:00am  Type of Therapy:  Nurse Education  Participation Level:  Active  Participation Quality:  Appropriate  Affect:  Appropriate  Cognitive:  Appropriate  Insight:  Good  Engagement in Group:  Engaged  Modes of Intervention:  Discussion, Education and Support  Summary of Progress/Problems: Patient displayed active listening and was engaged regarding recovery and ways to remain after discharge.   Jamie Kato 06/29/2014, 5:44 PM

## 2014-07-01 NOTE — Progress Notes (Signed)
Patient Discharge Instructions:  After Visit Summary (AVS):   Faxed to:  07/01/14 Discharge Summary Note:   Faxed to:  07/01/14 Psychiatric Admission Assessment Note:   Faxed to:  07/01/14 Suicide Risk Assessment - Discharge Assessment:   Faxed to:  07/01/14 Faxed/Sent to the Next Level Care provider:  07/01/14 Next Level Care Provider Has Access to the EMR, 07/01/14 faxed to [facility] @ [phone] Records provided to [facility] for Infectious Disease via [facility]/Epic access.  Patsey Berthold, 07/01/2014, 3:55 PM

## 2014-07-06 ENCOUNTER — Other Ambulatory Visit: Payer: Self-pay

## 2014-07-13 ENCOUNTER — Emergency Department (HOSPITAL_COMMUNITY)
Admission: EM | Admit: 2014-07-13 | Discharge: 2014-07-15 | Disposition: A | Discharge status: Other Acute Inpatient Hospital | Payer: Federal, State, Local not specified - Other | Attending: Emergency Medicine | Admitting: Emergency Medicine

## 2014-07-13 ENCOUNTER — Encounter (HOSPITAL_COMMUNITY): Payer: Self-pay | Admitting: Emergency Medicine

## 2014-07-13 DIAGNOSIS — F1410 Cocaine abuse, uncomplicated: Secondary | ICD-10-CM | POA: Insufficient documentation

## 2014-07-13 DIAGNOSIS — Z79899 Other long term (current) drug therapy: Secondary | ICD-10-CM | POA: Insufficient documentation

## 2014-07-13 DIAGNOSIS — Z791 Long term (current) use of non-steroidal anti-inflammatories (NSAID): Secondary | ICD-10-CM | POA: Insufficient documentation

## 2014-07-13 DIAGNOSIS — G40909 Epilepsy, unspecified, not intractable, without status epilepticus: Secondary | ICD-10-CM | POA: Insufficient documentation

## 2014-07-13 DIAGNOSIS — F1210 Cannabis abuse, uncomplicated: Secondary | ICD-10-CM | POA: Insufficient documentation

## 2014-07-13 DIAGNOSIS — Z8719 Personal history of other diseases of the digestive system: Secondary | ICD-10-CM | POA: Insufficient documentation

## 2014-07-13 DIAGNOSIS — R45851 Suicidal ideations: Secondary | ICD-10-CM

## 2014-07-13 DIAGNOSIS — I10 Essential (primary) hypertension: Secondary | ICD-10-CM | POA: Insufficient documentation

## 2014-07-13 DIAGNOSIS — Z880 Allergy status to penicillin: Secondary | ICD-10-CM | POA: Insufficient documentation

## 2014-07-13 DIAGNOSIS — F3163 Bipolar disorder, current episode mixed, severe, without psychotic features: Secondary | ICD-10-CM

## 2014-07-13 DIAGNOSIS — F419 Anxiety disorder, unspecified: Secondary | ICD-10-CM | POA: Insufficient documentation

## 2014-07-13 DIAGNOSIS — F319 Bipolar disorder, unspecified: Secondary | ICD-10-CM | POA: Insufficient documentation

## 2014-07-13 DIAGNOSIS — Z634 Disappearance and death of family member: Secondary | ICD-10-CM

## 2014-07-13 DIAGNOSIS — Z8619 Personal history of other infectious and parasitic diseases: Secondary | ICD-10-CM | POA: Insufficient documentation

## 2014-07-13 DIAGNOSIS — Z720 Tobacco use: Secondary | ICD-10-CM | POA: Insufficient documentation

## 2014-07-13 DIAGNOSIS — Z21 Asymptomatic human immunodeficiency virus [HIV] infection status: Secondary | ICD-10-CM | POA: Insufficient documentation

## 2014-07-13 DIAGNOSIS — F3130 Bipolar disorder, current episode depressed, mild or moderate severity, unspecified: Secondary | ICD-10-CM

## 2014-07-13 LAB — RAPID URINE DRUG SCREEN, HOSP PERFORMED
Amphetamines: NOT DETECTED
Barbiturates: NOT DETECTED
Benzodiazepines: NOT DETECTED
Cocaine: POSITIVE — AB
Opiates: NOT DETECTED
TETRAHYDROCANNABINOL: POSITIVE — AB

## 2014-07-13 LAB — COMPREHENSIVE METABOLIC PANEL
ALT: 47 U/L — ABNORMAL HIGH (ref 0–35)
AST: 90 U/L — ABNORMAL HIGH (ref 0–37)
Albumin: 3.9 g/dL (ref 3.5–5.2)
Alkaline Phosphatase: 68 U/L (ref 39–117)
Anion gap: 5 (ref 5–15)
BUN: 15 mg/dL (ref 6–23)
CHLORIDE: 106 meq/L (ref 96–112)
CO2: 28 mmol/L (ref 19–32)
CREATININE: 1.18 mg/dL — AB (ref 0.50–1.10)
Calcium: 9 mg/dL (ref 8.4–10.5)
GFR calc non Af Amer: 53 mL/min — ABNORMAL LOW (ref >90)
GFR, EST AFRICAN AMERICAN: 62 mL/min — AB (ref >90)
GLUCOSE: 95 mg/dL (ref 70–99)
Potassium: 3.6 mmol/L (ref 3.5–5.1)
Sodium: 139 mmol/L (ref 135–145)
Total Bilirubin: 0.3 mg/dL (ref 0.3–1.2)
Total Protein: 8.3 g/dL (ref 6.0–8.3)

## 2014-07-13 LAB — CBC
HEMATOCRIT: 33.7 % — AB (ref 36.0–46.0)
Hemoglobin: 10.8 g/dL — ABNORMAL LOW (ref 12.0–15.0)
MCH: 32.7 pg (ref 26.0–34.0)
MCHC: 32 g/dL (ref 30.0–36.0)
MCV: 102.1 fL — AB (ref 78.0–100.0)
Platelets: 146 10*3/uL — ABNORMAL LOW (ref 150–400)
RBC: 3.3 MIL/uL — ABNORMAL LOW (ref 3.87–5.11)
RDW: 13.4 % (ref 11.5–15.5)
WBC: 6.1 10*3/uL (ref 4.0–10.5)

## 2014-07-13 LAB — ACETAMINOPHEN LEVEL

## 2014-07-13 LAB — ETHANOL: Alcohol, Ethyl (B): <5 mg/dL (ref 0–9)

## 2014-07-13 LAB — SALICYLATE LEVEL: Salicylate Lvl: <4 mg/dL (ref 2.8–20.0)

## 2014-07-13 MED ORDER — ACETAMINOPHEN 325 MG PO TABS
650.0000 mg | ORAL_TABLET | ORAL | Status: DC | PRN
  Administered 2014-07-13 – 2014-07-15 (×6): 650 mg via ORAL
  Filled 2014-07-13 (×6): qty 2

## 2014-07-13 MED ORDER — EMTRICITABINE-TENOFOVIR DF 200-300 MG PO TABS
1.0000 | ORAL_TABLET | Freq: Every day | ORAL | Status: DC
  Administered 2014-07-13 – 2014-07-15 (×3): 1 via ORAL
  Filled 2014-07-13 (×3): qty 1

## 2014-07-13 MED ORDER — LORAZEPAM 1 MG PO TABS
1.0000 mg | ORAL_TABLET | Freq: Three times a day (TID) | ORAL | Status: DC | PRN
  Administered 2014-07-14: 1 mg via ORAL
  Filled 2014-07-13: qty 1

## 2014-07-13 MED ORDER — AMLODIPINE BESYLATE 5 MG PO TABS
5.0000 mg | ORAL_TABLET | Freq: Every day | ORAL | Status: DC
Start: 2014-07-13 — End: 2014-07-15
  Administered 2014-07-13 – 2014-07-15 (×3): 5 mg via ORAL
  Filled 2014-07-13 (×4): qty 1

## 2014-07-13 MED ORDER — DARUNAVIR ETHANOLATE 800 MG PO TABS
800.0000 mg | ORAL_TABLET | Freq: Every day | ORAL | Status: DC
  Administered 2014-07-13 – 2014-07-15 (×3): 800 mg via ORAL
  Filled 2014-07-13 (×4): qty 1

## 2014-07-13 MED ORDER — LISINOPRIL 10 MG PO TABS
10.0000 mg | ORAL_TABLET | Freq: Every day | ORAL | Status: DC
  Administered 2014-07-13 – 2014-07-15 (×3): 10 mg via ORAL
  Filled 2014-07-13 (×4): qty 1

## 2014-07-13 MED ORDER — LEVOTHYROXINE SODIUM 25 MCG PO TABS
25.0000 ug | ORAL_TABLET | Freq: Every day | ORAL | Status: DC
  Administered 2014-07-14 – 2014-07-15 (×2): 25 ug via ORAL
  Filled 2014-07-13 (×3): qty 1

## 2014-07-13 MED ORDER — HYDROCHLOROTHIAZIDE 25 MG PO TABS
25.0000 mg | ORAL_TABLET | Freq: Every day | ORAL | Status: DC
  Administered 2014-07-13 – 2014-07-15 (×3): 25 mg via ORAL
  Filled 2014-07-13 (×3): qty 1

## 2014-07-13 MED ORDER — LEVETIRACETAM 500 MG PO TABS
500.0000 mg | ORAL_TABLET | Freq: Two times a day (BID) | ORAL | Status: DC
  Administered 2014-07-13 – 2014-07-15 (×5): 500 mg via ORAL
  Filled 2014-07-13 (×6): qty 1

## 2014-07-13 MED ORDER — RITONAVIR 100 MG PO TABS
100.0000 mg | ORAL_TABLET | Freq: Every day | ORAL | Status: DC
  Administered 2014-07-13 – 2014-07-15 (×3): 100 mg via ORAL
  Filled 2014-07-13 (×4): qty 1

## 2014-07-13 MED ORDER — TRAZODONE HCL 50 MG PO TABS
50.0000 mg | ORAL_TABLET | Freq: Every evening | ORAL | Status: DC | PRN
  Administered 2014-07-14: 50 mg via ORAL
  Filled 2014-07-13 (×2): qty 1

## 2014-07-13 MED ORDER — BENZTROPINE MESYLATE 1 MG PO TABS
0.5000 mg | ORAL_TABLET | Freq: Two times a day (BID) | ORAL | Status: DC
  Administered 2014-07-13 – 2014-07-14 (×3): 0.5 mg via ORAL
  Administered 2014-07-14: 1 mg via ORAL
  Administered 2014-07-15: 0.5 mg via ORAL
  Filled 2014-07-13 (×6): qty 1

## 2014-07-13 NOTE — BH Assessment (Addendum)
Attempted to reach TTS Erasmo Downer to notify her about pt consult. Left message with ED nurse Randall Hiss for [facility] to contact TTS regarding TTS consult.  Shaune Pollack, MS, Portal Assessment Counselor

## 2014-07-13 NOTE — ED Notes (Signed)
Per patient states she took edomethacin andquor a couple of days ago-mother died recently-increased stress-using substances to self-medicate

## 2014-07-13 NOTE — ED Provider Notes (Signed)
CSN: [id number]     Arrival date & time 07/13/14  1046 History   First MD Initiated Contact with Patient 07/13/14 1048     Chief Complaint  Patient presents with  . Suicidal     (Consider location/radiation/quality/duration/timing/severity/associated sxs/prior Treatment) The history is provided by the patient.  pt w hx bipolar disorder, c/o worsening depression and thoughts of suicide in the past week.   States was hospitalized at [facility] for same, w d/c 12/15.  States at d/c was feeling better, but that w holidays, and thinking more about loss of mother on her birthdate, she has been feeling worse. +thoughts of self harm/overdose. States drank heavily 2 days ago, and took 3-4 indocin, and wished she wouldn't wake up then.  Denies any other overdose or attempt at self harm.  States is not taking her normal medications, and did not follow up at [facility] as had been instructed. States physical health at baseline, no recent acute illness. Denies fever or chills. Normal appetite. Also indicates has been abusing cocaine and thc.      Past Medical History  Diagnosis Date  . HIV (human immunodeficiency virus infection)   . Hypertension   . Depression   . Seizures   . Acute alcoholic hepatitis   . Hepatitis C   . Diverticulitis y-1  . Diverticulosis y-1   Past Surgical History  Procedure Laterality Date  . Total abdominal hysterectomy w/ bilateral salpingoophorectomy  2006  . 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 Abdominal hysterectomy     Family History  Problem Relation Age of Onset  . Drug abuse Mother   . Diabetes Sister   . Diabetes Maternal Aunt   . Hypertension Maternal Aunt   . Hyperlipidemia Maternal Aunt   . Stroke Maternal Grandmother   . Hypertension Maternal Grandmother   . Diabetes Maternal Grandmother   . Stroke Maternal Grandfather   . Alcohol abuse Maternal Grandfather    History  Substance Use Topics  . Smoking status:  Current Every Day Smoker -- 0.10 packs/day    Types: Cigarettes  . Smokeless tobacco: Never Used  . Alcohol Use: 0.0 oz/week     Comment: pt reports she has been drinking "everything" since 06/16/14.  Over 9 drinks a day   OB History    No data available     Review of Systems  Constitutional: Negative for fever and chills.  HENT: Negative for sore throat.   Eyes: Negative for redness.  Respiratory: Negative for shortness of breath.   Cardiovascular: Negative for chest pain.  Gastrointestinal: Negative for vomiting, abdominal pain and diarrhea.  Genitourinary: Negative for flank pain.  Musculoskeletal: Negative for back pain and neck pain.  Skin: Negative for rash.  Neurological: Negative for headaches.  Hematological: Does not bruise/bleed easily.  Psychiatric/Behavioral: Positive for suicidal ideas and dysphoric mood. The patient is nervous/anxious.       Allergies  Penicillins and Naproxen  Home Medications   Prior to Admission medications   Medication Sig Start Date End Date Taking? Authorizing Provider  amLODipine (NORVASC) 5 MG tablet Take 1 tablet (5 mg total) by mouth daily. For high blood pressure 06/29/14   Encarnacion Slates, NP  benztropine (COGENTIN) 0.5 MG tablet Take 1 tablet (0.5 mg total) by mouth 2 (two) times daily. For prevention of drug induced tremors 06/29/14   Encarnacion Slates, NP  cholecalciferol (VITAMIN D) 1000 UNITS tablet Take 1 tablet (  1,000 Units total) by mouth every morning. For bone health 06/29/14   Encarnacion Slates, NP  Darunavir Ethanolate (PREZISTA) 800 MG tablet Take 1 tablet (800 mg total) by mouth daily. For HIV infection 06/29/14   Encarnacion Slates, NP  emtricitabine-tenofovir (TRUVADA) 200-300 MG per tablet Take 1 tablet by mouth daily. For HIV infection 06/29/14   Encarnacion Slates, NP  gabapentin (NEURONTIN) 100 MG capsule Take 1 capsule (100 mg total) by mouth 3 (three) times daily. For agitation/pain 06/29/14   Encarnacion Slates, NP  haloperidol (HALDOL)  5 MG tablet Take 1 tablet (5 mg total) by mouth 2 (two) times daily. For mood control 06/29/14   Encarnacion Slates, NP  hydrochlorothiazide (HYDRODIURIL) 25 MG tablet Take 1 tablet (25 mg total) by mouth daily. For high blood pressure 06/29/14   Encarnacion Slates, NP  indomethacin (INDOCIN) 25 MG capsule Take 2 capsules (50 mg total) by mouth 2 (two) times daily with a meal. For pain gouty arthritis 06/29/14   Encarnacion Slates, NP  lamoTRIgine (LAMICTAL) 25 MG tablet Take 1 tablet (25 mg total) by mouth 2 (two) times daily. For mood stabilization 06/29/14   Encarnacion Slates, NP  levETIRAcetam (KEPPRA) 500 MG tablet Take 1 tablet (500 mg total) by mouth 2 (two) times daily. For seizure activities 06/29/14   Encarnacion Slates, NP  levothyroxine (SYNTHROID, LEVOTHROID) 25 MCG tablet Take 1 tablet (25 mcg total) by mouth daily before breakfast. (Do not take wit other medications): For low thyroid function 06/29/14   Encarnacion Slates, NP  lisinopril (PRINIVIL,ZESTRIL) 10 MG tablet Take 1 tablet (10 mg total) by mouth daily. For high blood pressure 06/29/14   Encarnacion Slates, NP  omega-3 acid ethyl esters (LOVAZA) 1 G capsule Take 2 capsules (2 g total) by mouth 2 (two) times daily. For high cholesterol/fats 06/29/14   Encarnacion Slates, NP  ritonavir (NORVIR) 100 MG TABS tablet Take 1 tablet (100 mg total) by mouth daily. For HIV infection 06/29/14   Encarnacion Slates, NP  traZODone (DESYREL) 50 MG tablet Take 1 tablet (50 mg total) by mouth at bedtime as needed for sleep. 06/29/14   Encarnacion Slates, NP   BP 154/97 mmHg  Pulse 68  Temp(Src) 98.3 F (36.8 C) (Oral)  Resp 16  Ht 5\' 9"  (1.753 m)  Wt 130 lb (58.968 kg)  BMI 19.19 kg/m2  SpO2 100% Physical Exam  Constitutional: She is oriented to person, place, and time. She appears well-developed and well-nourished. No distress.  HENT:  Mouth/Throat: Oropharynx is clear and moist.  Eyes: Conjunctivae are normal. No scleral icterus.  Neck: Neck supple. No tracheal deviation present.   Cardiovascular: Normal rate, regular rhythm, normal heart sounds and intact distal pulses.   Pulmonary/Chest: Effort normal and breath sounds normal. No respiratory distress.  Abdominal: Soft. Normal appearance and bowel sounds are normal. She exhibits no distension. There is no tenderness.  Musculoskeletal: She exhibits no edema or tenderness.  Neurological: She is alert and oriented to person, place, and time.  Steady gait. No tremor or shakes.   Skin: Skin is warm and dry. No rash noted. She is not diaphoretic.  Psychiatric:  Anxious, tearful. +SI.   Nursing note and vitals reviewed.   ED Course  Procedures (including critical care time) Labs Review  Results for orders placed or performed during the hospital encounter of 07/13/14  CBC  Result Value Ref Range   WBC 6.1 4.0 - 10.5  K/uL   RBC 3.30 (L) 3.87 - 5.11 MIL/uL   Hemoglobin 10.8 (L) 12.0 - 15.0 g/dL   HCT 33.7 (L) 36.0 - 46.0 %   MCV 102.1 (H) 78.0 - 100.0 fL   MCH 32.7 26.0 - 34.0 pg   MCHC 32.0 30.0 - 36.0 g/dL   RDW 13.4 11.5 - 15.5 %   Platelets 146 (L) 150 - 400 K/uL  Comprehensive metabolic panel  Result Value Ref Range   Sodium 139 135 - 145 mmol/L   Potassium 3.6 3.5 - 5.1 mmol/L   Chloride 106 96 - 112 mEq/L   CO2 28 19 - 32 mmol/L   Glucose, Bld 95 70 - 99 mg/dL   BUN 15 6 - 23 mg/dL   Creatinine, Ser 1.18 (H) 0.50 - 1.10 mg/dL   Calcium 9.0 8.4 - 10.5 mg/dL   Total Protein 8.3 6.0 - 8.3 g/dL   Albumin 3.9 3.5 - 5.2 g/dL   AST 90 (H) 0 - 37 U/L   ALT 47 (H) 0 - 35 U/L   Alkaline Phosphatase 68 39 - 117 U/L   Total Bilirubin 0.3 0.3 - 1.2 mg/dL   GFR calc non Af Amer 53 (L) >90 mL/min   GFR calc Af Amer 62 (L) >90 mL/min   Anion gap 5 5 - 15  Urine rapid drug screen (hosp performed)  Result Value Ref Range   Opiates NONE DETECTED NONE DETECTED   Cocaine POSITIVE (A) NONE DETECTED   Benzodiazepines NONE DETECTED NONE DETECTED   Amphetamines NONE DETECTED NONE DETECTED   Tetrahydrocannabinol  POSITIVE (A) NONE DETECTED   Barbiturates NONE DETECTED NONE DETECTED  Ethanol  Result Value Ref Range   Alcohol, Ethyl (B) <5 0 - 9 mg/dL  Acetaminophen level  Result Value Ref Range   Acetaminophen (Tylenol), Serum <10.0 (L) 10 - 30 ug/mL  Salicylate level  Result Value Ref Range   Salicylate Lvl <4.9 2.8 - 20.0 mg/dL     MDM  Labs.  Reviewed nursing notes and prior charts for additional history.   Psych holding orders.  Discussed w psych team, disposition per psych team.  Recheck, pt content, awake, and alert, awaiting psych team eval.  dispo per psych team.       Mirna Mires, MD 07/13/14 1217
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
## 2014-07-13 NOTE — BH Assessment (Signed)
Notified TTS Erasmo Downer of consult.  Shaune Pollack, MS, [place] Assessment Counselor

## 2014-07-13 NOTE — BHH Counselor (Signed)
Charmaine Downs NP recommends inpatient. TTS to seek placement  Bedelia Person, M.S., LPCA, [facility] Licensed Professional Counselor Associate  Triage Specialist  [facility]  Therapeutic Triage Services Phone: [phone] Fax: [phone]
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
## 2014-07-13 NOTE — ED Notes (Signed)
Pt AAO x 3, resting on stretcher, no complaints voiced, will continue to monitor for safety, pt watching TV at present.

## 2014-07-14 DIAGNOSIS — F1410 Cocaine abuse, uncomplicated: Secondary | ICD-10-CM | POA: Diagnosis present

## 2014-07-14 DIAGNOSIS — R45850 Homicidal ideations: Secondary | ICD-10-CM

## 2014-07-14 DIAGNOSIS — F3130 Bipolar disorder, current episode depressed, mild or moderate severity, unspecified: Secondary | ICD-10-CM

## 2014-07-14 DIAGNOSIS — R45851 Suicidal ideations: Secondary | ICD-10-CM

## 2014-07-14 MED ORDER — OLANZAPINE 10 MG PO TABS: 10.0000 mg | ORAL_TABLET | Freq: Once | ORAL | Status: DC
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
## 2014-07-14 NOTE — BH Assessment (Addendum)
[place] Assessment Progress Note  Pt has been referred to the following facilities with results as noted:  *[facility]: at [facility]: at [place]: pt has been declined due to need for long term placement *[facility]: at [place]: pt has been declined due to need for long term placement *[facility]: referral faxed, decision pending  Jalene Mullet, [place] Triage Specialist 07/14/2014 @ 15:55

## 2014-07-14 NOTE — ED Notes (Signed)
Patient anxious, tearful. Reports SI, HI. States feelings of anxiety and depression at 10/10. Reports poor sleep since Thanksgiving. Reports decreased appetite. States "When I was on the phone with my aunt, I became very upset and I could see myself fighting my brother and sister. I feel desperate. I'm ready to go to prison. Patient does not want to be in the hospital. C/o back and abdominal pain. C/o of chest hurting "when taking deep breaths". Reports constipation.  Encouragement offered. Tylenol given.  Q 15 safety checks continue.

## 2014-07-14 NOTE — BHH Counselor (Signed)
TTS Counselor faxed referrals to the following facilities in effort to obtain inpt placement:   [place], [facility] Triage Specialist

## 2014-07-15 ENCOUNTER — Encounter (HOSPITAL_COMMUNITY): Payer: Self-pay

## 2014-07-15 ENCOUNTER — Inpatient Hospital Stay (HOSPITAL_COMMUNITY): Admission: AD | Admit: 2014-07-15 | Payer: Self-pay | Source: Intra-hospital

## 2014-07-15 ENCOUNTER — Inpatient Hospital Stay (HOSPITAL_COMMUNITY)
Admission: AD | Admit: 2014-07-15 | Discharge: 2014-07-20 | DRG: 885 | Disposition: A | Discharge status: Home / Self Care | Payer: Federal, State, Local not specified - Other | Source: Intra-hospital | Attending: Psychiatry | Admitting: Psychiatry

## 2014-07-15 DIAGNOSIS — F1220 Cannabis dependence, uncomplicated: Secondary | ICD-10-CM | POA: Diagnosis present

## 2014-07-15 DIAGNOSIS — F3163 Bipolar disorder, current episode mixed, severe, without psychotic features: Secondary | ICD-10-CM | POA: Insufficient documentation

## 2014-07-15 DIAGNOSIS — G4700 Insomnia, unspecified: Secondary | ICD-10-CM | POA: Diagnosis present

## 2014-07-15 DIAGNOSIS — Z62810 Personal history of physical and sexual abuse in childhood: Secondary | ICD-10-CM | POA: Diagnosis present

## 2014-07-15 DIAGNOSIS — E039 Hypothyroidism, unspecified: Secondary | ICD-10-CM | POA: Diagnosis present

## 2014-07-15 DIAGNOSIS — F17210 Nicotine dependence, cigarettes, uncomplicated: Secondary | ICD-10-CM | POA: Diagnosis present

## 2014-07-15 DIAGNOSIS — Z823 Family history of stroke: Secondary | ICD-10-CM | POA: Diagnosis not present

## 2014-07-15 DIAGNOSIS — Z9114 Patient's other noncompliance with medication regimen: Secondary | ICD-10-CM | POA: Diagnosis present

## 2014-07-15 DIAGNOSIS — F315 Bipolar disorder, current episode depressed, severe, with psychotic features: Secondary | ICD-10-CM | POA: Diagnosis present

## 2014-07-15 DIAGNOSIS — B1920 Unspecified viral hepatitis C without hepatic coma: Secondary | ICD-10-CM | POA: Diagnosis present

## 2014-07-15 DIAGNOSIS — F1420 Cocaine dependence, uncomplicated: Secondary | ICD-10-CM | POA: Diagnosis present

## 2014-07-15 DIAGNOSIS — Z8249 Family history of ischemic heart disease and other diseases of the circulatory system: Secondary | ICD-10-CM

## 2014-07-15 DIAGNOSIS — F1020 Alcohol dependence, uncomplicated: Secondary | ICD-10-CM | POA: Diagnosis present

## 2014-07-15 DIAGNOSIS — K219 Gastro-esophageal reflux disease without esophagitis: Secondary | ICD-10-CM | POA: Diagnosis present

## 2014-07-15 DIAGNOSIS — B20 Human immunodeficiency virus [HIV] disease: Secondary | ICD-10-CM | POA: Diagnosis present

## 2014-07-15 DIAGNOSIS — F419 Anxiety disorder, unspecified: Secondary | ICD-10-CM | POA: Diagnosis present

## 2014-07-15 DIAGNOSIS — Z91410 Personal history of adult physical and sexual abuse: Secondary | ICD-10-CM | POA: Diagnosis not present

## 2014-07-15 DIAGNOSIS — Z833 Family history of diabetes mellitus: Secondary | ICD-10-CM | POA: Diagnosis not present

## 2014-07-15 DIAGNOSIS — I10 Essential (primary) hypertension: Secondary | ICD-10-CM | POA: Diagnosis present

## 2014-07-15 DIAGNOSIS — Z634 Disappearance and death of family member: Secondary | ICD-10-CM

## 2014-07-15 DIAGNOSIS — M1990 Unspecified osteoarthritis, unspecified site: Secondary | ICD-10-CM | POA: Diagnosis present

## 2014-07-15 DIAGNOSIS — F314 Bipolar disorder, current episode depressed, severe, without psychotic features: Secondary | ICD-10-CM | POA: Diagnosis present

## 2014-07-15 LAB — T4, FREE: FREE T4: 0.72 ng/dL — AB (ref 0.80–1.80)

## 2014-07-15 MED ORDER — LEVOTHYROXINE SODIUM 25 MCG PO TABS
25.0000 ug | ORAL_TABLET | Freq: Every day | ORAL | Status: DC
  Administered 2014-07-16 – 2014-07-20 (×5): 25 ug via ORAL
  Filled 2014-07-15 (×8): qty 1

## 2014-07-15 MED ORDER — BISACODYL 5 MG PO TBEC
5.0000 mg | DELAYED_RELEASE_TABLET | Freq: Every day | ORAL | Status: DC | PRN
Start: 2014-07-15 — End: 2014-07-20

## 2014-07-15 MED ORDER — HYDROCHLOROTHIAZIDE 25 MG PO TABS
25.0000 mg | ORAL_TABLET | Freq: Every day | ORAL | Status: DC
  Administered 2014-07-18: 25 mg via ORAL
  Filled 2014-07-15 (×8): qty 1

## 2014-07-15 MED ORDER — ACETAMINOPHEN 325 MG PO TABS: 650.0000 mg | ORAL_TABLET | Freq: Four times a day (QID) | ORAL | Status: DC | PRN

## 2014-07-15 MED ORDER — LISINOPRIL 10 MG PO TABS
10.0000 mg | ORAL_TABLET | Freq: Every day | ORAL | Status: DC
  Filled 2014-07-15: qty 2
  Filled 2014-07-15 (×3): qty 1

## 2014-07-15 MED ORDER — RITONAVIR 100 MG PO TABS
100.0000 mg | ORAL_TABLET | Freq: Every day | ORAL | Status: DC
  Administered 2014-07-16 – 2014-07-20 (×5): 100 mg via ORAL
  Filled 2014-07-15 (×7): qty 1

## 2014-07-15 MED ORDER — BENZTROPINE MESYLATE 0.5 MG PO TABS
0.5000 mg | ORAL_TABLET | Freq: Two times a day (BID) | ORAL | Status: DC
  Administered 2014-07-16: 0.5 mg via ORAL
  Filled 2014-07-15 (×5): qty 1

## 2014-07-15 MED ORDER — AMLODIPINE BESYLATE 5 MG PO TABS
5.0000 mg | ORAL_TABLET | Freq: Every day | ORAL | Status: DC
  Administered 2014-07-18: 5 mg via ORAL
  Filled 2014-07-15 (×8): qty 1

## 2014-07-15 MED ORDER — ALUM & MAG HYDROXIDE-SIMETH 200-200-20 MG/5ML PO SUSP
30.0000 mL | ORAL | Status: DC | PRN
  Administered 2014-07-19: 30 mL via ORAL
  Filled 2014-07-15: qty 30

## 2014-07-15 MED ORDER — PSEUDOEPHEDRINE HCL ER 120 MG PO TB12
120.0000 mg | ORAL_TABLET | Freq: Two times a day (BID) | ORAL | Status: DC
  Administered 2014-07-15 (×2): 120 mg via ORAL
  Filled 2014-07-15 (×3): qty 1

## 2014-07-15 MED ORDER — EMTRICITABINE-TENOFOVIR DF 200-300 MG PO TABS
1.0000 | ORAL_TABLET | Freq: Every day | ORAL | Status: DC
  Administered 2014-07-16 – 2014-07-20 (×5): 1 via ORAL
  Filled 2014-07-15 (×7): qty 1

## 2014-07-15 MED ORDER — TRAZODONE HCL 50 MG PO TABS: 50.0000 mg | ORAL_TABLET | Freq: Every evening | ORAL | Status: DC | PRN

## 2014-07-15 MED ORDER — DARUNAVIR ETHANOLATE 800 MG PO TABS
800.0000 mg | ORAL_TABLET | Freq: Every day | ORAL | Status: DC
  Administered 2014-07-16 – 2014-07-20 (×5): 800 mg via ORAL
  Filled 2014-07-15 (×7): qty 1

## 2014-07-15 MED ORDER — BISACODYL 5 MG PO TBEC
5.0000 mg | DELAYED_RELEASE_TABLET | Freq: Every day | ORAL | Status: DC | PRN
  Administered 2014-07-15: 5 mg via ORAL
  Filled 2014-07-15 (×3): qty 1

## 2014-07-15 MED ORDER — DIPHENHYDRAMINE HCL 50 MG PO CAPS
50.0000 mg | ORAL_CAPSULE | Freq: Once | ORAL | Status: AC
  Administered 2014-07-15: 50 mg via ORAL
  Filled 2014-07-15: qty 2
  Filled 2014-07-15: qty 1

## 2014-07-15 MED ORDER — LEVETIRACETAM 500 MG PO TABS
500.0000 mg | ORAL_TABLET | Freq: Two times a day (BID) | ORAL | Status: DC
  Administered 2014-07-15 – 2014-07-20 (×10): 500 mg via ORAL
  Filled 2014-07-15 (×15): qty 1

## 2014-07-15 MED ORDER — MAGNESIUM HYDROXIDE 400 MG/5ML PO SUSP: 30.0000 mL | Freq: Every day | ORAL | Status: DC | PRN

## 2014-07-15 MED ORDER — ONDANSETRON 4 MG PO TBDP
4.0000 mg | ORAL_TABLET | Freq: Four times a day (QID) | ORAL | Status: DC | PRN
  Administered 2014-07-15: 4 mg via ORAL
  Filled 2014-07-15 (×2): qty 1

## 2014-07-15 MED ORDER — ONDANSETRON 4 MG PO TBDP
4.0000 mg | ORAL_TABLET | Freq: Four times a day (QID) | ORAL | Status: DC | PRN
  Administered 2014-07-15 – 2014-07-16 (×2): 4 mg via ORAL
  Filled 2014-07-15 (×2): qty 1

## 2014-07-15 NOTE — ED Notes (Signed)
Dulcalax po administered at 1504 for c/o constipation. Continuing to encourage hydration.

## 2014-07-15 NOTE — ED Notes (Signed)
D: Patient mood depressed, anxious, and despairing. Patient affect depressed and anxious. Patient verbalizes both depression and hopelessness of 10 on scale of 0 to 10. Patient verbalizes SI with no plan. She currently denies HI, stating that she "has just decided not to be around them (sister and brother)." She continues to complain of "not feeling well" and nasal "congestion." A: Medications administered per order. Support provided through active listening. MD informed of patient's c/o congestion. Encouraging hydration. R: Patient cooperative. Verbally contracts for safety.

## 2014-07-15 NOTE — ED Notes (Signed)
Patient denies SI, HI, AVH at present. States that thinking of the holidays is upsetting and also still being in the hospital. Rates anxiety 8/10, depression 10/10. "I don't want to be on earth but I don't want to hurt myself".   Encouragement offered.  Q 15 safety checks continue.

## 2014-07-15 NOTE — BH Assessment (Signed)
[place] Assessment Progress Note   Pt has been referred to the following facilities with results as noted:  *[facility]: referral faxed, decision pending *[facility]: referral faxed, decision pending *[facility]: pt declined for unspecified reason *[facility]: at [place]: at [facility]: at [place]: at capacity  Pt had previously been declined by the following facilities for reasons as noted:  [facility]: pt has been declined due to need for long term placement *[facility]: pt has been declined due to need for long term placement  Jalene Mullet, [place] Triage Specialist 07/15/2014 @ 16:25

## 2014-07-15 NOTE — ED Notes (Signed)
D: Patient stated, "I feel like my head is beginning to clear. I still feel very depressed. My mother died Jul 09, 2023, and the holidays have just been so hard. Her birthday was on [birth year] too." A: Support provided through active listening.  R: Patient affect has remained flat and depressed and mood remains depressed.

## 2014-07-15 NOTE — Tx Team (Signed)
Initial Interdisciplinary Treatment Plan   PATIENT STRESSORS: Financial difficulties Loss of mom Medication change or noncompliance Substance abuse   PATIENT STRENGTHS: General fund of knowledge Motivation for treatment/growth   PROBLEM LIST: Problem List/Patient Goals Date to be addressed Date deferred Reason deferred Estimated date of resolution  Depression      "taking my medicine right"      "accepting moms death"                                           DISCHARGE CRITERIA:  Improved stabilization in mood, thinking, and/or behavior Need for constant or close observation no longer present Verbal commitment to aftercare and medication compliance  PRELIMINARY DISCHARGE PLAN: Attend aftercare/continuing care group Attend 12-step recovery group Placement in alternative living arrangements  PATIENT/FAMIILY INVOLVEMENT: This treatment plan has been presented to and reviewed with the patient, Cheryl Thompson, and/or family member, .  The patient and family have been given the opportunity to ask questions and make suggestions.  Migdalia Dk 07/15/2014, 11:26 PM

## 2014-07-15 NOTE — ED Notes (Signed)
Patient c/o nausea. Charmaine Downs, NP, advised. Order obtained for prn Zofran. Medication administered per order.

## 2014-07-15 NOTE — Progress Notes (Signed)
Pt is a 48 year old female admitted with depression with  Who reports overdosing on her medications but slept all day instead of dying   She also reports not taking her medications and she has been drinking ETOH and using cocaine and pot   She reports feeling achy and lacking an appetite  And coughing and sneezing    She is depressed and sad and reports grieving over her moms death   When she first came in she was saying she wanted to hurt her brother and sister but is denying same now    She denies needing a wheelchair and said she has gout in her knees and sometimes needs a cane when it is bothering her    She lived at her moms house but now says that she is homeless and believes her bor and sis who she shares it with want her out   Verbal support given   Medications administered and effectiveness monitored   Pt oriented to the unit and offered nourishment   Pt encouraged to drink plenty of fluids and was given large cup of ginger ale with ice   Q 15 min checks implemented and explained   Pt is safe and verbalized understanding
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
## 2014-07-16 ENCOUNTER — Encounter (HOSPITAL_COMMUNITY): Payer: Self-pay | Admitting: Psychiatry

## 2014-07-16 DIAGNOSIS — F1099 Alcohol use, unspecified with unspecified alcohol-induced disorder: Secondary | ICD-10-CM

## 2014-07-16 DIAGNOSIS — F1490 Cocaine use, unspecified, uncomplicated: Secondary | ICD-10-CM

## 2014-07-16 DIAGNOSIS — R45851 Suicidal ideations: Secondary | ICD-10-CM

## 2014-07-16 DIAGNOSIS — F1020 Alcohol dependence, uncomplicated: Secondary | ICD-10-CM | POA: Diagnosis present

## 2014-07-16 DIAGNOSIS — Z634 Disappearance and death of family member: Secondary | ICD-10-CM

## 2014-07-16 DIAGNOSIS — F1420 Cocaine dependence, uncomplicated: Secondary | ICD-10-CM | POA: Diagnosis present

## 2014-07-16 DIAGNOSIS — F1220 Cannabis dependence, uncomplicated: Secondary | ICD-10-CM | POA: Diagnosis present

## 2014-07-16 DIAGNOSIS — F1290 Cannabis use, unspecified, uncomplicated: Secondary | ICD-10-CM

## 2014-07-16 DIAGNOSIS — F315 Bipolar disorder, current episode depressed, severe, with psychotic features: Secondary | ICD-10-CM

## 2014-07-16 MED ORDER — GABAPENTIN 100 MG PO CAPS
100.0000 mg | ORAL_CAPSULE | Freq: Three times a day (TID) | ORAL | Status: DC
Start: 2014-07-16 — End: 2014-07-20
  Administered 2014-07-16 – 2014-07-20 (×12): 100 mg via ORAL
  Filled 2014-07-16 (×10): qty 1
  Filled 2014-07-16: qty 42
  Filled 2014-07-16 (×7): qty 1
  Filled 2014-07-16 (×2): qty 42

## 2014-07-16 MED ORDER — HALOPERIDOL 5 MG PO TABS: 5.0000 mg | ORAL_TABLET | Freq: Two times a day (BID) | ORAL | Status: DC

## 2014-07-16 MED ORDER — HYDROXYZINE HCL 25 MG PO TABS
25.0000 mg | ORAL_TABLET | Freq: Four times a day (QID) | ORAL | Status: AC | PRN
  Administered 2014-07-16 – 2014-07-17 (×2): 25 mg via ORAL
  Filled 2014-07-16 (×2): qty 1

## 2014-07-16 MED ORDER — ONDANSETRON 4 MG PO TBDP: 4.0000 mg | ORAL_TABLET | Freq: Four times a day (QID) | ORAL | Status: AC | PRN

## 2014-07-16 MED ORDER — THIAMINE HCL 100 MG/ML IJ SOLN
100.0000 mg | Freq: Once | INTRAMUSCULAR | Status: AC
  Administered 2014-07-16: 100 mg via INTRAMUSCULAR
  Filled 2014-07-16: qty 2

## 2014-07-16 MED ORDER — VITAMIN B-1 100 MG PO TABS
100.0000 mg | ORAL_TABLET | Freq: Every day | ORAL | Status: DC
  Administered 2014-07-17 – 2014-07-20 (×4): 100 mg via ORAL
  Filled 2014-07-16 (×6): qty 1

## 2014-07-16 MED ORDER — LORAZEPAM 1 MG PO TABS
1.0000 mg | ORAL_TABLET | Freq: Four times a day (QID) | ORAL | Status: AC | PRN
  Administered 2014-07-16: 1 mg via ORAL
  Filled 2014-07-16: qty 1

## 2014-07-16 MED ORDER — ADULT MULTIVITAMIN W/MINERALS CH
1.0000 | ORAL_TABLET | Freq: Every day | ORAL | Status: DC
  Administered 2014-07-16 – 2014-07-20 (×5): 1 via ORAL
  Filled 2014-07-16 (×8): qty 1

## 2014-07-16 MED ORDER — LAMOTRIGINE 25 MG PO TABS
25.0000 mg | ORAL_TABLET | Freq: Every day | ORAL | Status: DC
Start: 2014-07-16 — End: 2014-07-20
  Administered 2014-07-16 – 2014-07-20 (×4): 25 mg via ORAL
  Filled 2014-07-16: qty 14
  Filled 2014-07-16 (×7): qty 1

## 2014-07-16 MED ORDER — MUSCLE RUB 10-15 % EX CREA
TOPICAL_CREAM | CUTANEOUS | Status: DC | PRN
  Administered 2014-07-17: 21:00:00 via TOPICAL
  Filled 2014-07-16: qty 85

## 2014-07-16 MED ORDER — LIDOCAINE 5 % EX PTCH
1.0000 | MEDICATED_PATCH | CUTANEOUS | Status: DC | PRN
  Filled 2014-07-16: qty 1

## 2014-07-16 MED ORDER — LOPERAMIDE HCL 2 MG PO CAPS: 2.0000 mg | ORAL_CAPSULE | ORAL | Status: AC | PRN

## 2014-07-16 MED ORDER — HALOPERIDOL 5 MG PO TABS
5.0000 mg | ORAL_TABLET | ORAL | Status: DC
  Administered 2014-07-16 – 2014-07-20 (×8): 5 mg via ORAL
  Filled 2014-07-16 (×5): qty 1
  Filled 2014-07-16: qty 28
  Filled 2014-07-16 (×5): qty 1
  Filled 2014-07-16: qty 28
  Filled 2014-07-16 (×3): qty 1

## 2014-07-16 MED ORDER — BENZTROPINE MESYLATE 0.5 MG PO TABS: 0.5000 mg | ORAL_TABLET | Freq: Two times a day (BID) | ORAL | Status: DC

## 2014-07-16 MED ORDER — BENZTROPINE MESYLATE 0.5 MG PO TABS
0.5000 mg | ORAL_TABLET | ORAL | Status: DC
  Administered 2014-07-16 – 2014-07-20 (×8): 0.5 mg via ORAL
  Filled 2014-07-16 (×6): qty 1
  Filled 2014-07-16: qty 28
  Filled 2014-07-16 (×4): qty 1
  Filled 2014-07-16: qty 28
  Filled 2014-07-16: qty 1
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
## 2014-07-16 NOTE — Tx Team (Signed)
Interdisciplinary Treatment Plan Update (Adult)   Date: 07/16/2014   Time Reviewed: 8:24 AM  Progress in Treatment:  Attending groups: Yes  Participating in groups: Yes    Taking medication as prescribed: Yes  Tolerating medication: Yes  Family/Significant othe contact made: No. Pt refusing at this time "I don't have anyone to give you that is a positive support for me."   Patient understands diagnosis: Yes, AEB seeking treatment for depression/mood instability, SI with attempted overdose, ETOH detox/marijuana and crack use, and for medication stabilization.  Discussing patient identified problems/goals with staff: Yes  Medical problems stabilized or resolved: Yes  Denies suicidal/homicidal ideation: Yes during group/self report.  Patient has not harmed self or Others: Yes  New problem(s) identified:  Discharge Plan or Barriers: Pt plans to follow-up at [facility] [place] if IPRS funds available. She needs two appts rescheduled by CSW when offices open. CSW assessing. Pt plans to either move back into her mother's home with her sister or find alterative living arrangements being that her sister is a trigger for her and not a good support. Pt requesting info about applying for Medicaid and Disability.  Additional comments: Pt is a 49 year old female admitted with depression with Who reports overdosing on her medications but slept all day instead of dying She also reports not taking her medications and she has been drinking ETOH and using cocaine and pot She reports feeling achy and lacking an appetite And coughing and sneezing She is depressed and sad and reports grieving over her moms death When she first came in she was saying she wanted to hurt her brother and sister but is denying same now She denies needing a wheelchair and said she has gout in her knees and sometimes needs a cane when it is bothering her She lived at her moms house but now says that she is homeless and  believes her bor and sis who she shares it with want her out  Reason for Continuation of Hospitalization: SI Mood stabilization Medication management  Estimated length of stay: 5-7 days  For review of initial/current patient goals, please see plan of care.  Attendees:  Patient:    Family:    Physician: Dr. Shea Evans MD 07/16/2014 8:24 AM   Nursing:  07/16/2014 8:24 AM   Clinical Social Worker [place], [place]  07/16/2014 8:24 AM   Other: Ripley Fraise, [place] 07/16/2014 8:24 AM   Other:  07/16/2014 8:24 AM   Other:   Other:    Scribe for Treatment Team:  [place] LCSWA 07/16/2014 8:24 AM

## 2014-07-16 NOTE — [facility] Suicide Risk Assessment (Signed)
   Nursing information obtained from:    Demographic factors:    Current Mental Status:    Loss Factors:    Historical Factors:    Risk Reduction Factors:    Total Time spent with patient: 45 minutes  CLINICAL FACTORS:   Alcohol/Substance Abuse/Dependencies Unstable or Poor Therapeutic Relationship Previous Psychiatric Diagnoses and Treatments Medical Diagnoses and Treatments/Surgeries  Psychiatric Specialty Exam: Physical Exam  ROS  Blood pressure 89/57, pulse 102, temperature 97.9 F (36.6 C), temperature source Oral, resp. rate 16, height 5\' 9"  (1.753 m), weight 58.968 kg (130 lb).Body mass index is 19.19 kg/(m^2).          Please see H&P FOR MSE.                                       Sleep:  Number of Hours: 5.5    SUICIDE RISK:   Moderate:  Frequent suicidal ideation with limited intensity, and duration, some specificity in terms of plans, no associated intent, good self-control, limited dysphoria/symptomatology, some risk factors present, and identifiable protective factors, including available and accessible social support.  PLAN OF CARE:Please see H&P.   I certify that inpatient services furnished can reasonably be expected to improve the patient's condition.  Simpson Paulos md 07/16/2014, 10:30 AM
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
## 2014-07-16 NOTE — BHH Group Notes (Signed)
[facility] LCSW Aftercare Discharge Planning Group Note   07/16/2014 10:31 AM  Participation Quality:  Appropriate   Mood/Affect:  Depressed and Flat  Depression Rating: 9  Anxiety Rating:  10  Thoughts of Suicide:  No Will you contract for safety?   NA  Current AVH:  No  Plan for Discharge/Comments:  Pt reports that she was unable to cope with her mother's death on her birthday, relapsed on ETOH, marijuana, and crack, and stopped taking her meds correctly. Pt reports that she did not go to follow-up appts and is having issues with her siblings who "yell and scream all the time." Pt reports HI thoughts toward her sister but "would never act on them." Pt reports that she no longer has HI/SI thoughts and is hoping to get stable on meds and get plan to return home or find another support to live with at d/c. CSW assessing. Pt likely follow=up at [facility] and needs appts for PCP/Dr Rodena Piety at [facility] and her appt at [facility] Infectious Disease moved to later date since she is in the hospital. Offices closed today. CSW to reschedule these appts Monday.   Transportation Means: bus   Supports: none identified by pt at this time.   Smart, [employer]

## 2014-07-16 NOTE — Progress Notes (Signed)
Patient ID: ELVENIA GODDEN, female   DOB: 11/01/1965, 49 y.o.   MRN: [id number] D. Patient presents with depressed mood, affect blunted. Braleigh reports she has been feeling dizzy and reports poor appetite and po intake. She states '' I was nauseated and they gave me something but I didn't eat breakfast. '' No I guess I haven't been doing well. I was not taking my medications right and messing with them and got worse. I've been depressed and suicidal and wanting to hurt my family but I guess I feel a little better cause I don't want to kill myself or kill nobody today. ' patient reports she is able to contract for safety. She completed her self inventory and rates her depression and anxiety at a 10/10. Discussed above information with Dr. Shea Evans. Encouraged po fluid intake and noted low bp so Probation officer provided gatorade and held am antihypertensives. Notified Dr. Shea Evans. Pt complied and was able to tolerate and eat granola bar. She has been interactive on the unit. She does report VH at times. R. Patient is safe, will continue to monitor q 15 minutes for safety.

## 2014-07-17 LAB — TSH: TSH: 7.348 u[IU]/mL — ABNORMAL HIGH (ref 0.350–4.500)

## 2014-07-17 MED ORDER — LORATADINE 10 MG PO TABS
ORAL_TABLET | ORAL | Status: AC
  Administered 2014-07-17: 10 mg
  Filled 2014-07-17: qty 1

## 2014-07-17 MED ORDER — TRAMADOL HCL 50 MG PO TABS
50.0000 mg | ORAL_TABLET | Freq: Two times a day (BID) | ORAL | Status: DC | PRN
  Administered 2014-07-19: 50 mg via ORAL
  Filled 2014-07-17: qty 1

## 2014-07-17 MED ORDER — SALINE SPRAY 0.65 % NA SOLN
1.0000 | NASAL | Status: DC | PRN
  Administered 2014-07-18: 1 via NASAL
  Filled 2014-07-17: qty 44

## 2014-07-17 MED ORDER — TRAZODONE HCL 100 MG PO TABS
100.0000 mg | ORAL_TABLET | Freq: Every day | ORAL | Status: DC
  Administered 2014-07-17 – 2014-07-19 (×3): 100 mg via ORAL
  Filled 2014-07-17 (×2): qty 1
  Filled 2014-07-17: qty 14
  Filled 2014-07-17 (×2): qty 1

## 2014-07-17 MED ORDER — LORATADINE 10 MG PO TABS
10.0000 mg | ORAL_TABLET | Freq: Every day | ORAL | Status: DC
  Administered 2014-07-18 – 2014-07-20 (×3): 10 mg via ORAL
  Filled 2014-07-17 (×6): qty 1

## 2014-07-17 NOTE — Plan of Care (Signed)
Problem: Ineffective individual coping Goal: LTG: Patient will report a decrease in negative feelings Outcome: Progressing Pt making positive statements about her brother and sister Goal: STG: Patient will remain free from self harm Pt is free from self harm behaviors  Problem: Diagnosis: Increased Risk For Suicide Attempt Goal: LTG-Patient Will Show Positive Response to Medication LTG (by discharge) : Patient will show positive response to medication and will participate in the development of the discharge plan.  Outcome: Progressing Pt thinking is more logical and goal directed   She realizes she misintreped brothers and sisters actions Goal: LTG-Patient Will Report Improved Mood and Deny Suicidal LTG (by discharge) Patient will report improved mood and deny suicidal ideation.  Pt is brighter and said she feels better   She denies suicidal ideation Goal: STG-Patient Will Comply With Medication Regime Outcome: Progressing Pt is medication compliant and reports understanding that not taking medications makes you get sick again

## 2014-07-17 NOTE — Progress Notes (Signed)
D: Pt has depressed affect and mood.  Pt reports "I'm doing much better today."  Pt reports "I finished my workbook on relapse prevention.  I learned a lot about myself."  Pt denies SI/HI, denies hallucinations stating "not today."  Pt reports "when I left the first time I took all my medicine wrong on a accidental purpose.  Everything is looking up now."  Pt interacts with staff and peers appropriately.   A: Medication administered per order.  Safety maintained.  Met with pt 1:1 and provided support and encouragement.  PO fluids encouraged.  PRN medication administered for muscle pain, see flowsheet.  Room for pt to transfer to unavailable at this time.  Will inform next shift.   R: Pt is compliant with medications.  She verbally contracts for safety.  She reported that she will notify staff of needs and concerns.  Will continue to monitor and assess for safety.

## 2014-07-17 NOTE — Progress Notes (Signed)
Patient ID: LEZLIE RITCHEY, female   DOB: January 11, 1966, 49 y.o.   MRN: [id number] Adult Psychoeducational Group Note  Date:  07/17/2014 Time: 09:35  Group Topic/Focus:  Orientation:   The focus of this group is to educate the patient on the purpose and policies of crisis stabilization and provide a format to answer questions about their admission.  The group details unit policies and expectations of patients while admitted.  Participation Level:  Active  Participation Quality:  Appropriate and Monopolizing  Affect:  Anxious and Resistant  Cognitive:  Appropriate  Insight: Lacking  Engagement in Group:  Engaged, Monopolizing and Off Topic  Modes of Intervention:  Discussion, Education, Orientation and Support  Additional Comments:  Pt able to identify one goal to accomplish during treatment. Pt is active during group but tangential at times.   Elenore Rota 07/17/2014, 11:01 AM

## 2014-07-17 NOTE — Plan of Care (Signed)
Problem: Alteration in mood & ability to function due to Goal: STG-Patient will comply with prescribed medication regimen (Patient will comply with prescribed medication regimen)  Outcome: Progressing Pt has been compliant with all medications this shift.

## 2014-07-17 NOTE — Progress Notes (Signed)
Adult Psychoeducational Group Note  Date:  07/17/2014 Time:  3:41 PM  Group Topic/Focus:  Coping With Mental Health Crisis:   The purpose of this group is to help patients identify strategies for coping with mental health crisis.  Group discusses possible causes of crisis and ways to manage them effectively.  Participation Level:  Did Not Attend  Participation Quality: n/a  Affect: n/a  Cognitive:  n/a  Insight: n/a  Engagement in Group:  n/a  Modes of Intervention:  Confrontation, Discussion, Exploration and Support  Additional Comments:  Pt did not attend group. Pt was in bed asleep.   Elenore Rota 07/17/2014, 3:41 PM

## 2014-07-17 NOTE — Progress Notes (Signed)
[facility] MD Progress Note  07/17/2014 12:16 PM Cheryl Thompson  MRN:  [id number] Subjective: Patient states 'I feel better today ,I spoke to my family and they are going to help me." Objective:Cheryl Thompson is an 49 y.o. AA female with hx of bipolar disorder presented to [facility] for depression as well as "bad thoughts ". Patient seen and chart reviewed. I have reviewed patient with nursing staff. Patient today reports improvement in her mood. She continues to reports pain issues ,especially her back . Patient with renal function as well as LFT abnormalities. Advised to limit the use of pain medications as much as she can ,will make available heat application,topical pain medications as well as Tramadol prn for severe pain. Patient today continues to talk about mood lability as well as irritability and her obsession and imagining to hurt her family. Patient does report anger management issues. Patient denies any SI/HI/AH/VH today. Patient denies any side effects of medications.  Diagnosis:   DSM5: Primary Psychiatric Diagnosis: Bipolar disorder,type I ,most recent episode depressed ,severe with psychosis   Secondary Psychiatric Diagnosis: Bereavement  Stimulant use disorder (cocaine) severe  Cannabis use disorder,severe Alcohol use disorder,severe   Non Psychiatric Diagnosis: HIV Seizure disorder Hepatitis C Arthritis Gout  Total Time spent with patient: 45 minutes   ADL's:  Intact  Sleep: Poor  Appetite:  Fair  Psychiatric Specialty Exam: Physical Exam  ROS  Blood pressure 109/73, pulse 104, temperature 99.8 F (37.7 C), temperature source Oral, resp. rate 18, height 5\' 9"  (1.753 m), weight 58.968 kg (130 lb).Body mass index is 19.19 kg/(m^2).  General Appearance: Casual  Eye Contact::  Fair  Speech:  Normal Rate  Volume:  Normal  Mood:  Anxious, Depressed and Irritable  Affect:  Congruent  Thought Process:  Goal Directed  Orientation:  Full (Time, Place, and Person)   Thought Content:  Paranoid Ideation and Rumination  Suicidal Thoughts:  No  Homicidal Thoughts:  No  Memory:  Immediate;   Fair Recent;   Fair Remote;   Fair  Judgement:  Impaired  Insight:  Shallow  Psychomotor Activity:  Normal  Concentration:  Fair  Recall:  [place]: Fair  Akathisia:  No  Handed:  Right  AIMS (if indicated):   0 (07/17/14)  Assets:  Social Support  Sleep:  Number of Hours: 6.25   Musculoskeletal: Strength & Muscle Tone: within normal limits Gait & Station: normal Patient leans: N/A  Current Medications: Current Facility-Administered Medications  Medication Dose Route Frequency Provider Last Rate Last Dose  . alum & mag hydroxide-simeth (MAALOX/MYLANTA) 200-200-20 MG/5ML suspension 30 mL  30 mL Oral Q4H PRN Waylan Boga, NP      . amLODipine (NORVASC) tablet 5 mg  5 mg Oral Daily Waylan Boga, NP   5 mg at 07/16/14 0749  . haloperidol (HALDOL) tablet 5 mg  5 mg Oral BH-qamhs Alexah Kivett, MD   5 mg at 07/17/14 0818   And  . benztropine (COGENTIN) tablet 0.5 mg  0.5 mg Oral BH-qamhs Kynzie Polgar, MD   0.5 mg at 07/17/14 0819  . bisacodyl (DULCOLAX) EC tablet 5 mg  5 mg Oral Daily PRN Waylan Boga, NP      . Darunavir Ethanolate (PREZISTA) tablet 800 mg  800 mg Oral Q breakfast Waylan Boga, NP   800 mg at 07/17/14 0819  . emtricitabine-tenofovir (TRUVADA) 200-300 MG per tablet 1 tablet  1 tablet Oral Daily Waylan Boga, NP   1 tablet  at 07/17/14 0816  . gabapentin (NEURONTIN) capsule 100 mg  100 mg Oral TID Ursula Alert, MD   100 mg at 07/17/14 1144  . hydrochlorothiazide (HYDRODIURIL) tablet 25 mg  25 mg Oral Daily Waylan Boga, NP   25 mg at 07/16/14 0750  . hydrOXYzine (ATARAX/VISTARIL) tablet 25 mg  25 mg Oral Q6H PRN Ursula Alert, MD   25 mg at 07/17/14 1012  . lamoTRIgine (LAMICTAL) tablet 25 mg  25 mg Oral Daily Ursula Alert, MD   25 mg at 07/17/14 0819  . levETIRAcetam (KEPPRA) tablet 500 mg  500 mg Oral BID  Waylan Boga, NP   500 mg at 07/17/14 0818  . levothyroxine (SYNTHROID, LEVOTHROID) tablet 25 mcg  25 mcg Oral QAC breakfast Waylan Boga, NP   25 mcg at 07/17/14 5625  . lidocaine (LIDODERM) 5 % 1 patch  1 patch Transdermal PRN Ursula Alert, MD      . loperamide (IMODIUM) capsule 2-4 mg  2-4 mg Oral PRN Ursula Alert, MD      . LORazepam (ATIVAN) tablet 1 mg  1 mg Oral Q6H PRN Ursula Alert, MD   1 mg at 07/16/14 2116  . magnesium hydroxide (MILK OF MAGNESIA) suspension 30 mL  30 mL Oral Daily PRN Waylan Boga, NP      . multivitamin with minerals tablet 1 tablet  1 tablet Oral Daily Ursula Alert, MD   1 tablet at 07/17/14 0817  . MUSCLE RUB CREA   Topical PRN Ursula Alert, MD      . ondansetron (ZOFRAN-ODT) disintegrating tablet 4 mg  4 mg Oral Q6H PRN Ursula Alert, MD      . ritonavir (NORVIR) tablet 100 mg  100 mg Oral Q breakfast Waylan Boga, NP   100 mg at 07/17/14 0815  . thiamine (VITAMIN B-1) tablet 100 mg  100 mg Oral Daily Ursula Alert, MD   100 mg at 07/17/14 0816  . traMADol (ULTRAM) tablet 50 mg  50 mg Oral Q12H PRN Ursula Alert, MD      . traZODone (DESYREL) tablet 100 mg  100 mg Oral QHS Ursula Alert, MD        Lab Results:  Results for orders placed or performed during the hospital encounter of 07/15/14 (from the past 48 hour(s))  TSH     Status: Abnormal   Collection Time: 07/17/14  6:30 AM  Result Value Ref Range   TSH 7.348 (H) 0.350 - 4.500 uIU/mL    Comment: Performed at [facility]    Physical Findings: AIMS: Facial and Oral Movements Muscles of Facial Expression: None, normal Lips and Perioral Area: None, normal Jaw: None, normal Tongue: None, normal,Extremity Movements Upper (arms, wrists, hands, fingers): None, normal Lower (legs, knees, ankles, toes): None, normal, Trunk Movements Neck, shoulders, hips: None, normal, Overall Severity Severity of abnormal movements (highest score from questions above): None, normal Incapacitation  due to abnormal movements: None, normal Patient's awareness of abnormal movements (rate only patient's report): No Awareness, Dental Status Current problems with teeth and/or dentures?: No Does patient usually wear dentures?: No  CIWA:  CIWA-Ar Total: 0 COWS:     Treatment Plan Summary: Daily contact with patient to assess and evaluate symptoms and progress in treatment Medication management  Plan:  Will continue Haldol 5 mg po bid with Cogentin for EPS.AIMS -0 (07/17/14) Will restart Lamictal 25 mg po daily for mood lability. Trazodone increased to 100 mg po qhs for sleep. Will add Gabapentin 100 mg po tid for  anxiety sx .  CIWA /ATIVAN prn for alcohol withdrawal. Will restart home medications where needed.Will DC lisinopril due to low BP . Will hold her other antihypertensive and monitor VS. Chaplain consult for grief.  Will continue to monitor vitals ,medication compliance and treatment side effects while patient is here.  Will monitor for medical issues as well as call consult as needed.  Reviewed labs ,TSH abnormal ,will continue Levothyroxine - patient was noncompliant on her medications at home. Patient could follow up with outpatient provider for management. CSW will start working on disposition.  Patient to participate in therapeutic milieu .    Medical Decision Making Problem Points:  Established problem, stable/improving (1), Review of last therapy session (1) and Review of psycho-social stressors (1) Data Points:  Order Aims Assessment (2) Review or order medicine tests (1) Review and summation of old records (2) Review of medication regiment & side effects (2) Review of new medications or change in dosage (2)  I certify that inpatient services furnished can reasonably be expected to improve the patient's condition.   Dezmon Conover MD 07/17/2014, 12:16 PM

## 2014-07-17 NOTE — Progress Notes (Addendum)
Patient ID: SOLENNE MANWARREN, female   DOB: 06/24/1966, 49 y.o.   MRN: [id number]  Pt currently presents with a flat affect and anxious behavior. Per self inventory, pt rates depression at a 6, hopelessness 10 and anxiety 1. Pt's daily goal is to " anxiety level and paranoid level" and they intend to do so by " by staying in the here and now." Pt reports  fair sleep and concentration and good appetite. Pt has many somatic complaints. Pt becomes tangential in the middle of conversations.  Pt provided with prn and ordered medications per providers orders. Pt's labs and vitals were monitored throughout the day. Pt supported emotionally and encouraged to express concerns and questions. Pt consulted with Md and Probation officer. Pt educated on coping skills and unit rules. Pt's safety ensured with 15 minute and environmental checks. Pt currently denies SI/HI and A/V hallucinations. Pt verbally agrees to seek staff if SI/HI or A/VH occurs and to consult with staff before acting on these thoughts. Pt endorses paranoid thoughts. Pt also endorses R side lower extremity numbness.

## 2014-07-17 NOTE — Progress Notes (Signed)
Adult Psychoeducational Group Note  Date:  07/17/2014 Time:  12:10 AM  Group Topic/Focus:  Wrap-Up Group:   The focus of this group is to help patients review their daily goal of treatment and discuss progress on daily workbooks.  Participation Level:  Active  Participation Quality:  Appropriate and Attentive  Affect:  Appropriate  Cognitive:  Appropriate  Insight: Appropriate  Engagement in Group:  Engaged  Modes of Intervention:  Discussion  Additional Comments:  Pt stated she had a terrible night last night, but today was a good day. Pt stated she took responsibility for acting crazy. Pt stated tomorrow she wanted to work on her discharge plans so that she can go home which she is excited for. Pt stated she initially did not think she would be able to go back home, but now she can.  Clint Bolder 07/17/2014, 12:10 AM

## 2014-07-17 NOTE — Progress Notes (Signed)
D   Pt is pleasant on approach   She is thinking more positively and said she talked with her brother and sister and she can go back to the house they share   Pt has been visible on the milieu and is interacting appropriately with others   She did report she bumped her leg on the bed when she got up   Visually examined leg and there was a small red area   Pt refused any interventions for same   A   Verbal support given    Medications administered and effectiveness monitored    Q 15 min checks R   Pt safe at present

## 2014-07-17 NOTE — Progress Notes (Signed)
Patient ID: Cheryl Thompson, female   DOB: 31-Jul-1965, 49 y.o.   MRN: [id number]  Pt complaining of sinus congestion and itching. Writer consulted with provider. Pt given claritin as ordered. Waiting on nasal spray. Pt temp WNL.

## 2014-07-17 NOTE — Progress Notes (Signed)
Patient ID: Cheryl Thompson, female   DOB: [birth year], 49 y.o.   MRN: [id number]  See MD care order for 07/17/2014. A different bed was unavailable for patient swap. Charge consulted. Order passed on to oncoming shift per availability.

## 2014-07-17 NOTE — [facility] Group Notes (Signed)
[facility] LCSW Group Therapy 07/17/2014 1:15pm  Type of Therapy and Topic: Group Therapy: Avoiding Self-Sabotaging and Enabling Behaviors   Participation Level: Active  Description of Group:  Learn how to identify obstacles, self-sabotaging and enabling behaviors, what are they, why do we do them and what needs do these behaviors meet? Discuss unhealthy relationships and how to have positive healthy boundaries with those that sabotage and enable. Explore aspects of self-sabotage and enabling in yourself and how to limit these self-destructive behaviors in everyday life. A scaling question is used to help patient look at where they are now in their motivation to change, from 1 to 10 (lowest to highest motivation).   Therapeutic Goals:  1. Patient will identify one obstacle that relates to self-sabotage and enabling behaviors 2. Patient will identify one personal self-sabotaging or enabling behavior they did prior to admission 3. Patient able to establish a plan to change the above identified behavior they did prior to admission:  4. Patient will demonstrate ability to communicate their needs through discussion and/or role plays.  Summary of Patient Progress:  Pt was actively engaged in group discussion and was able to identify her substance use as a self-sabotaging behavior. Pt reported that she used substance abuse as a way of coping with problems but verbalized understanding that this was an unhealthy coping strategy. Pt reports her motivation to remove herself from people, places, and things that are associated with drug use. Pt described feelings of sadness regarding the recent death of her mother and how she had been trying to process this during the holiday season.   Therapeutic Modalities:  Cognitive Behavioral Therapy  Person-Centered Therapy  Motivational Interviewing   Peri Maris, [place] 07/17/2014 12:10 PM

## 2014-07-18 ENCOUNTER — Encounter (HOSPITAL_COMMUNITY): Payer: Self-pay | Admitting: Registered Nurse

## 2014-07-18 DIAGNOSIS — F1590 Other stimulant use, unspecified, uncomplicated: Secondary | ICD-10-CM

## 2014-07-18 DIAGNOSIS — F314 Bipolar disorder, current episode depressed, severe, without psychotic features: Principal | ICD-10-CM

## 2014-07-18 NOTE — Progress Notes (Signed)
Patient ID: Cheryl Thompson, female   DOB: 08-Mar-1966, 49 y.o.   MRN: [id number] [facility] MD Progress Note  07/18/2014 10:08 AM Cheryl Thompson  MRN:  [id number]   Subjective: Patient rates depression 8/10 and anxiety 8/10. Patient states that she continues to see herself as getting better with her addition to ETOH.  Patient also complains of paranoia stating "I just feel like people are out to get me."  Patient is denying thoughts of suicide at this time but continues to complain of depression.  Patient states that she is sleeping okay but states that she is being waked frequently.    Objective:  Patient seen and Chart reviewed/discussed with nursing staff. Patient found in bed. Patient reports improvement in mood.  She continues to report depression without suicidal thoughts and anxiety at this time.   Tolerating medications with out adverse effect; and appetite is improving.    Denies homicidal ideation, auditory/visual/tactile hallucinations; and delusional thoughts.  Patient denies any thoughts of wanting to hurt her family today.  Improvement in mood and denies anger and irritability at this time    .  Diagnosis:   DSM5: Primary Psychiatric Diagnosis: Bipolar disorder,type I ,most recent episode depressed ,severe with psychosis   Secondary Psychiatric Diagnosis: Bereavement  Stimulant use disorder (cocaine) severe  Cannabis use disorder,severe Alcohol use disorder,severe   Non Psychiatric Diagnosis: HIV Seizure disorder Hepatitis C Arthritis Gout  Total Time spent with patient: 45 minutes   ADL's:  Intact  Sleep: Improving  Appetite:  Improving  Psychiatric Specialty Exam: Physical Exam  Psychiatric: Her speech is normal. Her mood appears anxious. Thought content is paranoid. Thought content is not delusional. Cognition and memory are normal. She exhibits a depressed mood. She expresses no homicidal and no suicidal ideation.    Review of Systems  Gastrointestinal:  Negative for nausea, vomiting, abdominal pain and diarrhea.  Neurological: Positive for tremors (Improving).  Psychiatric/Behavioral: Positive for depression and substance abuse (ETOH). Negative for hallucinations and memory loss. Suicidal ideas: Denies at this time. The patient is nervous/anxious and has insomnia.   All other systems reviewed and are negative.   Blood pressure 108/69, pulse 104, temperature 98 F (36.7 C), temperature source Oral, resp. rate 20, height 5\' 9"  (1.753 m), weight 58.968 kg (130 lb), SpO2 100 %.Body mass index is 19.19 kg/(m^2).  General Appearance: Casual  Eye Contact::  Good  Speech:  Normal Rate  Volume:  Normal  Mood:  Anxious and Depressed  Affect:  Congruent  Thought Process:  Goal Directed  Orientation:  Full (Time, Place, and Person)  Thought Content:  Paranoid Ideation and Rumination  Suicidal Thoughts:  No  Homicidal Thoughts:  No  Memory:  Immediate;   Fair Recent;   Fair Remote;   Fair  Judgement:  Impaired  Insight:  Fair  Psychomotor Activity:  Normal  Concentration:  Fair  Recall:  [employer] of Knowledge:Fair  Language: Fair  Akathisia:  No  Handed:  Right  AIMS (if indicated):   0 (07/17/14)  Assets:  Social Support  Sleep:  Number of Hours: 6.75   Musculoskeletal: Strength & Muscle Tone: within normal limits Gait & Station: normal Patient leans: N/A  Current Medications: Current Facility-Administered Medications  Medication Dose Route Frequency Provider Last Rate Last Dose  . alum & mag hydroxide-simeth (MAALOX/MYLANTA) 200-200-20 MG/5ML suspension 30 mL  30 mL Oral Q4H PRN Waylan Boga, NP      . amLODipine (NORVASC) tablet 5 mg  5 mg  Oral Daily Waylan Boga, NP   5 mg at 07/18/14 0810  . haloperidol (HALDOL) tablet 5 mg  5 mg Oral BH-qamhs Saramma Eappen, MD   5 mg at 07/18/14 0810   And  . benztropine (COGENTIN) tablet 0.5 mg  0.5 mg Oral BH-qamhs Saramma Eappen, MD   0.5 mg at 07/18/14 0811  . bisacodyl (DULCOLAX) EC  tablet 5 mg  5 mg Oral Daily PRN Waylan Boga, NP      . Darunavir Ethanolate (PREZISTA) tablet 800 mg  800 mg Oral Q breakfast Waylan Boga, NP   800 mg at 07/18/14 0810  . emtricitabine-tenofovir (TRUVADA) 200-300 MG per tablet 1 tablet  1 tablet Oral Daily Waylan Boga, NP   1 tablet at 07/18/14 0810  . gabapentin (NEURONTIN) capsule 100 mg  100 mg Oral TID Ursula Alert, MD   100 mg at 07/18/14 0811  . hydrochlorothiazide (HYDRODIURIL) tablet 25 mg  25 mg Oral Daily Waylan Boga, NP   25 mg at 07/18/14 0810  . hydrOXYzine (ATARAX/VISTARIL) tablet 25 mg  25 mg Oral Q6H PRN Ursula Alert, MD   25 mg at 07/17/14 1012  . lamoTRIgine (LAMICTAL) tablet 25 mg  25 mg Oral Daily Ursula Alert, MD   25 mg at 07/18/14 0811  . levETIRAcetam (KEPPRA) tablet 500 mg  500 mg Oral BID Waylan Boga, NP   500 mg at 07/18/14 0811  . levothyroxine (SYNTHROID, LEVOTHROID) tablet 25 mcg  25 mcg Oral QAC breakfast Waylan Boga, NP   25 mcg at 07/18/14 [phone]  . lidocaine (LIDODERM) 5 % 1 patch  1 patch Transdermal PRN Ursula Alert, MD      . loperamide (IMODIUM) capsule 2-4 mg  2-4 mg Oral PRN Ursula Alert, MD      . loratadine (CLARITIN) tablet 10 mg  10 mg Oral Daily Benjamine Mola, FNP   10 mg at 07/18/14 0810  . LORazepam (ATIVAN) tablet 1 mg  1 mg Oral Q6H PRN Ursula Alert, MD   1 mg at 07/16/14 2116  . magnesium hydroxide (MILK OF MAGNESIA) suspension 30 mL  30 mL Oral Daily PRN Waylan Boga, NP      . multivitamin with minerals tablet 1 tablet  1 tablet Oral Daily Ursula Alert, MD   1 tablet at 07/18/14 0811  . MUSCLE RUB CREA   Topical PRN Ursula Alert, MD      . ondansetron (ZOFRAN-ODT) disintegrating tablet 4 mg  4 mg Oral Q6H PRN Ursula Alert, MD      . ritonavir (NORVIR) tablet 100 mg  100 mg Oral Q breakfast Waylan Boga, NP   100 mg at 07/18/14 0811  . sodium chloride (OCEAN) 0.65 % nasal spray 1 spray  1 spray Each Nare Q4H PRN Benjamine Mola, FNP   1 spray at 07/18/14 [phone]  . thiamine  (VITAMIN B-1) tablet 100 mg  100 mg Oral Daily Ursula Alert, MD   100 mg at 07/18/14 0811  . traMADol (ULTRAM) tablet 50 mg  50 mg Oral Q12H PRN Ursula Alert, MD      . traZODone (DESYREL) tablet 100 mg  100 mg Oral QHS Ursula Alert, MD   100 mg at 07/17/14 2102    Lab Results:  Results for orders placed or performed during the hospital encounter of 07/15/14 (from the past 48 hour(s))  TSH     Status: Abnormal   Collection Time: 07/17/14  6:30 AM  Result Value Ref Range   TSH 7.348 (H)  0.350 - 4.500 uIU/mL    Comment: Performed at [facility]    Physical Findings: AIMS: Facial and Oral Movements Muscles of Facial Expression: None, normal Lips and Perioral Area: None, normal Jaw: None, normal Tongue: None, normal,Extremity Movements Upper (arms, wrists, hands, fingers): None, normal Lower (legs, knees, ankles, toes): None, normal, Trunk Movements Neck, shoulders, hips: None, normal, Overall Severity Severity of abnormal movements (highest score from questions above): None, normal Incapacitation due to abnormal movements: None, normal Patient's awareness of abnormal movements (rate only patient's report): No Awareness, Dental Status Current problems with teeth and/or dentures?: No Does patient usually wear dentures?: No  CIWA:  CIWA-Ar Total: 2 COWS:     Treatment Plan Summary: Daily contact with patient to assess and evaluate symptoms and progress in treatment Medication management  Plan:  Will continue Haldol 5 mg po bid with Cogentin for EPS.AIMS -0 (07/17/14) Continue Lamictal 25 mg po daily for mood lability; Trazodone increased to 100 mg po qhs for sleep; Gabapentin 100 mg po tid for anxiety sx; and  CIWA /ATIVAN prn for alcohol withdrawal. Home medications needed was restarted and lisinopril was discontinued yesterday related to low BP.  Will continue to hold her other antihypertensives and monitor VS. Will also continue to monitor vitals, medication  compliance and treatment side effects while patient is here.  Will monitor for medical issues as well as call consult as needed.  Reviewed labs ,TSH abnormal ,will continue Levothyroxine - patient was noncompliant on her medications at home. Patient could follow up with outpatient provider for management. CSW will start working on disposition.  Patient to participate in therapeutic milieu .    Medical Decision Making Problem Points:  Established problem, stable/improving (1), Review of last therapy session (1) and Review of psycho-social stressors (1) Data Points:  Order Aims Assessment (2) Review or order medicine tests (1) Review and summation of old records (2) Review of medication regiment & side effects (2) Review of new medications or change in dosage (2)  I certify that inpatient services furnished can reasonably be expected to improve the patient's condition.   Usama Harkless, FNP-BC 07/18/2014, 10:08 AM

## 2014-07-18 NOTE — [facility] Group Notes (Signed)
[facility] LCSW Group Therapy  07/18/2014   11:00 AM   Type of Therapy:  Group Therapy  Participation Level:  Active  Participation Quality:  Appropriate and Attentive  Affect:  Appropriate, Bright  Cognitive:  Alert and Appropriate  Insight:  Developing/Improving and Engaged  Engagement in Therapy:  Developing/Improving and Engaged  Modes of Intervention:  Clarification, Confrontation, Discussion, Education, Exploration, Limit-setting, Orientation, Problem-solving, Rapport Building, Art therapist, Socialization and Support  Summary of Progress/Problems: The main focus of today's process group was to identify the patient's current support system and decide on other supports that can be put in place.  An emphasis was placed on using counselor, doctor, therapy groups, 12-step groups, and problem-specific support groups to expand supports, as well as doing something different than has been done before.  Pt was an active participant of group discussion and very talkative, in an appropriate manner.  Pt shared that her family, NA/AA and [facility] [employer] will be her support system.  Pt discussed what she's learned about [employer] and encouraged peers to try it.  Pt processed her history of mental health and how it has taken until now to accept that she has a mental illness.  Pt discussed it being hard for her to accept but understands that she has to in order to move forward.  Pt discussed not liking to need for taking so many medications but understands why she has to.  Pt states that her week day SW mentioned someone being able to come in the home to help her with her meds.     Regan Lemming, LCSW 07/18/2014 1:44 PM

## 2014-07-18 NOTE — Progress Notes (Signed)
Adult Psychoeducational Group Note  Date:  07/18/2014 Time:  12:57 AM  Group Topic/Focus:  Wrap-Up Group:   The focus of this group is to help patients review their daily goal of treatment and discuss progress on daily workbooks.  Participation Level:  Active  Participation Quality:  Appropriate  Affect:  Appropriate  Cognitive:  Appropriate  Insight: Appropriate  Engagement in Group:  Engaged  Modes of Intervention:  Discussion  Additional Comments:  Pt stated she had a weird day today. She caught a cold and hasn't felt her best. Pt stated she wants to work on having an iron clad discharge plan when she leaves here.  Clint Bolder 07/18/2014, 12:57 AM

## 2014-07-18 NOTE — [facility] Group Notes (Signed)
[facility] Group Notes:  (Nursing/MHT/Case Management/Adjunct)  Date:  07/18/2014  Time:  12:57 PM  Type of Therapy:  Psychoeducational Skills  Participation Level:  Did Not Attend  Participation Quality:  Did Not Attend  Affect:  Did Not Attend  Cognitive:  Did Not Attend  Insight:  None  Engagement in Group:  Did Not Attend  Modes of Intervention:  Did Not Attend  Summary of Progress/Problems: Pt did not attend patient healthy coping skills group.   Benancio Deeds Shanta 07/18/2014, 12:57 PM

## 2014-07-18 NOTE — [facility] Group Notes (Signed)
[facility] Group Notes:  (Nursing/MHT/Case Management/Adjunct)  Date:  07/18/2014  Time:  12:53 PM  Type of Therapy:  Psychoeducational Skills  Participation Level:  Did Not Attend  Participation Quality:  Did Not Attend  Affect:  Did Not Attend  Cognitive:  Did Not Attend  Insight:  None  Engagement in Group:  Did Not Attend  Modes of Intervention:  Did Not Attend  Summary of Progress/Problems: Pt did not attend patient self inventory group.   Benancio Deeds Shanta 07/18/2014, 12:53 PM

## 2014-07-18 NOTE — Progress Notes (Signed)
D: Pt has depressed affect and mood.  Pt reports "I'm tired."  Pt reports her goal today was "not to be so depressed.  I went to the gym."  Pt denies SI/HI, denies auditory hallucinations.  Pt reports visual hallucinations of "people turning into snakes."  Pt reports she had a better appetite today.  Denies withdrawal symptoms.  Pt interacts with staff and peers appropriately.   A: Medications administered per order.  Met with pt 1:1 and offered support and encouragement.  Safety maintained.   R: Pt verbally contracts for safety.  She has been compliant with scheduled medications this shift.  Will continue to monitor and assess for safety.

## 2014-07-18 NOTE — Progress Notes (Signed)
Adult Psychoeducational Group Note  Date:  07/18/2014 Time:  11:08 PM  Group Topic/Focus:  Wrap-Up Group:   The focus of this group is to help patients review their daily goal of treatment and discuss progress on daily workbooks.  Participation Level:  Active  Participation Quality:  Appropriate and Attentive  Affect:  Appropriate  Cognitive:  Appropriate  Insight: Appropriate  Engagement in Group:  Engaged  Modes of Intervention:  Discussion  Additional Comments:  Pt stated she had a good day today, no complaints. Pt stated healthy supports she wants to add are her family, and NA/AA meetings. Pt stated tomorrow she is going to continue working on her discharge plans.  Clint Bolder 07/18/2014, 11:08 PM

## 2014-07-18 NOTE — Progress Notes (Signed)
D: Patient's mood and affect is depressed; anxious at times. She reported on the self inventory sheet that she's sleeping fair at night, appetite and ability to pay attention are both improving and energy level is low. Patient rates depression and feelings of hopelessness "6". She's in the dayroom this morning putting a puzzle together; no interaction with others. Patient voiced this morning that she is still having visual hallucinations of dangerous acts. In  adherence with all medications.  A: Support and encouragement provided to patient. Administered medications per ordering MD. Monitor Q15 minute checks for safety.  R: Patient receptive. Denies SI/HI/AH. Patient remains safe on the unit.

## 2014-07-18 NOTE — Plan of Care (Signed)
Problem: Diagnosis: Increased Risk For Suicide Attempt Goal: STG-Patient Will Attend All Groups On The Unit Outcome: Progressing Pt attended evening group on 07/18/13.

## 2014-07-19 ENCOUNTER — Other Ambulatory Visit: Payer: Self-pay

## 2014-07-19 MED ORDER — SIMETHICONE 80 MG PO CHEW
80.0000 mg | CHEWABLE_TABLET | Freq: Four times a day (QID) | ORAL | Status: DC | PRN
  Administered 2014-07-19: 80 mg via ORAL
  Filled 2014-07-19: qty 1

## 2014-07-19 MED ORDER — PANTOPRAZOLE SODIUM 20 MG PO TBEC
20.0000 mg | DELAYED_RELEASE_TABLET | Freq: Every day | ORAL | Status: DC
  Administered 2014-07-19 – 2014-07-20 (×2): 20 mg via ORAL
  Filled 2014-07-19 (×6): qty 1

## 2014-07-19 NOTE — Progress Notes (Addendum)
[facility] MD Progress Note  07/19/2014 6:14 PM Cheryl Thompson  MRN:  [id number] Subjective:  Cheryl Thompson is a 49 year old African American female who complains of chest discomfort with some numbness to left underarm.  Patient reports that pain started after eating lunch and has been located left upper chest.  Patient describes as burning session and is accompanied by some nausea.  Patient denies shortness of breath. Endorses some tingling down left arm. EKG ordered patient is noted to be in normal sinus rhythm.  Patient has a left axis deviation, right bundle branch block.  This is consistent with tracing from 11/2013.  Abnormal ECG but no changes since previous reading.  Patient abdomen appears slightly distended, and tender in RUQ.  Patient BS present tho hypoactive.   Diagnosis:  GI  Reflux    Psychiatric Specialty Exam: Physical Exam  Nursing note and vitals reviewed. Constitutional: She is oriented to person, place, and time. She appears well-developed and well-nourished. No distress.  HENT:  Head: Normocephalic and atraumatic.  Cardiovascular: Normal rate, regular rhythm and normal heart sounds.  Exam reveals no gallop and no friction rub.   No murmur heard. Respiratory: Effort normal and breath sounds normal. No respiratory distress. She exhibits tenderness.  GI: Soft. She exhibits distension. There is no splenomegaly or hepatomegaly. There is tenderness (right upper quadrant tenderness ) in the right upper quadrant and periumbilical area. There is no rigidity, no rebound, no guarding, no tenderness at McBurney's point and negative Murphy's sign.    Neurological: She is alert and oriented to person, place, and time.  Skin: Skin is warm and dry.    Review of Systems  Respiratory: Positive for cough and sputum production. Negative for shortness of breath.   Cardiovascular: Positive for chest pain.  Gastrointestinal: Positive for heartburn and abdominal pain.    Blood pressure 143/88,  pulse 96, temperature 97.6 F (36.4 C), temperature source Oral, resp. rate 18, height 5\' 9"  (1.753 m), weight 58.968 kg (130 lb), SpO2 100 %.Body mass index is 19.19 kg/(m^2).  Sleep:  Number of Hours: 6.25   Musculoskeletal: Strength & Muscle Tone: within normal limits Gait & Station: normal Patient leans: N/A  Current Medications: Current Facility-Administered Medications  Medication Dose Route Frequency Provider Last Rate Last Dose  . alum & mag hydroxide-simeth (MAALOX/MYLANTA) 200-200-20 MG/5ML suspension 30 mL  30 mL Oral Q4H PRN Waylan Boga, NP   30 mL at 07/19/14 1508  . amLODipine (NORVASC) tablet 5 mg  5 mg Oral Daily Waylan Boga, NP   5 mg at 07/18/14 0810  . haloperidol (HALDOL) tablet 5 mg  5 mg Oral BH-qamhs Saramma Eappen, MD   5 mg at 07/19/14 0807   And  . benztropine (COGENTIN) tablet 0.5 mg  0.5 mg Oral BH-qamhs Saramma Eappen, MD   0.5 mg at 07/19/14 0807  . bisacodyl (DULCOLAX) EC tablet 5 mg  5 mg Oral Daily PRN Waylan Boga, NP      . Darunavir Ethanolate (PREZISTA) tablet 800 mg  800 mg Oral Q breakfast Waylan Boga, NP   800 mg at 07/19/14 0807  . emtricitabine-tenofovir (TRUVADA) 200-300 MG per tablet 1 tablet  1 tablet Oral Daily Waylan Boga, NP   1 tablet at 07/19/14 0807  . gabapentin (NEURONTIN) capsule 100 mg  100 mg Oral TID Ursula Alert, MD   100 mg at 07/19/14 1658  . hydrochlorothiazide (HYDRODIURIL) tablet 25 mg  25 mg Oral Daily Waylan Boga, NP   25  mg at 07/18/14 0810  . lamoTRIgine (LAMICTAL) tablet 25 mg  25 mg Oral Daily Ursula Alert, MD   25 mg at 07/18/14 0811  . levETIRAcetam (KEPPRA) tablet 500 mg  500 mg Oral BID Waylan Boga, NP   500 mg at 07/19/14 1658  . levothyroxine (SYNTHROID, LEVOTHROID) tablet 25 mcg  25 mcg Oral QAC breakfast Waylan Boga, NP   25 mcg at 07/19/14 0617  . lidocaine (LIDODERM) 5 % 1 patch  1 patch Transdermal PRN Ursula Alert, MD      . loratadine (CLARITIN) tablet 10 mg  10 mg Oral Daily Benjamine Mola, FNP    10 mg at 07/19/14 0807  . magnesium hydroxide (MILK OF MAGNESIA) suspension 30 mL  30 mL Oral Daily PRN Waylan Boga, NP      . multivitamin with minerals tablet 1 tablet  1 tablet Oral Daily Ursula Alert, MD   1 tablet at 07/19/14 0807  . MUSCLE RUB CREA   Topical PRN Ursula Alert, MD      . pantoprazole (PROTONIX) EC tablet 20 mg  20 mg Oral Daily Kennedy Bucker, NP      . ritonavir (NORVIR) tablet 100 mg  100 mg Oral Q breakfast Waylan Boga, NP   100 mg at 07/19/14 0807  . simethicone (MYLICON) chewable tablet 80 mg  80 mg Oral Q6H PRN Kennedy Bucker, NP      . sodium chloride (OCEAN) 0.65 % nasal spray 1 spray  1 spray Each Nare Q4H PRN Benjamine Mola, FNP   1 spray at 07/18/14 [phone]  . thiamine (VITAMIN B-1) tablet 100 mg  100 mg Oral Daily Ursula Alert, MD   100 mg at 07/19/14 0807  . traMADol (ULTRAM) tablet 50 mg  50 mg Oral Q12H PRN Ursula Alert, MD   50 mg at 07/19/14 1700  . traZODone (DESYREL) tablet 100 mg  100 mg Oral QHS Ursula Alert, MD   100 mg at 07/18/14 2106    Lab Results: No results found for this or any previous visit (from the past 48 hour(s)).  Physical Findings: AIMS: Facial and Oral Movements Muscles of Facial Expression: None, normal Lips and Perioral Area: None, normal Jaw: None, normal Tongue: None, normal,Extremity Movements Upper (arms, wrists, hands, fingers): None, normal Lower (legs, knees, ankles, toes): None, normal, Trunk Movements Neck, shoulders, hips: None, normal, Overall Severity Severity of abnormal movements (highest score from questions above): None, normal Incapacitation due to abnormal movements: None, normal Patient's awareness of abnormal movements (rate only patient's report): No Awareness, Dental Status Current problems with teeth and/or dentures?: No Does patient usually wear dentures?: No  CIWA:  CIWA-Ar Total: 0 COWS:     Treatment Plan Summary: Daily contact with patient to assess and evaluate symptoms and progress in  treatment Medication management protonic 20mg  daily ordered for reflux  Plan:  Started Protonix 20mg  daily for acid reflux.  Mylicon 80mg  tabled every 6 hours as needed for abdominal distention.   Medical Decision Making Problem Points:  New problem, with no additional work-up planned (3) Data Points:  Decision to obtain old records (1) Review or order medicine tests (1) Review of medication regiment & side effects (2) Review of new medications or change in dosage (2)  I certify that inpatient services furnished can reasonably be expected to improve the patient's condition.   Kennedy Bucker  PMH-NP  07/19/2014, 6:14 PM  Case reviewed with me as above

## 2014-07-19 NOTE — [facility] Group Notes (Signed)
[facility] LCSW Aftercare Discharge Planning Group Note   07/19/2014 11:11 AM  Participation Quality:  Appropriate   Mood/Affect:  Appropriate  Depression Rating:  5 "lower than normal"  Anxiety Rating:  5 "lower than normal"  Thoughts of Suicide:  No Will you contract for safety?   NA  Current AVH:  No  Plan for Discharge/Comments:  Pt reports that she spoke with her brother and sister over the weekend and worked things out with them. She plans to return home with her sister at d/c and has appt for orientation at [facility] for [facility] that she made on her own behalf. Pt also contacted NA and got someone who can take her to meetings and pick her up from the hospital at d/c (likely d/c tomorrow/Tues 07/20/14). Pt requesting ACT but has no medicaid. CSW assessing. Pt stated she will feel comfortable continuing med management and therapy at [facility] "my brother said he would help me take my meds." Pt reports no withdrawal symptoms.   Transportation Means: NA person or bus   Supports: siblings, some family, NA people  Smart, [place]

## 2014-07-19 NOTE — Progress Notes (Signed)
D Pt. Denies SI and HI, no complaints of pain or discomfort noted at this time.   A Writer offered support and encouragement, discussed coping skills with pt.  R Pt. Remains safe on the unit,  Rates her depression a 6, states she has coping skills that she will use when she is discharged home tomorrow which inlclude going to meetings and taking her meds as scheduled.

## 2014-07-19 NOTE — Progress Notes (Addendum)
D: Pt presents with flat affect and depressed mood. Pt rates depression 5/10. Pt stated that she continues to feel depressed d/t thinking about her mom who recently passed away. Pt rates anxiety 6/10. Hopeless 5/10. Pt reports good appetite. Normal energy level. Pt reports difficulty sleeping d/t having intense nightmares last night. Per pt, she is not sure if she is seeing things today because she is use to it now but it is better than before with medicine. Pt compliant with attending groups and taking meds. A: Medications administered as ordered per MD. Verbal support given. Pt encouraged to attend groups. 15 minute checks performed for safety.  R: Pt stated goal is to attend groups, talk to Education officer, museum and MD. Pt receptive to treatment.

## 2014-07-19 NOTE — Progress Notes (Signed)
Pt c/o chest pain this evening at 1700. Writer made MD aware at time of pt complaint. MD recommended EKG.  EKG performed by Probation officer and given to [place], NP. Pt stated that she may be having gas or indigestion. Pt also complained of left arm numbness. Pt assessed by Darrick Penna, NP. Pt v/s wnl. Darrick Penna, NP ordered meds. Writer will administer med once verified by pharmacy.

## 2014-07-19 NOTE — Progress Notes (Signed)
Adult Psychoeducational Group Note  Date:  07/19/2014 Time:  8:42 PM  Group Topic/Focus:  Wrap-Up Group:   The focus of this group is to help patients review their daily goal of treatment and discuss progress on daily workbooks.  Participation Level:  Active  Participation Quality:  Attentive  Affect:  Appropriate  Cognitive:  Appropriate  Insight: Appropriate  Engagement in Group:  Engaged  Modes of Intervention:  Discussion  Additional Comments:  Pt goal was to engage in her d/c planning which was accomplished today by being d/c tomorrow home.  Quentin Angst 07/19/2014, 8:42 PM

## 2014-07-19 NOTE — Progress Notes (Signed)
Adult Psychoeducational Group Note  Date:  07/19/2014 Time:  1:44 PM  Group Topic/Focus:  Wellness Toolbox:   The focus of this group is to discuss various aspects of wellness, balancing those aspects and exploring ways to increase the ability to experience wellness.  Patients will create a wellness toolbox for use upon discharge.  Participation Level:  Active  Participation Quality:  Appropriate and Attentive  Affect:  Appropriate  Cognitive:  Alert and Appropriate  Insight: Appropriate  Engagement in Group:  Engaged  Modes of Intervention:  Education and Support  Additional Comments:  Pt participated in group. Pt stated that she needs to stop using drugs and stay sober to maintain her wellness.  Kathlen [place], Kataleya Zaugg 07/19/2014, 1:44 PM

## 2014-07-19 NOTE — [facility] Group Notes (Signed)
[facility] LCSW Group Therapy  07/19/2014 1:36 PM  Type of Therapy:  Group Therapy  Participation Level:  Active  Participation Quality:  Attentive  Affect:  Appropriate  Cognitive:  Alert and Oriented  Insight:  Improving  Engagement in Therapy:  Improving  Modes of Intervention:  Confrontation, Discussion, Education, Exploration, Problem-solving, Rapport Building, Socialization and Support  Summary of Progress/Problems: Today's Topic: Overcoming Obstacles. Pt identified obstacles faced currently and processed barriers involved in overcoming these obstacles. Pt identified steps necessary for overcoming these obstacles and explored motivation (internal and external) for facing these difficulties head on. Pt further identified one area of concern in their lives and chose a skill of focus pulled from their "toolbox." Cheryl Thompson shared that she is working on "grieving my mother's death" currently and learning how to be alone during the day. Cheryl Thompson shared that she overcame the obstacle of "drugs and drinking by going to [employer], church, and NA. There's a lot to be said for going to support groups. It's really helped me and it can help me again now that I've accepted that I have a mental illness." Cheryl Thompson shared that she scheduled orientation for the [facility] at the [place] and plans to attend mental health support groups. She offered positive advice and feedback to other group members and presented as attentive and pleasant during group.    Smart, Annabeth Tortora LCSWA 07/19/2014, 1:36 PM

## 2014-07-19 NOTE — Progress Notes (Addendum)
Patient ID: Cheryl Thompson, female   DOB: 21-Sep-1965, 49 y.o.   MRN: [id number] [facility] MD Progress Note  07/19/2014 1:55 PM Cheryl Thompson  MRN:  [id number]   Subjective: Patient states she is starting to feel better, and is hoping for discharge soon. She states she is tolerating medications well and at this time is not endorsing medication side effects.  Objective:  Chart reviewed and patient seen.  Patient reports improvement, and states she feels less anxious. She is also less depressed, although states she still is still grieving the recent death of her mother, who died in early 2014-07-12. She denies any suicidal ideations.  She relapsed recently, following the death of her mother, after a 35 year period of sobriety. At this time denies any cravings for alcohol or cocaine- substances of choice, and states she plans to go to [employer] and continue church activities that have helped her remain sober. She is tolerating medications well. Of note, patient has a history of Seizure Disorder, but has not had any seizures in several weeks. No disruptive behaviors on unit- has been going to groups.    .  Diagnosis:   DSM5: Primary Psychiatric Diagnosis: Bipolar disorder,type I ,most recent episode depressed ,severe with psychosis   Secondary Psychiatric Diagnosis: Bereavement  Stimulant use disorder (cocaine) severe  Cannabis use disorder,severe Alcohol use disorder,severe   Non Psychiatric Diagnosis: HIV Seizure disorder Hepatitis C Arthritis Gout  Total Time spent with patient: 25 minutes   ADL's:  Intact  Sleep: Improving  Appetite:  Improving  Psychiatric Specialty Exam: Physical Exam  Psychiatric: Her speech is normal. Her mood appears anxious. Thought content is paranoid. Thought content is not delusional. Cognition and memory are normal. She exhibits a depressed mood. She expresses no homicidal and no suicidal ideation.    Review of Systems  Gastrointestinal:  Negative for nausea, vomiting, abdominal pain and diarrhea.  Neurological: Positive for tremors (Improving).  Psychiatric/Behavioral: Positive for depression and substance abuse (ETOH). Negative for hallucinations and memory loss. Suicidal ideas: Denies at this time. The patient is nervous/anxious and has insomnia.   All other systems reviewed and are negative.   Blood pressure 118/92, pulse 92, temperature 97.6 F (36.4 C), temperature source Oral, resp. rate 18, height 5\' 9"  (1.753 m), weight 58.968 kg (130 lb), SpO2 100 %.Body mass index is 19.19 kg/(m^2).  General Appearance: Well Groomed  Engineer, water::  Good  Speech:  Normal Rate  Volume:  Normal  Mood:  states she is feeling better, less depressed  Affect:  Constricted and but fully reactive, smiles appropriately during session  Thought Process:  Goal Directed  Orientation:  Full (Time, Place, and Person)  Thought Content:  denies hallucinations, no delusions  Suicidal Thoughts:  No At this time denies any suicidal plan or intent and contracts for safety on the unit  Homicidal Thoughts:  No  Memory:  Recent and remote grossly intact   Judgement:  Fair  Insight:  Fair  Psychomotor Activity:  Normal- no psychomotor agitation  Concentration:  Good  Recall:  Good  Fund of Knowledge:Good  Language: Good  Akathisia:  No  Handed:  Right  AIMS (if indicated):   0 (07/17/14)  Assets:  Social Support  Sleep:  Number of Hours: 6.25   Musculoskeletal: Strength & Muscle Tone: within normal limits Gait & Station: normal Patient leans: N/A  Current Medications: Current Facility-Administered Medications  Medication Dose Route Frequency Provider Last Rate Last Dose  . alum &  mag hydroxide-simeth (MAALOX/MYLANTA) 200-200-20 MG/5ML suspension 30 mL  30 mL Oral Q4H PRN Waylan Boga, NP      . amLODipine (NORVASC) tablet 5 mg  5 mg Oral Daily Waylan Boga, NP   5 mg at 07/18/14 0810  . haloperidol (HALDOL) tablet 5 mg  5 mg Oral BH-qamhs  Saramma Eappen, MD   5 mg at 07/19/14 0807   And  . benztropine (COGENTIN) tablet 0.5 mg  0.5 mg Oral BH-qamhs Saramma Eappen, MD   0.5 mg at 07/19/14 0807  . bisacodyl (DULCOLAX) EC tablet 5 mg  5 mg Oral Daily PRN Waylan Boga, NP      . Darunavir Ethanolate (PREZISTA) tablet 800 mg  800 mg Oral Q breakfast Waylan Boga, NP   800 mg at 07/19/14 0807  . emtricitabine-tenofovir (TRUVADA) 200-300 MG per tablet 1 tablet  1 tablet Oral Daily Waylan Boga, NP   1 tablet at 07/19/14 0807  . gabapentin (NEURONTIN) capsule 100 mg  100 mg Oral TID Ursula Alert, MD   100 mg at 07/19/14 1206  . hydrochlorothiazide (HYDRODIURIL) tablet 25 mg  25 mg Oral Daily Waylan Boga, NP   25 mg at 07/18/14 0810  . lamoTRIgine (LAMICTAL) tablet 25 mg  25 mg Oral Daily Ursula Alert, MD   25 mg at 07/18/14 0811  . levETIRAcetam (KEPPRA) tablet 500 mg  500 mg Oral BID Waylan Boga, NP   500 mg at 07/19/14 0807  . levothyroxine (SYNTHROID, LEVOTHROID) tablet 25 mcg  25 mcg Oral QAC breakfast Waylan Boga, NP   25 mcg at 07/19/14 0617  . lidocaine (LIDODERM) 5 % 1 patch  1 patch Transdermal PRN Ursula Alert, MD      . loratadine (CLARITIN) tablet 10 mg  10 mg Oral Daily Benjamine Mola, FNP   10 mg at 07/19/14 0807  . magnesium hydroxide (MILK OF MAGNESIA) suspension 30 mL  30 mL Oral Daily PRN Waylan Boga, NP      . multivitamin with minerals tablet 1 tablet  1 tablet Oral Daily Ursula Alert, MD   1 tablet at 07/19/14 0807  . MUSCLE RUB CREA   Topical PRN Ursula Alert, MD      . ritonavir (NORVIR) tablet 100 mg  100 mg Oral Q breakfast Waylan Boga, NP   100 mg at 07/19/14 0807  . sodium chloride (OCEAN) 0.65 % nasal spray 1 spray  1 spray Each Nare Q4H PRN Benjamine Mola, FNP   1 spray at 07/18/14 [phone]  . thiamine (VITAMIN B-1) tablet 100 mg  100 mg Oral Daily Ursula Alert, MD   100 mg at 07/19/14 0807  . traMADol (ULTRAM) tablet 50 mg  50 mg Oral Q12H PRN Ursula Alert, MD      . traZODone (DESYREL) tablet  100 mg  100 mg Oral QHS Ursula Alert, MD   100 mg at 07/18/14 2106    Lab Results:  No results found for this or any previous visit (from the past 48 hour(s)).  Physical Findings: AIMS: Facial and Oral Movements Muscles of Facial Expression: None, normal Lips and Perioral Area: None, normal Jaw: None, normal Tongue: None, normal,Extremity Movements Upper (arms, wrists, hands, fingers): None, normal Lower (legs, knees, ankles, toes): None, normal, Trunk Movements Neck, shoulders, hips: None, normal, Overall Severity Severity of abnormal movements (highest score from questions above): None, normal Incapacitation due to abnormal movements: None, normal Patient's awareness of abnormal movements (rate only patient's report): No Awareness, Dental Status Current problems with  teeth and/or dentures?: No Does patient usually wear dentures?: No  CIWA:  CIWA-Ar Total: 0 COWS:      Assessment: Patient's decompensation was related to recent death of her mother, who passed away in early 07-30-2023. At this time patient improved, with improved mood and  Fuller range of affect. No SI at this time. Tolerating medications well at this time.  Treatment Plan Summary: Daily contact with patient to assess and evaluate symptoms and progress in treatment Medication management  Plan: Consider discharge soon as she continues to improve Haldol 5 mg po BID Cogentin 0.5 mgrs BID Lamictal 25 mg po QDAY Trazodone  100 mg po QHS for insomnia  Gabapentin 100 mg po  TID   On Synthroid, which was recently started , on anti-seizure medication ( Keppra)  and on Antiretrovirals .    Medical Decision Making Problem Points:  Established problem, stable/improving (1), Review of last therapy session (1) and Review of psycho-social stressors (1) Data Points:  Review or order clinical lab tests (1) Review of medication regiment & side effects (2)  I certify that inpatient services furnished can reasonably be  expected to improve the patient's condition.   COBOS, FERNANDO,  07/19/2014, 1:55 PM

## 2014-07-20 ENCOUNTER — Ambulatory Visit: Payer: Self-pay | Admitting: Internal Medicine

## 2014-07-20 MED ORDER — LAMOTRIGINE 25 MG PO TABS: 25.0000 mg | ORAL_TABLET | Freq: Every day | ORAL | Status: DC

## 2014-07-20 MED ORDER — LIDOCAINE 5 % EX PTCH: 1.0000 | MEDICATED_PATCH | CUTANEOUS | Status: DC | PRN

## 2014-07-20 MED ORDER — TRAZODONE HCL 100 MG PO TABS: 100.0000 mg | ORAL_TABLET | Freq: Every day | ORAL | Status: DC

## 2014-07-20 MED ORDER — PANTOPRAZOLE SODIUM 20 MG PO TBEC: 20.0000 mg | DELAYED_RELEASE_TABLET | Freq: Every day | ORAL | Status: DC

## 2014-07-20 MED ORDER — BENZTROPINE MESYLATE 0.5 MG PO TABS: 0.5000 mg | ORAL_TABLET | ORAL | Status: DC

## 2014-07-20 MED ORDER — HALOPERIDOL 5 MG PO TABS: 5.0000 mg | ORAL_TABLET | ORAL | Status: DC

## 2014-07-20 MED ORDER — ADULT MULTIVITAMIN W/MINERALS CH: 1.0000 | ORAL_TABLET | Freq: Every day | ORAL | Status: DC

## 2014-07-20 NOTE — [facility] Suicide Risk Assessment (Signed)
[facility] INPATIENT:  Family/Significant Other Suicide Prevention Education  Suicide Prevention Education:  Patient Refusal for Family/Significant Other Suicide Prevention Education: The patient Cheryl Thompson has refused to provide written consent for family/significant other to be provided Family/Significant Other Suicide Prevention Education during admission and/or prior to discharge.  Physician notified.  SPE completed with pt. SPI pamphlet provided to pt and she was strongly encouraged to share this information with her sister/family/supports, to ask questions, and to talk about any concerns regarding SPI. Pt reports no access to firearms and stated that her brother will assist her with taking medications at home.   Smart, Jayven Naill LCSWA  07/20/2014, 10:01 AM

## 2014-07-20 NOTE — Discharge Summary (Signed)
Physician Discharge Summary Note  Patient:  Cheryl Thompson is an 49 y.o., female MRN:  [id number] DOB:  01/18/1966 Patient phone:  [phone] (home)  Patient address:   [place] [address],  Total Time spent with patient: 30 minutes  Date of Admission:  07/15/2014 Date of Discharge: 07/20/14  Reason for Admission:  Mood stabilization treatments  Discharge Diagnoses: Principal Problem:   Alcohol use disorder, severe, dependence Active Problems:   Bipolar affective disorder, depressed, severe   Cocaine use disorder, severe, dependence   Cannabis use disorder, severe, dependence  Psychiatric Specialty Exam: Physical Exam  Psychiatric: Her speech is normal and behavior is normal. Judgment and thought content normal. Her mood appears not anxious. Her affect is not angry, not blunt, not labile and not inappropriate. Cognition and memory are normal. She does not exhibit a depressed mood.    Review of Systems  Constitutional: Negative.   HENT: Negative.   Eyes: Negative.   Respiratory: Negative.   Cardiovascular: Negative.   Gastrointestinal: Negative.   Genitourinary: Negative.   Musculoskeletal: Negative.   Skin: Negative.   Neurological: Negative.   Endo/Heme/Allergies: Negative.   Psychiatric/Behavioral: Positive for depression (Stable) and substance abuse (Hx of). Negative for suicidal ideas, hallucinations and memory loss. The patient has insomnia (Stable). The patient is not nervous/anxious.     Blood pressure 126/89, pulse 96, temperature 97.7 F (36.5 C), temperature source Oral, resp. rate 20, height 5\' 9"  (1.753 m), weight 58.968 kg (130 lb), SpO2 100 %.Body mass index is 19.19 kg/(m^2).     Past Psychiatric History: Yes Diagnosis: Depression ,alcohol use disorder  Hospitalizations: [facility] -2012  Outpatient Care: Denies  Substance Abuse Care: [facility] in the past  Self-Mutilation: Denies  Suicidal Attempts: yes ,several times ,6 times   Violent Behaviors: Denies   Musculoskeletal: Strength & Muscle Tone: within normal limits Gait & Station: normal, unsteady Patient leans: N/A  DSM5:  Primary Psychiatric Diagnosis: Bipolar disorder,type I ,most recent episode depressed ,severe with psychosis (resolved acute phase)  Secondary Psychiatric Diagnosis: Bereavement  Stimulant use disorder (cocaine) severe  Cannabis use disorder,severe Alcohol use disorder,severe  Non Psychiatric Diagnosis: HIV Seizure disorder Hepatitis C Arthritis Gout   Past Medical History  Diagnosis Date  . HIV (human immunodeficiency virus infection)   . Hypertension   . Depression   . Seizures   . Acute alcoholic hepatitis   . Hepatitis C   . Diverticulitis y-1  . Diverticulosis y-1   Level of Care:  OP  Hospital Course:  Cheryl Thompson is an 49 y.o. female admitted to the [facility] Emergency Department with SI with a plan to kill herself and HI with a plan to kill her brother and sister. She states that she wants to wait until her siblings to go sleep and then "cut their heads off". She states that she has not been taking her medications like she is supposed to since the last time she was inpatient which was June 25, 2014-June 29, 2014. Pt has a long history of mental health issues starting in childhood. She states "I've wanted to kill myself since I was 49 years old". Pt states she used to drink clorox and other chemicals, cutting, pulling out hair ext. Pt states that she has been using alcohol for years "since she was 49 years old" and uses "2-3 fifths of liquor a day".  Pt also states that she uses cocaine and marijuana whenever she can. She states she is not  using these drugs for recreation but is using them to kill herself. She says she does not want to drink any more and knows that what she is doing is not helping her mental health status and that she wants to live but is afraid of what she will  do if she leaves. Pt also states that she is paranoid at times and thinks that everyone is "out to get her". She is also depressed because her mother passed early in December.         Cheryl Thompson was admitted to the adult unit where she was evaluated and her symptoms were identified. Medication management was discussed and implemented. Patient was restarted on Haldol and Cogentin, which were ordered on her last admission to [facility]. They were effectively controlling her symptoms but the patient did not take as prescribed after discharge. She was encouraged to participate in unit programming. Medical problems were identified and treated appropriately. Home medication was restarted as needed. Her prior to admission medications to treat chronic problems such as HIV, hypothyroidism and for seizures were all continued.  She was evaluated each day by a clinical provider to ascertain the patient's response to treatment.  Improvement was noted by the patient's report of decreasing symptoms, improved sleep and appetite, affect, medication tolerance, behavior, and participation in unit programming.  The patient was asked each day to complete a self inventory noting mood, mental status, pain, new symptoms, anxiety and concerns.         She responded well to medication and being in a therapeutic and supportive environment. Positive and appropriate behavior was noted and the patient was motivated for recovery.  She worked closely with the treatment team and case manager to develop a discharge plan with appropriate goals. Coping skills, problem solving as well as relaxation therapies were also part of the unit programming. Patient was noted to appear less depressed but continued to experience grief issues related to recent death of her mother.          By the day of discharge she was in much improved condition than upon admission.  Symptoms were reported as significantly decreased or resolved completely. The patient denied  SI/HI and voiced no AVH. She was motivated to continue taking medication with a goal of continued improvement in mental health.  Cheryl Thompson was discharged home with a plan to follow up as noted below. Patient was provided with sample medications and prescriptions. She left [facility] in stable condition with all belongings returned to her. Patient planned on going to [place] meetings and to continue church activities that helped her remain sober in the past.   Consults:  psychiatry  Significant Diagnostic Studies:  labs: CBC with diff, CMP, UDS, toxicology tests, U/A, results reviewed, stable  Discharge Vitals:   Blood pressure 126/89, pulse 96, temperature 97.7 F (36.5 C), temperature source Oral, resp. rate 20, height 5\' 9"  (1.753 m), weight 58.968 kg (130 lb), SpO2 100 %. Body mass index is 19.19 kg/(m^2). Lab Results:   No results found for this or any previous visit (from the past 72 hour(s)).  Physical Findings: AIMS: Facial and Oral Movements Muscles of Facial Expression: None, normal Lips and Perioral Area: None, normal Jaw: None, normal Tongue: None, normal,Extremity Movements Upper (arms, wrists, hands, fingers): None, normal Lower (legs, knees, ankles, toes): None, normal, Trunk Movements Neck, shoulders, hips: None, normal, Overall Severity Severity of abnormal movements (highest score from questions above): None, normal Incapacitation due to abnormal movements: None,  normal Patient's awareness of abnormal movements (rate only patient's report): No Awareness, Dental Status Current problems with teeth and/or dentures?: No Does patient usually wear dentures?: No  CIWA:  CIWA-Ar Total: 1 COWS:     Psychiatric Specialty Exam: See Psychiatric Specialty Exam and Suicide Risk Assessment completed by Attending Physician prior to discharge.  Discharge destination:  Home  Is patient on multiple antipsychotic therapies at discharge:  No   Has Patient had three or more failed trials of  antipsychotic monotherapy by history:  No  Recommended Plan for Multiple Antipsychotic Therapies: NA     Discharge Instructions    Discharge instructions    Complete by:  As directed   Please follow up with your Primary Care Provider for further management of medical problems as scheduled.            Medication List    STOP taking these medications        indomethacin 25 MG capsule  Commonly known as:  INDOCIN     oxyCODONE-acetaminophen 5-325 MG per tablet  Commonly known as:  PERCOCET/ROXICET      TAKE these medications      Indication   amLODipine 5 MG tablet  Commonly known as:  NORVASC  Take 1 tablet (5 mg total) by mouth daily. For high blood pressure   Indication:  High Blood Pressure     benztropine 0.5 MG tablet  Commonly known as:  COGENTIN  Take 1 tablet (0.5 mg total) by mouth 2 (two) times daily in the am and at bedtime..   Indication:  Extrapyramidal Reaction caused by Medications     cholecalciferol 1000 UNITS tablet  Commonly known as:  VITAMIN D  Take 1 tablet (1,000 Units total) by mouth every morning. For bone health   Indication:  Bone health     Darunavir Ethanolate 800 MG tablet  Commonly known as:  PREZISTA  Take 1 tablet (800 mg total) by mouth daily. For HIV infection   Indication:  HIV Disease     emtricitabine-tenofovir 200-300 MG per tablet  Commonly known as:  TRUVADA  Take 1 tablet by mouth daily. For HIV infection   Indication:  HIV Disease     gabapentin 100 MG capsule  Commonly known as:  NEURONTIN  Take 1 capsule (100 mg total) by mouth 3 (three) times daily. For agitation/pain   Indication:  Agitation, Pain     haloperidol 5 MG tablet  Commonly known as:  HALDOL  Take 1 tablet (5 mg total) by mouth 2 (two) times daily in the am and at bedtime..   Indication:  Mood control     hydrochlorothiazide 25 MG tablet  Commonly known as:  HYDRODIURIL  Take 1 tablet (25 mg total) by mouth daily. For high blood pressure    Indication:  High Blood Pressure     lamoTRIgine 25 MG tablet  Commonly known as:  LAMICTAL  Take 1 tablet (25 mg total) by mouth daily.   Indication:  Manic-Depression     levETIRAcetam 500 MG tablet  Commonly known as:  KEPPRA  Take 1 tablet (500 mg total) by mouth 2 (two) times daily. For seizure activities   Indication:  Seizure activities     levothyroxine 25 MCG tablet  Commonly known as:  SYNTHROID, LEVOTHROID  Take 1 tablet (25 mcg total) by mouth daily before breakfast. (Do not take wit other medications): For low thyroid function   Indication:  Underactive Thyroid     lidocaine 5 %  Commonly known as:  [place] 1 patch onto the skin as needed. Remove & Discard patch within 12 hours or as directed by MD   Indication:  Chronic pain     lisinopril 10 MG tablet  Commonly known as:  PRINIVIL,ZESTRIL  Take 1 tablet (10 mg total) by mouth daily. For high blood pressure   Indication:  High Blood Pressure     multivitamin with minerals Tabs tablet  Take 1 tablet by mouth daily.   Indication:  Vitamin Supplementation     omega-3 acid ethyl esters 1 G capsule  Commonly known as:  LOVAZA  Take 2 capsules (2 g total) by mouth 2 (two) times daily. For high cholesterol/fats   Indication:  High Amount of Triglycerides in the Blood     pantoprazole 20 MG tablet  Commonly known as:  PROTONIX  Take 1 tablet (20 mg total) by mouth daily.   Indication:  Gastroesophageal Reflux Disease     ritonavir 100 MG Tabs tablet  Commonly known as:  NORVIR  Take 1 tablet (100 mg total) by mouth daily. For HIV infection   Indication:  HIV Disease     traZODone 100 MG tablet  Commonly known as:  DESYREL  Take 1 tablet (100 mg total) by mouth at bedtime.   Indication:  Trouble Sleeping       Follow-up Information    Follow up with [facility] for Infectious Disease On 08/09/2014.   Why:  Appt scheduled on this date at 3:15PM   Contact information:   [address]  [address] [place], [place]  [address] Phone: [phone] Fax: [phone]      Follow up with [facility].   Why:  Walk in between 8am-9am Monday through Friday for hospital follow-up/medication management/assessment for therapy services.    Contact information:   [address] [place] [address] Phone: [phone] Fax: [phone]     Follow-up recommendations: Activity:  As tolerated Diet: As recommended by your primary care doctor. Keep all scheduled follow-up appointments as recommended.    Comments: Take all your medications as prescribed by your mental healthcare provider. Report any adverse effects and or reactions from your medicines to your outpatient provider promptly. Patient is instructed and cautioned to not engage in alcohol and or illegal drug use while on prescription medicines. In the event of worsening symptoms, patient is instructed to call the crisis hotline, 911 and or go to the nearest ED for appropriate evaluation and treatment of symptoms. Follow-up with your primary care provider for your other medical issues, concerns and or health care needs.  Total Discharge Time:  Greater than 30 minutes.  SignedElmarie Shiley, NP-C 07/20/2014, 11:46 AM

## 2014-07-20 NOTE — Tx Team (Signed)
Interdisciplinary Treatment Plan Update (Adult)   Date: 07/20/2014   Time Reviewed: 9:57 AM  Progress in Treatment:  Attending groups: Yes  Participating in groups: Yes    Taking medication as prescribed: Yes  Tolerating medication: Yes  Family/Significant othe contact made: No. Pt refused to give consent for family contact. SPE comleted with pt.  Patient understands diagnosis: Yes, AEB seeking treatment for depression/mood instability, SI with attempted overdose, ETOH detox/marijuana and crack use, and for medication stabilization.  Discussing patient identified problems/goals with staff: Yes  Medical problems stabilized or resolved: Yes  Denies suicidal/homicidal ideation: Yes during group/self report.  Patient has not harmed self or Others: Yes  New problem(s) identified:  Discharge Plan or Barriers: Pt plans to follow-up at [facility]--ACT not available/no IPRS funding.  Med appt rescheduled. Pt has appt with [facility] on Wed for orientation into [facility]. Pt plans to return home with her sister. Pt's NA friend will pick her up at d/c and transport her home.  Additional comments: Pt is a 49 year old female admitted with depression with Who reports overdosing on her medications but slept all day instead of dying She also reports not taking her medications and she has been drinking ETOH and using cocaine and pot She reports feeling achy and lacking an appetite And coughing and sneezing She is depressed and sad and reports grieving over her moms death When she first came in she was saying she wanted to hurt her brother and sister but is denying same now She denies needing a wheelchair and said she has gout in her knees and sometimes needs a cane when it is bothering her She lived at her moms house but now says that she is homeless and believes her bor and sis who she shares it with want her out  Reason for Continuation of Hospitalization: none  Estimated length of stay: d/c  today For review of initial/current patient goals, please see plan of care.  Attendees:  Patient:    Family:    Physician: Dr. Shea Evans MD 07/20/2014 9:57 AM   Nursing: Birdie Hopes RN 07/20/2014 9:57 AM   Clinical Social Worker [place], [facility]  07/20/2014 9:57 AM   Other: Ripley Fraise, LCSW 07/20/2014 9:57 AM   Other: Anderson Malta, Nurse CM 07/20/2014 9:57 AM   Other: Norberto Sorenson, Care Coordinator 07/20/2014 9:57AM  Other:    Scribe for Treatment Team:  Maxie Better [place] 07/20/2014 9:57 AM

## 2014-07-20 NOTE — Progress Notes (Signed)
Discharge note: Pt received both written and verbal discharge instructions. Pt verbalized understanding of discharge instructions. Pt agreed to f/u appt and med regimen. Pt received sample meds, prescriptions and belongings. Pt denies SI/HI.

## 2014-07-20 NOTE — [facility] Suicide Risk Assessment (Signed)
   Demographic Factors:  Low socioeconomic status  Total Time spent with patient: 45 minutes  Psychiatric Specialty Exam: Physical Exam  ROS  Blood pressure 126/89, pulse 96, temperature 97.7 F (36.5 C), temperature source Oral, resp. rate 20, height 5\' 9"  (1.753 m), weight 58.968 kg (130 lb), SpO2 100 %.Body mass index is 19.19 kg/(m^2).  General Appearance: Casual  Eye Contact::  Fair  Speech:  Clear and Coherent  Volume:  Normal  Mood:  Euthymic  Affect:  Congruent  Thought Process:  Coherent  Orientation:  Full (Time, Place, and Person)  Thought Content:  WDL  Suicidal Thoughts:  No  Homicidal Thoughts:  No  Memory:  Immediate;   Fair Recent;   Fair Remote;   Fair  Judgement:  Fair  Insight:  Fair  Psychomotor Activity:  Normal  Concentration:  Fair  Recall:  [employer] of Knowledge:Fair  Language: Fair  Akathisia:  No  Handed:  Right  AIMS (if indicated):     Assets:  Communication Skills Desire for Improvement  Sleep:  Number of Hours: 6.75    Musculoskeletal: Strength & Muscle Tone: within normal limits Gait & Station: normal Patient leans: N/A   Mental Status Per Nursing Assessment::   On Admission:     Current Mental Status by Physician: NA  Loss Factors: Loss of significant relationship and Decline in physical health  Historical Factors: Impulsivity  Risk Reduction Factors:   Living with another person, especially a relative, Positive social support and Positive coping skills or problem solving skills  Continued Clinical Symptoms:  Unstable or Poor Therapeutic Relationship Previous Psychiatric Diagnoses and Treatments Medical Diagnoses and Treatments/Surgeries  Cognitive Features That Contribute To Risk:  Polarized thinking    Suicide Risk:  Minimal: No identifiable suicidal ideation.   Discharge Diagnoses: DSM5: Primary Psychiatric Diagnosis: Bipolar disorder,type I ,most recent episode depressed ,severe with psychosis (resolved  acute phase)   Secondary Psychiatric Diagnosis: Bereavement  Stimulant use disorder (cocaine) severe  Cannabis use disorder,severe Alcohol use disorder,severe   Non Psychiatric Diagnosis: HIV Seizure disorder Hepatitis C Arthritis Gout    Past Medical History  Diagnosis Date  . HIV (human immunodeficiency virus infection)   . Hypertension   . Depression   . Seizures   . Acute alcoholic hepatitis   . Hepatitis C   . Diverticulitis y-1  . Diverticulosis y-1    Plan Of Care/Follow-up recommendations:  Activity:  NO RESTRICTIONS Diet:  REGULAR  Is patient on multiple antipsychotic therapies at discharge:  No   Has Patient had three or more failed trials of antipsychotic monotherapy by history:  No  Recommended Plan for Multiple Antipsychotic Therapies: NA    Lorrin Bodner md 07/20/2014, 9:33 AM

## 2014-07-20 NOTE — Progress Notes (Signed)
[facility] Adult Case Management Discharge Plan :  Will you be returning to the same living situation after discharge: Yes,  home with siblings At discharge, do you have transportation home?:Yes,  Friend from NA picking her up this afternoon Do you have the ability to pay for your medications:Yes,  Mental health  Release of information consent forms completed and submitted to Medical Records by CSW.  Patient to Follow up at: Follow-up Information    Follow up with [facility] for Infectious Disease On 08/09/2014.   Why:  Appt scheduled on this date at 3:15PM   Contact information:   [address] [place], [place] [address] Phone: [phone] Fax: [phone]      Follow up with [facility].   Why:  Walk in between 8am-9am Monday through Friday for hospital follow-up/medication management/assessment for therapy services.    Contact information:   [address], [place] [address] Phone: [phone] Fax: [phone]      Patient denies SI/HI:   Yes,  during group/self report.     Safety Planning and Suicide Prevention discussed:  Yes,  SPE completed with pt, as she refused to consent to family contact. SPI pamphlet provided to pt and she was encouraged to share with family.   Patient refused referral  Smart, Alicia Amel  07/20/2014, 10:02 AM

## 2014-07-20 NOTE — [facility] Group Notes (Signed)
[facility] Group Notes:  (Nursing/MHT/Case Management/Adjunct)  Date:  07/20/2014  Time:  9:30am  Type of Therapy:  Nurse Education - Recovery Goals  Participation Level:  Active  Participation Quality:  Appropriate  Affect:  Appropriate  Cognitive:  Appropriate  Insight:  Good  Engagement in Group:  Engaged  Modes of Intervention:  Discussion, Education and Support  Summary of Progress/Problems: Patient was actively involved in the group's discussion regarding ways to succeed in recovery. She offered support and suggestions to peers as well. States her goal is go to an NA meeting tonight at 6:30 (pt is discharging) and she has already made contact with a sponsor.   Jamie Kato 07/20/2014, 2:45 PM

## 2014-07-21 NOTE — Progress Notes (Signed)
Patient Discharge Instructions:  After Visit Summary (AVS):   Faxed to:  07/21/14 Discharge Summary Note:   Faxed to:  07/21/14 Psychiatric Admission Assessment Note:   Faxed to:  07/21/14 Suicide Risk Assessment - Discharge Assessment:   Faxed to:  07/21/14 Faxed/Sent to the Next Level Care provider:  07/21/14 Next Level Care Provider Has Access to the EMR, 07/21/13 Faxed to [facility] @ [phone] Records provided to [facility] via [facility]/Epic access.  Patsey Berthold, 07/21/2014, 3:57 PM

## 2014-07-23 ENCOUNTER — Other Ambulatory Visit: Payer: Self-pay

## 2014-07-23 ENCOUNTER — Emergency Department (HOSPITAL_COMMUNITY)
Admission: EM | Admit: 2014-07-23 | Discharge: 2014-07-23 | Disposition: A | Discharge status: Home / Self Care | Payer: Self-pay | Attending: Emergency Medicine | Admitting: Emergency Medicine

## 2014-07-23 ENCOUNTER — Encounter (HOSPITAL_COMMUNITY): Payer: Self-pay | Admitting: Emergency Medicine

## 2014-07-23 ENCOUNTER — Ambulatory Visit (INDEPENDENT_AMBULATORY_CARE_PROVIDER_SITE_OTHER): Payer: Federal, State, Local not specified - Other | Admitting: Family Medicine

## 2014-07-23 ENCOUNTER — Emergency Department (HOSPITAL_COMMUNITY): Payer: Self-pay

## 2014-07-23 DIAGNOSIS — J069 Acute upper respiratory infection, unspecified: Secondary | ICD-10-CM

## 2014-07-23 DIAGNOSIS — R1110 Vomiting, unspecified: Secondary | ICD-10-CM | POA: Insufficient documentation

## 2014-07-23 DIAGNOSIS — E079 Disorder of thyroid, unspecified: Secondary | ICD-10-CM | POA: Insufficient documentation

## 2014-07-23 DIAGNOSIS — Z8619 Personal history of other infectious and parasitic diseases: Secondary | ICD-10-CM | POA: Insufficient documentation

## 2014-07-23 DIAGNOSIS — Z79899 Other long term (current) drug therapy: Secondary | ICD-10-CM | POA: Insufficient documentation

## 2014-07-23 DIAGNOSIS — I10 Essential (primary) hypertension: Secondary | ICD-10-CM | POA: Insufficient documentation

## 2014-07-23 DIAGNOSIS — R0981 Nasal congestion: Secondary | ICD-10-CM

## 2014-07-23 DIAGNOSIS — G40909 Epilepsy, unspecified, not intractable, without status epilepticus: Secondary | ICD-10-CM | POA: Insufficient documentation

## 2014-07-23 DIAGNOSIS — Z8719 Personal history of other diseases of the digestive system: Secondary | ICD-10-CM | POA: Insufficient documentation

## 2014-07-23 DIAGNOSIS — Z21 Asymptomatic human immunodeficiency virus [HIV] infection status: Secondary | ICD-10-CM | POA: Insufficient documentation

## 2014-07-23 DIAGNOSIS — B9789 Other viral agents as the cause of diseases classified elsewhere: Secondary | ICD-10-CM

## 2014-07-23 DIAGNOSIS — R079 Chest pain, unspecified: Secondary | ICD-10-CM | POA: Insufficient documentation

## 2014-07-23 DIAGNOSIS — Z720 Tobacco use: Secondary | ICD-10-CM | POA: Insufficient documentation

## 2014-07-23 DIAGNOSIS — Z880 Allergy status to penicillin: Secondary | ICD-10-CM | POA: Insufficient documentation

## 2014-07-23 DIAGNOSIS — Z8659 Personal history of other mental and behavioral disorders: Secondary | ICD-10-CM | POA: Insufficient documentation

## 2014-07-23 HISTORY — DX: Disorder of thyroid, unspecified: E07.9

## 2014-07-23 MED ORDER — AEROCHAMBER PLUS W/MASK MISC
1.0000 | Freq: Once | Status: AC
  Administered 2014-07-23: 1
  Filled 2014-07-23: qty 1

## 2014-07-23 MED ORDER — ACETAMINOPHEN 500 MG PO TABS
1000.0000 mg | ORAL_TABLET | Freq: Once | ORAL | Status: AC
  Administered 2014-07-23: 1000 mg via ORAL
  Filled 2014-07-23: qty 2

## 2014-07-23 MED ORDER — ONDANSETRON 8 MG PO TBDP
8.0000 mg | ORAL_TABLET | Freq: Once | ORAL | Status: AC
  Administered 2014-07-23: 8 mg via ORAL
  Filled 2014-07-23: qty 1

## 2014-07-23 MED ORDER — ALBUTEROL SULFATE HFA 108 (90 BASE) MCG/ACT IN AERS
2.0000 | INHALATION_SPRAY | Freq: Once | RESPIRATORY_TRACT | Status: AC
  Administered 2014-07-23: 2 via RESPIRATORY_TRACT
  Filled 2014-07-23: qty 6.7

## 2014-07-23 MED ORDER — BENZONATATE 100 MG PO CAPS: 100.0000 mg | ORAL_CAPSULE | Freq: Three times a day (TID) | ORAL | Status: DC

## 2014-07-23 MED ORDER — ACETAMINOPHEN 500 MG PO TABS: 500.0000 mg | ORAL_TABLET | Freq: Four times a day (QID) | ORAL | Status: DC | PRN

## 2014-07-23 MED ORDER — BENZONATATE 100 MG PO CAPS
200.0000 mg | ORAL_CAPSULE | Freq: Once | ORAL | Status: AC
  Administered 2014-07-23: 200 mg via ORAL
  Filled 2014-07-23: qty 2

## 2014-07-23 NOTE — Progress Notes (Signed)
Patient scheduled an acute appointment. She did not show for appointment and it currently being treated in the emergency department.    [name], FNP

## 2014-07-23 NOTE — ED Provider Notes (Signed)
CSN: [id number]     Arrival date & time 07/23/14  1317 History   First MD Initiated Contact with Patient 07/23/14 1534     Chief Complaint  Patient presents with  . Cough  . Generalized Body Aches     (Consider location/radiation/quality/duration/timing/severity/associated sxs/prior Treatment) The history is provided by the patient and medical records. No language interpreter was used.     Cheryl Thompson is a 49 y.o. female  with a hx of HIV (last CD4 count 1070 on 03-16-14, pt reports compliance with meds), polysubstance abuse, hypertension, depression, seizures, alcoholic hepatitis, hepatitis C, thyroid disease presents to the Emergency Department complaining of gradual, persistent, progressively worsening URI symptoms onset this morning at 8 AM.  Patient reports she awoke with sore throat, sinus congestion, mild generalized throbbing headache, cough, burning sensation in her chest. Patient reports one episode of emesis last night with mild nausea today but no further episodes of emesis and no diarrhea. Patient reports she is followed by Dr [name] [facility] and has been compliant with all her medications.  No associated symptoms. No aggravating or alleviating factors.  Patient denies fever, chills, pain, neck stiffness, shortness of breath, abdominal pain, arrhythmia, weakness, syncope, dysuria, hematuria.  Past Medical History  Diagnosis Date  . HIV (human immunodeficiency virus infection)   . Hypertension   . Depression   . Seizures   . Acute alcoholic hepatitis   . Hepatitis C   . Diverticulitis y-1  . Diverticulosis y-1  . Thyroid disease    Past Surgical History  Procedure Laterality Date  . Total abdominal hysterectomy w/ bilateral salpingoophorectomy  2006  . Right knee surgery      around 49 years old, for ? chronic dislocation  . Abdominal hysterectomy     Family History  Problem Relation Age of Onset  . Drug abuse Mother   . Diabetes Sister   .  Diabetes Maternal Aunt   . Hypertension Maternal Aunt   . Hyperlipidemia Maternal Aunt   . Stroke Maternal Grandmother   . Hypertension Maternal Grandmother   . Diabetes Maternal Grandmother   . Stroke Maternal Grandfather   . Alcohol abuse Maternal Grandfather    History  Substance Use Topics  . Smoking status: Current Every Day Smoker -- 0.10 packs/day    Types: Cigarettes  . Smokeless tobacco: Never Used  . Alcohol Use: 0.0 oz/week     Comment: pt reports she has been drinking "everything" since 06/16/14.  Over 9 drinks a day   OB History    No data available     Review of Systems  Constitutional: Positive for fatigue. Negative for fever, chills and appetite change.  HENT: Positive for congestion, postnasal drip, rhinorrhea, sinus pressure and sore throat. Negative for ear discharge, ear pain and mouth sores.   Eyes: Negative for visual disturbance.  Respiratory: Positive for cough and chest tightness. Negative for shortness of breath, wheezing and stridor.   Cardiovascular: Negative for chest pain, palpitations and leg swelling.  Gastrointestinal: Positive for vomiting (x1). Negative for nausea, abdominal pain and diarrhea.  Genitourinary: Negative for dysuria, urgency, frequency and hematuria.  Musculoskeletal: Negative for myalgias, back pain, arthralgias and neck stiffness.  Skin: Negative for rash.  Neurological: Positive for headaches. Negative for syncope, light-headedness and numbness.  Hematological: Negative for adenopathy.  Psychiatric/Behavioral: The patient is not nervous/anxious.   All other systems reviewed and are negative.     Allergies  Penicillins and Naproxen  Home Medications  Prior to Admission medications   Medication Sig Start Date End Date Taking? Authorizing Provider  amLODipine (NORVASC) 5 MG tablet Take 1 tablet (5 mg total) by mouth daily. For high blood pressure 06/29/14  Yes Encarnacion Slates, NP  benztropine (COGENTIN) 0.5 MG tablet Take 1  tablet (0.5 mg total) by mouth 2 (two) times daily in the am and at bedtime.. 07/20/14  Yes Elmarie Shiley, NP  cholecalciferol (VITAMIN D) 1000 UNITS tablet Take 1 tablet (1,000 Units total) by mouth every morning. For bone health 06/29/14  Yes Encarnacion Slates, NP  Darunavir Ethanolate (PREZISTA) 800 MG tablet Take 1 tablet (800 mg total) by mouth daily. For HIV infection 06/29/14  Yes Encarnacion Slates, NP  emtricitabine-tenofovir (TRUVADA) 200-300 MG per tablet Take 1 tablet by mouth daily. For HIV infection 06/29/14  Yes Encarnacion Slates, NP  gabapentin (NEURONTIN) 100 MG capsule Take 1 capsule (100 mg total) by mouth 3 (three) times daily. For agitation/pain 06/29/14  Yes Encarnacion Slates, NP  haloperidol (HALDOL) 5 MG tablet Take 1 tablet (5 mg total) by mouth 2 (two) times daily in the am and at bedtime.. 07/20/14  Yes Elmarie Shiley, NP  hydrochlorothiazide (HYDRODIURIL) 25 MG tablet Take 1 tablet (25 mg total) by mouth daily. For high blood pressure 06/29/14  Yes Encarnacion Slates, NP  lamoTRIgine (LAMICTAL) 25 MG tablet Take 1 tablet (25 mg total) by mouth daily. 07/20/14  Yes Elmarie Shiley, NP  levETIRAcetam (KEPPRA) 500 MG tablet Take 1 tablet (500 mg total) by mouth 2 (two) times daily. For seizure activities 06/29/14  Yes Encarnacion Slates, NP  levothyroxine (SYNTHROID, LEVOTHROID) 25 MCG tablet Take 1 tablet (25 mcg total) by mouth daily before breakfast. (Do not take wit other medications): For low thyroid function 06/29/14  Yes Encarnacion Slates, NP  lidocaine (LIDODERM) 5 % Place 1 patch onto the skin as needed. Remove & Discard patch within 12 hours or as directed by MD 07/20/14  Yes Elmarie Shiley, NP  lisinopril (PRINIVIL,ZESTRIL) 10 MG tablet Take 1 tablet (10 mg total) by mouth daily. For high blood pressure 06/29/14  Yes Encarnacion Slates, NP  Multiple Vitamin (MULTIVITAMIN WITH MINERALS) TABS tablet Take 1 tablet by mouth daily. 07/20/14  Yes Elmarie Shiley, NP  omega-3 acid ethyl esters (LOVAZA) 1 G capsule Take 2 capsules (2 g  total) by mouth 2 (two) times daily. For high cholesterol/fats 06/29/14  Yes Encarnacion Slates, NP  pantoprazole (PROTONIX) 20 MG tablet Take 1 tablet (20 mg total) by mouth daily. 07/20/14  Yes Elmarie Shiley, NP  ritonavir (NORVIR) 100 MG TABS tablet Take 1 tablet (100 mg total) by mouth daily. For HIV infection 06/29/14  Yes Encarnacion Slates, NP  traZODone (DESYREL) 100 MG tablet Take 1 tablet (100 mg total) by mouth at bedtime. 07/20/14  Yes Elmarie Shiley, NP  acetaminophen (TYLENOL) 500 MG tablet Take 1 tablet (500 mg total) by mouth every 6 (six) hours as needed. 07/23/14   Klaira Pesci, PA-C  benzonatate (TESSALON) 100 MG capsule Take 1 capsule (100 mg total) by mouth every 8 (eight) hours. 07/23/14   Audine Mangione, PA-C   BP 122/81 mmHg  Pulse 63  Temp(Src) 98.1 F (36.7 C) (Oral)  Resp 18  SpO2 100% Physical Exam  Constitutional: She is oriented to person, place, and time. She appears well-developed and well-nourished. No distress.  Awake, alert, nontoxic appearance  HENT:  Head: Normocephalic and atraumatic.  Right Ear: Tympanic membrane,  external ear and ear canal normal.  Left Ear: Tympanic membrane, external ear and ear canal normal.  Nose: Mucosal edema and rhinorrhea present. No epistaxis. Right sinus exhibits no maxillary sinus tenderness and no frontal sinus tenderness. Left sinus exhibits no maxillary sinus tenderness and no frontal sinus tenderness.  Mouth/Throat: Uvula is midline, oropharynx is clear and moist and mucous membranes are normal. Mucous membranes are not pale and not cyanotic. No oropharyngeal exudate, posterior oropharyngeal edema, posterior oropharyngeal erythema or tonsillar abscesses.  Eyes: Conjunctivae are normal. Pupils are equal, round, and reactive to light. No scleral icterus.  Neck: Normal range of motion and full passive range of motion without pain. Neck supple.  Cardiovascular: Normal rate, regular rhythm, normal heart sounds and intact distal pulses.    No murmur heard. Pulmonary/Chest: Effort normal and breath sounds normal. No stridor. No respiratory distress. She has no wheezes.  Clear and equal breath sounds without focal wheezes, rhonchi, rales  Abdominal: Soft. Bowel sounds are normal. She exhibits no mass. There is no tenderness. There is no rebound and no guarding.  Musculoskeletal: Normal range of motion. She exhibits no edema.  Lymphadenopathy:    She has no cervical adenopathy.  Neurological: She is alert and oriented to person, place, and time.  Speech is clear and goal oriented Moves extremities without ataxia  Skin: Skin is warm and dry. No rash noted. She is not diaphoretic.  Psychiatric: She has a normal mood and affect.  Nursing note and vitals reviewed.   ED Course  Procedures (including critical care time) Labs Review Labs Reviewed - No data to display  Imaging Review Dg Chest 2 View (if Patient Has Fever And/or Copd)  07/23/2014   CLINICAL DATA:  Weakness and cough. Shortness of breath and chest pain.  EXAM: CHEST  2 VIEW  COMPARISON:  None.  FINDINGS: Heart size is normal. The lungs are clear. The visualized soft tissues and bony thorax are clear. Surgical clips are noted at the gallbladder fossa.  IMPRESSION: No active cardiopulmonary disease.   Electronically Signed   By: Lawrence Santiago M.D.   On: 07/23/2014 14:01     EKG Interpretation None      MDM   Final diagnoses:  Viral URI with cough  Nasal congestion   Cheryl Thompson presents with URI symptoms for several hours.  Physical exam with same.  Pt CXR negative for acute infiltrate. Patients symptoms are consistent with URI, likely viral etiology. Discussed that antibiotics are not indicated for viral infections. Pt will be discharged with symptomatic treatment.  Verbalizes understanding and is agreeable with plan. Pt is hemodynamically stable & in NAD prior to dc.  I have personally reviewed patient's vitals, nursing note and any pertinent labs or  imaging.  I performed an undressed physical exam.    It has been determined that no acute conditions requiring further emergency intervention are present at this time. The patient/guardian have been advised of the diagnosis and plan. I reviewed all labs and imaging including any potential incidental findings. We have discussed signs and symptoms that warrant return to the ED and they are listed in the discharge instructions.    Vital signs are stable at discharge.   BP 122/81 mmHg  Pulse 63  Temp(Src) 98.1 F (36.7 C) (Oral)  Resp 18  SpO2 100%       Abigail Butts, PA-C 07/24/14 0149  Nat Christen, MD 07/24/14 [phone]

## 2014-07-23 NOTE — Discharge Instructions (Signed)
1. Medications: OTC mucinex, tessalon, albuterol, usual home medications 2. Treatment: rest, drink plenty of fluids, take tylenol for fever control 3. Follow Up: Please followup with your primary doctor in 3 days for discussion of your diagnoses and further evaluation after today's visit; if you do not have a primary care doctor use the resource guide provided to find one; Return to the ER for high fevers, difficulty breathing or other concerning symptoms   Upper Respiratory Infection, Adult An upper respiratory infection (URI) is also sometimes known as the common cold. The upper respiratory tract includes the nose, sinuses, throat, trachea, and bronchi. Bronchi are the airways leading to the lungs. Most people improve within 1 week, but symptoms can last up to 2 weeks. A residual cough may last even longer.  CAUSES Many different viruses can infect the tissues lining the upper respiratory tract. The tissues become irritated and inflamed and often become very moist. Mucus production is also common. A cold is contagious. You can easily spread the virus to others by oral contact. This includes kissing, sharing a glass, coughing, or sneezing. Touching your mouth or nose and then touching a surface, which is then touched by another person, can also spread the virus. SYMPTOMS  Symptoms typically develop 1 to 3 days after you come in contact with a cold virus. Symptoms vary from person to person. They may include:  Runny nose.  Sneezing.  Nasal congestion.  Sinus irritation.  Sore throat.  Loss of voice (laryngitis).  Cough.  Fatigue.  Muscle aches.  Loss of appetite.  Headache.  Low-grade fever. DIAGNOSIS  You might diagnose your own cold based on familiar symptoms, since most people get a cold 2 to 3 times a year. Your caregiver can confirm this based on your exam. Most importantly, your caregiver can check that your symptoms are not due to another disease such as strep throat,  sinusitis, pneumonia, asthma, or epiglottitis. Blood tests, throat tests, and X-rays are not necessary to diagnose a common cold, but they may sometimes be helpful in excluding other more serious diseases. Your caregiver will decide if any further tests are required. RISKS AND COMPLICATIONS  You may be at risk for a more severe case of the common cold if you smoke cigarettes, have chronic heart disease (such as heart failure) or lung disease (such as asthma), or if you have a weakened immune system. The very young and very old are also at risk for more serious infections. Bacterial sinusitis, middle ear infections, and bacterial pneumonia can complicate the common cold. The common cold can worsen asthma and chronic obstructive pulmonary disease (COPD). Sometimes, these complications can require emergency medical care and may be life-threatening. PREVENTION  The best way to protect against getting a cold is to practice good hygiene. Avoid oral or hand contact with people with cold symptoms. Wash your hands often if contact occurs. There is no clear evidence that vitamin C, vitamin E, echinacea, or exercise reduces the chance of developing a cold. However, it is always recommended to get plenty of rest and practice good nutrition. TREATMENT  Treatment is directed at relieving symptoms. There is no cure. Antibiotics are not effective, because the infection is caused by a virus, not by bacteria. Treatment may include:  Increased fluid intake. Sports drinks offer valuable electrolytes, sugars, and fluids.  Breathing heated mist or steam (vaporizer or shower).  Eating chicken soup or other clear broths, and maintaining good nutrition.  Getting plenty of rest.  Using gargles or  lozenges for comfort.  Controlling fevers with ibuprofen or acetaminophen as directed by your caregiver.  Increasing usage of your inhaler if you have asthma. Zinc gel and zinc lozenges, taken in the first 24 hours of the common  cold, can shorten the duration and lessen the severity of symptoms. Pain medicines may help with fever, muscle aches, and throat pain. A variety of non-prescription medicines are available to treat congestion and runny nose. Your caregiver can make recommendations and may suggest nasal or lung inhalers for other symptoms.  HOME CARE INSTRUCTIONS   Only take over-the-counter or prescription medicines for pain, discomfort, or fever as directed by your caregiver.  Use a warm mist humidifier or inhale steam from a shower to increase air moisture. This may keep secretions moist and make it easier to breathe.  Drink enough water and fluids to keep your urine clear or pale yellow.  Rest as needed.  Return to work when your temperature has returned to normal or as your caregiver advises. You may need to stay home longer to avoid infecting others. You can also use a face mask and careful hand washing to prevent spread of the virus. SEEK MEDICAL CARE IF:   After the first few days, you feel you are getting worse rather than better.  You need your caregiver's advice about medicines to control symptoms.  You develop chills, worsening shortness of breath, or brown or red sputum. These may be signs of pneumonia.  You develop yellow or brown nasal discharge or pain in the face, especially when you bend forward. These may be signs of sinusitis.  You develop a fever, swollen neck glands, pain with swallowing, or white areas in the back of your throat. These may be signs of strep throat. SEEK IMMEDIATE MEDICAL CARE IF:   You have a fever.  You develop severe or persistent headache, ear pain, sinus pain, or chest pain.  You develop wheezing, a prolonged cough, cough up blood, or have a change in your usual mucus (if you have chronic lung disease).  You develop sore muscles or a stiff neck. Document Released: 12/26/2000 Document Revised: 09/24/2011 Document Reviewed: 10/07/2013 [facility] Patient  Information 2015 [place], [place]. This information is not intended to replace advice given to you by your health care provider. Make sure you discuss any questions you have with your health care provider.

## 2014-07-23 NOTE — ED Notes (Signed)
Per EMS, pt from home. Pt c/o body aches, cough, congestion since 8 am this morning.  Unknown for fever at home.  Vomited x 1 last night.  No vomiting today. Vitals: 128/81, hr 84, 96% ra, resp 20.

## 2014-07-23 NOTE — ED Notes (Signed)
Pt complains of productive cough with yellow mucus, sore throat, nasal congestion and body aches. Hx of HIV.

## 2014-07-23 NOTE — ED Notes (Signed)
Respiratory at bedside.

## 2014-07-23 NOTE — Progress Notes (Signed)
MDI with spacer given to pt. Pt knows and understands how to use.

## 2014-08-09 ENCOUNTER — Ambulatory Visit: Payer: Self-pay | Admitting: Internal Medicine

## 2014-08-24 ENCOUNTER — Other Ambulatory Visit (INDEPENDENT_AMBULATORY_CARE_PROVIDER_SITE_OTHER): Payer: Federal, State, Local not specified - Other

## 2014-08-24 DIAGNOSIS — R7989 Other specified abnormal findings of blood chemistry: Secondary | ICD-10-CM

## 2014-08-24 DIAGNOSIS — E039 Hypothyroidism, unspecified: Secondary | ICD-10-CM

## 2014-08-25 ENCOUNTER — Other Ambulatory Visit (INDEPENDENT_AMBULATORY_CARE_PROVIDER_SITE_OTHER): Payer: Self-pay

## 2014-08-25 DIAGNOSIS — B20 Human immunodeficiency virus [HIV] disease: Secondary | ICD-10-CM

## 2014-08-25 LAB — CBC WITH DIFFERENTIAL/PLATELET
BASOS ABS: 0 10*3/uL (ref 0.0–0.1)
BASOS PCT: 0 % (ref 0–1)
Eosinophils Absolute: 0.1 10*3/uL (ref 0.0–0.7)
Eosinophils Relative: 2 % (ref 0–5)
HCT: 34.5 % — ABNORMAL LOW (ref 36.0–46.0)
Hemoglobin: 11.4 g/dL — ABNORMAL LOW (ref 12.0–15.0)
Lymphocytes Relative: 31 % (ref 12–46)
Lymphs Abs: 1.9 10*3/uL (ref 0.7–4.0)
MCH: 32 pg (ref 26.0–34.0)
MCHC: 33 g/dL (ref 30.0–36.0)
MCV: 96.9 fL (ref 78.0–100.0)
MPV: 12.6 fL — ABNORMAL HIGH (ref 8.6–12.4)
Monocytes Absolute: 0.6 10*3/uL (ref 0.1–1.0)
Monocytes Relative: 9 % (ref 3–12)
NEUTROS ABS: 3.6 10*3/uL (ref 1.7–7.7)
Neutrophils Relative %: 58 % (ref 43–77)
PLATELETS: 125 10*3/uL — AB (ref 150–400)
RBC: 3.56 MIL/uL — ABNORMAL LOW (ref 3.87–5.11)
RDW: 13.9 % (ref 11.5–15.5)
WBC: 6.2 10*3/uL (ref 4.0–10.5)

## 2014-08-25 LAB — TSH: TSH: 6.42 u[IU]/mL — ABNORMAL HIGH (ref 0.350–4.500)

## 2014-08-25 LAB — COMPLETE METABOLIC PANEL WITH GFR
ALK PHOS: 64 U/L (ref 39–117)
ALT: 41 U/L — ABNORMAL HIGH (ref 0–35)
AST: 62 U/L — ABNORMAL HIGH (ref 0–37)
Albumin: 3.7 g/dL (ref 3.5–5.2)
BILIRUBIN TOTAL: 0.5 mg/dL (ref 0.2–1.2)
BUN: 10 mg/dL (ref 6–23)
CO2: 28 meq/L (ref 19–32)
CREATININE: 1.16 mg/dL — AB (ref 0.50–1.10)
Calcium: 8.9 mg/dL (ref 8.4–10.5)
Chloride: 106 mEq/L (ref 96–112)
GFR, EST AFRICAN AMERICAN: 64 mL/min
GFR, EST NON AFRICAN AMERICAN: 55 mL/min — AB
GLUCOSE: 83 mg/dL (ref 70–99)
Potassium: 4 mEq/L (ref 3.5–5.3)
SODIUM: 140 meq/L (ref 135–145)
Total Protein: 7.1 g/dL (ref 6.0–8.3)

## 2014-08-26 LAB — T-HELPER CELLS (CD4) COUNT (NOT AT ARMC)
CD4 T CELL HELPER: 36 % (ref 33–55)
CD4 T Cell Abs: 720 /uL (ref 400–2700)

## 2014-08-26 LAB — HIV-1 RNA QUANT-NO REFLEX-BLD
HIV 1 RNA Quant: <20 copies/mL (ref <20)
HIV-1 RNA Quant, Log: <1.3 {Log} (ref <1.30)

## 2014-09-09 ENCOUNTER — Ambulatory Visit: Payer: Self-pay | Admitting: Internal Medicine

## 2014-09-16 ENCOUNTER — Ambulatory Visit: Payer: Self-pay | Admitting: Internal Medicine

## 2014-10-31 ENCOUNTER — Inpatient Hospital Stay (HOSPITAL_COMMUNITY)
Admission: AD | Admit: 2014-10-31 | Discharge: 2014-11-05 | DRG: 885 | Disposition: A | Discharge status: Rehabilitation Facility | Payer: Federal, State, Local not specified - Other | Source: Intra-hospital | Attending: Psychiatry | Admitting: Psychiatry

## 2014-10-31 ENCOUNTER — Encounter (HOSPITAL_COMMUNITY): Payer: Self-pay | Admitting: Emergency Medicine

## 2014-10-31 ENCOUNTER — Emergency Department (HOSPITAL_COMMUNITY)
Admission: EM | Admit: 2014-10-31 | Discharge: 2014-10-31 | Disposition: A | Discharge status: Other Acute Inpatient Hospital | Payer: Self-pay | Attending: Emergency Medicine | Admitting: Emergency Medicine

## 2014-10-31 DIAGNOSIS — Z720 Tobacco use: Secondary | ICD-10-CM | POA: Insufficient documentation

## 2014-10-31 DIAGNOSIS — T434X2A Poisoning by butyrophenone and thiothixene neuroleptics, intentional self-harm, initial encounter: Secondary | ICD-10-CM | POA: Insufficient documentation

## 2014-10-31 DIAGNOSIS — Y9289 Other specified places as the place of occurrence of the external cause: Secondary | ICD-10-CM | POA: Insufficient documentation

## 2014-10-31 DIAGNOSIS — F1410 Cocaine abuse, uncomplicated: Secondary | ICD-10-CM | POA: Diagnosis present

## 2014-10-31 DIAGNOSIS — Y998 Other external cause status: Secondary | ICD-10-CM | POA: Insufficient documentation

## 2014-10-31 DIAGNOSIS — Z90710 Acquired absence of both cervix and uterus: Secondary | ICD-10-CM

## 2014-10-31 DIAGNOSIS — Z833 Family history of diabetes mellitus: Secondary | ICD-10-CM

## 2014-10-31 DIAGNOSIS — Z8719 Personal history of other diseases of the digestive system: Secondary | ICD-10-CM | POA: Insufficient documentation

## 2014-10-31 DIAGNOSIS — F1020 Alcohol dependence, uncomplicated: Secondary | ICD-10-CM | POA: Diagnosis present

## 2014-10-31 DIAGNOSIS — K219 Gastro-esophageal reflux disease without esophagitis: Secondary | ICD-10-CM | POA: Diagnosis present

## 2014-10-31 DIAGNOSIS — F17210 Nicotine dependence, cigarettes, uncomplicated: Secondary | ICD-10-CM | POA: Diagnosis present

## 2014-10-31 DIAGNOSIS — Z823 Family history of stroke: Secondary | ICD-10-CM

## 2014-10-31 DIAGNOSIS — B20 Human immunodeficiency virus [HIV] disease: Secondary | ICD-10-CM | POA: Diagnosis present

## 2014-10-31 DIAGNOSIS — G4700 Insomnia, unspecified: Secondary | ICD-10-CM | POA: Diagnosis present

## 2014-10-31 DIAGNOSIS — IMO0001 Reserved for inherently not codable concepts without codable children: Secondary | ICD-10-CM

## 2014-10-31 DIAGNOSIS — F1210 Cannabis abuse, uncomplicated: Secondary | ICD-10-CM | POA: Insufficient documentation

## 2014-10-31 DIAGNOSIS — Z79899 Other long term (current) drug therapy: Secondary | ICD-10-CM | POA: Insufficient documentation

## 2014-10-31 DIAGNOSIS — F419 Anxiety disorder, unspecified: Secondary | ICD-10-CM | POA: Diagnosis present

## 2014-10-31 DIAGNOSIS — E039 Hypothyroidism, unspecified: Secondary | ICD-10-CM | POA: Diagnosis present

## 2014-10-31 DIAGNOSIS — F332 Major depressive disorder, recurrent severe without psychotic features: Principal | ICD-10-CM | POA: Diagnosis present

## 2014-10-31 DIAGNOSIS — Z8249 Family history of ischemic heart disease and other diseases of the circulatory system: Secondary | ICD-10-CM | POA: Diagnosis not present

## 2014-10-31 DIAGNOSIS — T428X2A Poisoning by antiparkinsonism drugs and other central muscle-tone depressants, intentional self-harm, initial encounter: Secondary | ICD-10-CM | POA: Insufficient documentation

## 2014-10-31 DIAGNOSIS — I10 Essential (primary) hypertension: Secondary | ICD-10-CM | POA: Diagnosis present

## 2014-10-31 DIAGNOSIS — F10230 Alcohol dependence with withdrawal, uncomplicated: Secondary | ICD-10-CM | POA: Insufficient documentation

## 2014-10-31 DIAGNOSIS — R45851 Suicidal ideations: Secondary | ICD-10-CM | POA: Diagnosis not present

## 2014-10-31 DIAGNOSIS — Y9389 Activity, other specified: Secondary | ICD-10-CM | POA: Insufficient documentation

## 2014-10-31 DIAGNOSIS — F329 Major depressive disorder, single episode, unspecified: Secondary | ICD-10-CM | POA: Insufficient documentation

## 2014-10-31 DIAGNOSIS — Y901 Blood alcohol level of 20-39 mg/100 ml: Secondary | ICD-10-CM | POA: Diagnosis present

## 2014-10-31 DIAGNOSIS — E079 Disorder of thyroid, unspecified: Secondary | ICD-10-CM | POA: Insufficient documentation

## 2014-10-31 DIAGNOSIS — T43212A Poisoning by selective serotonin and norepinephrine reuptake inhibitors, intentional self-harm, initial encounter: Secondary | ICD-10-CM | POA: Insufficient documentation

## 2014-10-31 DIAGNOSIS — B1920 Unspecified viral hepatitis C without hepatic coma: Secondary | ICD-10-CM | POA: Diagnosis present

## 2014-10-31 DIAGNOSIS — G40909 Epilepsy, unspecified, not intractable, without status epilepticus: Secondary | ICD-10-CM | POA: Insufficient documentation

## 2014-10-31 DIAGNOSIS — Z21 Asymptomatic human immunodeficiency virus [HIV] infection status: Secondary | ICD-10-CM | POA: Insufficient documentation

## 2014-10-31 DIAGNOSIS — Z880 Allergy status to penicillin: Secondary | ICD-10-CM | POA: Insufficient documentation

## 2014-10-31 LAB — COMPREHENSIVE METABOLIC PANEL
ALK PHOS: 76 U/L (ref 39–117)
ALT: 94 U/L — ABNORMAL HIGH (ref 0–35)
ANION GAP: 10 (ref 5–15)
AST: 131 U/L — ABNORMAL HIGH (ref 0–37)
Albumin: 3.5 g/dL (ref 3.5–5.2)
BUN: 19 mg/dL (ref 6–23)
CO2: 24 mmol/L (ref 19–32)
Calcium: 8.5 mg/dL (ref 8.4–10.5)
Chloride: 99 mmol/L (ref 96–112)
Creatinine, Ser: 1.04 mg/dL (ref 0.50–1.10)
GFR calc Af Amer: 72 mL/min — ABNORMAL LOW (ref >90)
GFR calc non Af Amer: 62 mL/min — ABNORMAL LOW (ref >90)
GLUCOSE: 79 mg/dL (ref 70–99)
Potassium: 4.2 mmol/L (ref 3.5–5.1)
Sodium: 133 mmol/L — ABNORMAL LOW (ref 135–145)
TOTAL PROTEIN: 7.8 g/dL (ref 6.0–8.3)
Total Bilirubin: 0.4 mg/dL (ref 0.3–1.2)

## 2014-10-31 LAB — CBC
HCT: 34.8 % — ABNORMAL LOW (ref 36.0–46.0)
HEMOGLOBIN: 11.8 g/dL — AB (ref 12.0–15.0)
MCH: 32.8 pg (ref 26.0–34.0)
MCHC: 33.9 g/dL (ref 30.0–36.0)
MCV: 96.7 fL (ref 78.0–100.0)
Platelets: 156 10*3/uL (ref 150–400)
RBC: 3.6 MIL/uL — ABNORMAL LOW (ref 3.87–5.11)
RDW: 13.4 % (ref 11.5–15.5)
WBC: 6.9 10*3/uL (ref 4.0–10.5)

## 2014-10-31 LAB — RAPID URINE DRUG SCREEN, HOSP PERFORMED
AMPHETAMINES: NOT DETECTED
Barbiturates: NOT DETECTED
Benzodiazepines: NOT DETECTED
COCAINE: POSITIVE — AB
Opiates: NOT DETECTED
Tetrahydrocannabinol: POSITIVE — AB

## 2014-10-31 LAB — ETHANOL: Alcohol, Ethyl (B): 24 mg/dL — ABNORMAL HIGH (ref 0–9)

## 2014-10-31 LAB — SALICYLATE LEVEL: Salicylate Lvl: <4 mg/dL (ref 2.8–20.0)

## 2014-10-31 LAB — ACETAMINOPHEN LEVEL

## 2014-10-31 MED ORDER — LAMOTRIGINE 25 MG PO TABS
25.0000 mg | ORAL_TABLET | Freq: Every day | ORAL | Status: DC
  Administered 2014-10-31: 25 mg via ORAL
  Filled 2014-10-31: qty 1

## 2014-10-31 MED ORDER — LEVOTHYROXINE SODIUM 25 MCG PO TABS
25.0000 ug | ORAL_TABLET | Freq: Every day | ORAL | Status: DC
  Filled 2014-10-31: qty 1

## 2014-10-31 MED ORDER — NICOTINE 21 MG/24HR TD PT24: 21.0000 mg | MEDICATED_PATCH | Freq: Every day | TRANSDERMAL | Status: DC

## 2014-10-31 MED ORDER — HALOPERIDOL 5 MG PO TABS
5.0000 mg | ORAL_TABLET | ORAL | Status: DC
  Filled 2014-10-31: qty 1

## 2014-10-31 MED ORDER — OMEGA-3-ACID ETHYL ESTERS 1 G PO CAPS
2.0000 g | ORAL_CAPSULE | Freq: Two times a day (BID) | ORAL | Status: DC
  Filled 2014-10-31: qty 2

## 2014-10-31 MED ORDER — AMLODIPINE BESYLATE 5 MG PO TABS: 5.0000 mg | ORAL_TABLET | Freq: Every day | ORAL | Status: DC

## 2014-10-31 MED ORDER — RITONAVIR 100 MG PO TABS
100.0000 mg | ORAL_TABLET | Freq: Every day | ORAL | Status: DC
  Filled 2014-10-31 (×2): qty 1

## 2014-10-31 MED ORDER — BENZTROPINE MESYLATE 1 MG PO TABS
0.5000 mg | ORAL_TABLET | ORAL | Status: DC
  Filled 2014-10-31: qty 1

## 2014-10-31 MED ORDER — ACETAMINOPHEN 325 MG PO TABS: 650.0000 mg | ORAL_TABLET | ORAL | Status: DC | PRN

## 2014-10-31 MED ORDER — DARUNAVIR ETHANOLATE 800 MG PO TABS
800.0000 mg | ORAL_TABLET | Freq: Every day | ORAL | Status: DC
  Administered 2014-10-31: 800 mg via ORAL
  Filled 2014-10-31 (×2): qty 1

## 2014-10-31 MED ORDER — ONDANSETRON HCL 4 MG PO TABS: 4.0000 mg | ORAL_TABLET | Freq: Three times a day (TID) | ORAL | Status: DC | PRN

## 2014-10-31 MED ORDER — EMTRICITABINE-TENOFOVIR DF 200-300 MG PO TABS
1.0000 | ORAL_TABLET | Freq: Every day | ORAL | Status: DC
  Administered 2014-10-31: 1 via ORAL
  Filled 2014-10-31: qty 1

## 2014-10-31 MED ORDER — ZOLPIDEM TARTRATE 5 MG PO TABS: 5.0000 mg | ORAL_TABLET | Freq: Every evening | ORAL | Status: DC | PRN

## 2014-10-31 MED ORDER — LISINOPRIL 10 MG PO TABS: 10.0000 mg | ORAL_TABLET | Freq: Every day | ORAL | Status: DC

## 2014-10-31 MED ORDER — GABAPENTIN 100 MG PO CAPS
100.0000 mg | ORAL_CAPSULE | Freq: Three times a day (TID) | ORAL | Status: DC
  Administered 2014-10-31: 100 mg via ORAL
  Filled 2014-10-31 (×2): qty 1

## 2014-10-31 MED ORDER — TRAZODONE HCL 100 MG PO TABS: 100.0000 mg | ORAL_TABLET | Freq: Every day | ORAL | Status: DC

## 2014-10-31 MED ORDER — HYDROCHLOROTHIAZIDE 25 MG PO TABS: 25.0000 mg | ORAL_TABLET | Freq: Every day | ORAL | Status: DC

## 2014-10-31 NOTE — ED Notes (Signed)
Bed: BOF75 Expected date:  Expected time:  Means of arrival:  Comments: Hold for room 14

## 2014-10-31 NOTE — ED Notes (Signed)
Patient is resting comfortably. 

## 2014-10-31 NOTE — ED Provider Notes (Signed)
CSN: [id number]     Arrival date & time 10/31/14  1015 History   First MD Initiated Contact with Patient 10/31/14 1022     Chief Complaint  Patient presents with  . Drug Overdose     HPI Patient admits to intentional ingestion today and attempt to kill himself.  Patient reports that she took 4 trazodone, 6 Haldol, 5 Cogentin.  She also has a history of cocaine and marijuana use as well as alcohol abuse.  She does admit to drinking alcohol.  She states she took these early this morning at approximately 4-5 AM in an attempt to kill herself. Reports she is depressed   Past Medical History  Diagnosis Date  . HIV (human immunodeficiency virus infection)   . Hypertension   . Depression   . Seizures   . Acute alcoholic hepatitis   . Hepatitis C   . Diverticulitis y-1  . Diverticulosis y-1  . Thyroid disease    Past Surgical History  Procedure Laterality Date  . Total abdominal hysterectomy w/ bilateral salpingoophorectomy  2006  . Right knee surgery      around 49 years old, for ? chronic dislocation  . Abdominal hysterectomy     Family History  Problem Relation Age of Onset  . Drug abuse Mother   . Diabetes Sister   . Diabetes Maternal Aunt   . Hypertension Maternal Aunt   . Hyperlipidemia Maternal Aunt   . Stroke Maternal Grandmother   . Hypertension Maternal Grandmother   . Diabetes Maternal Grandmother   . Stroke Maternal Grandfather   . Alcohol abuse Maternal Grandfather    History  Substance Use Topics  . Smoking status: Current Every Day Smoker -- 0.10 packs/day    Types: Cigarettes  . Smokeless tobacco: Never Used  . Alcohol Use: 0.0 oz/week     Comment: pt reports she has been drinking "everything" since 06/16/14.  Over 9 drinks a day   OB History    No data available     Review of Systems  All other systems reviewed and are negative.     Allergies  Penicillins and Naproxen  Home Medications   Prior to Admission medications   Medication Sig Start  Date End Date Taking? Authorizing Provider  benztropine (COGENTIN) 0.5 MG tablet Take 1 tablet (0.5 mg total) by mouth 2 (two) times daily in the am and at bedtime.. 07/20/14  Yes Niel Hummer, NP  cholecalciferol (VITAMIN D) 1000 UNITS tablet Take 1 tablet (1,000 Units total) by mouth every morning. For bone health 06/29/14  Yes Encarnacion Slates, NP  Darunavir Ethanolate (PREZISTA) 800 MG tablet Take 1 tablet (800 mg total) by mouth daily. For HIV infection 06/29/14  Yes Encarnacion Slates, NP  emtricitabine-tenofovir (TRUVADA) 200-300 MG per tablet Take 1 tablet by mouth daily. For HIV infection 06/29/14  Yes Encarnacion Slates, NP  gabapentin (NEURONTIN) 100 MG capsule Take 1 capsule (100 mg total) by mouth 3 (three) times daily. For agitation/pain 06/29/14  Yes Encarnacion Slates, NP  haloperidol (HALDOL) 5 MG tablet Take 1 tablet (5 mg total) by mouth 2 (two) times daily in the am and at bedtime.. 07/20/14  Yes Niel Hummer, NP  lamoTRIgine (LAMICTAL) 25 MG tablet Take 1 tablet (25 mg total) by mouth daily. 07/20/14  Yes Niel Hummer, NP  levothyroxine (SYNTHROID, LEVOTHROID) 25 MCG tablet Take 1 tablet (25 mcg total) by mouth daily before breakfast. (Do not take wit other medications): For low  thyroid function 06/29/14  Yes Encarnacion Slates, NP  Multiple Vitamin (MULTIVITAMIN WITH MINERALS) TABS tablet Take 1 tablet by mouth daily. 07/20/14  Yes Niel Hummer, NP  omega-3 acid ethyl esters (LOVAZA) 1 G capsule Take 2 capsules (2 g total) by mouth 2 (two) times daily. For high cholesterol/fats 06/29/14  Yes Encarnacion Slates, NP  pantoprazole (PROTONIX) 20 MG tablet Take 1 tablet (20 mg total) by mouth daily. 07/20/14  Yes Niel Hummer, NP  ritonavir (NORVIR) 100 MG TABS tablet Take 1 tablet (100 mg total) by mouth daily. For HIV infection 06/29/14  Yes Encarnacion Slates, NP  traZODone (DESYREL) 100 MG tablet Take 1 tablet (100 mg total) by mouth at bedtime. 07/20/14  Yes Niel Hummer, NP  acetaminophen (TYLENOL) 500 MG tablet Take  1 tablet (500 mg total) by mouth every 6 (six) hours as needed. Patient not taking: Reported on 10/31/2014 07/23/14   Jarrett Soho Muthersbaugh, PA-C  amLODipine (NORVASC) 5 MG tablet Take 1 tablet (5 mg total) by mouth daily. For high blood pressure Patient not taking: Reported on 10/31/2014 06/29/14   Encarnacion Slates, NP  benzonatate (TESSALON) 100 MG capsule Take 1 capsule (100 mg total) by mouth every 8 (eight) hours. Patient not taking: Reported on 10/31/2014 07/23/14   Jarrett Soho Muthersbaugh, PA-C  hydrochlorothiazide (HYDRODIURIL) 25 MG tablet Take 1 tablet (25 mg total) by mouth daily. For high blood pressure Patient not taking: Reported on 10/31/2014 06/29/14   Encarnacion Slates, NP  levETIRAcetam (KEPPRA) 500 MG tablet Take 1 tablet (500 mg total) by mouth 2 (two) times daily. For seizure activities Patient not taking: Reported on 10/31/2014 06/29/14   Encarnacion Slates, NP  lidocaine (LIDODERM) 5 % Place 1 patch onto the skin as needed. Remove & Discard patch within 12 hours or as directed by MD Patient not taking: Reported on 10/31/2014 07/20/14   Niel Hummer, NP  lisinopril (PRINIVIL,ZESTRIL) 10 MG tablet Take 1 tablet (10 mg total) by mouth daily. For high blood pressure Patient not taking: Reported on 10/31/2014 06/29/14   Encarnacion Slates, NP   BP 121/65 mmHg  Pulse 82  Temp(Src) 99 F (37.2 C) (Oral)  Resp 14  SpO2 97% Physical Exam  Constitutional: She is oriented to person, place, and time. She appears well-developed and well-nourished. No distress.  HENT:  Head: Normocephalic and atraumatic.  Eyes: EOM are normal.  Neck: Normal range of motion.  Cardiovascular: Normal rate, regular rhythm and normal heart sounds.   Pulmonary/Chest: Effort normal and breath sounds normal.  Abdominal: Soft. She exhibits no distension. There is no tenderness.  Musculoskeletal: Normal range of motion.  Neurological: She is alert and oriented to person, place, and time.  Skin: Skin is warm and dry.  Psychiatric: She  has a normal mood and affect. Judgment normal.  Nursing note and vitals reviewed.   ED Course  Procedures (including critical care time) Labs Review Labs Reviewed  CBC - Abnormal; Notable for the following:    RBC 3.60 (*)    Hemoglobin 11.8 (*)    HCT 34.8 (*)    All other components within normal limits  COMPREHENSIVE METABOLIC PANEL - Abnormal; Notable for the following:    Sodium 133 (*)    AST 131 (*)    ALT 94 (*)    GFR calc non Af Amer 62 (*)    GFR calc Af Amer 72 (*)    All other components within normal limits  ETHANOL - Abnormal; Notable for the following:    Alcohol, Ethyl (B) 24 (*)    All other components within normal limits  URINE RAPID DRUG SCREEN (HOSP PERFORMED) - Abnormal; Notable for the following:    Cocaine POSITIVE (*)    Tetrahydrocannabinol POSITIVE (*)    All other components within normal limits  ACETAMINOPHEN LEVEL - Abnormal; Notable for the following:    Acetaminophen (Tylenol), Serum <10.0 (*)    All other components within normal limits  SALICYLATE LEVEL  LAMOTRIGINE LEVEL    Imaging Review No results found.   EKG Interpretation   Date/Time:  Sunday October 31 2014 10:43:54 EDT Ventricular Rate:  79 PR Interval:  119 QRS Duration: 133 QT Interval:  426 QTC Calculation: 488 R Axis:   -57 Text Interpretation:  Sinus rhythm Borderline short PR interval RBBB and  LAFB Baseline wander in lead(s) V1 V2 V3 V4 V5 V6 No significant change  was found Confirmed by Shawne Eskelson  MD, Raven Harmes ([id number]) on 10/31/2014 10:48:14 AM      MDM   Final diagnoses:  None    Patient was observed in the emergency department.  She is overall well-appearing.  This a nontoxic ingestion.  Patient is medically clear at this time.  TTS to evaluate.    Jola Schmidt, MD 10/31/14 [phone]

## 2014-10-31 NOTE — ED Notes (Signed)
Pt has 2 bags of belongings in locker 26
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
## 2014-10-31 NOTE — ED Notes (Signed)
Per EMS-states patient said she took a " bunch of psyche drugs because she is depressed"-cant tell what she took and how much-took at 4 am-patient call EMS at 10 am-patient uses crack, THC and moonshine early this am-states she wants some help

## 2014-10-31 NOTE — ED Notes (Signed)
Pt unable to obtain UA at this time

## 2014-10-31 NOTE — ED Notes (Signed)
Bed: AL93 Expected date:  Expected time:  Means of arrival:  Comments: EMS-OD

## 2014-11-01 ENCOUNTER — Encounter (HOSPITAL_COMMUNITY): Payer: Self-pay | Admitting: *Deleted

## 2014-11-01 DIAGNOSIS — F332 Major depressive disorder, recurrent severe without psychotic features: Secondary | ICD-10-CM | POA: Diagnosis present

## 2014-11-01 DIAGNOSIS — R45851 Suicidal ideations: Secondary | ICD-10-CM

## 2014-11-01 DIAGNOSIS — F1020 Alcohol dependence, uncomplicated: Secondary | ICD-10-CM | POA: Diagnosis present

## 2014-11-01 MED ORDER — LOPERAMIDE HCL 2 MG PO CAPS: 2.0000 mg | ORAL_CAPSULE | ORAL | Status: AC | PRN

## 2014-11-01 MED ORDER — OMEGA-3-ACID ETHYL ESTERS 1 G PO CAPS
2.0000 g | ORAL_CAPSULE | Freq: Two times a day (BID) | ORAL | Status: DC
  Administered 2014-11-01 – 2014-11-05 (×9): 2 g via ORAL
  Filled 2014-11-01 (×12): qty 2

## 2014-11-01 MED ORDER — GABAPENTIN 100 MG PO CAPS
100.0000 mg | ORAL_CAPSULE | Freq: Three times a day (TID) | ORAL | Status: DC
  Administered 2014-11-01 – 2014-11-05 (×8): 100 mg via ORAL
  Filled 2014-11-01 (×17): qty 1

## 2014-11-01 MED ORDER — LEVOTHYROXINE SODIUM 25 MCG PO TABS: 25.0000 ug | ORAL_TABLET | Freq: Every day | ORAL | Status: DC

## 2014-11-01 MED ORDER — VITAMIN B-1 100 MG PO TABS
100.0000 mg | ORAL_TABLET | Freq: Every day | ORAL | Status: DC
  Administered 2014-11-02 – 2014-11-05 (×4): 100 mg via ORAL
  Filled 2014-11-01 (×5): qty 1

## 2014-11-01 MED ORDER — AMLODIPINE BESYLATE 5 MG PO TABS
5.0000 mg | ORAL_TABLET | Freq: Every day | ORAL | Status: DC
  Administered 2014-11-01 – 2014-11-05 (×3): 5 mg via ORAL
  Filled 2014-11-01 (×6): qty 1

## 2014-11-01 MED ORDER — MAGNESIUM HYDROXIDE 400 MG/5ML PO SUSP: 30.0000 mL | Freq: Every day | ORAL | Status: DC | PRN

## 2014-11-01 MED ORDER — ACETAMINOPHEN 325 MG PO TABS
650.0000 mg | ORAL_TABLET | Freq: Four times a day (QID) | ORAL | Status: DC | PRN
  Administered 2014-11-02 – 2014-11-03 (×2): 650 mg via ORAL
  Filled 2014-11-01 (×2): qty 2

## 2014-11-01 MED ORDER — LEVOTHYROXINE SODIUM 25 MCG PO TABS
25.0000 ug | ORAL_TABLET | Freq: Every day | ORAL | Status: DC
  Administered 2014-11-01 – 2014-11-05 (×5): 25 ug via ORAL
  Filled 2014-11-01 (×7): qty 1

## 2014-11-01 MED ORDER — LORAZEPAM 1 MG PO TABS: 1.0000 mg | ORAL_TABLET | Freq: Four times a day (QID) | ORAL | Status: AC | PRN

## 2014-11-01 MED ORDER — LORAZEPAM 1 MG PO TABS
1.0000 mg | ORAL_TABLET | Freq: Three times a day (TID) | ORAL | Status: AC
  Administered 2014-11-03 (×2): 1 mg via ORAL
  Filled 2014-11-01 (×2): qty 1

## 2014-11-01 MED ORDER — RITONAVIR 100 MG PO TABS
100.0000 mg | ORAL_TABLET | Freq: Every day | ORAL | Status: DC
  Administered 2014-11-01 – 2014-11-05 (×5): 100 mg via ORAL
  Filled 2014-11-01 (×6): qty 1

## 2014-11-01 MED ORDER — HYDROXYZINE HCL 25 MG PO TABS
25.0000 mg | ORAL_TABLET | ORAL | Status: DC | PRN
  Administered 2014-11-04 (×2): 25 mg via ORAL
  Filled 2014-11-01 (×2): qty 1

## 2014-11-01 MED ORDER — HALOPERIDOL 5 MG PO TABS
5.0000 mg | ORAL_TABLET | ORAL | Status: DC
  Administered 2014-11-01 – 2014-11-05 (×8): 5 mg via ORAL
  Filled 2014-11-01 (×11): qty 1

## 2014-11-01 MED ORDER — DARUNAVIR ETHANOLATE 800 MG PO TABS
800.0000 mg | ORAL_TABLET | Freq: Every day | ORAL | Status: DC
  Administered 2014-11-01 – 2014-11-05 (×5): 800 mg via ORAL
  Filled 2014-11-01 (×6): qty 1

## 2014-11-01 MED ORDER — ONDANSETRON 4 MG PO TBDP: 4.0000 mg | ORAL_TABLET | Freq: Four times a day (QID) | ORAL | Status: AC | PRN

## 2014-11-01 MED ORDER — LORAZEPAM 1 MG PO TABS
1.0000 mg | ORAL_TABLET | Freq: Two times a day (BID) | ORAL | Status: AC
  Administered 2014-11-03 – 2014-11-04 (×2): 1 mg via ORAL
  Filled 2014-11-01 (×2): qty 1

## 2014-11-01 MED ORDER — TRAZODONE HCL 100 MG PO TABS
100.0000 mg | ORAL_TABLET | Freq: Every day | ORAL | Status: DC
  Administered 2014-11-02 – 2014-11-04 (×2): 100 mg via ORAL
  Filled 2014-11-01 (×6): qty 1

## 2014-11-01 MED ORDER — ADULT MULTIVITAMIN W/MINERALS CH
1.0000 | ORAL_TABLET | Freq: Every day | ORAL | Status: DC
  Administered 2014-11-01 – 2014-11-05 (×5): 1 via ORAL
  Filled 2014-11-01 (×6): qty 1

## 2014-11-01 MED ORDER — HYDROCHLOROTHIAZIDE 25 MG PO TABS
25.0000 mg | ORAL_TABLET | Freq: Every day | ORAL | Status: DC
  Administered 2014-11-01 – 2014-11-05 (×3): 25 mg via ORAL
  Filled 2014-11-01 (×6): qty 1

## 2014-11-01 MED ORDER — THIAMINE HCL 100 MG/ML IJ SOLN
100.0000 mg | Freq: Once | INTRAMUSCULAR | Status: AC
  Administered 2014-11-01: 100 mg via INTRAMUSCULAR
  Filled 2014-11-01: qty 2

## 2014-11-01 MED ORDER — ENSURE ENLIVE PO LIQD
237.0000 mL | Freq: Three times a day (TID) | ORAL | Status: DC
  Administered 2014-11-02 – 2014-11-05 (×8): 237 mL via ORAL

## 2014-11-01 MED ORDER — LORAZEPAM 1 MG PO TABS
1.0000 mg | ORAL_TABLET | Freq: Every day | ORAL | Status: AC
  Administered 2014-11-05: 1 mg via ORAL
  Filled 2014-11-01: qty 1

## 2014-11-01 MED ORDER — ALUM & MAG HYDROXIDE-SIMETH 200-200-20 MG/5ML PO SUSP: 30.0000 mL | ORAL | Status: DC | PRN

## 2014-11-01 MED ORDER — LEVETIRACETAM 500 MG PO TABS
500.0000 mg | ORAL_TABLET | Freq: Two times a day (BID) | ORAL | Status: DC
  Administered 2014-11-01 – 2014-11-05 (×8): 500 mg via ORAL
  Filled 2014-11-01 (×10): qty 1

## 2014-11-01 MED ORDER — BENZTROPINE MESYLATE 0.5 MG PO TABS
0.5000 mg | ORAL_TABLET | ORAL | Status: DC
  Administered 2014-11-01 – 2014-11-05 (×9): 0.5 mg via ORAL
  Filled 2014-11-01 (×11): qty 1

## 2014-11-01 MED ORDER — LAMOTRIGINE 25 MG PO TABS
25.0000 mg | ORAL_TABLET | Freq: Every day | ORAL | Status: DC
  Administered 2014-11-01 – 2014-11-05 (×5): 25 mg via ORAL
  Filled 2014-11-01 (×6): qty 1

## 2014-11-01 MED ORDER — PANTOPRAZOLE SODIUM 20 MG PO TBEC
20.0000 mg | DELAYED_RELEASE_TABLET | Freq: Every day | ORAL | Status: DC
  Administered 2014-11-01 – 2014-11-05 (×5): 20 mg via ORAL
  Filled 2014-11-01 (×6): qty 1

## 2014-11-01 MED ORDER — LORAZEPAM 1 MG PO TABS
1.0000 mg | ORAL_TABLET | Freq: Four times a day (QID) | ORAL | Status: AC
  Administered 2014-11-01 – 2014-11-02 (×3): 1 mg via ORAL
  Filled 2014-11-01 (×4): qty 1

## 2014-11-01 MED ORDER — EMTRICITABINE-TENOFOVIR DF 200-300 MG PO TABS
1.0000 | ORAL_TABLET | Freq: Every day | ORAL | Status: DC
  Administered 2014-11-01 – 2014-11-05 (×5): 1 via ORAL
  Filled 2014-11-01 (×6): qty 1

## 2014-11-01 MED ORDER — LISINOPRIL 10 MG PO TABS
10.0000 mg | ORAL_TABLET | Freq: Every day | ORAL | Status: DC
  Administered 2014-11-01 – 2014-11-05 (×3): 10 mg via ORAL
  Filled 2014-11-01 (×2): qty 1
  Filled 2014-11-01: qty 2
  Filled 2014-11-01 (×2): qty 1
  Filled 2014-11-01 (×2): qty 2
  Filled 2014-11-01 (×2): qty 1

## 2014-11-01 NOTE — Tx Team (Signed)
Initial Interdisciplinary Treatment Plan   PATIENT STRESSORS: Financial difficulties Health problems Loss of mother in December Marital or family conflict Substance abuse   PATIENT STRENGTHS: Average or above average intelligence Communication skills General fund of knowledge Religious Affiliation Supportive family/friends   PROBLEM LIST: Problem List/Patient Goals Date to be addressed Date deferred Reason deferred Estimated date of resolution  "I just don't feel like living anymore"  Depression 10/31/1014           "I drink 2 fifths/day, $40 cocaine/day, and smoke about 10 blunts a day"  Substance abuse 10/31/2014           Suicide attempt, OD on prescribed meds. 10/31/2014                              DISCHARGE CRITERIA:  Ability to meet basic life and health needs Adequate post-discharge living arrangements Improved stabilization in mood, thinking, and/or behavior Motivation to continue treatment in a less acute level of care Safe-care adequate arrangements made Verbal commitment to aftercare and medication compliance Withdrawal symptoms are absent or subacute and managed without 24-hour nursing intervention  PRELIMINARY DISCHARGE PLAN: Attend aftercare/continuing care group Attend 12-step recovery group Outpatient therapy Placement in alternative living arrangements  PATIENT/FAMIILY INVOLVEMENT: This treatment plan has been presented to and reviewed with the patient, Cheryl Thompson, and/or family member, 2.  The patient and family have been given the opportunity to ask questions and make suggestions.  [name] 11/01/2014, 5:44 AM

## 2014-11-01 NOTE — Progress Notes (Signed)
Patient ID: Cheryl Thompson, female   DOB: February 16, 1966, 49 y.o.   MRN: [id number]  D: Pt. Reports SI this is constant and chronic, when asked if she had a plan patient reports, "nothing in the hospital." Patient is able to contract for safety. Patient reports that she sees, "bugs in my peripheral." Patient denies HI and auditory hallucinations. Patient reports sleep was good last night, appetyite is poor, energy level is low, and concentration is poor. Patient rates depression, hopelessness, and anxiety at 10/10 for the day. Patient reports pain in her knees that are chronic but patient reports Gabapentin can help. Patient reports not being able to stay out of bed today and is lethargic. Due to this, NP Nwoko has stopped Gabapentin until tomorrow. Patient was notified and she verbalized understanding. Support and encouragement provided to the patient to use safety when getting out of bed and walking. Scheduled medications administered to patient per physician's orders. Patient is receptive and cooperative with Probation officer but minimal and forwards little. Patient is mainly seen in her room and sleeping. Patient does try to attend some groups when able. Q15 minute checks are maintained for safety.
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
## 2014-11-01 NOTE — Progress Notes (Signed)
NUTRITION ASSESSMENT  Pt identified as at risk on the Malnutrition Screen Tool  INTERVENTION: 1. Educated patient on the importance of nutrition and encouraged intake of food and beverages. 2. Discussed weight goals. 3. Supplements: Ensure Enlive po TID, each supplement provides 350 kcal and 20 grams of protein  NUTRITION DIAGNOSIS: Increased nutrient needs related to ETOH abuse as evidenced by pt reports drinks 2 fifths of ETOH daily.   Goal: Pt to meet >/= 90% of their estimated nutrition needs.  Monitor:  PO intake  Assessment:  49 yo AAF who came from [facility] after taking an OD of her meds, states took 13-14 neurontins, 2-3 haldols, and 2-3 cogentin with alcohol, and her intention was to not wake up.States is homeless, lives with friends, goes from house to house. Has been dealing with deteriorating health, has HIV, hep C, has seizures, diarrhea 4-5 x/day, no appetite, poor sleep, depressed, and feels tired of trying to keep going.States drinks 2 fifths/day of whatever is available, vodka, gin,etc. And uses about $40 of cocaine/day, and smokes about 10 blunts/day, states has financial pfoblems.  Pt lying in room. Answered the door when RD visited pt's room but refused to any questions that were asked.   Pt is at nutritional risk given homelessness and Hx of HIV, ETOH and drug abuse. Suspect poor nutrition quality PTA. Pt would benefit from nutritional supplementation. RD to order.  Height: Ht Readings from Last 1 Encounters:  10/31/14 5\' 7"  (1.702 m)    Weight: Wt Readings from Last 1 Encounters:  10/31/14 151 lb (68.493 kg)    Weight Hx: Wt Readings from Last 10 Encounters:  10/31/14 151 lb (68.493 kg)  07/15/14 130 lb (58.968 kg)  07/13/14 130 lb (58.968 kg)  06/24/14 140 lb (63.504 kg)  05/23/14 154 lb (69.854 kg)  05/05/14 153 lb (69.4 kg)  04/05/14 157 lb (71.215 kg)  03/16/14 155 lb (70.308 kg)  12/10/13 161 lb (73.029 kg)  11/28/13 165 lb (74.844 kg)    BMI:   Body mass index is 23.64 kg/(m^2). Pt meets criteria for normal range based on current BMI.  Estimated Nutritional Needs: Kcal: 30-35 kcal/kg Protein: > 1.5 gram protein/kg Fluid: 1 ml/kcal  Diet Order: Diet regular Room service appropriate?: Yes; Fluid consistency:: Thin Pt is also offered choice of unit snacks mid-morning and mid-afternoon.  Pt is eating as desired.   Lab results and medications reviewed.   Clayton Bibles, MS, RD, LDN Pager: [phone] After Hours Pager: [phone]

## 2014-11-01 NOTE — Plan of Care (Signed)
Problem: Ineffective individual coping Goal: STG: Patient will remain free from self harm Outcome: Progressing Patient remains free from self harm as evidenced by Q15 minute safety checks, which are maintained.

## 2014-11-01 NOTE — Progress Notes (Signed)
Pt sedated scoring a (3) on the Riker sedation-agitation scale.  Pt arousable by verbal commands but drifts off again. Pt reports feeling "out of it".  Medications Held. Pt was hypotensive and encouraged to increase fluid intake.  A: Pt appears to be in no signs of distress at this time. Pt's respirations even and unlabored. Writer will continue to monitor pt and assess for alertness.  R: Pt remains safe at this time.    Pt awake during 0115 check. Pt more alert and was able to speak to writer without drifting off immediately. Pt was again encouraged to increase fluid intake. Pt was informed that her medications were previously held. Pt voluntarily ambulated to the nursing station around [place] requesting her medications. Pt reports having VH. Pt reports withdrawal symptoms of sweating and tremors. Ciwa rated at a 3. Haldol, Ativan, and Cogentin administered. Pt denies feeling drowsy from Haldol. Trazodone not given to pt. Pt encouraged to seek writer as needed.

## 2014-11-01 NOTE — [facility] Group Notes (Signed)
[facility] LCSW Aftercare Discharge Planning Group Note   11/01/2014 9:35 AM  Participation Quality:  Invited-DID NOT ATTEND. Pt chose to remain in bed.   [employer]
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
## 2014-11-01 NOTE — BHH Group Notes (Signed)
[place] LCSW Group Therapy  11/01/2014 1:24 PM  Type of Therapy:  Group Therapy  Participation Level:  Did Not Attend-pt invited/chose to remain in bed to rest.   Summary of Progress/Problems: Today's Topic: Overcoming Obstacles. Pts identified obstacles faced currently and processed barriers involved in overcoming these obstacles. Pts identified steps necessary for overcoming these obstacles and explored motivation (internal and external) for facing these difficulties head on. Pts further identified one area of concern in their lives and chose a skill of focus pulled from their "toolbox."   Smart, [place]  11/01/2014, 1:24 PM

## 2014-11-01 NOTE — Tx Team (Signed)
Interdisciplinary Treatment Plan Update (Adult)   Date: 11/01/2014  Time Reviewed: 10:37 AM  Progress in Treatment:  Attending groups: No.  Participating in groups:  No.  Taking medication as prescribed: Yes  Tolerating medication: Yes  Family/Significant othe contact made: No  Patient understands diagnosis: Yes, AEB seeking treatment for SI/overdose attempt, increased depression/grief over mother's death in 07/10/2023, cocaine abuse/marijuana abuse, etoh detox, and for medication stabilization.  Discussing patient identified problems/goals with staff: Yes  Medical problems stabilized or resolved: Yes  Denies suicidal/homicidal ideation: passive SI/Able to contract for safety on the unit.  Patient has not harmed self or Others: Yes  New problem(s) identified:  Discharge Plan or Barriers: Pt did not attend d/c planning group this morning. PSA needed. CSW assessing for appropriate referrals. During last admission (DEC 15, pt was to follow-up with [facility] and [facility] for mental health outpatient services). Pt has been living with a friend.  Additional comments: Cheryl Thompson is an 49 y.o. female. Pt comes to [facility] after intentional overdose. PT reports she took 12 neurontin, 4-5 haldol, 3 lamictal with intent to kill self and then called 911 when she felt like she was going to pass out. Pt reports her mother died in July 26, 2023 and she has been hospitalized 3 times since then, most recent in March when she spent 7 days at [facility]. Twice at [facility] in 07-25-14. Pt also reports significant financial problems, "I'm almost homeless", and that she is not getting along with her siblings. Pt also using significant amount of drugs/alcohol and reports 2 fifth's daily of alcohol, along with daily marijuana and cocaine. Pt does report withdrawals and states she has a seizure disorder. Pt reports no current HI but reports she had HI towards her sibs at the time of her mother's death in 07-26-23. She also  reports she has "visions" of herself killing her sibs and has visual hallucinations of her deceased mother. Reason for Continuation of Hospitalization: Ativan taper-withdrawals Depression/mood instability SI Medication stabilization  Estimated length of stay: 3-5 days  For review of initial/current patient goals, please see plan of care.  Attendees:  Patient:    Family:    Physician: Carlton Adam  MD 11/01/2014 10:42 AM   Nursing: Adonis Huguenin RN; Christa RN 11/01/2014 10:43 AM   Clinical Social Worker [place], [place]  11/01/2014 10:43 AM   Other: Leana Gamer 11/01/2014 10:43 AM   Other: Gerline Legacy Nurse CM 11/01/2014 10:43 AM   Other: Hilda Lias, Community Care Coordinator  11/01/2014 10:43 AM   Other:    Scribe for Treatment Team:  [place] 11/01/2014 10:43 AM

## 2014-11-01 NOTE — Progress Notes (Signed)
Recreation Therapy Notes  Date: 04.18.2016 Time: 9:30am Location: 300 Hall Group Room   Group Topic: Stress Management  Goal Area(s) Addresses:  Patient will actively participate in stress management techniques presented during session.   Behavioral Response: Drowsy  Intervention: Stress management techniques  Activity :  Deep Breathing and Progressive Muscle Relaxation. LRT provided instruction and demonstration on practice of Progressive Muscle Relaxation. Technique was coupled with deep breathing.   Education:  Stress Management, Discharge Planning.   Education Outcome: Acknowledges education  Clinical Observations/Feedback: Patient attended group and attempted to participate in techniques presented, however was observed to fall asleep sitting up multiple times during group. At conclusion of group patient appeared to be asleep, when called on by LRT patient responded but did so with sleepy affect. Patient informed LRT she has recently overdosed and she suspected that was the cause of her sleepiness. Due to patient extremely drowsy appearance LRT encouraged patient to lay down to rest for a bit, patient stood at this time and attempted to walk out of group room, however was so unsteady on her feet she has to brace herself on piano in room. LRT provided patient with support as she walked to her room. RN notified of patient behaviors and concerns post group.  Laureen Ochs Rayven Rettig, LRT/CTRS  Lane Hacker 11/01/2014 3:34 PM

## 2014-11-02 LAB — LAMOTRIGINE LEVEL: LAMOTRIGINE LVL: 1.1 ug/mL — AB (ref 2.0–20.0)

## 2014-11-02 NOTE — BHH Group Notes (Signed)
Patient resting in bed, was invited to group but elected not to attend.

## 2014-11-02 NOTE — BHH Group Notes (Signed)
Adult Psychoeducational Group Note  Date:  11/02/2014 Time:  10:59 PM  Group Topic/Focus:  Wrap-Up Group:   The focus of this group is to help patients review their daily goal of treatment and discuss progress on daily workbooks.  Participation Level:  Minimal  Participation Quality:  Attentive  Affect:  Flat  Cognitive:  Alert  Insight: Limited  Engagement in Group:  Lacking  Modes of Intervention:  Discussion  Additional Comments:  Sathvika stated she slept all day and that she is still seeing bugs.  She expressed that she wants to work on not seeing bugs and staying awake.  Victorino Sparrow A 11/02/2014, 10:59 PM

## 2014-11-02 NOTE — BHH Group Notes (Signed)
[place] LCSW Group Therapy  11/02/2014 1:33 PM  Type of Therapy:  Group Therapy  Participation Level:  Active  Participation Quality:  Attentive  Affect:  Depressed and Flat  Cognitive:  Oriented  Insight:  Improving  Engagement in Therapy:  Improving  Modes of Intervention:  Discussion, Education, Exploration, Problem-solving, Rapport Building, Socialization and Support  Summary of Progress/Problems: [facility] Speaker came to talk about his personal journey with substance abuse and addiction. The pt processed ways by which to relate to the speaker. [place] speaker provided handouts and educational information pertaining to groups and services offered by the [facility].   Smart, Murel Wigle LCSWA 11/02/2014, 1:33 PM

## 2014-11-02 NOTE — Progress Notes (Addendum)
Patient remains lethargic, sedated and unsteady. Feels possibly it's from her neurontin overdose. Patient flat, depressed both in affect and mood. Rates her depression, hopelessness and anxiety at a 10/10. States her goal today is to "stay alive" as she fears she might go to sleep and not wake up. Consulted with Dr. Sabra Heck and verbal instructions to hold various meds that could increase her lethargy. Patient encouraged to push fluids and pitcher was provided. Reviewed fall precautions and patient verbalized understanding. Support and reassurance given. Patient did not attend first group of the day but has attended the others thus far. Endorsing passive SI but contracts for safety. Denies HI/AH but continues to endorse seeing bugs. Will continue to monitor closely. Jamie Kato

## 2014-11-02 NOTE — Plan of Care (Signed)
Problem: Diagnosis: Increased Risk For Suicide Attempt Goal: STG-Patient Will Comply With Medication Regime Outcome: Progressing Patient has been med compliant.  Problem: Alteration in mood Goal: STG-Patient reports thoughts of self-harm to staff Outcome: Progressing Patient endorsing passive SI but contracts verbally for safety.

## 2014-11-02 NOTE — Progress Notes (Signed)
D: Pt continues to report VH of seeing bugs. Pt endorses SI this evening. Pt verbally contracts for safety. Pt presents blunted in affect and depressed in mood. Pt noted for increased activity from the previous evening. Pt did not appeared lethargic but remained slow in musculoskeletal movements. Pt observed talking to the other patients in the dayroom. Pt participated in group.  A: Writer administered scheduled and prn medications to pt. Continued support and availability as needed was extended to this pt. Staff continue to monitor pt with q53min checks.  R: No adverse drug reactions noted. Pt receptive to treatment. Pt remains safe at this time.

## 2014-11-02 NOTE — Clinical Social Work Note (Signed)
CSW met with pt individually to discuss aftercare plan. Pt requesting referral to [facility]. Pt denied at [facility] due to chronic SI/multiple attempts/primary diagnosis being mental illness. [facility] referral sent. Pt also given [facility]/halfway house lists and encouraged to make calls due to potential for long wait-list at [facility]. Pt has follow-up scheduled at [facility] for outpatient med management and therapy. CSW assessing.  [place], [place] 11/02/2014 3:38 PM
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
## 2014-11-03 MED ORDER — FLUCONAZOLE 150 MG PO TABS
150.0000 mg | ORAL_TABLET | Freq: Once | ORAL | Status: AC
  Administered 2014-11-03: 150 mg via ORAL
  Filled 2014-11-03: qty 1

## 2014-11-03 MED ORDER — GABAPENTIN 100 MG PO CAPS: 100.0000 mg | ORAL_CAPSULE | Freq: Three times a day (TID) | ORAL | Status: DC

## 2014-11-03 NOTE — BHH Group Notes (Signed)
[facility] LCSW Aftercare Discharge Planning Group Note   11/03/2014 10:13 AM  Participation Quality:  Minimal   Mood/Affect:  Depressed and Flat  Depression Rating:  10  Anxiety Rating:  10  Thoughts of Suicide:  No Will you contract for safety?   NA  Current AVH:  No  Plan for Discharge/Comments:  Pt reports continued problems with depression and anxiety. Pt reports grogginess "that is getting alittle better." Pt reports some shakes/withdrawals and poor sleep. Pt reports that she has disability appts in a week and was given [employer] by [place] per her request.   [employer]: unknown at this time   Supports: Garment/textile technologist, Research officer, trade union
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
## 2014-11-03 NOTE — Progress Notes (Signed)
Pt did not attend NA group this evening.  

## 2014-11-03 NOTE — BHH Group Notes (Signed)
[place] LCSW Group Therapy  11/03/2014 1:14 PM  Type of Therapy:  Group Therapy  Participation Level:  Did Not Attend-pt invited. Chose to remain in bed.   Summary of Progress/Problems: Today's Topic: Overcoming Obstacles. Pt identified obstacles faced currently and processed barriers involved in overcoming these obstacles. Pt identified steps necessary for overcoming these obstacles and explored motivation (internal and external) for facing these difficulties head on. Pt further identified one area of concern in their lives and chose a skill of focus pulled from their "toolbox."   Smart, [place]  11/03/2014, 1:14 PM

## 2014-11-03 NOTE — Progress Notes (Signed)
Pt has been in her bed much of the day.  She rated both her depression and hopelessness 10 and her anxiety a 9 on her self-inventory.  She still having some passive S/I but no plans here in the hospital and she does verbally contract to come to staff before acting on any self-harm thoughts. She denied any A/V/H and denied any H/I.  She is wanting to go to long-term treatment from here for her detox.

## 2014-11-03 NOTE — Plan of Care (Signed)
Problem: Diagnosis: Increased Risk For Suicide Attempt Goal: STG-Patient Will Report Suicidal Feelings to Staff Outcome: Progressing Pt continues to have passive S/I with no plans here in the hospital and does contract verbally to come to staff before acting on any self-harm thoughts.

## 2014-11-04 NOTE — Progress Notes (Signed)
Patient did attend the evening karaoke group. Pt was engaged, supportive, and participated by singing two songs.   

## 2014-11-04 NOTE — Clinical Social Work Note (Addendum)
CSW spoke with [facility] (admissions coordinator) for [place]. Per Formi, bed available tomorrow 11/05/14 for pt. Admitting MD: Dr. Berneice Gandy. CSW called [facility] back with auth number: [id number] from 4/21-4/27/16. IVC paperwork completed by Dr. Sabra Heck and faxed to [place] at [phone]. CSW spoke with magistrate and confirmed that it was received and she would be served this evening. SGT Pascal notified and stated that he would have someone to pick her up around [place] on Friday 4/22 and would call nursing station/CSW to confirm in the morning. CSW called Formi at [facility] back with this information-Rodney answered/Formi left for the day. Estimated arrival time given to [facility] and he confirmed acceptance. 4 copies of IVC paperwork put in pt chart. Pt notified.   [place], [place] 11/04/2014 3:19 PM   IVC paperwork faxed to [facility] for her records at [place] (delivered by deputy).  [place], [place] 11/04/2014 3:54 PM

## 2014-11-04 NOTE — Progress Notes (Signed)
D.  Pt pleasant but flat affect on approach.  States she will be leaving for long term care tomorrow at [facility].  Pt reports being nervous about going there.  Pt requested medication for sleep and anxiety due to apprehension about discharge.  Denies SI/HI/hallucinatons at this time.  Pt was positive for evening karaoke group, minimal interaction on unit this shift.  A.  Support and encouragement offered, medication given as ordered for anxiety and insomnia.  R.  Pt remains safe on unit, will continue to monitor.

## 2014-11-04 NOTE — BHH Group Notes (Signed)
[place] LCSW Group Therapy  11/04/2014 1:03 PM  Type of Therapy:  Group Therapy  Participation Level:  Did Not Attend-pt invited. Sleeping in room.   Summary of Progress/Problems:  Finding Balance in Life. Today's group focused on defining balance in one's own words, identifying things that can knock one off balance, and exploring healthy ways to maintain balance in life. Group members were asked to provide an example of a time when they felt off balance, describe how they handled that situation,and process healthier ways to regain balance in the future. Group members were asked to share the most important tool for maintaining balance that they learned while at [facility] and how they plan to apply this method after discharge.   Cheryl Thompson, Cheryl Thompson LCSWA  11/04/2014, 1:03 PM

## 2014-11-04 NOTE — Progress Notes (Signed)
Pt alert and oriented. Pt attended morning group with nurses this morning. Pt participated and engaged in conversation. Pt is respectful of others and compliant with unit rules. Pt is also compliant with medications. Pt still reports depression but says she will try to "not be so depressed". Pt denies any SI/HI/AH/VH. Pt reports sleeping well overnight. No current concerns. RN will continue to monitor.
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
## 2014-11-04 NOTE — Clinical Social Work Note (Signed)
Pt has been denied at [facility] due to severity of mental illness/chronic SI/recent overdose attempt. She has aftercare scheduled for [facility]patient psychiatry and med management. Per MD and pt request, [facility] referral sent at 12:00PM today. CSW called to confirm fax was received. Pt in process of being reviewed.   [place], [place] 11/04/2014 12:11 PM

## 2014-11-05 DIAGNOSIS — F332 Major depressive disorder, recurrent severe without psychotic features: Secondary | ICD-10-CM | POA: Insufficient documentation

## 2014-11-05 DIAGNOSIS — F10230 Alcohol dependence with withdrawal, uncomplicated: Secondary | ICD-10-CM | POA: Insufficient documentation

## 2014-11-05 MED ORDER — BENZTROPINE MESYLATE 0.5 MG PO TABS: 0.5000 mg | ORAL_TABLET | ORAL | Status: DC

## 2014-11-05 MED ORDER — HYDROCHLOROTHIAZIDE 25 MG PO TABS: 25.0000 mg | ORAL_TABLET | Freq: Every day | ORAL | Status: DC

## 2014-11-05 MED ORDER — LEVOTHYROXINE SODIUM 25 MCG PO TABS
25.0000 ug | ORAL_TABLET | Freq: Every day | ORAL | Status: DC
Start: 2014-11-05 — End: 2014-12-29

## 2014-11-05 MED ORDER — LAMOTRIGINE 25 MG PO TABS: 25.0000 mg | ORAL_TABLET | Freq: Every day | ORAL | Status: DC

## 2014-11-05 MED ORDER — HALOPERIDOL 5 MG PO TABS: 5.0000 mg | ORAL_TABLET | ORAL | Status: DC

## 2014-11-05 MED ORDER — PANTOPRAZOLE SODIUM 20 MG PO TBEC: 20.0000 mg | DELAYED_RELEASE_TABLET | Freq: Every day | ORAL | Status: DC

## 2014-11-05 MED ORDER — ADULT MULTIVITAMIN W/MINERALS CH: 1.0000 | ORAL_TABLET | Freq: Every day | ORAL | Status: DC

## 2014-11-05 MED ORDER — AMLODIPINE BESYLATE 5 MG PO TABS: 5.0000 mg | ORAL_TABLET | Freq: Every day | ORAL | Status: DC

## 2014-11-05 MED ORDER — TRAZODONE HCL 100 MG PO TABS: 100.0000 mg | ORAL_TABLET | Freq: Every day | ORAL | Status: DC

## 2014-11-05 NOTE — Plan of Care (Signed)
Problem: Diagnosis: Increased Risk For Suicide Attempt Goal: STG-Patient Will Comply With Medication Regime Outcome: Progressing Pt compliant with medications and asked if she will be on the same medications at [facility]

## 2014-11-05 NOTE — Progress Notes (Addendum)
D>:  Patient's self inventory sheet, patient has poor sleep, sleep medication is not helpful.  Good appetite, normal energy level, good concentration.  Rated depression and hopeless 8, anxiety 10.  Denied withdrawals.  Denied SI.   Physical problems, lightheaded, pain, dizziness.    Lower back hurts and knee pain, worst pain #10.    Goal is to go to [facility] in [place], [place].  Plans to leave with a positive attitude.  Feels sleepy because of trazadone last night.  Does have discharge plans.  No problems anticipated after discharge.  A:  Medications administered per MD orders.  Emotional support and encouragement given patient. R:  Denied SI and HI, contracts for safety.  Denied auditory hallucinations.  Visual hallucinations, bugs in her peripheral vision, turns head, and bugs disappear.  Denied pain while talking to nurse this morning.
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
## 2014-11-05 NOTE — Progress Notes (Signed)
Discharge Note:  Patient discharged with [facility] Dept for transport to [place], [place].  Patient is looking forward to her new admission.  Denied SI and HI.  Denied A/V hallucinations.  Suicide prevention information given and reviewed by patient who stated she understood and had no questions.  Patient stated she received all her belongings, clothing, purse, jewelry, coat, cigarettes, medications, hair dryer, etc.  Patient stated she appreciated all assistance received from [facility] staff.
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
## 2014-11-05 NOTE — Progress Notes (Signed)
  [facility] Adult Case Management Discharge Plan :  Will you be returning to the same living situation after discharge:  No. pt accepted to [facility] this afternoon. At discharge, do you have transportation home?: Yes,  [employer] coming this morning to transport pt to facility Do you have the ability to pay for your medications: Yes,  mental health  Release of information consent forms completed and submitted to Medical Records by CSW.  Patient to Follow up at: Follow-up Information    Follow up with [facility] On 11/09/2014.   Why:  Appt on this date at 11:00AM for therapy with Roxan Diesel. Please reschedule if you are inpatient at [facility].    Contact information:   [address], [place] [address] Phone: [phone] Fax: [phone]      Follow up with [facility]  On 11/15/2014.   Why:  Appt on this date at 9:00AM for medication management with Dr. Timmothy Euler. Please reschedule if you are inpatient at [facility].    Contact information:   [address], [place] [address] Phone: [phone] Fax: [phone]      Follow up with [facility] On 11/05/2014.   Why:  You have been accepted for admission on this date. [employer] will transport you to facility in the morning.    Contact information:   [address], [place] [address] Phone: [phone] Fax: [phone]      Patient denies SI/HI: Yes,  during self report.     Safety Planning and Suicide Prevention discussed: Yes,  Contact attempts made with pt's sister. SPE completed with pt and she was provided with SPI pamphlet/mobile crisis info, encouraged to share with support network, ask questions, and talk about any concerns relating to SPE.  Have you used any form of tobacco in the last 30 days? (Cigarettes, Smokeless Tobacco, Cigars, and/or Pipes): Yes  Has patient been referred to the Quitline?: Patient refused referral  Smart, Alicia Amel  11/05/2014, 8:41 AM

## 2014-11-05 NOTE — [facility] Suicide Risk Assessment (Signed)
[facility] INPATIENT:  Family/Significant Other Suicide Prevention Education  Suicide Prevention Education:  Contact Attempts: Shanon Ace (pt's sister) [phone] has been identified by the patient as the family member/significant other with whom the patient will be residing, and identified as the person(s) who will aid the patient in the event of a mental health crisis.  With written consent from the patient, two attempts were made to provide suicide prevention education, prior to and/or following the patient's discharge.  We were unsuccessful in providing suicide prevention education.  A suicide education pamphlet was given to the patient to share with family/significant other. Pt also provided with Mobile Crisis info and was encouraged to share information with her supports, ask questions, and discuss concerns.   Date and time of first attempt: 11/04/14 at 4:15PM (unable to leave voicemail)  Date and time of second attempt: 11/05/14 8:40AM (unable to leave voicemail)   Smart, Hailey Miles LCSWA  11/05/2014, 8:40 AM

## 2014-11-09 NOTE — Progress Notes (Signed)
Patient Discharge Instructions:  After Visit Summary (AVS):   Faxed to:  11/09/14 Discharge Summary Note:   Faxed to:  11/09/14 Psychiatric Admission Assessment Note:   Faxed to:  11/09/14 Suicide Risk Assessment - Discharge Assessment:   Faxed to:  11/09/14 Faxed/Sent to the Next Level Care provider:  11/09/14 Faxed to [facility] @ [phone] Faxed to [facility] @ [place], 11/09/2014, 3:03 PM

## 2014-11-22 ENCOUNTER — Telehealth: Payer: Self-pay | Admitting: *Deleted

## 2014-11-22 NOTE — Telephone Encounter (Signed)
Patient called with updated contact information. She has been in-patient for substance abuse.  She is now at [facility] for the next 6 months or so.  Patient is able to come to the office. She was discharged with 1 week of medication, will be out soon (Truvada, Norvir, Prezista). Patient given appointment for Wednesday morning with Marijean Niemann and Pam for [employer].  Patient will make lab/doctor appointment when she is in the office.  She is dependent on transportation from [employer]. Last labs in February 2016 - patient was undetectable. Landis Gandy, RN

## 2014-11-24 ENCOUNTER — Ambulatory Visit: Payer: Self-pay

## 2014-11-24 ENCOUNTER — Other Ambulatory Visit: Payer: Self-pay | Admitting: Licensed Clinical Social Worker

## 2014-11-24 DIAGNOSIS — B20 Human immunodeficiency virus [HIV] disease: Secondary | ICD-10-CM

## 2014-11-24 MED ORDER — RITONAVIR 100 MG PO TABS: 100.0000 mg | ORAL_TABLET | Freq: Every day | ORAL | Status: DC

## 2014-11-24 MED ORDER — EMTRICITABINE-TENOFOVIR DF 200-300 MG PO TABS: 1.0000 | ORAL_TABLET | Freq: Every day | ORAL | Status: DC

## 2014-11-24 MED ORDER — DARUNAVIR ETHANOLATE 800 MG PO TABS: 800.0000 mg | ORAL_TABLET | Freq: Every day | ORAL | Status: DC

## 2014-12-17 ENCOUNTER — Other Ambulatory Visit: Payer: Self-pay | Admitting: Internal Medicine

## 2014-12-17 DIAGNOSIS — B20 Human immunodeficiency virus [HIV] disease: Secondary | ICD-10-CM

## 2014-12-17 NOTE — Telephone Encounter (Signed)
Refill rxes

## 2014-12-21 ENCOUNTER — Other Ambulatory Visit: Payer: Self-pay | Admitting: *Deleted

## 2014-12-21 NOTE — Telephone Encounter (Signed)
Error. Medication called in already.

## 2014-12-28 ENCOUNTER — Encounter: Payer: Self-pay | Admitting: Family Medicine

## 2014-12-28 ENCOUNTER — Ambulatory Visit (INDEPENDENT_AMBULATORY_CARE_PROVIDER_SITE_OTHER): Payer: Federal, State, Local not specified - Other | Admitting: Family Medicine

## 2014-12-28 VITALS — BP 136/87 | HR 66 | Temp 98.1°F | Resp 14 | Ht 69.0 in | Wt 173.0 lb

## 2014-12-28 DIAGNOSIS — F17200 Nicotine dependence, unspecified, uncomplicated: Secondary | ICD-10-CM

## 2014-12-28 DIAGNOSIS — F1991 Other psychoactive substance use, unspecified, in remission: Secondary | ICD-10-CM | POA: Insufficient documentation

## 2014-12-28 DIAGNOSIS — I10 Essential (primary) hypertension: Secondary | ICD-10-CM

## 2014-12-28 DIAGNOSIS — Z87898 Personal history of other specified conditions: Secondary | ICD-10-CM | POA: Insufficient documentation

## 2014-12-28 DIAGNOSIS — E039 Hypothyroidism, unspecified: Secondary | ICD-10-CM

## 2014-12-28 DIAGNOSIS — H65193 Other acute nonsuppurative otitis media, bilateral: Secondary | ICD-10-CM

## 2014-12-28 DIAGNOSIS — B20 Human immunodeficiency virus [HIV] disease: Secondary | ICD-10-CM

## 2014-12-28 DIAGNOSIS — F1921 Other psychoactive substance dependence, in remission: Secondary | ICD-10-CM

## 2014-12-28 LAB — BASIC METABOLIC PANEL
BUN: 12 mg/dL (ref 6–23)
CO2: 27 meq/L (ref 19–32)
Calcium: 9.9 mg/dL (ref 8.4–10.5)
Chloride: 103 mEq/L (ref 96–112)
Creat: 0.89 mg/dL (ref 0.50–1.10)
Glucose, Bld: 83 mg/dL (ref 70–99)
POTASSIUM: 4.2 meq/L (ref 3.5–5.3)
Sodium: 137 mEq/L (ref 135–145)

## 2014-12-28 LAB — POCT URINALYSIS DIP (DEVICE)
Bilirubin Urine: NEGATIVE
Glucose, UA: NEGATIVE mg/dL
Hgb urine dipstick: NEGATIVE
Ketones, ur: NEGATIVE mg/dL
Leukocytes, UA: NEGATIVE
NITRITE: NEGATIVE
Protein, ur: NEGATIVE mg/dL
Specific Gravity, Urine: 1.015 (ref 1.005–1.030)
UROBILINOGEN UA: 0.2 mg/dL (ref 0.0–1.0)
pH: 5 (ref 5.0–8.0)

## 2014-12-28 MED ORDER — AZITHROMYCIN 500 MG PO TABS: 500.0000 mg | ORAL_TABLET | Freq: Every day | ORAL | Status: AC
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
## 2014-12-28 NOTE — Patient Instructions (Signed)

## 2014-12-29 ENCOUNTER — Other Ambulatory Visit: Payer: Self-pay | Admitting: Family Medicine

## 2014-12-29 DIAGNOSIS — E039 Hypothyroidism, unspecified: Secondary | ICD-10-CM

## 2014-12-29 LAB — TSH: TSH: 8.604 u[IU]/mL — AB (ref 0.350–4.500)

## 2014-12-29 MED ORDER — LEVOTHYROXINE SODIUM 25 MCG PO TABS: 25.0000 ug | ORAL_TABLET | Freq: Every day | ORAL | Status: DC

## 2014-12-29 NOTE — Progress Notes (Signed)
[name], a patient with a history of hypothyroidism presented on 12/28/2014. Reviewed labs TSH elevated. Patient reports that she has not taken Synthroid in 3 weeks. Sent Rx to pharmacy, will check TSH in 6 weeks.   Meds ordered this encounter  Medications  . levothyroxine (SYNTHROID, LEVOTHROID) 25 MCG tablet    Sig: Take 1 tablet (25 mcg total) by mouth daily before breakfast. (Do not take wit other medications): For low thyroid function    Dispense:  30 tablet    Refill:  1    Order Specific Question:  Supervising Provider    Answer:  Leana Gamer [[id number]]  '  [name], FNP

## 2014-12-30 NOTE — Progress Notes (Signed)
Called and reviewed with patient to start taking synthroid 18mcg as directed and to return to clinic in 6 to 8 weeks for repeat tsh. Thanks!

## 2014-12-31 ENCOUNTER — Other Ambulatory Visit: Payer: Self-pay

## 2014-12-31 DIAGNOSIS — E039 Hypothyroidism, unspecified: Secondary | ICD-10-CM

## 2014-12-31 MED ORDER — LEVOTHYROXINE SODIUM 25 MCG PO TABS: 25.0000 ug | ORAL_TABLET | Freq: Every day | ORAL | Status: DC

## 2014-12-31 MED ORDER — HYDROCHLOROTHIAZIDE 25 MG PO TABS: 25.0000 mg | ORAL_TABLET | Freq: Every day | ORAL | Status: DC

## 2015-01-11 ENCOUNTER — Telehealth: Payer: Self-pay | Admitting: Licensed Clinical Social Worker

## 2015-01-11 NOTE — Telephone Encounter (Signed)
[facility] unable to reach the patient at the number listed, there is a note stating that she is in substance abuse home. If patient calls please have her call [facility] to ok delivery.

## 2015-01-25 ENCOUNTER — Ambulatory Visit (INDEPENDENT_AMBULATORY_CARE_PROVIDER_SITE_OTHER): Payer: Federal, State, Local not specified - Other | Admitting: Family Medicine

## 2015-01-25 VITALS — BP 126/78 | HR 69 | Temp 98.1°F | Resp 16 | Ht 69.0 in | Wt 180.0 lb

## 2015-01-25 DIAGNOSIS — H65192 Other acute nonsuppurative otitis media, left ear: Secondary | ICD-10-CM

## 2015-01-25 MED ORDER — SULFAMETHOXAZOLE-TRIMETHOPRIM 800-160 MG PO TABS: 1.0000 | ORAL_TABLET | Freq: Two times a day (BID) | ORAL | Status: DC

## 2015-01-25 MED ORDER — FLUTICASONE PROPIONATE 50 MCG/ACT NA SUSP: 2.0000 | Freq: Every day | NASAL | Status: DC

## 2015-01-25 NOTE — Patient Instructions (Signed)
Flonase nasal spray 2 sprays in each nostril once a day Septra DS, one po BID x 10 days Follow-up if symptoms not improving. Follow-up with other providers as planned.

## 2015-01-25 NOTE — Progress Notes (Signed)
Subjective:     Patient ID: Cheryl Thompson, female   DOB: February 14, 1966, 49 y.o.   MRN: [id number]  HPI   Patient is here today complaining of bilateral ear discomfort, left greater than right. She describes as a dull ache and a feeling of fullness and some decrease in hearing. She was seen here on June 14th and was treated with Zithromax, which did not resolve the symptoms. She does admit to nasal and sinus congestion  Review of Systems   Denies fever, chills, wt loss or appetite loss Denies eye pain or drainage. Positive for ear pain and nasal congestion Denies neck pain or lymphadnopathy.     Objective:   Physical Exam  Alert, oriented, appropriate Skin warm and dry TM on left is red and slightly convex. Left appears normal Neck is supple FROM w/o adenopathy or tenderness Lungs are clear to auscultation HS are regular w/o m,g,r      Assessment:     Otitis media -As she is allergic to penicilin will prescribe Septra DS #20, one po bid for 10 days -May use OTC analgestics for pain. -Follow-up if symptoms not resolving    Micheline Chapman, FNP-BC

## 2015-02-09 ENCOUNTER — Encounter: Payer: Self-pay | Admitting: Family Medicine

## 2015-02-09 ENCOUNTER — Ambulatory Visit (INDEPENDENT_AMBULATORY_CARE_PROVIDER_SITE_OTHER): Payer: Federal, State, Local not specified - Other | Admitting: Family Medicine

## 2015-02-09 VITALS — BP 124/76 | HR 69 | Temp 98.2°F | Resp 16 | Ht 69.0 in | Wt 186.0 lb

## 2015-02-09 DIAGNOSIS — G894 Chronic pain syndrome: Secondary | ICD-10-CM
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
## 2015-02-09 NOTE — Patient Instructions (Signed)
Because of your allergy to NSAIDs and you cannot take narcotics. The only thing I can recommend is occassional use of tylenol. We notify you of lab results and attempt referral to rheumatologist.. Warm baths or compresses may help

## 2015-02-10 LAB — SEDIMENTATION RATE: Sed Rate: 44 mm/hr — ABNORMAL HIGH (ref 0–20)

## 2015-02-10 LAB — RHEUMATOID FACTOR: Rhuematoid fact SerPl-aCnc: <10 IU/mL (ref <=14)

## 2015-02-10 LAB — ANA: ANA: NEGATIVE

## 2015-02-10 LAB — URIC ACID: URIC ACID, SERUM: 5.6 mg/dL (ref 2.4–7.0)

## 2015-02-10 LAB — C-REACTIVE PROTEIN: CRP: <0.5 mg/dL (ref <0.60)

## 2015-02-18 ENCOUNTER — Telehealth: Payer: Self-pay | Admitting: General Practice

## 2015-02-18 NOTE — Telephone Encounter (Signed)
Received request for medical records from [facility]. Forwarded to [employer].

## 2015-02-22 ENCOUNTER — Encounter: Payer: Self-pay | Admitting: Internal Medicine

## 2015-02-22 ENCOUNTER — Ambulatory Visit (INDEPENDENT_AMBULATORY_CARE_PROVIDER_SITE_OTHER): Payer: Self-pay | Admitting: Internal Medicine

## 2015-02-22 VITALS — BP 120/87 | HR 73 | Temp 97.8°F | Ht 69.5 in | Wt 187.0 lb

## 2015-02-22 DIAGNOSIS — Z23 Encounter for immunization: Secondary | ICD-10-CM

## 2015-02-22 DIAGNOSIS — M2550 Pain in unspecified joint: Secondary | ICD-10-CM

## 2015-02-22 DIAGNOSIS — Z79899 Other long term (current) drug therapy: Secondary | ICD-10-CM

## 2015-02-22 DIAGNOSIS — B20 Human immunodeficiency virus [HIV] disease: Secondary | ICD-10-CM

## 2015-02-22 DIAGNOSIS — Z113 Encounter for screening for infections with a predominantly sexual mode of transmission: Secondary | ICD-10-CM

## 2015-02-22 LAB — CBC WITH DIFFERENTIAL/PLATELET
Basophils Absolute: 0 10*3/uL (ref 0.0–0.1)
Basophils Relative: 0 % (ref 0–1)
Eosinophils Absolute: 0.3 10*3/uL (ref 0.0–0.7)
Eosinophils Relative: 3 % (ref 0–5)
HEMATOCRIT: 37.9 % (ref 36.0–46.0)
Hemoglobin: 13 g/dL (ref 12.0–15.0)
LYMPHS ABS: 3.4 10*3/uL (ref 0.7–4.0)
Lymphocytes Relative: 39 % (ref 12–46)
MCH: 32.2 pg (ref 26.0–34.0)
MCHC: 34.3 g/dL (ref 30.0–36.0)
MCV: 93.8 fL (ref 78.0–100.0)
MONOS PCT: 11 % (ref 3–12)
MPV: 13.4 fL — ABNORMAL HIGH (ref 8.6–12.4)
Monocytes Absolute: 1 10*3/uL (ref 0.1–1.0)
NEUTROS ABS: 4.1 10*3/uL (ref 1.7–7.7)
NEUTROS PCT: 47 % (ref 43–77)
Platelets: 136 10*3/uL — ABNORMAL LOW (ref 150–400)
RBC: 4.04 MIL/uL (ref 3.87–5.11)
RDW: 13.9 % (ref 11.5–15.5)
WBC: 8.7 10*3/uL (ref 4.0–10.5)

## 2015-02-22 LAB — LIPID PANEL
Cholesterol: 161 mg/dL (ref 125–200)
HDL: 24 mg/dL — ABNORMAL LOW (ref >=46)
LDL Cholesterol: 67 mg/dL (ref <130)
Total CHOL/HDL Ratio: 6.7 Ratio — ABNORMAL HIGH (ref <=5.0)
Triglycerides: 352 mg/dL — ABNORMAL HIGH (ref <150)
VLDL: 70 mg/dL — ABNORMAL HIGH (ref <30)

## 2015-02-22 LAB — COMPLETE METABOLIC PANEL WITH GFR
ALBUMIN: 4 g/dL (ref 3.6–5.1)
ALT: 148 U/L — ABNORMAL HIGH (ref 6–29)
AST: 182 U/L — AB (ref 10–35)
Alkaline Phosphatase: 79 U/L (ref 33–115)
BILIRUBIN TOTAL: 0.3 mg/dL (ref 0.2–1.2)
BUN: 11 mg/dL (ref 7–25)
CO2: 27 mmol/L (ref 20–31)
Calcium: 9.7 mg/dL (ref 8.6–10.2)
Chloride: 101 mmol/L (ref 98–110)
Creat: 0.89 mg/dL (ref 0.50–1.10)
GFR, Est African American: 88 mL/min (ref >=60)
GFR, Est Non African American: 76 mL/min (ref >=60)
GLUCOSE: 83 mg/dL (ref 65–99)
POTASSIUM: 4.7 mmol/L (ref 3.5–5.3)
SODIUM: 140 mmol/L (ref 135–146)
TOTAL PROTEIN: 9 g/dL — AB (ref 6.1–8.1)

## 2015-02-22 MED ORDER — EMTRICITABINE-TENOFOVIR AF 200-25 MG PO TABS: 1.0000 | ORAL_TABLET | Freq: Every day | ORAL | Status: DC

## 2015-02-22 MED ORDER — RITONAVIR 100 MG PO TABS: 100.0000 mg | ORAL_TABLET | Freq: Every day | ORAL | Status: DC

## 2015-02-22 MED ORDER — DARUNAVIR ETHANOLATE 800 MG PO TABS: 800.0000 mg | ORAL_TABLET | Freq: Every day | ORAL | Status: DC

## 2015-02-22 NOTE — Progress Notes (Signed)
  Subjective:    Patient ID: Cheryl Thompson, female    DOB: Mar 25, 1966, 49 y.o.   MRN: [id number]  HPI Comes in here for followup of her HIV. After a long history of poor compliance related to drug and alcohol abuse she had restarted Prezista, Norvir and Truvada last year.  She came in September 2015 on medication and was undetectable.  She relapsed again with substance abuse but has again been drug and alcohol free for the last 4 months.  Overall she feels well though continues to have arthralgias.  No weight loss, no diarrhea.  Has been on medication since, though good compliance since being drug and alcohol free.        Review of Systems  Constitutional: Negative for fatigue.  HENT: Negative for sore throat and trouble swallowing.   Respiratory: Negative for cough and shortness of breath.   Cardiovascular: Negative for chest pain and leg swelling.  Gastrointestinal: Negative for nausea and diarrhea.  Musculoskeletal: Positive for back pain and gait problem. Negative for myalgias.  Skin: Negative for rash.  Neurological: Positive for weakness. Negative for dizziness, light-headedness and headaches.  Hematological: Negative for adenopathy.  Psychiatric/Behavioral: Negative for sleep disturbance.       Objective:   Physical Exam  Constitutional: She appears well-developed and well-nourished. No distress.  HENT:  Mouth/Throat: No oropharyngeal exudate.  Eyes: Right eye exhibits no discharge. Left eye exhibits no discharge. No scleral icterus.  Cardiovascular: Normal rate, regular rhythm and normal heart sounds.   No murmur heard. Pulmonary/Chest: Effort normal and breath sounds normal. No respiratory distress. She has no wheezes.  Lymphadenopathy:    She has no cervical adenopathy.  Skin: Skin is warm and dry. No rash noted. No erythema.          Assessment & Plan:

## 2015-02-22 NOTE — Assessment & Plan Note (Signed)
Hopefully doing well, will check labs today and change her to descovy.  RTC 3-4 months unless concerns.

## 2015-02-22 NOTE — Assessment & Plan Note (Signed)
Not sure of etiology.  May have some improvement off of Descovy

## 2015-02-23 LAB — URINE CYTOLOGY ANCILLARY ONLY
CHLAMYDIA, DNA PROBE: NEGATIVE
Neisseria Gonorrhea: NEGATIVE

## 2015-02-23 LAB — T-HELPER CELL (CD4) - (RCID CLINIC ONLY)
CD4 T CELL HELPER: 35 % (ref 33–55)
CD4 T Cell Abs: 1150 /uL (ref 400–2700)

## 2015-02-23 LAB — RPR

## 2015-02-24 LAB — HIV-1 RNA QUANT-NO REFLEX-BLD: HIV 1 RNA Quant: <20 copies/mL (ref <20)

## 2015-02-28 ENCOUNTER — Telehealth: Payer: Self-pay | Admitting: Family Medicine

## 2015-02-28 ENCOUNTER — Other Ambulatory Visit: Payer: Self-pay | Admitting: Family Medicine

## 2015-02-28 MED ORDER — LEVETIRACETAM 500 MG PO TABS: 500.0000 mg | ORAL_TABLET | Freq: Two times a day (BID) | ORAL | Status: DC

## 2015-02-28 MED ORDER — HYDROCHLOROTHIAZIDE 25 MG PO TABS: 25.0000 mg | ORAL_TABLET | Freq: Every day | ORAL | Status: DC

## 2015-02-28 NOTE — Telephone Encounter (Signed)
Refills sent into pharmacy. Thanks!  

## 2015-03-01 NOTE — Addendum Note (Signed)
Addended by: Landis Gandy on: 03/01/2015 05:25 PM   Modules accepted: Orders

## 2015-03-22 ENCOUNTER — Telehealth: Payer: Self-pay | Admitting: Internal Medicine

## 2015-03-22 NOTE — Telephone Encounter (Signed)
Patient called complaining of diarrhea every 15 to 20 mins, denies fever. Unable to keep any food down but is sipping on fluids. I advised the patient she could try imodium or pepto bismol, but she may need to go to urgent care if her symptoms do not resolve. Patient agreed.

## 2015-03-28 ENCOUNTER — Ambulatory Visit: Payer: Self-pay | Admitting: Family Medicine

## 2015-03-31 ENCOUNTER — Ambulatory Visit (INDEPENDENT_AMBULATORY_CARE_PROVIDER_SITE_OTHER): Payer: Federal, State, Local not specified - Other | Admitting: Family Medicine

## 2015-03-31 ENCOUNTER — Encounter: Payer: Self-pay | Admitting: Family Medicine

## 2015-03-31 VITALS — BP 133/76 | HR 72 | Temp 98.1°F | Resp 18 | Wt 187.0 lb

## 2015-03-31 DIAGNOSIS — I10 Essential (primary) hypertension: Secondary | ICD-10-CM

## 2015-03-31 DIAGNOSIS — E039 Hypothyroidism, unspecified: Secondary | ICD-10-CM

## 2015-03-31 DIAGNOSIS — G629 Polyneuropathy, unspecified: Secondary | ICD-10-CM

## 2015-03-31 DIAGNOSIS — M25579 Pain in unspecified ankle and joints of unspecified foot: Secondary | ICD-10-CM

## 2015-03-31 LAB — BASIC METABOLIC PANEL
BUN: 12 mg/dL (ref 7–25)
CO2: 29 mmol/L (ref 20–31)
Calcium: 9.6 mg/dL (ref 8.6–10.2)
Chloride: 102 mmol/L (ref 98–110)
Creat: 0.87 mg/dL (ref 0.50–1.10)
GLUCOSE: 111 mg/dL — AB (ref 65–99)
POTASSIUM: 3.7 mmol/L (ref 3.5–5.3)
Sodium: 140 mmol/L (ref 135–146)

## 2015-03-31 MED ORDER — GABAPENTIN 100 MG PO CAPS: 100.0000 mg | ORAL_CAPSULE | Freq: Two times a day (BID) | ORAL | Status: DC

## 2015-03-31 MED ORDER — ACETAMINOPHEN 500 MG PO TABS: 500.0000 mg | ORAL_TABLET | Freq: Four times a day (QID) | ORAL | Status: DC | PRN
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
## 2015-03-31 NOTE — Patient Instructions (Signed)

## 2015-04-01 LAB — TSH: TSH: 4.718 u[IU]/mL — ABNORMAL HIGH (ref 0.350–4.500)

## 2015-04-08 ENCOUNTER — Telehealth: Payer: Self-pay | Admitting: Family Medicine

## 2015-04-08 NOTE — Telephone Encounter (Signed)
    Patient calling for lab results 

## 2015-04-11 NOTE — Telephone Encounter (Signed)
Left message today at 8:28am advising patient of improved labs and that provider hasn't requested anything be changed and to call back if any questions. Thanks!

## 2015-04-18 ENCOUNTER — Ambulatory Visit (INDEPENDENT_AMBULATORY_CARE_PROVIDER_SITE_OTHER): Payer: Self-pay | Admitting: Family Medicine

## 2015-04-18 VITALS — BP 123/79 | HR 80 | Temp 98.3°F | Resp 16 | Ht 69.0 in | Wt 192.0 lb

## 2015-04-18 DIAGNOSIS — M25572 Pain in left ankle and joints of left foot: Secondary | ICD-10-CM

## 2015-04-18 DIAGNOSIS — E039 Hypothyroidism, unspecified: Secondary | ICD-10-CM

## 2015-04-18 DIAGNOSIS — M25562 Pain in left knee: Secondary | ICD-10-CM

## 2015-04-18 DIAGNOSIS — I10 Essential (primary) hypertension: Secondary | ICD-10-CM

## 2015-04-18 DIAGNOSIS — M25561 Pain in right knee: Secondary | ICD-10-CM

## 2015-04-18 DIAGNOSIS — M25571 Pain in right ankle and joints of right foot: Secondary | ICD-10-CM

## 2015-04-18 DIAGNOSIS — F17200 Nicotine dependence, unspecified, uncomplicated: Secondary | ICD-10-CM

## 2015-04-18 MED ORDER — KETOROLAC TROMETHAMINE 15 MG/ML IJ SOLN: 15.0000 mg | Freq: Once | INTRAMUSCULAR | Status: DC

## 2015-04-18 MED ORDER — KETOROLAC TROMETHAMINE 60 MG/2ML IM SOLN
15.0000 mg | Freq: Once | INTRAMUSCULAR | Status: AC
  Administered 2015-04-18: 15 mg via INTRAMUSCULAR

## 2015-04-18 MED ORDER — LEVOTHYROXINE SODIUM 25 MCG PO TABS: 25.0000 ug | ORAL_TABLET | Freq: Every day | ORAL | Status: DC

## 2015-04-18 MED ORDER — MELOXICAM 7.5 MG PO TABS: 7.5000 mg | ORAL_TABLET | Freq: Every day | ORAL | Status: DC
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
## 2015-04-19 ENCOUNTER — Encounter: Payer: Self-pay | Admitting: Family Medicine

## 2015-04-19 ENCOUNTER — Telehealth: Payer: Self-pay

## 2015-04-19 LAB — SEDIMENTATION RATE: SED RATE: 55 mm/h — AB (ref 0–20)

## 2015-04-19 NOTE — Telephone Encounter (Signed)
-----   Message from [name], FNP sent at 04/19/2015  7:45 AM EDT ----- Please inform Cheryl Thompson that sedimentation rate is elevated at 55, indicative of arthritic changes. Will continue anti-inflammatory. Please have patient return in 1 month for labs to check renal functioning. I will place the lab order.   Thanks

## 2015-04-19 NOTE — Telephone Encounter (Signed)
Left message advising patient of elevated Sed rate and to continue antiinflammatory as prescribed. Asked patient to call back if any questions and to call back to schedule lab work in 1 month. Thanks!

## 2015-04-28 ENCOUNTER — Ambulatory Visit: Payer: Self-pay | Admitting: Family Medicine

## 2015-05-26 ENCOUNTER — Ambulatory Visit (INDEPENDENT_AMBULATORY_CARE_PROVIDER_SITE_OTHER): Payer: Self-pay | Admitting: Internal Medicine

## 2015-05-26 ENCOUNTER — Encounter: Payer: Self-pay | Admitting: Internal Medicine

## 2015-05-26 VITALS — BP 131/85 | HR 63 | Temp 97.8°F | Ht 69.0 in | Wt 189.0 lb

## 2015-05-26 DIAGNOSIS — F1020 Alcohol dependence, uncomplicated: Secondary | ICD-10-CM

## 2015-05-26 DIAGNOSIS — B20 Human immunodeficiency virus [HIV] disease: Secondary | ICD-10-CM

## 2015-05-27 LAB — T-HELPER CELL (CD4) - (RCID CLINIC ONLY)
CD4 T CELL ABS: 1230 /uL (ref 400–2700)
CD4 T CELL HELPER: 34 % (ref 33–55)

## 2015-05-27 NOTE — Assessment & Plan Note (Signed)
Continuing to do well and encouraged her to keep on this path.

## 2015-05-27 NOTE — Assessment & Plan Note (Signed)
Doing well and will check her labs today.  RTC 4 months unless concerns.

## 2015-05-27 NOTE — Progress Notes (Signed)
  Subjective:    Patient ID: Cheryl Thompson, female    DOB: Aug 27, 1965, 49 y.o.   MRN: [id number]  HPI Comes in here for followup of her HIV.    After a long history of poor compliance related to drug and alcohol abuse she had restarted Prezista, Norvir and Truvada last year.  She came in September 2015 on medication and was undetectable.  She relapsed again with substance abuse but has again been drug and alcohol free now for 7 months.  Overall she feels well though continues to have arthralgias.  Diagnosed with peripheral neuropathy by her PCP.  No weight loss, no diarrhea.        Review of Systems  Constitutional: Negative for fatigue.  HENT: Negative for trouble swallowing.   Gastrointestinal: Negative for nausea and diarrhea.  Musculoskeletal: Positive for arthralgias.  Skin: Negative for rash.  Neurological: Negative for dizziness and headaches.  Hematological: Negative for adenopathy.       Objective:   Physical Exam  Constitutional: She appears well-developed and well-nourished. No distress.  HENT:  Mouth/Throat: No oropharyngeal exudate.  Eyes: Right eye exhibits no discharge. Left eye exhibits no discharge. No scleral icterus.  Cardiovascular: Normal rate, regular rhythm and normal heart sounds.   No murmur heard. Lymphadenopathy:    She has no cervical adenopathy.  Skin: Skin is warm and dry. No erythema.          Assessment & Plan:

## 2015-05-31 ENCOUNTER — Telehealth: Payer: Self-pay | Admitting: *Deleted

## 2015-05-31 LAB — HIV-1 RNA QUANT-NO REFLEX-BLD

## 2015-05-31 NOTE — Telephone Encounter (Signed)
Left message on voicemail. Landis Gandy, RN

## 2015-05-31 NOTE — Telephone Encounter (Signed)
-----   Message from Thayer Headings, MD sent at 05/31/2015 11:11 AM EST ----- Please let her know she continues to do good, completely undetectable HIV still.  thanks

## 2015-06-23 ENCOUNTER — Encounter: Payer: Self-pay | Admitting: Family Medicine

## 2015-06-23 ENCOUNTER — Ambulatory Visit (INDEPENDENT_AMBULATORY_CARE_PROVIDER_SITE_OTHER): Payer: Self-pay | Admitting: Family Medicine

## 2015-06-23 VITALS — BP 141/87 | HR 64 | Temp 98.3°F | Resp 16 | Ht 69.0 in | Wt 197.0 lb

## 2015-06-23 DIAGNOSIS — M25572 Pain in left ankle and joints of left foot: Secondary | ICD-10-CM

## 2015-06-23 DIAGNOSIS — M25571 Pain in right ankle and joints of right foot: Secondary | ICD-10-CM

## 2015-06-23 DIAGNOSIS — M25562 Pain in left knee: Secondary | ICD-10-CM

## 2015-06-23 DIAGNOSIS — J069 Acute upper respiratory infection, unspecified: Secondary | ICD-10-CM

## 2015-06-23 DIAGNOSIS — M25561 Pain in right knee: Secondary | ICD-10-CM

## 2015-06-23 MED ORDER — MELOXICAM 7.5 MG PO TABS: 7.5000 mg | ORAL_TABLET | Freq: Every day | ORAL | Status: DC

## 2015-06-23 MED ORDER — AZITHROMYCIN 250 MG PO TABS: ORAL_TABLET | ORAL | Status: DC
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
## 2015-06-23 NOTE — Patient Instructions (Addendum)
Start Azithrymycin 500 mg today; Days 2-5 take 250 mg Increase fluid intake, rest and handwashing. Upper Respiratory Infection, Adult Most upper respiratory infections (URIs) are a viral infection of the air passages leading to the lungs. A URI affects the nose, throat, and upper air passages. The most common type of URI is nasopharyngitis and is typically referred to as "the common cold." URIs run their course and usually go away on their own. Most of the time, a URI does not require medical attention, but sometimes a bacterial infection in the upper airways can follow a viral infection. This is called a secondary infection. Sinus and middle ear infections are common types of secondary upper respiratory infections. Bacterial pneumonia can also complicate a URI. A URI can worsen asthma and chronic obstructive pulmonary disease (COPD). Sometimes, these complications can require emergency medical care and may be life threatening.  CAUSES Almost all URIs are caused by viruses. A virus is a type of germ and can spread from one person to another.  RISKS FACTORS You may be at risk for a URI if:   You smoke.   You have chronic heart or lung disease.  You have a weakened defense (immune) system.   You are very young or very old.   You have nasal allergies or asthma.  You work in crowded or poorly ventilated areas.  You work in health care facilities or schools. SIGNS AND SYMPTOMS  Symptoms typically develop 2-3 days after you come in contact with a cold virus. Most viral URIs last 7-10 days. However, viral URIs from the influenza virus (flu virus) can last 14-18 days and are typically more severe. Symptoms may include:   Runny or stuffy (congested) nose.   Sneezing.   Cough.   Sore throat.   Headache.   Fatigue.   Fever.   Loss of appetite.   Pain in your forehead, behind your eyes, and over your cheekbones (sinus pain).  Muscle aches.  DIAGNOSIS  Your health care  provider may diagnose a URI by:  Physical exam.  Tests to check that your symptoms are not due to another condition such as:  Strep throat.  Sinusitis.  Pneumonia.  Asthma. TREATMENT  A URI goes away on its own with time. It cannot be cured with medicines, but medicines may be prescribed or recommended to relieve symptoms. Medicines may help:  Reduce your fever.  Reduce your cough.  Relieve nasal congestion. HOME CARE INSTRUCTIONS   Take medicines only as directed by your health care provider.   Gargle warm saltwater or take cough drops to comfort your throat as directed by your health care provider.  Use a warm mist humidifier or inhale steam from a shower to increase air moisture. This may make it easier to breathe.  Drink enough fluid to keep your urine clear or pale yellow.   Eat soups and other clear broths and maintain good nutrition.   Rest as needed.   Return to work when your temperature has returned to normal or as your health care provider advises. You may need to stay home longer to avoid infecting others. You can also use a face mask and careful hand washing to prevent spread of the virus.  Increase the usage of your inhaler if you have asthma.   Do not use any tobacco products, including cigarettes, chewing tobacco, or electronic cigarettes. If you need help quitting, ask your health care provider. PREVENTION  The best way to protect yourself from getting a cold  is to practice good hygiene.   Avoid oral or hand contact with people with cold symptoms.   Wash your hands often if contact occurs.  There is no clear evidence that vitamin C, vitamin E, echinacea, or exercise reduces the chance of developing a cold. However, it is always recommended to get plenty of rest, exercise, and practice good nutrition.  SEEK MEDICAL CARE IF:   You are getting worse rather than better.   Your symptoms are not controlled by medicine.   You have chills.  You  have worsening shortness of breath.  You have brown or red mucus.  You have yellow or brown nasal discharge.  You have pain in your face, especially when you bend forward.  You have a fever.  You have swollen neck glands.  You have pain while swallowing.  You have white areas in the back of your throat. SEEK IMMEDIATE MEDICAL CARE IF:   You have severe or persistent:  Headache.  Ear pain.  Sinus pain.  Chest pain.  You have chronic lung disease and any of the following:  Wheezing.  Prolonged cough.  Coughing up blood.  A change in your usual mucus.  You have a stiff neck.  You have changes in your:  Vision.  Hearing.  Thinking.  Mood. MAKE SURE YOU:   Understand these instructions.  Will watch your condition.  Will get help right away if you are not doing well or get worse.   This information is not intended to replace advice given to you by your health care provider. Make sure you discuss any questions you have with your health care provider.   Document Released: 12/26/2000 Document Revised: 11/16/2014 Document Reviewed: 10/07/2013 Elsevier Interactive Patient Education [employer].

## 2015-07-17 DIAGNOSIS — K5790 Diverticulosis of intestine, part unspecified, without perforation or abscess without bleeding: Secondary | ICD-10-CM

## 2015-07-17 DIAGNOSIS — K5792 Diverticulitis of intestine, part unspecified, without perforation or abscess without bleeding: Secondary | ICD-10-CM

## 2015-07-17 HISTORY — DX: Diverticulitis of intestine, part unspecified, without perforation or abscess without bleeding: K57.92

## 2015-07-17 HISTORY — DX: Diverticulosis of intestine, part unspecified, without perforation or abscess without bleeding: K57.90

## 2015-08-16 ENCOUNTER — Encounter: Payer: Self-pay | Admitting: Family Medicine

## 2015-08-16 ENCOUNTER — Ambulatory Visit (INDEPENDENT_AMBULATORY_CARE_PROVIDER_SITE_OTHER): Payer: Self-pay | Admitting: Family Medicine

## 2015-08-16 VITALS — BP 127/89 | HR 66 | Temp 97.7°F | Resp 16 | Ht 69.0 in | Wt 195.0 lb

## 2015-08-16 DIAGNOSIS — E039 Hypothyroidism, unspecified: Secondary | ICD-10-CM

## 2015-08-16 DIAGNOSIS — G629 Polyneuropathy, unspecified: Secondary | ICD-10-CM

## 2015-08-16 DIAGNOSIS — M25562 Pain in left knee: Secondary | ICD-10-CM

## 2015-08-16 DIAGNOSIS — I10 Essential (primary) hypertension: Secondary | ICD-10-CM

## 2015-08-16 DIAGNOSIS — M25462 Effusion, left knee: Secondary | ICD-10-CM

## 2015-08-16 DIAGNOSIS — F17200 Nicotine dependence, unspecified, uncomplicated: Secondary | ICD-10-CM

## 2015-08-16 LAB — POCT URINALYSIS DIP (DEVICE)
Bilirubin Urine: NEGATIVE
GLUCOSE, UA: NEGATIVE mg/dL
Hgb urine dipstick: NEGATIVE
Ketones, ur: NEGATIVE mg/dL
Leukocytes, UA: NEGATIVE
Nitrite: NEGATIVE
PROTEIN: NEGATIVE mg/dL
SPECIFIC GRAVITY, URINE: 1.025 (ref 1.005–1.030)
UROBILINOGEN UA: 0.2 mg/dL (ref 0.0–1.0)
pH: 5.5 (ref 5.0–8.0)

## 2015-08-16 LAB — COMPLETE METABOLIC PANEL WITH GFR
ALBUMIN: 3.8 g/dL (ref 3.6–5.1)
ALT: 37 U/L — AB (ref 6–29)
AST: 32 U/L (ref 10–35)
Alkaline Phosphatase: 56 U/L (ref 33–130)
BUN: 17 mg/dL (ref 7–25)
CALCIUM: 9.8 mg/dL (ref 8.6–10.4)
CHLORIDE: 102 mmol/L (ref 98–110)
CO2: 25 mmol/L (ref 20–31)
CREATININE: 0.97 mg/dL (ref 0.50–1.05)
GFR, Est African American: 79 mL/min (ref >=60)
GFR, Est Non African American: 68 mL/min (ref >=60)
Glucose, Bld: 173 mg/dL — ABNORMAL HIGH (ref 65–99)
Potassium: 3.9 mmol/L (ref 3.5–5.3)
Sodium: 136 mmol/L (ref 135–146)
TOTAL PROTEIN: 9.2 g/dL — AB (ref 6.1–8.1)
Total Bilirubin: 0.3 mg/dL (ref 0.2–1.2)

## 2015-08-16 LAB — C-REACTIVE PROTEIN

## 2015-08-16 LAB — TSH: TSH: 1.862 u[IU]/mL (ref 0.350–4.500)

## 2015-08-16 MED ORDER — HYDROCHLOROTHIAZIDE 25 MG PO TABS: 25.0000 mg | ORAL_TABLET | Freq: Every day | ORAL | Status: DC

## 2015-08-16 MED ORDER — PREDNISONE 10 MG PO TABS: 10.0000 mg | ORAL_TABLET | Freq: Every day | ORAL | Status: DC

## 2015-08-16 MED ORDER — GABAPENTIN 100 MG PO CAPS: 100.0000 mg | ORAL_CAPSULE | Freq: Two times a day (BID) | ORAL | Status: DC
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
## 2015-08-16 NOTE — Patient Instructions (Signed)

## 2015-08-17 ENCOUNTER — Encounter: Payer: Self-pay | Admitting: Family Medicine

## 2015-08-17 DIAGNOSIS — M179 Osteoarthritis of knee, unspecified: Secondary | ICD-10-CM | POA: Insufficient documentation

## 2015-08-17 DIAGNOSIS — M25562 Pain in left knee: Secondary | ICD-10-CM | POA: Insufficient documentation

## 2015-08-17 DIAGNOSIS — M1710 Unilateral primary osteoarthritis, unspecified knee: Secondary | ICD-10-CM | POA: Insufficient documentation

## 2015-08-17 MED ORDER — LEVOTHYROXINE SODIUM 25 MCG PO TABS: 25.0000 ug | ORAL_TABLET | Freq: Every day | ORAL | Status: DC

## 2015-09-22 ENCOUNTER — Ambulatory Visit (INDEPENDENT_AMBULATORY_CARE_PROVIDER_SITE_OTHER): Payer: Self-pay | Admitting: Internal Medicine

## 2015-09-22 ENCOUNTER — Other Ambulatory Visit: Payer: Self-pay | Admitting: *Deleted

## 2015-09-22 ENCOUNTER — Encounter: Payer: Self-pay | Admitting: Internal Medicine

## 2015-09-22 VITALS — BP 116/82 | HR 70 | Temp 97.6°F | Wt 195.0 lb

## 2015-09-22 DIAGNOSIS — B20 Human immunodeficiency virus [HIV] disease: Secondary | ICD-10-CM

## 2015-09-22 DIAGNOSIS — F1020 Alcohol dependence, uncomplicated: Secondary | ICD-10-CM

## 2015-09-22 MED ORDER — DARUNAVIR ETHANOLATE 800 MG PO TABS: 800.0000 mg | ORAL_TABLET | Freq: Every day | ORAL | Status: DC

## 2015-09-22 MED ORDER — RITONAVIR 100 MG PO TABS: 100.0000 mg | ORAL_TABLET | Freq: Every day | ORAL | Status: DC

## 2015-09-22 MED ORDER — EMTRICITABINE-TENOFOVIR AF 200-25 MG PO TABS: 1.0000 | ORAL_TABLET | Freq: Every day | ORAL | Status: DC

## 2015-09-22 NOTE — Assessment & Plan Note (Addendum)
Now almost 1 year sober!

## 2015-09-22 NOTE — Progress Notes (Signed)
  Subjective:    Patient ID: Cheryl Thompson, female    DOB: 1966-06-11, 50 y.o.   MRN: [id number]  HPI Comes in here for followup of her HIV.    After a long history of poor compliance related to drug and alcohol abuse she had restarted Prezista, Norvir and Truvada last year.  She came in September 2015 on medication and was undetectable.  She relapsed again with substance abuse but has again been drug and alcohol free now for about 1 year. No weight loss, no diarrhea.        Review of Systems  Constitutional: Negative for fatigue.  HENT: Negative for trouble swallowing.   Gastrointestinal: Negative for nausea and diarrhea.  Musculoskeletal: Positive for arthralgias.  Skin: Negative for rash.  Neurological: Negative for dizziness and headaches.  Hematological: Negative for adenopathy.       Objective:   Physical Exam  Constitutional: She appears well-developed and well-nourished. No distress.  HENT:  Mouth/Throat: No oropharyngeal exudate.  Eyes: Right eye exhibits no discharge. Left eye exhibits no discharge. No scleral icterus.  Cardiovascular: Normal rate, regular rhythm and normal heart sounds.   No murmur heard. Lymphadenopathy:    She has no cervical adenopathy.  Skin: Skin is warm and dry. No erythema.          Assessment & Plan:

## 2015-09-22 NOTE — Assessment & Plan Note (Signed)
Doing great.  Labs today and rtc 4 months unless concerns.

## 2015-09-23 LAB — T-HELPER CELL (CD4) - (RCID CLINIC ONLY)
CD4 % Helper T Cell: 41 % (ref 33–55)
CD4 T Cell Abs: 1110 /uL (ref 400–2700)

## 2015-09-25 LAB — HIV-1 RNA QUANT-NO REFLEX-BLD
HIV 1 RNA QUANT: 37 {copies}/mL — AB (ref <20)
HIV-1 RNA Quant, Log: 1.57 Log copies/mL — ABNORMAL HIGH (ref <1.30)

## 2015-09-30 ENCOUNTER — Telehealth: Payer: Self-pay

## 2015-09-30 NOTE — Telephone Encounter (Signed)
Called and left message advising patient that she would need to be seen for rx for allergies. Also advised patient that she could try something otc like zyrtec or Claritin and that she could talk with her pharmacists regarding this to make sure it will not interfere with any current medications. Thanks!

## 2015-09-30 NOTE — Telephone Encounter (Signed)
PT called stating that her allergies are bad today and she is wondering if a prescription can be called in for her. She would like for the nurse to call her back with advice about what she can take.

## 2015-10-04 ENCOUNTER — Telehealth: Payer: Self-pay

## 2015-10-04 NOTE — Telephone Encounter (Signed)
Please call and schedule an appointment. Thanks!  

## 2015-10-04 NOTE — Telephone Encounter (Signed)
Patient will need to come in for evaluation, please call and schedule an appointment. Thanks!

## 2015-10-04 NOTE — Telephone Encounter (Signed)
PT CALLED AND NEEDS A REFERRAL TO ORTHOPEDIC DR. PATIENT IS REQUESTING A RETURN CALL.

## 2015-11-04 ENCOUNTER — Ambulatory Visit (INDEPENDENT_AMBULATORY_CARE_PROVIDER_SITE_OTHER): Payer: Self-pay | Admitting: Family Medicine

## 2015-11-04 ENCOUNTER — Encounter: Payer: Self-pay | Admitting: Family Medicine

## 2015-11-04 VITALS — BP 142/99 | HR 66 | Temp 97.5°F | Ht 69.0 in | Wt 202.0 lb

## 2015-11-04 DIAGNOSIS — Z9109 Other allergy status, other than to drugs and biological substances: Secondary | ICD-10-CM

## 2015-11-04 DIAGNOSIS — M25562 Pain in left knee: Secondary | ICD-10-CM

## 2015-11-04 DIAGNOSIS — E039 Hypothyroidism, unspecified: Secondary | ICD-10-CM

## 2015-11-04 DIAGNOSIS — Z91048 Other nonmedicinal substance allergy status: Secondary | ICD-10-CM

## 2015-11-04 DIAGNOSIS — M25462 Effusion, left knee: Secondary | ICD-10-CM

## 2015-11-04 DIAGNOSIS — I10 Essential (primary) hypertension: Secondary | ICD-10-CM

## 2015-11-04 DIAGNOSIS — H65193 Other acute nonsuppurative otitis media, bilateral: Secondary | ICD-10-CM

## 2015-11-04 LAB — COMPLETE METABOLIC PANEL WITH GFR
ALK PHOS: 59 U/L (ref 33–130)
ALT: 72 U/L — ABNORMAL HIGH (ref 6–29)
AST: 104 U/L — AB (ref 10–35)
Albumin: 3.8 g/dL (ref 3.6–5.1)
BUN: 9 mg/dL (ref 7–25)
CHLORIDE: 106 mmol/L (ref 98–110)
CO2: 26 mmol/L (ref 20–31)
Calcium: 9.1 mg/dL (ref 8.6–10.4)
Creat: 0.82 mg/dL (ref 0.50–1.05)
GFR, EST NON AFRICAN AMERICAN: 84 mL/min (ref >=60)
GFR, Est African American: >89 mL/min (ref >=60)
GLUCOSE: 80 mg/dL (ref 65–99)
POTASSIUM: 4.1 mmol/L (ref 3.5–5.3)
SODIUM: 141 mmol/L (ref 135–146)
Total Bilirubin: 0.5 mg/dL (ref 0.2–1.2)
Total Protein: 7.5 g/dL (ref 6.1–8.1)

## 2015-11-04 LAB — CBC WITH DIFFERENTIAL/PLATELET
BASOS ABS: 0 {cells}/uL (ref 0–200)
Basophils Relative: 0 %
Eosinophils Absolute: 198 cells/uL (ref 15–500)
Eosinophils Relative: 3 %
HEMATOCRIT: 37.1 % (ref 35.0–45.0)
HEMOGLOBIN: 12.5 g/dL (ref 11.7–15.5)
LYMPHS ABS: 2640 {cells}/uL (ref 850–3900)
LYMPHS PCT: 40 %
MCH: 32.5 pg (ref 27.0–33.0)
MCHC: 33.7 g/dL (ref 32.0–36.0)
MCV: 96.4 fL (ref 80.0–100.0)
MONO ABS: 660 {cells}/uL (ref 200–950)
MPV: 12.8 fL — ABNORMAL HIGH (ref 7.5–12.5)
Monocytes Relative: 10 %
NEUTROS PCT: 47 %
Neutro Abs: 3102 cells/uL (ref 1500–7800)
PLATELETS: 117 10*3/uL — AB (ref 140–400)
RBC: 3.85 MIL/uL (ref 3.80–5.10)
RDW: 14.2 % (ref 11.0–15.0)
WBC: 6.6 10*3/uL (ref 3.8–10.8)

## 2015-11-04 LAB — TSH: TSH: 6.49 m[IU]/L — AB

## 2015-11-04 MED ORDER — PREDNISONE 10 MG PO TABS: 10.0000 mg | ORAL_TABLET | Freq: Every day | ORAL | Status: DC

## 2015-11-04 MED ORDER — AZITHROMYCIN 250 MG PO TABS: ORAL_TABLET | ORAL | Status: DC

## 2015-11-04 MED ORDER — LEVOCETIRIZINE DIHYDROCHLORIDE 5 MG PO TABS
5.0000 mg | ORAL_TABLET | Freq: Every evening | ORAL | Status: DC
Start: 2015-11-04 — End: 2021-06-06

## 2015-11-04 MED ORDER — ACETAMINOPHEN-CODEINE #3 300-30 MG PO TABS: 1.0000 | ORAL_TABLET | Freq: Four times a day (QID) | ORAL | Status: DC | PRN

## 2015-11-04 NOTE — Patient Instructions (Addendum)
Will start Prednisone tapered dose for left knee edema.  Day 1; 60 mg Day 2; 50 mg Day 3; 40 mg Day 4; 30 mg Day 5; 20 mg Day 6; 10 mg  Start Tylenol # 3 every 6 hours as needed for moderate to severe left knee pain  Will start Azithromycin 500 mg ; 250 on days 2-5.  Will also start Xyzal 5 mg before bed for a continuation of environmental allergies.    Peripheral Edema You have swelling in your legs (peripheral edema). This swelling is due to excess accumulation of salt and water in your body. Edema may be a sign of heart, kidney or liver disease, or a side effect of a medication. It may also be due to problems in the leg veins. Elevating your legs and using special support stockings may be very helpful, if the cause of the swelling is due to poor venous circulation. Avoid long periods of standing, whatever the cause. Treatment of edema depends on identifying the cause. Chips, pretzels, pickles and other salty foods should be avoided. Restricting salt in your diet is almost always needed. Water pills (diuretics) are often used to remove the excess salt and water from your body via urine. These medicines prevent the kidney from reabsorbing sodium. This increases urine flow. Diuretic treatment may also result in lowering of potassium levels in your body. Potassium supplements may be needed if you have to use diuretics daily. Daily weights can help you keep track of your progress in clearing your edema. You should call your caregiver for follow up care as recommended. SEEK IMMEDIATE MEDICAL CARE IF:   You have increased swelling, pain, redness, or heat in your legs.  You develop shortness of breath, especially when lying down.  You develop chest or abdominal pain, weakness, or fainting.  You have a fever.   This information is not intended to replace advice given to you by your health care provider. Make sure you discuss any questions you have with your health care provider.   Document  Released: 08/09/2004 Document Revised: 09/24/2011 Document Reviewed: 01/12/2015 Elsevier Interactive Patient Education 2016 [employer]. Hypertension Hypertension, commonly called high blood pressure, is when the force of blood pumping through your arteries is too strong. Your arteries are the blood vessels that carry blood from your heart throughout your body. A blood pressure reading consists of a higher number over a lower number, such as 110/72. The higher number (systolic) is the pressure inside your arteries when your heart pumps. The lower number (diastolic) is the pressure inside your arteries when your heart relaxes. Ideally you want your blood pressure below 120/80. Hypertension forces your heart to work harder to pump blood. Your arteries may become narrow or stiff. Having untreated or uncontrolled hypertension can cause heart attack, stroke, kidney disease, and other problems. RISK FACTORS Some risk factors for high blood pressure are controllable. Others are not.  Risk factors you cannot control include:   Race. You may be at higher risk if you are African American.  Age. Risk increases with age.  Gender. Men are at higher risk than women before age 81 years. After age 52, women are at higher risk than men. Risk factors you can control include:  Not getting enough exercise or physical activity.  Being overweight.  Getting too much fat, sugar, calories, or salt in your diet.  Drinking too much alcohol. SIGNS AND SYMPTOMS Hypertension does not usually cause signs or symptoms. Extremely high blood pressure (hypertensive crisis) may  cause headache, anxiety, shortness of breath, and nosebleed. DIAGNOSIS To check if you have hypertension, your health care provider will measure your blood pressure while you are seated, with your arm held at the level of your heart. It should be measured at least twice using the same arm. Certain conditions can cause a difference in blood pressure  between your right and left arms. A blood pressure reading that is higher than normal on one occasion does not mean that you need treatment. If it is not clear whether you have high blood pressure, you may be asked to return on a different day to have your blood pressure checked again. Or, you may be asked to monitor your blood pressure at home for 1 or more weeks. TREATMENT Treating high blood pressure includes making lifestyle changes and possibly taking medicine. Living a healthy lifestyle can help lower high blood pressure. You may need to change some of your habits. Lifestyle changes may include:  Following the DASH diet. This diet is high in fruits, vegetables, and whole grains. It is low in salt, red meat, and added sugars.  Keep your sodium intake below 2,300 mg per day.  Getting at least 30-45 minutes of aerobic exercise at least 4 times per week.  Losing weight if necessary.  Not smoking.  Limiting alcoholic beverages.  Learning ways to reduce stress. Your health care provider may prescribe medicine if lifestyle changes are not enough to get your blood pressure under control, and if one of the following is true:  You are 39-61 years of age and your systolic blood pressure is above 140.  You are 85 years of age or older, and your systolic blood pressure is above 150.  Your diastolic blood pressure is above 90.  You have diabetes, and your systolic blood pressure is over [id number] or your diastolic blood pressure is over 90.  You have kidney disease and your blood pressure is above 140/90.  You have heart disease and your blood pressure is above 140/90. Your personal target blood pressure may vary depending on your medical conditions, your age, and other factors. HOME CARE INSTRUCTIONS  Have your blood pressure rechecked as directed by your health care provider.   Take medicines only as directed by your health care provider. Follow the directions carefully. Blood pressure  medicines must be taken as prescribed. The medicine does not work as well when you skip doses. Skipping doses also puts you at risk for problems.  Do not smoke.   Monitor your blood pressure at home as directed by your health care provider. SEEK MEDICAL CARE IF:   You think you are having a reaction to medicines taken.  You have recurrent headaches or feel dizzy.  You have swelling in your ankles.  You have trouble with your vision. SEEK IMMEDIATE MEDICAL CARE IF:  You develop a severe headache or confusion.  You have unusual weakness, numbness, or feel faint.  You have severe chest or abdominal pain.  You vomit repeatedly.  You have trouble breathing. MAKE SURE YOU:   Understand these instructions.  Will watch your condition.  Will get help right away if you are not doing well or get worse.   This information is not intended to replace advice given to you by your health care provider. Make sure you discuss any questions you have with your health care provider.   Document Released: 07/02/2005 Document Revised: 11/16/2014 Document Reviewed: 04/24/2013 Elsevier Interactive Patient Education [employer].
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
## 2015-11-05 LAB — URINALYSIS, COMPLETE
BACTERIA UA: NONE SEEN [HPF]
BILIRUBIN URINE: NEGATIVE
CRYSTALS: NONE SEEN [HPF]
Casts: NONE SEEN [LPF]
GLUCOSE, UA: NEGATIVE
Hgb urine dipstick: NEGATIVE
KETONES UR: NEGATIVE
LEUKOCYTES UA: NEGATIVE
Nitrite: NEGATIVE
PROTEIN: NEGATIVE
RBC / HPF: NONE SEEN RBC/HPF (ref <=2)
SPECIFIC GRAVITY, URINE: 1.004 (ref 1.001–1.035)
WBC UA: NONE SEEN WBC/HPF (ref <=5)
Yeast: NONE SEEN [HPF]
pH: 6.5 (ref 5.0–8.0)

## 2015-11-09 ENCOUNTER — Telehealth: Payer: Self-pay

## 2015-11-09 NOTE — Telephone Encounter (Signed)
Called and left message advising patient of labs and to continue synthroid as prescribed. Thanks!

## 2015-11-09 NOTE — Telephone Encounter (Signed)
-----   Message from [name], [place] sent at 11/09/2015  8:18 AM EDT ----- Regarding: lab results  Please inform patient that we will continue Synthroid at 100 mcg, TSH remains elevated.  Please follow up in office as scheduled.   Thanks ----- Message -----    From: Lab in [facility] Interface    Sent: 11/05/2015   4:33 AM      To: [name], FNP

## 2015-11-23 DIAGNOSIS — D696 Thrombocytopenia, unspecified: Secondary | ICD-10-CM | POA: Insufficient documentation

## 2015-11-28 ENCOUNTER — Other Ambulatory Visit: Payer: Self-pay | Admitting: Internal Medicine

## 2015-11-28 DIAGNOSIS — B20 Human immunodeficiency virus [HIV] disease: Secondary | ICD-10-CM

## 2015-11-29 ENCOUNTER — Telehealth: Payer: Self-pay | Admitting: *Deleted

## 2015-11-29 NOTE — Telephone Encounter (Signed)
Patient asking for refills of Descovy/Norvir/Prezista, matches meds being refilled.  Last office note has her taking Truvada/Norvir/Prezista.  Please advise. Landis Gandy, RN

## 2015-11-29 NOTE — Telephone Encounter (Signed)
It should be Descovy.  A note from last August I was going to change. thanks

## 2015-11-29 NOTE — Telephone Encounter (Signed)
Thanks

## 2015-11-30 ENCOUNTER — Encounter: Payer: Self-pay | Admitting: Family Medicine

## 2015-11-30 ENCOUNTER — Ambulatory Visit (INDEPENDENT_AMBULATORY_CARE_PROVIDER_SITE_OTHER): Payer: Self-pay | Admitting: Family Medicine

## 2015-11-30 ENCOUNTER — Other Ambulatory Visit: Payer: Self-pay | Admitting: *Deleted

## 2015-11-30 VITALS — BP 117/85 | HR 86 | Ht 69.0 in | Wt 202.0 lb

## 2015-11-30 DIAGNOSIS — M25562 Pain in left knee: Secondary | ICD-10-CM

## 2015-11-30 MED ORDER — METHYLPREDNISOLONE ACETATE 40 MG/ML IJ SUSP
40.0000 mg | Freq: Once | INTRAMUSCULAR | Status: AC
  Administered 2015-11-30: 40 mg via INTRA_ARTICULAR

## 2015-11-30 NOTE — Patient Instructions (Signed)
You were given a cortisone injection today for a gout flare. Call us in a week to let us know how you're doing. If this isn't helping you enough I would then go ahead with an MRI to assess for a meniscus tear in addition to your history of gout because the injection should help gout tremendously.
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
## 2015-12-01 NOTE — Assessment & Plan Note (Signed)
Prior radiographs with mild degenerative changes.  Had knee aspirated 2 months ago but no injection given.  Advised we go ahead with ultrasound guided cortisone injection today.  Call us in a week for an update on her status.  If not improving would consider MRI to assess for concurrent meniscus tear.  Icing, compression, elevation, nsaids as needed.  After informed written consent, patient was lying supine on exam table. Left knee was prepped with alcohol swab and utilizing superolateral approach with ultrasound guidance, patient's left knee was injected intraarticularly with 3:1 marcaine: depomedrol. Patient tolerated the procedure well without immediate complications.

## 2015-12-13 ENCOUNTER — Other Ambulatory Visit: Payer: Self-pay | Admitting: *Deleted

## 2015-12-13 ENCOUNTER — Other Ambulatory Visit (INDEPENDENT_AMBULATORY_CARE_PROVIDER_SITE_OTHER): Payer: Self-pay

## 2015-12-13 ENCOUNTER — Encounter: Payer: Self-pay | Admitting: Family Medicine

## 2015-12-13 ENCOUNTER — Ambulatory Visit (INDEPENDENT_AMBULATORY_CARE_PROVIDER_SITE_OTHER): Payer: Self-pay | Admitting: Family Medicine

## 2015-12-13 ENCOUNTER — Ambulatory Visit (INDEPENDENT_AMBULATORY_CARE_PROVIDER_SITE_OTHER): Payer: Self-pay | Admitting: Pharmacist Clinician (PhC)/ Clinical Pharmacy Specialist

## 2015-12-13 VITALS — BP 142/90 | HR 82 | Temp 98.2°F | Resp 16 | Ht 69.0 in | Wt 210.0 lb

## 2015-12-13 DIAGNOSIS — B20 Human immunodeficiency virus [HIV] disease: Secondary | ICD-10-CM

## 2015-12-13 DIAGNOSIS — Z113 Encounter for screening for infections with a predominantly sexual mode of transmission: Secondary | ICD-10-CM

## 2015-12-13 DIAGNOSIS — I10 Essential (primary) hypertension: Secondary | ICD-10-CM

## 2015-12-13 DIAGNOSIS — R635 Abnormal weight gain: Secondary | ICD-10-CM

## 2015-12-13 DIAGNOSIS — Z79899 Other long term (current) drug therapy: Secondary | ICD-10-CM

## 2015-12-13 DIAGNOSIS — G40909 Epilepsy, unspecified, not intractable, without status epilepticus: Secondary | ICD-10-CM

## 2015-12-13 DIAGNOSIS — F17200 Nicotine dependence, unspecified, uncomplicated: Secondary | ICD-10-CM

## 2015-12-13 DIAGNOSIS — E039 Hypothyroidism, unspecified: Secondary | ICD-10-CM

## 2015-12-13 DIAGNOSIS — G629 Polyneuropathy, unspecified: Secondary | ICD-10-CM

## 2015-12-13 LAB — CBC WITH DIFFERENTIAL/PLATELET
BASOS PCT: 0 %
Basophils Absolute: 0 cells/uL (ref 0–200)
EOS ABS: 228 {cells}/uL (ref 15–500)
EOS PCT: 3 %
HEMATOCRIT: 40.2 % (ref 35.0–45.0)
Hemoglobin: 13.3 g/dL (ref 11.7–15.5)
LYMPHS PCT: 40 %
Lymphs Abs: 3040 cells/uL (ref 850–3900)
MCH: 31.5 pg (ref 27.0–33.0)
MCHC: 33.1 g/dL (ref 32.0–36.0)
MCV: 95.3 fL (ref 80.0–100.0)
MONOS PCT: 10 %
MPV: 13.4 fL — AB (ref 7.5–12.5)
Monocytes Absolute: 760 cells/uL (ref 200–950)
Neutro Abs: 3572 cells/uL (ref 1500–7800)
Neutrophils Relative %: 47 %
Platelets: 119 10*3/uL — ABNORMAL LOW (ref 140–400)
RBC: 4.22 MIL/uL (ref 3.80–5.10)
RDW: 13.8 % (ref 11.0–15.0)
WBC: 7.6 10*3/uL (ref 3.8–10.8)

## 2015-12-13 LAB — POCT URINALYSIS DIP (DEVICE)
BILIRUBIN URINE: NEGATIVE
Glucose, UA: NEGATIVE mg/dL
Hgb urine dipstick: NEGATIVE
Ketones, ur: NEGATIVE mg/dL
NITRITE: NEGATIVE
PH: 5.5 (ref 5.0–8.0)
Protein, ur: NEGATIVE mg/dL
Specific Gravity, Urine: <=1.005 (ref 1.005–1.030)
Urobilinogen, UA: 0.2 mg/dL (ref 0.0–1.0)

## 2015-12-13 LAB — GLUCOSE, CAPILLARY: GLUCOSE-CAPILLARY: 100 mg/dL — AB (ref 65–99)

## 2015-12-13 LAB — LIPID PANEL
Cholesterol: 180 mg/dL (ref 125–200)
HDL: 32 mg/dL — AB (ref >=46)
LDL Cholesterol: 76 mg/dL (ref <130)
TRIGLYCERIDES: 359 mg/dL — AB (ref <150)
Total CHOL/HDL Ratio: 5.6 Ratio — ABNORMAL HIGH (ref <=5.0)
VLDL: 72 mg/dL — ABNORMAL HIGH (ref <30)

## 2015-12-13 LAB — HEMOGLOBIN A1C
HEMOGLOBIN A1C: 6.3 % — AB (ref <5.7)
MEAN PLASMA GLUCOSE: 134 mg/dL

## 2015-12-13 MED ORDER — DARUNAVIR-COBICISTAT 800-150 MG PO TABS: 1.0000 | ORAL_TABLET | Freq: Every day | ORAL | Status: DC

## 2015-12-13 MED ORDER — LEVOTHYROXINE SODIUM 100 MCG PO TABS: 100.0000 ug | ORAL_TABLET | Freq: Every day | ORAL | Status: DC

## 2015-12-13 MED ORDER — DULOXETINE HCL 60 MG PO CPEP: 60.0000 mg | ORAL_CAPSULE | Freq: Every day | ORAL | Status: DC

## 2015-12-13 MED ORDER — GABAPENTIN 300 MG PO CAPS: 300.0000 mg | ORAL_CAPSULE | Freq: Three times a day (TID) | ORAL | Status: DC
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
## 2015-12-13 NOTE — Patient Instructions (Signed)
Stop Prezista and Norvir Start Prezcobix 1 tablet daily Start Cymbalta 1 capsule daily
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
## 2015-12-14 ENCOUNTER — Other Ambulatory Visit: Payer: Self-pay | Admitting: Family Medicine

## 2015-12-14 DIAGNOSIS — R7303 Prediabetes: Secondary | ICD-10-CM

## 2015-12-14 LAB — RPR

## 2015-12-14 LAB — HIV-1 RNA QUANT-NO REFLEX-BLD: HIV 1 RNA Quant: <20 copies/mL (ref <20)

## 2015-12-14 LAB — T-HELPER CELL (CD4) - (RCID CLINIC ONLY)
CD4 T CELL ABS: 1470 /uL (ref 400–2700)
CD4 T CELL HELPER: 41 % (ref 33–55)

## 2015-12-14 MED ORDER — METFORMIN HCL 500 MG PO TABS: 500.0000 mg | ORAL_TABLET | Freq: Every day | ORAL | Status: DC

## 2015-12-14 NOTE — Progress Notes (Signed)
Tried to call, no answer and voicemail was full. Will try later. Thanks!

## 2015-12-15 ENCOUNTER — Encounter: Payer: Self-pay | Admitting: Internal Medicine

## 2015-12-15 NOTE — Progress Notes (Signed)
Called no answer.  NO room on voicemail to leave message. Thanks!

## 2015-12-16 NOTE — Progress Notes (Signed)
Called, no answer. No room on voicemail to leave message will try later. Thanks!

## 2015-12-19 NOTE — Progress Notes (Signed)
Patient returned call and I advised of labs and to start Metformin 500mg  daily. Advised to start lowfat, low carb diet over 5 to 6 meals daily, drinking plenty of water 6 to 8 glasses daily, and getting exercise 150 minutes weekly. Thanks!~

## 2015-12-26 ENCOUNTER — Other Ambulatory Visit: Payer: Self-pay | Admitting: Internal Medicine

## 2015-12-26 ENCOUNTER — Telehealth: Payer: Self-pay | Admitting: Family Medicine

## 2015-12-26 DIAGNOSIS — B20 Human immunodeficiency virus [HIV] disease: Secondary | ICD-10-CM

## 2015-12-26 NOTE — Telephone Encounter (Signed)
We had discussed left knee only and gave her an injection.  Our discussion was to go ahead with MRI if this didn't help her.  If her right knee and right hip are bothering her we should probably see her back for evaluation though.

## 2015-12-26 NOTE — Telephone Encounter (Signed)
Unable to contact patient.    Will try again.

## 2015-12-28 ENCOUNTER — Other Ambulatory Visit: Payer: Self-pay | Admitting: Family Medicine

## 2016-01-13 ENCOUNTER — Ambulatory Visit: Payer: Self-pay | Admitting: Family Medicine

## 2016-01-16 ENCOUNTER — Encounter: Payer: Self-pay | Admitting: Internal Medicine

## 2016-01-16 ENCOUNTER — Ambulatory Visit (INDEPENDENT_AMBULATORY_CARE_PROVIDER_SITE_OTHER): Payer: Self-pay | Admitting: Internal Medicine

## 2016-01-16 VITALS — BP 106/73 | HR 80 | Temp 98.9°F | Ht 69.5 in | Wt 202.0 lb

## 2016-01-16 DIAGNOSIS — B20 Human immunodeficiency virus [HIV] disease: Secondary | ICD-10-CM

## 2016-01-16 DIAGNOSIS — G629 Polyneuropathy, unspecified: Secondary | ICD-10-CM

## 2016-01-16 DIAGNOSIS — F1020 Alcohol dependence, uncomplicated: Secondary | ICD-10-CM

## 2016-01-16 DIAGNOSIS — B182 Chronic viral hepatitis C: Secondary | ICD-10-CM
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
## 2016-01-16 NOTE — Assessment & Plan Note (Signed)
Improved on medication °

## 2016-01-16 NOTE — Assessment & Plan Note (Signed)
Doing great.  rtc 6 months.  

## 2016-01-16 NOTE — Assessment & Plan Note (Signed)
Will check labs next visit and consider treatment then via [place]. Also will get elastography once she gets [facility] card again.

## 2016-01-16 NOTE — Assessment & Plan Note (Signed)
Continues to do well.  Now 1 year.

## 2016-01-20 ENCOUNTER — Other Ambulatory Visit: Payer: Self-pay | Admitting: Internal Medicine

## 2016-01-20 ENCOUNTER — Other Ambulatory Visit: Payer: Self-pay | Admitting: Family Medicine

## 2016-01-20 DIAGNOSIS — B20 Human immunodeficiency virus [HIV] disease: Secondary | ICD-10-CM

## 2016-01-30 ENCOUNTER — Ambulatory Visit: Payer: Self-pay | Attending: Internal Medicine

## 2016-02-03 ENCOUNTER — Ambulatory Visit: Payer: Self-pay | Admitting: Family Medicine

## 2016-02-17 ENCOUNTER — Other Ambulatory Visit: Payer: Self-pay | Admitting: Family Medicine

## 2016-03-16 ENCOUNTER — Ambulatory Visit (INDEPENDENT_AMBULATORY_CARE_PROVIDER_SITE_OTHER): Payer: Self-pay | Admitting: Family Medicine

## 2016-03-16 ENCOUNTER — Encounter: Payer: Self-pay | Admitting: Family Medicine

## 2016-03-16 DIAGNOSIS — R7303 Prediabetes: Secondary | ICD-10-CM

## 2016-03-16 DIAGNOSIS — I10 Essential (primary) hypertension: Secondary | ICD-10-CM

## 2016-03-16 DIAGNOSIS — E039 Hypothyroidism, unspecified: Secondary | ICD-10-CM

## 2016-03-16 DIAGNOSIS — G629 Polyneuropathy, unspecified: Secondary | ICD-10-CM

## 2016-03-16 MED ORDER — METFORMIN HCL 500 MG PO TABS: 500.0000 mg | ORAL_TABLET | Freq: Every day | ORAL | 2 refills | Status: DC

## 2016-03-16 MED ORDER — GABAPENTIN 300 MG PO CAPS: 300.0000 mg | ORAL_CAPSULE | Freq: Three times a day (TID) | ORAL | 1 refills | Status: DC

## 2016-03-16 MED ORDER — LEVOTHYROXINE SODIUM 100 MCG PO TABS: 100.0000 ug | ORAL_TABLET | Freq: Every day | ORAL | 2 refills | Status: DC

## 2016-03-16 MED ORDER — HYDROCHLOROTHIAZIDE 25 MG PO TABS: 25.0000 mg | ORAL_TABLET | Freq: Every day | ORAL | 5 refills | Status: DC

## 2016-03-16 NOTE — Patient Instructions (Signed)
Use over the counter cortisone cream for rash Use mineral drops a couple of times a week for dry itchy ears.

## 2016-03-16 NOTE — Progress Notes (Signed)
Patient is here for 3 month FU  Patient complains of lower back pain radiating to her legs.  Patient complains of bilateral ear pain. Pain is scaled at an 8. Pain is described as throbbing, burning and itching.  Patient complains of skin itching after beginning metformin.  Patient has not taken medication today.
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
## 2016-03-29 ENCOUNTER — Telehealth: Payer: Self-pay

## 2016-03-29 NOTE — Telephone Encounter (Signed)
Message left on voice mail  9:30 am Patient is calling with complaint of not feeling better with respiratory infection since visit to [facility].   Return call to patient @ 2:47 pm and she is now on the way to the urgent care for examination. Advised this was best since she was still symptomatic.  I suggested she f/u with primary care physician.   Laverle Patter, RN .

## 2016-04-07 ENCOUNTER — Other Ambulatory Visit: Payer: Self-pay | Admitting: Family Medicine

## 2016-04-20 ENCOUNTER — Encounter: Payer: Self-pay | Admitting: Family Medicine

## 2016-04-20 ENCOUNTER — Ambulatory Visit (INDEPENDENT_AMBULATORY_CARE_PROVIDER_SITE_OTHER): Payer: Self-pay | Admitting: Family Medicine

## 2016-04-20 VITALS — BP 130/78 | HR 64 | Temp 98.2°F | Resp 18 | Ht 69.5 in | Wt 198.0 lb

## 2016-04-20 DIAGNOSIS — Z1239 Encounter for other screening for malignant neoplasm of breast: Secondary | ICD-10-CM

## 2016-04-20 DIAGNOSIS — E039 Hypothyroidism, unspecified: Secondary | ICD-10-CM

## 2016-04-20 DIAGNOSIS — R7303 Prediabetes: Secondary | ICD-10-CM

## 2016-04-20 DIAGNOSIS — Z1231 Encounter for screening mammogram for malignant neoplasm of breast: Secondary | ICD-10-CM

## 2016-04-20 LAB — LIPID PANEL
CHOL/HDL RATIO: 8.5 ratio — AB (ref <=5.0)
Cholesterol: 196 mg/dL (ref 125–200)
HDL: 23 mg/dL — ABNORMAL LOW (ref >=46)
LDL CALC: 103 mg/dL (ref <130)
Triglycerides: 348 mg/dL — ABNORMAL HIGH (ref <150)
VLDL: 70 mg/dL — AB (ref <30)

## 2016-04-20 LAB — COMPLETE METABOLIC PANEL WITH GFR
ALT: 23 U/L (ref 6–29)
AST: 38 U/L — ABNORMAL HIGH (ref 10–35)
Albumin: 3.5 g/dL — ABNORMAL LOW (ref 3.6–5.1)
Alkaline Phosphatase: 55 U/L (ref 33–130)
BUN: 12 mg/dL (ref 7–25)
CO2: 29 mmol/L (ref 20–31)
Calcium: 8.9 mg/dL (ref 8.6–10.4)
Chloride: 107 mmol/L (ref 98–110)
Creat: 0.94 mg/dL (ref 0.50–1.05)
GFR, Est African American: 81 mL/min (ref >=60)
GFR, Est Non African American: 70 mL/min (ref >=60)
Glucose, Bld: 93 mg/dL (ref 65–99)
Potassium: 3.8 mmol/L (ref 3.5–5.3)
Sodium: 141 mmol/L (ref 135–146)
Total Bilirubin: 0.3 mg/dL (ref 0.2–1.2)
Total Protein: 7.1 g/dL (ref 6.1–8.1)

## 2016-04-20 LAB — POCT GLYCOSYLATED HEMOGLOBIN (HGB A1C): Hemoglobin A1C: 5.8

## 2016-04-20 LAB — CBC WITH DIFFERENTIAL/PLATELET
BASOS PCT: 0 %
Basophils Absolute: 0 cells/uL (ref 0–200)
EOS PCT: 5 %
Eosinophils Absolute: 305 cells/uL (ref 15–500)
HCT: 35.9 % (ref 35.0–45.0)
HEMOGLOBIN: 12 g/dL (ref 11.7–15.5)
LYMPHS ABS: 2928 {cells}/uL (ref 850–3900)
LYMPHS PCT: 48 %
MCH: 31.8 pg (ref 27.0–33.0)
MCHC: 33.4 g/dL (ref 32.0–36.0)
MCV: 95.2 fL (ref 80.0–100.0)
MPV: 11.9 fL (ref 7.5–12.5)
Monocytes Absolute: 610 cells/uL (ref 200–950)
Monocytes Relative: 10 %
NEUTROS PCT: 37 %
Neutro Abs: 2257 cells/uL (ref 1500–7800)
Platelets: 142 10*3/uL (ref 140–400)
RBC: 3.77 MIL/uL — AB (ref 3.80–5.10)
RDW: 14.2 % (ref 11.0–15.0)
WBC: 6.1 10*3/uL (ref 3.8–10.8)

## 2016-04-20 LAB — T4, FREE: Free T4: 0.8 ng/dL (ref 0.8–1.8)

## 2016-04-20 LAB — TSH: TSH: 3.48 m[IU]/L

## 2016-04-20 LAB — T3, FREE: T3, Free: 2.6 pg/mL (ref 2.3–4.2)

## 2016-04-20 NOTE — Progress Notes (Signed)
Patient is here for FU  Patient complains of lower back pain being present constantly. Pain is described as aching and scaled at a 6.  Patient has not taken medications today and patient has eaten today.
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
## 2016-04-20 NOTE — Patient Instructions (Signed)
Follow up in 3 months

## 2016-04-21 LAB — MICROALBUMIN, URINE: MICROALB UR: 0.5 mg/dL

## 2016-05-03 ENCOUNTER — Encounter: Payer: Self-pay | Admitting: Internal Medicine

## 2016-05-03 ENCOUNTER — Encounter: Payer: Self-pay | Admitting: Family Medicine

## 2016-05-03 ENCOUNTER — Ambulatory Visit (INDEPENDENT_AMBULATORY_CARE_PROVIDER_SITE_OTHER): Payer: Self-pay | Admitting: Internal Medicine

## 2016-05-03 ENCOUNTER — Ambulatory Visit (INDEPENDENT_AMBULATORY_CARE_PROVIDER_SITE_OTHER): Payer: Self-pay | Admitting: Family Medicine

## 2016-05-03 VITALS — BP 121/84 | HR 83 | Ht 70.0 in | Wt 198.0 lb

## 2016-05-03 VITALS — BP 116/82 | HR 77 | Temp 98.3°F | Wt 197.0 lb

## 2016-05-03 DIAGNOSIS — Z23 Encounter for immunization: Secondary | ICD-10-CM

## 2016-05-03 DIAGNOSIS — B20 Human immunodeficiency virus [HIV] disease: Secondary | ICD-10-CM

## 2016-05-03 DIAGNOSIS — B182 Chronic viral hepatitis C: Secondary | ICD-10-CM

## 2016-05-03 DIAGNOSIS — M25562 Pain in left knee: Secondary | ICD-10-CM

## 2016-05-03 DIAGNOSIS — Z113 Encounter for screening for infections with a predominantly sexual mode of transmission: Secondary | ICD-10-CM

## 2016-05-03 DIAGNOSIS — F1921 Other psychoactive substance dependence, in remission: Secondary | ICD-10-CM

## 2016-05-03 MED ORDER — METHYLPREDNISOLONE ACETATE 40 MG/ML IJ SUSP
40.0000 mg | Freq: Once | INTRAMUSCULAR | Status: AC
  Administered 2016-05-03: 40 mg via INTRA_ARTICULAR

## 2016-05-03 NOTE — Assessment & Plan Note (Signed)
Will provide letter today.
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
## 2016-05-03 NOTE — Assessment & Plan Note (Signed)
Will do labs today and rtc 6 months.

## 2016-05-03 NOTE — Assessment & Plan Note (Signed)
Will discuss treatment next visit.

## 2016-05-03 NOTE — Patient Instructions (Signed)
We went ahead with aspiration and injection of your knee today.

## 2016-05-04 LAB — T-HELPER CELL (CD4) - (RCID CLINIC ONLY)
CD4 T CELL ABS: 670 /uL (ref 400–2700)
CD4 T CELL HELPER: 36 % (ref 33–55)

## 2016-05-07 NOTE — Assessment & Plan Note (Signed)
Prior radiographs with mild degenerative changes.  History of gout as well - todays aspirate was cloudy consistent with gout flare.  Injection given as well.  Advised to call us in a week if not improving as expected.  Would wait a month to 6 weeks before checking uric acid, adding allopurinol.  Icing, compression, elevation, nsaids as needed.  After informed written consent patient was lying supine on exam table.  Left knee was prepped with alcohol swab.  Utilizing superolateral approach, 3 mL of marcaine was used for local anesthesia.  Then using an 18g needle on 60cc syringe with ultrasound guidance, 20 mL of cloudy straw-colored fluid was aspirated from left knee.  Knee was then injected with 3:1 marcaine:depomedrol.  Patient tolerated procedure well without immediate complications
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
## 2016-05-08 LAB — HIV-1 RNA QUANT-NO REFLEX-BLD

## 2016-05-21 ENCOUNTER — Other Ambulatory Visit: Payer: Self-pay | Admitting: *Deleted

## 2016-05-21 DIAGNOSIS — Z21 Asymptomatic human immunodeficiency virus [HIV] infection status: Secondary | ICD-10-CM

## 2016-05-21 DIAGNOSIS — B20 Human immunodeficiency virus [HIV] disease: Secondary | ICD-10-CM

## 2016-05-21 MED ORDER — DARUNAVIR-COBICISTAT 800-150 MG PO TABS: ORAL_TABLET | ORAL | 5 refills | Status: DC

## 2016-05-21 MED ORDER — EMTRICITABINE-TENOFOVIR AF 200-25 MG PO TABS: 1.0000 | ORAL_TABLET | Freq: Every day | ORAL | 5 refills | Status: DC

## 2016-05-22 ENCOUNTER — Encounter: Payer: Self-pay | Admitting: Family Medicine

## 2016-05-28 ENCOUNTER — Encounter: Payer: Self-pay | Admitting: Internal Medicine

## 2016-05-28 ENCOUNTER — Encounter: Payer: Self-pay | Admitting: Family Medicine

## 2016-06-04 ENCOUNTER — Telehealth: Payer: Self-pay | Admitting: Family Medicine

## 2016-06-04 ENCOUNTER — Other Ambulatory Visit: Payer: Self-pay | Admitting: Family Medicine

## 2016-06-04 ENCOUNTER — Other Ambulatory Visit: Payer: Self-pay | Admitting: Internal Medicine

## 2016-06-05 ENCOUNTER — Other Ambulatory Visit: Payer: Self-pay

## 2016-06-05 DIAGNOSIS — R7303 Prediabetes: Secondary | ICD-10-CM

## 2016-06-05 MED ORDER — METFORMIN HCL 500 MG PO TABS: 500.0000 mg | ORAL_TABLET | Freq: Every day | ORAL | 2 refills | Status: DC

## 2016-06-05 NOTE — Telephone Encounter (Signed)
Refilled metformin. Thanks!

## 2016-06-11 ENCOUNTER — Encounter: Payer: Self-pay | Admitting: Internal Medicine

## 2016-06-11 ENCOUNTER — Ambulatory Visit (INDEPENDENT_AMBULATORY_CARE_PROVIDER_SITE_OTHER): Payer: Self-pay | Admitting: Family Medicine

## 2016-06-11 ENCOUNTER — Encounter: Payer: Self-pay | Admitting: Family Medicine

## 2016-06-11 DIAGNOSIS — M545 Low back pain, unspecified: Secondary | ICD-10-CM

## 2016-06-11 DIAGNOSIS — M79604 Pain in right leg: Secondary | ICD-10-CM

## 2016-06-11 DIAGNOSIS — M79605 Pain in left leg: Secondary | ICD-10-CM

## 2016-06-11 MED ORDER — PREDNISONE 10 MG PO TABS: ORAL_TABLET | ORAL | 0 refills | Status: DC

## 2016-06-11 NOTE — Patient Instructions (Signed)
Start the prednisone dose pack as directed. We will go ahead with an MRI of your lumbar spine to assess for spinal stenosis. I will call you the business day following this to go over results and next steps.

## 2016-06-12 ENCOUNTER — Telehealth: Payer: Self-pay

## 2016-06-12 NOTE — Telephone Encounter (Signed)
Vaughan Basta can you redo the letter with dates of service ? Please advise. Thanks!

## 2016-06-12 NOTE — Telephone Encounter (Signed)
I do not know what dates we are talking about, exactly.

## 2016-06-13 NOTE — Assessment & Plan Note (Signed)
likely due to neuropathy though we discussed spinal stenosis can cause similar symptoms.  She will start prednisone dose pack and given weakness we will go ahead with MRI of lumbar spine.  Depending on results we may refer to neurology for evaluation.
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
## 2016-07-02 ENCOUNTER — Other Ambulatory Visit: Payer: Self-pay

## 2016-07-03 ENCOUNTER — Other Ambulatory Visit: Payer: Self-pay | Admitting: Infectious Diseases

## 2016-07-16 ENCOUNTER — Other Ambulatory Visit: Payer: Self-pay | Admitting: Family Medicine

## 2016-07-16 DIAGNOSIS — I639 Cerebral infarction, unspecified: Secondary | ICD-10-CM

## 2016-07-16 HISTORY — DX: Cerebral infarction, unspecified: I63.9

## 2016-07-17 ENCOUNTER — Ambulatory Visit: Payer: Self-pay | Admitting: Internal Medicine

## 2016-07-18 ENCOUNTER — Ambulatory Visit: Payer: Self-pay | Admitting: Family Medicine

## 2016-07-19 ENCOUNTER — Encounter (HOSPITAL_BASED_OUTPATIENT_CLINIC_OR_DEPARTMENT_OTHER): Payer: Self-pay | Admitting: Emergency Medicine

## 2016-07-19 ENCOUNTER — Emergency Department (HOSPITAL_BASED_OUTPATIENT_CLINIC_OR_DEPARTMENT_OTHER)
Admission: EM | Admit: 2016-07-19 | Discharge: 2016-07-19 | Disposition: A | Discharge status: Home / Self Care | Payer: Self-pay | Attending: Emergency Medicine | Admitting: Emergency Medicine

## 2016-07-19 DIAGNOSIS — G8929 Other chronic pain: Secondary | ICD-10-CM | POA: Insufficient documentation

## 2016-07-19 DIAGNOSIS — Z21 Asymptomatic human immunodeficiency virus [HIV] infection status: Secondary | ICD-10-CM | POA: Insufficient documentation

## 2016-07-19 DIAGNOSIS — Z79899 Other long term (current) drug therapy: Secondary | ICD-10-CM | POA: Insufficient documentation

## 2016-07-19 DIAGNOSIS — M5441 Lumbago with sciatica, right side: Secondary | ICD-10-CM | POA: Insufficient documentation

## 2016-07-19 DIAGNOSIS — I10 Essential (primary) hypertension: Secondary | ICD-10-CM | POA: Insufficient documentation

## 2016-07-19 DIAGNOSIS — E039 Hypothyroidism, unspecified: Secondary | ICD-10-CM | POA: Insufficient documentation

## 2016-07-19 DIAGNOSIS — Z7984 Long term (current) use of oral hypoglycemic drugs: Secondary | ICD-10-CM | POA: Insufficient documentation

## 2016-07-19 DIAGNOSIS — M5442 Lumbago with sciatica, left side: Secondary | ICD-10-CM | POA: Insufficient documentation

## 2016-07-19 DIAGNOSIS — F17210 Nicotine dependence, cigarettes, uncomplicated: Secondary | ICD-10-CM | POA: Insufficient documentation

## 2016-07-19 MED ORDER — METHOCARBAMOL 500 MG PO TABS: 500.0000 mg | ORAL_TABLET | Freq: Two times a day (BID) | ORAL | 0 refills | Status: DC | PRN

## 2016-07-19 MED ORDER — NAPROXEN 250 MG PO TABS: 250.0000 mg | ORAL_TABLET | Freq: Two times a day (BID) | ORAL | 0 refills | Status: DC

## 2016-07-19 MED FILL — METHOCARBAMOL 500 MG TABLET: 500 | 10 days supply | Qty: 20 | Fill #0

## 2016-07-19 MED FILL — NAPROXEN 250 MG TABLET: 250 | 10 days supply | Qty: 20 | Fill #0
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
## 2016-07-19 NOTE — ED Triage Notes (Addendum)
Intermittent low back pain x 2 weeks radiating down both legs. Denies injury. Hx of same. Pt was seen upstair by Dr. Barbaraann Barthel for same in November, advised to get MRI, been unable to schedule it. Pt states she does not want narcotics.

## 2016-07-25 ENCOUNTER — Other Ambulatory Visit: Payer: Self-pay

## 2016-07-30 ENCOUNTER — Ambulatory Visit: Payer: Self-pay | Admitting: Internal Medicine

## 2016-07-31 ENCOUNTER — Ambulatory Visit: Payer: Self-pay | Admitting: Family Medicine

## 2016-08-08 ENCOUNTER — Other Ambulatory Visit: Payer: Self-pay | Admitting: Internal Medicine

## 2016-08-08 ENCOUNTER — Other Ambulatory Visit: Payer: Self-pay | Admitting: Family Medicine

## 2016-08-16 NOTE — Telephone Encounter (Signed)
Finished

## 2016-09-05 ENCOUNTER — Other Ambulatory Visit: Payer: Self-pay

## 2016-09-05 ENCOUNTER — Ambulatory Visit (INDEPENDENT_AMBULATORY_CARE_PROVIDER_SITE_OTHER): Payer: Self-pay | Admitting: Family Medicine

## 2016-09-05 ENCOUNTER — Other Ambulatory Visit: Payer: Self-pay | Admitting: Family Medicine

## 2016-09-05 ENCOUNTER — Encounter: Payer: Self-pay | Admitting: Family Medicine

## 2016-09-05 VITALS — BP 137/89 | HR 80 | Temp 98.5°F | Resp 14 | Ht 69.0 in | Wt 199.0 lb

## 2016-09-05 DIAGNOSIS — F17200 Nicotine dependence, unspecified, uncomplicated: Secondary | ICD-10-CM

## 2016-09-05 DIAGNOSIS — R519 Headache, unspecified: Secondary | ICD-10-CM

## 2016-09-05 DIAGNOSIS — B20 Human immunodeficiency virus [HIV] disease: Secondary | ICD-10-CM

## 2016-09-05 DIAGNOSIS — Z800 Family history of malignant neoplasm of digestive organs: Secondary | ICD-10-CM

## 2016-09-05 DIAGNOSIS — R51 Headache: Secondary | ICD-10-CM

## 2016-09-05 DIAGNOSIS — G629 Polyneuropathy, unspecified: Secondary | ICD-10-CM

## 2016-09-05 DIAGNOSIS — R7303 Prediabetes: Secondary | ICD-10-CM

## 2016-09-05 DIAGNOSIS — I10 Essential (primary) hypertension: Secondary | ICD-10-CM

## 2016-09-05 DIAGNOSIS — E039 Hypothyroidism, unspecified: Secondary | ICD-10-CM

## 2016-09-05 LAB — COMPLETE METABOLIC PANEL WITH GFR
ALT: 48 U/L — AB (ref 6–29)
AST: 61 U/L — AB (ref 10–35)
Albumin: 3.9 g/dL (ref 3.6–5.1)
Alkaline Phosphatase: 58 U/L (ref 33–130)
BUN: 7 mg/dL (ref 7–25)
CHLORIDE: 105 mmol/L (ref 98–110)
CO2: 26 mmol/L (ref 20–31)
CREATININE: 0.86 mg/dL (ref 0.50–1.05)
Calcium: 8.9 mg/dL (ref 8.6–10.4)
GFR, Est African American: >89 mL/min (ref >=60)
GFR, Est Non African American: 78 mL/min (ref >=60)
GLUCOSE: 77 mg/dL (ref 65–99)
Potassium: 4.1 mmol/L (ref 3.5–5.3)
SODIUM: 139 mmol/L (ref 135–146)
TOTAL PROTEIN: 7.9 g/dL (ref 6.1–8.1)
Total Bilirubin: 0.4 mg/dL (ref 0.2–1.2)

## 2016-09-05 LAB — POCT URINALYSIS DIP (DEVICE)
Bilirubin Urine: NEGATIVE
Glucose, UA: NEGATIVE mg/dL
HGB URINE DIPSTICK: NEGATIVE
Ketones, ur: NEGATIVE mg/dL
LEUKOCYTES UA: NEGATIVE
Nitrite: NEGATIVE
PH: 5.5 (ref 5.0–8.0)
PROTEIN: NEGATIVE mg/dL
SPECIFIC GRAVITY, URINE: 1.025 (ref 1.005–1.030)
UROBILINOGEN UA: 0.2 mg/dL (ref 0.0–1.0)

## 2016-09-05 LAB — TSH: TSH: 5.14 m[IU]/L — AB

## 2016-09-05 MED ORDER — METFORMIN HCL 500 MG PO TABS: 500.0000 mg | ORAL_TABLET | Freq: Every day | ORAL | 2 refills | Status: DC

## 2016-09-05 MED ORDER — GABAPENTIN 300 MG PO CAPS: 300.0000 mg | ORAL_CAPSULE | Freq: Three times a day (TID) | ORAL | 1 refills | Status: DC

## 2016-09-05 MED ORDER — HYDROCHLOROTHIAZIDE 25 MG PO TABS: 25.0000 mg | ORAL_TABLET | Freq: Every day | ORAL | 5 refills | Status: DC

## 2016-09-05 NOTE — Patient Instructions (Addendum)
Hypothyroidism Hypothyroidism is a disorder of the thyroid. The thyroid is a large gland that is located in the lower front of the neck. The thyroid releases hormones that control how the body works. With hypothyroidism, the thyroid does not make enough of these hormones. What are the causes? Causes of hypothyroidism may include:  Viral infections.  Pregnancy.  Your own defense system (immune system) attacking your thyroid.  Certain medicines.  Birth defects.  Past radiation treatments to your head or neck.  Past treatment with radioactive iodine.  Past surgical removal of part or all of your thyroid.  Problems with the gland that is located in the center of your brain (pituitary). What are the signs or symptoms? Signs and symptoms of hypothyroidism may include:  Feeling as though you have no energy (lethargy).  Inability to tolerate cold.  Weight gain that is not explained by a change in diet or exercise habits.  Dry skin.  Coarse hair.  Menstrual irregularity.  Slowing of thought processes.  Constipation.  Sadness or depression. How is this diagnosed? Your health care provider may diagnose hypothyroidism with blood tests and ultrasound tests. How is this treated? Hypothyroidism is treated with medicine that replaces the hormones that your body does not make. After you begin treatment, it may take several weeks for symptoms to go away. Follow these instructions at home:  Take medicines only as directed by your health care provider.  If you start taking any new medicines, tell your health care provider.  Keep all follow-up visits as directed by your health care provider. This is important. As your condition improves, your dosage needs may change. You will need to have blood tests regularly so that your health care provider can watch your condition. Contact a health care provider if:  Your symptoms do not get better with treatment.  You are taking thyroid  replacement medicine and:  You sweat excessively.  You have tremors.  You feel anxious.  You lose weight rapidly.  You cannot tolerate heat.  You have emotional swings.  You have diarrhea.  You feel weak. Get help right away if:  You develop chest pain.  You develop an irregular heartbeat.  You develop a rapid heartbeat. This information is not intended to replace advice given to you by your health care provider. Make sure you discuss any questions you have with your health care provider. Document Released: 07/02/2005 Document Revised: 12/08/2015 Document Reviewed: 11/17/2013 Elsevier Interactive Patient Education  2017 [employer]  Hypertension Hypertension, commonly called high blood pressure, is when the force of blood pumping through your arteries is too strong. Your arteries are the blood vessels that carry blood from your heart throughout your body. A blood pressure reading consists of a higher number over a lower number, such as 110/72. The higher number (systolic) is the pressure inside your arteries when your heart pumps. The lower number (diastolic) is the pressure inside your arteries when your heart relaxes. Ideally you want your blood pressure below 120/80. Hypertension forces your heart to work harder to pump blood. Your arteries may become narrow or stiff. Having untreated or uncontrolled hypertension can cause heart attack, stroke, kidney disease, and other problems. What increases the risk? Some risk factors for high blood pressure are controllable. Others are not. Risk factors you cannot control include:  Race. You may be at higher risk if you are African American.  Age. Risk increases with age.  Gender. Men are at higher risk than women before age 61 years.  After age 54, women are at higher risk than men. Risk factors you can control include:  Not getting enough exercise or physical activity.  Being overweight.  Getting too much fat, sugar,  calories, or salt in your diet.  Drinking too much alcohol. What are the signs or symptoms? Hypertension does not usually cause signs or symptoms. Extremely high blood pressure (hypertensive crisis) may cause headache, anxiety, shortness of breath, and nosebleed. How is this diagnosed? To check if you have hypertension, your health care provider will measure your blood pressure while you are seated, with your arm held at the level of your heart. It should be measured at least twice using the same arm. Certain conditions can cause a difference in blood pressure between your right and left arms. A blood pressure reading that is higher than normal on one occasion does not mean that you need treatment. If it is not clear whether you have high blood pressure, you may be asked to return on a different day to have your blood pressure checked again. Or, you may be asked to monitor your blood pressure at home for 1 or more weeks. How is this treated? Treating high blood pressure includes making lifestyle changes and possibly taking medicine. Living a healthy lifestyle can help lower high blood pressure. You may need to change some of your habits. Lifestyle changes may include:  Following the DASH diet. This diet is high in fruits, vegetables, and whole grains. It is low in salt, red meat, and added sugars.  Keep your sodium intake below 2,300 mg per day.  Getting at least 30-45 minutes of aerobic exercise at least 4 times per week.  Losing weight if necessary.  Not smoking.  Limiting alcoholic beverages.  Learning ways to reduce stress. Your health care provider may prescribe medicine if lifestyle changes are not enough to get your blood pressure under control, and if one of the following is true:  You are 47-66 years of age and your systolic blood pressure is above 140.  You are 31 years of age or older, and your systolic blood pressure is above 150.  Your diastolic blood pressure is above  90.  You have diabetes, and your systolic blood pressure is over [id number] or your diastolic blood pressure is over 90.  You have kidney disease and your blood pressure is above 140/90.  You have heart disease and your blood pressure is above 140/90. Your personal target blood pressure may vary depending on your medical conditions, your age, and other factors. Follow these instructions at home:  Have your blood pressure rechecked as directed by your health care provider.  Take medicines only as directed by your health care provider. Follow the directions carefully. Blood pressure medicines must be taken as prescribed. The medicine does not work as well when you skip doses. Skipping doses also puts you at risk for problems.  Do not smoke.  Monitor your blood pressure at home as directed by your health care provider. Contact a health care provider if:  You think you are having a reaction to medicines taken.  You have recurrent headaches or feel dizzy.  You have swelling in your ankles.  You have trouble with your vision. Get help right away if:  You develop a severe headache or confusion.  You have unusual weakness, numbness, or feel faint.  You have severe chest or abdominal pain.  You vomit repeatedly.  You have trouble breathing. This information is not intended to replace advice given  to you by your health care provider. Make sure you discuss any questions you have with your health care provider. Document Released: 07/02/2005 Document Revised: 12/08/2015 Document Reviewed: 04/24/2013 Elsevier Interactive Patient Education  2017 [employer].  Tobacco Use Disorder Tobacco use disorder (TUD) is a mental disorder. It is the long-term use of tobacco in spite of related health problems or difficulty with normal life activities. Tobacco is most commonly smoked as cigarettes and less commonly as cigars or pipes. Smokeless chewing tobacco and snuff are also popular. People with TUD get  a feeling of extreme pleasure (euphoria) from using tobacco and have a desire to use it again and again. Repeated use of tobacco can cause problems. The addictive effects of tobacco are due mainly tothe ingredient nicotine. Nicotine also causes a rush of adrenaline (epinephrine) in the body. This leads to increased blood pressure, heart rate, and breathing rate. These changes may cause problems for people with high blood pressure, weak hearts, or lung disease. High doses of nicotine in children and pets can lead to seizures and death. Tobacco contains a number of other unsafe chemicals. These chemicals are especially harmful when inhaled as smoke and can damage almost every organ in the body. Smokers live shorter lives than nonsmokers and are at risk of dying from a number of diseases and cancers. Tobacco smoke can also cause health problems for nonsmokers (due to inhaling secondhand smoke). Smoking is also a fire hazard. TUD usually starts in the late teenage years and is most common in young adults between the ages of 66 and 70 years. People who start smoking earlier in life are more likely to continue smoking as adults. TUD is somewhat more common in men than women. People with TUD are at higher risk for using alcohol and other drugs of abuse. What increases the risk? Risk factors for TUD include:  Having family members with the disorder.  Being around people who use tobacco.  Having an existing mental health issue such as schizophrenia, depression, bipolar disorder, ADHD, or posttraumatic stress disorder (PTSD). What are the signs or symptoms? People with tobacco use disorder have two or more of the following signs and symptoms within 12 months:  Use of more tobacco over a longer period than intended.  Not able to cut down or control tobacco use.  A lot of time spent obtaining or using tobacco.  Strong desire or urge to use tobacco (craving). Cravings may last for 6 months or longer after  quitting.  Use of tobacco even when use leads to major problems at work, school, or home.  Use of tobacco even when use leads to relationship problems.  Giving up or cutting down on important life activities because of tobacco use.  Repeatedly using tobacco in situations where it puts you or others in physical danger, like smoking in bed.  Use of tobacco even when it is known that a physical or mental problem is likely related to tobacco use.  Physical problems are numerous and may include chronic bronchitis, emphysema, lung and other cancers, gum disease, high blood pressure, heart disease, and stroke.  Mental problems caused by tobacco may include difficulty sleeping and anxiety.  Need to use greater amounts of tobacco to get the same effect. This means you have developed a tolerance.  Withdrawal symptoms as a result of stopping or rapidly cutting back use. These symptoms may last a month or more after quitting and include the following:  Depressed, anxious, or irritable mood.  Difficulty  concentrating.  Increased appetite.  Restlessness or trouble sleeping.  Use of tobacco to avoid withdrawal symptoms. How is this diagnosed? Tobacco use disorder is diagnosed by your health care provider. A diagnosis may be made by:  Your health care provider asking questions about your tobacco use and any problems it may be causing.  A physical exam.  Lab tests.  You may be referred to a mental health professional or addiction specialist. The severity of tobacco use disorder depends on the number of signs and symptoms you have:  Mild-Two or three symptoms.  Moderate-Four or five symptoms.  Severe-Six or more symptoms. How is this treated? Many people with tobacco use disorder are unable to quit on their own and need help. Treatment options include the following:  Nicotine replacement therapy (NRT). NRT provides nicotine without the other harmful chemicals in tobacco. NRT gradually  lowers the dosage of nicotine in the body and reduces withdrawal symptoms. NRT is available in over-the-counter forms (gum, lozenges, and skin patches) as well as prescription forms (mouth inhaler and nasal spray).  Medicines.This may include:  Antidepressant medicine that may reduce nicotine cravings.  A medicine that acts on nicotine receptors in the brain to reduce cravings and withdrawal symptoms. It may also block the effects of tobacco in people with TUD who relapse.  Counseling or talk therapy. A form of talk therapy called behavioral therapy is commonly used to treat people with TUD. Behavioral therapy looks at triggers for tobacco use, how to avoid them, and how to cope with cravings. It is most effective in person or by phone but is also available in self-help forms (books and Internet websites).  Support groups. These provide emotional support, advice, and guidance for quitting tobacco. The most effective treatment for TUD is usually a combination of medicine, talk therapy, and support groups. Follow these instructions at home:  Keep all follow-up visits as directed by your health care provider. This is important.  Take medicines only as directed by your health care provider.  Check with your health care provider before starting new prescription or over-the-counter medicines. Contact a health care provider if:  You are not able to take your medicines as prescribed.  Treatment is not helping your TUD and your symptoms get worse. Get help right away if:  You have serious thoughts about hurting yourself or others.  You have trouble breathing, chest pain, sudden weakness, or sudden numbness in part of your body. This information is not intended to replace advice given to you by your health care provider. Make sure you discuss any questions you have with your health care provider. Document Released: 03/07/2004 Document Revised: 03/04/2016 Document Reviewed: 08/28/2013 Elsevier  Interactive Patient Education  2017 [employer].
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
## 2016-09-07 MED ORDER — LEVOTHYROXINE SODIUM 100 MCG PO TABS: 100.0000 ug | ORAL_TABLET | Freq: Every day | ORAL | 2 refills | Status: DC

## 2016-09-10 ENCOUNTER — Other Ambulatory Visit: Payer: Self-pay | Admitting: Family Medicine

## 2016-09-10 ENCOUNTER — Telehealth: Payer: Self-pay

## 2016-09-10 DIAGNOSIS — K029 Dental caries, unspecified: Secondary | ICD-10-CM

## 2016-09-10 NOTE — Telephone Encounter (Signed)
[place], Patient is asking for a dental referral for cavities, she states she does have orange card. Also is complaining of the cough medication not working. Is there anything different you can give her? Please advise. Thanks!

## 2016-09-18 ENCOUNTER — Other Ambulatory Visit: Payer: Self-pay

## 2016-09-19 ENCOUNTER — Other Ambulatory Visit (INDEPENDENT_AMBULATORY_CARE_PROVIDER_SITE_OTHER): Payer: Self-pay

## 2016-09-19 DIAGNOSIS — B20 Human immunodeficiency virus [HIV] disease: Secondary | ICD-10-CM

## 2016-09-19 DIAGNOSIS — Z113 Encounter for screening for infections with a predominantly sexual mode of transmission: Secondary | ICD-10-CM

## 2016-09-20 LAB — T-HELPER CELL (CD4) - (RCID CLINIC ONLY)
CD4 % Helper T Cell: 40 % (ref 33–55)
CD4 T Cell Abs: 1820 /uL (ref 400–2700)

## 2016-09-20 LAB — RPR

## 2016-09-21 LAB — HIV-1 RNA QUANT-NO REFLEX-BLD
HIV 1 RNA Quant: <20 copies/mL — AB
HIV-1 RNA Quant, Log: <1.3 Log copies/mL — AB

## 2016-09-24 ENCOUNTER — Ambulatory Visit (INDEPENDENT_AMBULATORY_CARE_PROVIDER_SITE_OTHER): Payer: Self-pay | Admitting: Family Medicine

## 2016-09-24 ENCOUNTER — Encounter: Payer: Self-pay | Admitting: Family Medicine

## 2016-09-24 ENCOUNTER — Encounter: Payer: Self-pay | Admitting: Internal Medicine

## 2016-09-24 DIAGNOSIS — M545 Low back pain: Secondary | ICD-10-CM

## 2016-09-24 DIAGNOSIS — M79604 Pain in right leg: Secondary | ICD-10-CM

## 2016-09-24 DIAGNOSIS — M79605 Pain in left leg: Secondary | ICD-10-CM

## 2016-09-24 MED ORDER — IBUPROFEN 800 MG PO TABS: 800.0000 mg | ORAL_TABLET | Freq: Three times a day (TID) | ORAL | 1 refills | Status: DC | PRN

## 2016-09-24 NOTE — Patient Instructions (Addendum)
Your MRI does not show anything that would account for your pain in your back, legs, burning and numbness. We will refer you to neurology for evaluation. Use cane to help with walking. After finishing the prednisone then take the ibuprofen 800mg  up to 3 times a day with food. Heat as needed for spasms. Low back exercises as directed daily if possible. Brace if this feels more supportive but don't rely on this.
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
## 2016-09-27 NOTE — Assessment & Plan Note (Signed)
reviewed MRI report and nothing on this that would account for the symptoms she's having into both of her legs.  Concern more for a neuropathy.  Will refer to neurology for further workup and evaluation.  She can continue with the prednisone she was given then transition to ibuprofen.  Heat for spasms.  Can try back brace.  Cane to help with ambulation.

## 2016-10-02 ENCOUNTER — Ambulatory Visit (INDEPENDENT_AMBULATORY_CARE_PROVIDER_SITE_OTHER): Payer: Self-pay | Admitting: Internal Medicine

## 2016-10-02 ENCOUNTER — Encounter: Payer: Self-pay | Admitting: Internal Medicine

## 2016-10-02 ENCOUNTER — Encounter: Payer: Self-pay | Admitting: Family Medicine

## 2016-10-02 VITALS — BP 135/93 | HR 83 | Temp 98.2°F | Wt 205.0 lb

## 2016-10-02 DIAGNOSIS — B182 Chronic viral hepatitis C: Secondary | ICD-10-CM

## 2016-10-02 DIAGNOSIS — M2550 Pain in unspecified joint: Secondary | ICD-10-CM

## 2016-10-02 DIAGNOSIS — B20 Human immunodeficiency virus [HIV] disease: Secondary | ICD-10-CM

## 2016-10-02 DIAGNOSIS — M79604 Pain in right leg: Secondary | ICD-10-CM

## 2016-10-02 DIAGNOSIS — M545 Low back pain: Secondary | ICD-10-CM

## 2016-10-02 DIAGNOSIS — Z113 Encounter for screening for infections with a predominantly sexual mode of transmission: Secondary | ICD-10-CM

## 2016-10-02 DIAGNOSIS — E781 Pure hyperglyceridemia: Secondary | ICD-10-CM

## 2016-10-02 DIAGNOSIS — M79605 Pain in left leg: Secondary | ICD-10-CM

## 2016-10-02 DIAGNOSIS — F1921 Other psychoactive substance dependence, in remission: Secondary | ICD-10-CM

## 2016-10-02 NOTE — Assessment & Plan Note (Signed)
She continues to be drug and alcohol free and I congratulated her.

## 2016-10-02 NOTE — Assessment & Plan Note (Signed)
Multiple issues and I do think it is appropriate to have her see a neurologist for further evaluation, partiuclarly with recent episodes of dropping things in her hands.

## 2016-10-02 NOTE — Assessment & Plan Note (Signed)
Doing great with this and no changes.  rtc 6 months.

## 2016-10-02 NOTE — Assessment & Plan Note (Addendum)
I will check her genotype next visit and consider treatment then   wiill also have her do hepatitis A and B vaccine

## 2016-10-02 NOTE — Progress Notes (Signed)
   Subjective:    Patient ID: [facility], female    DOB: 1966-03-05, 51 y.o.   MRN: [id number]  HPI Here for follow up of HIV.   Patient came 30 minutes late to the appt and has multiiple complaints related to neck pain, back pain, dropping things, peripheral neuropathy.  She also states she hurt her back at work 1 week ago and had an MRI of the back and only noted a cyst.  She continues on her HIV medication and CD4 is 1820 and viral load < 20.  No missed doses.  Did see workers comp for the new back pain.     Review of Systems  Constitutional: Negative for fatigue.  Gastrointestinal: Negative for diarrhea.  Musculoskeletal: Positive for arthralgias, back pain, neck pain and neck stiffness.  Neurological: Positive for weakness. Negative for dizziness.       Objective:   Physical Exam  Constitutional: She is oriented to person, place, and time. She appears well-developed and well-nourished.  Eyes: No scleral icterus.  Cardiovascular: Normal rate, regular rhythm and normal heart sounds.   Pulmonary/Chest: Effort normal and breath sounds normal. No respiratory distress.  Neurological: She is alert and oriented to person, place, and time.  Skin: No rash noted.   SH: she remains drug and alcohol free, now 2 years next month       Assessment & Plan:

## 2016-12-04 ENCOUNTER — Ambulatory Visit: Payer: Self-pay | Admitting: Family Medicine

## 2016-12-21 ENCOUNTER — Other Ambulatory Visit: Payer: Self-pay | Admitting: Family Medicine

## 2016-12-27 ENCOUNTER — Ambulatory Visit (INDEPENDENT_AMBULATORY_CARE_PROVIDER_SITE_OTHER): Payer: Self-pay | Admitting: Family Medicine

## 2016-12-27 ENCOUNTER — Encounter: Payer: Self-pay | Admitting: Family Medicine

## 2016-12-27 DIAGNOSIS — M79605 Pain in left leg: Secondary | ICD-10-CM

## 2016-12-27 DIAGNOSIS — M79604 Pain in right leg: Secondary | ICD-10-CM

## 2016-12-27 DIAGNOSIS — M545 Low back pain: Secondary | ICD-10-CM

## 2016-12-27 NOTE — Patient Instructions (Signed)
Keep your appointment with neurology next month - this is very important. Continue the cymbalta, gabapentin, and ibuprofen. You have room to go up on the gabapentin if needed. Follow up with me as needed.

## 2016-12-31 ENCOUNTER — Ambulatory Visit (INDEPENDENT_AMBULATORY_CARE_PROVIDER_SITE_OTHER): Payer: Self-pay | Admitting: Family Medicine

## 2016-12-31 ENCOUNTER — Encounter: Payer: Self-pay | Admitting: Family Medicine

## 2016-12-31 VITALS — BP 129/80 | HR 71 | Temp 97.9°F | Resp 16 | Ht 69.0 in | Wt 204.0 lb

## 2016-12-31 DIAGNOSIS — E039 Hypothyroidism, unspecified: Secondary | ICD-10-CM

## 2016-12-31 DIAGNOSIS — R7309 Other abnormal glucose: Secondary | ICD-10-CM

## 2016-12-31 DIAGNOSIS — B20 Human immunodeficiency virus [HIV] disease: Secondary | ICD-10-CM

## 2016-12-31 DIAGNOSIS — G40909 Epilepsy, unspecified, not intractable, without status epilepticus: Secondary | ICD-10-CM

## 2016-12-31 DIAGNOSIS — I10 Essential (primary) hypertension: Secondary | ICD-10-CM

## 2016-12-31 DIAGNOSIS — G629 Polyneuropathy, unspecified: Secondary | ICD-10-CM

## 2016-12-31 LAB — COMPLETE METABOLIC PANEL WITH GFR
ALT: 46 U/L — AB (ref 6–29)
AST: 52 U/L — AB (ref 10–35)
Albumin: 3.7 g/dL (ref 3.6–5.1)
Alkaline Phosphatase: 56 U/L (ref 33–130)
BUN: 22 mg/dL (ref 7–25)
CHLORIDE: 102 mmol/L (ref 98–110)
CO2: 29 mmol/L (ref 20–31)
CREATININE: 0.97 mg/dL (ref 0.50–1.05)
Calcium: 10 mg/dL (ref 8.6–10.4)
GFR, EST AFRICAN AMERICAN: 78 mL/min (ref >=60)
GFR, Est Non African American: 68 mL/min (ref >=60)
Glucose, Bld: 90 mg/dL (ref 65–99)
POTASSIUM: 3.6 mmol/L (ref 3.5–5.3)
Sodium: 140 mmol/L (ref 135–146)
Total Bilirubin: 0.4 mg/dL (ref 0.2–1.2)
Total Protein: 7.9 g/dL (ref 6.1–8.1)

## 2016-12-31 LAB — POCT URINALYSIS DIP (DEVICE)
Bilirubin Urine: NEGATIVE
Glucose, UA: NEGATIVE mg/dL
HGB URINE DIPSTICK: NEGATIVE
Ketones, ur: NEGATIVE mg/dL
LEUKOCYTES UA: NEGATIVE
NITRITE: NEGATIVE
PROTEIN: NEGATIVE mg/dL
Specific Gravity, Urine: 1.02 (ref 1.005–1.030)
Urobilinogen, UA: 0.2 mg/dL (ref 0.0–1.0)
pH: 5.5 (ref 5.0–8.0)

## 2016-12-31 LAB — POCT GLYCOSYLATED HEMOGLOBIN (HGB A1C): HEMOGLOBIN A1C: 5.8

## 2016-12-31 LAB — TSH: TSH: 3.05 mIU/L
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
## 2016-12-31 NOTE — Patient Instructions (Addendum)
Hypothyroidism:  Will follow up by phone with laboratory results Continue Levothyroxine 100 mcg 1 hour prior to eating breakfast  Prediabetes:  Hemoglobin a1C is 5.8. Will continue metformin at current dosage  Neuropathy: Reviewed images from [facility]. I will defer to neurology for further treatment and evaluation.   HIV; Follow up with Dr. [name] at scheduled    Hypertension:  Blood pressure is at goal on current medication regimen.  Recommend lowfat/low sodium diet   Follow up in 3 months for chronic conditions Hypothyroidism Hypothyroidism is a disorder of the thyroid. The thyroid is a large gland that is located in the lower front of the neck. The thyroid releases hormones that control how the body works. With hypothyroidism, the thyroid does not make enough of these hormones. What are the causes? Causes of hypothyroidism may include:  Viral infections.  Pregnancy.  Your own defense system (immune system) attacking your thyroid.  Certain medicines.  Birth defects.  Past radiation treatments to your head or neck.  Past treatment with radioactive iodine.  Past surgical removal of part or all of your thyroid.  Problems with the gland that is located in the center of your brain (pituitary).  What are the signs or symptoms? Signs and symptoms of hypothyroidism may include:  Feeling as though you have no energy (lethargy).  Inability to tolerate cold.  Weight gain that is not explained by a change in diet or exercise habits.  Dry skin.  Coarse hair.  Menstrual irregularity.  Slowing of thought processes.  Constipation.  Sadness or depression.  How is this diagnosed? Your health care provider may diagnose hypothyroidism with blood tests and ultrasound tests. How is this treated? Hypothyroidism is treated with medicine that replaces the hormones that your body does not make. After you begin treatment, it may take several weeks for symptoms to go  away. Follow these instructions at home:  Take medicines only as directed by your health care provider.  If you start taking any new medicines, tell your health care provider.  Keep all follow-up visits as directed by your health care provider. This is important. As your condition improves, your dosage needs may change. You will need to have blood tests regularly so that your health care provider can watch your condition. Contact a health care provider if:  Your symptoms do not get better with treatment.  You are taking thyroid replacement medicine and: ? You sweat excessively. ? You have tremors. ? You feel anxious. ? You lose weight rapidly. ? You cannot tolerate heat. ? You have emotional swings. ? You have diarrhea. ? You feel weak. Get help right away if:  You develop chest pain.  You develop an irregular heartbeat.  You develop a rapid heartbeat. This information is not intended to replace advice given to you by your health care provider. Make sure you discuss any questions you have with your health care provider. Document Released: 07/02/2005 Document Revised: 12/08/2015 Document Reviewed: 11/17/2013 Elsevier Interactive Patient Education  2017 [place] DASH stands for "Dietary Approaches to Stop Hypertension." The DASH eating plan is a healthy eating plan that has been shown to reduce high blood pressure (hypertension). It may also reduce your risk for type 2 diabetes, heart disease, and stroke. The DASH eating plan may also help with weight loss. What are tips for following this plan? General guidelines  Avoid eating more than 2,300 mg (milligrams) of salt (sodium) a day. If you have hypertension, you may need  to reduce your sodium intake to 1,500 mg a day.  Limit alcohol intake to no more than 1 drink a day for nonpregnant women and 2 drinks a day for men. One drink equals 12 oz of beer, 5 oz of wine, or 1 oz of hard liquor.  Work with your  health care provider to maintain a healthy body weight or to lose weight. Ask what an ideal weight is for you.  Get at least 30 minutes of exercise that causes your heart to beat faster (aerobic exercise) most days of the week. Activities may include walking, swimming, or biking.  Work with your health care provider or diet and nutrition specialist (dietitian) to adjust your eating plan to your individual calorie needs. Reading food labels  Check food labels for the amount of sodium per serving. Choose foods with less than 5 percent of the Daily Value of sodium. Generally, foods with less than 300 mg of sodium per serving fit into this eating plan.  To find whole grains, look for the word "whole" as the first word in the ingredient list. Shopping  Buy products labeled as "low-sodium" or "no salt added."  Buy fresh foods. Avoid canned foods and premade or frozen meals. Cooking  Avoid adding salt when cooking. Use salt-free seasonings or herbs instead of table salt or sea salt. Check with your health care provider or pharmacist before using salt substitutes.  Do not fry foods. Cook foods using healthy methods such as baking, boiling, grilling, and broiling instead.  Cook with heart-healthy oils, such as olive, canola, soybean, or sunflower oil. Meal planning   Eat a balanced diet that includes: ? 5 or more servings of fruits and vegetables each day. At each meal, try to fill half of your plate with fruits and vegetables. ? Up to 6-8 servings of whole grains each day. ? Less than 6 oz of lean meat, poultry, or fish each day. A 3-oz serving of meat is about the same size as a deck of cards. One egg equals 1 oz. ? 2 servings of low-fat dairy each day. ? A serving of nuts, seeds, or beans 5 times each week. ? Heart-healthy fats. Healthy fats called Omega-3 fatty acids are found in foods such as flaxseeds and coldwater fish, like sardines, salmon, and mackerel.  Limit how much you eat of  the following: ? Canned or prepackaged foods. ? Food that is high in trans fat, such as fried foods. ? Food that is high in saturated fat, such as fatty meat. ? Sweets, desserts, sugary drinks, and other foods with added sugar. ? Full-fat dairy products.  Do not salt foods before eating.  Try to eat at least 2 vegetarian meals each week.  Eat more home-cooked food and less restaurant, buffet, and fast food.  When eating at a restaurant, ask that your food be prepared with less salt or no salt, if possible. What foods are recommended? The items listed may not be a complete list. Talk with your dietitian about what dietary choices are best for you. Grains Whole-grain or whole-wheat bread. Whole-grain or whole-wheat pasta. Brown rice. [place]. Bulgur. Whole-grain and low-sodium cereals. Pita bread. Low-fat, low-sodium crackers. Whole-wheat flour tortillas. Vegetables Fresh or frozen vegetables (raw, steamed, roasted, or grilled). Low-sodium or reduced-sodium tomato and vegetable juice. Low-sodium or reduced-sodium tomato sauce and tomato paste. Low-sodium or reduced-sodium canned vegetables. Fruits All fresh, dried, or frozen fruit. Canned fruit in natural juice (without added sugar). Meat and other protein foods  Skinless chicken or [place]. Ground chicken or [place]. Pork with fat trimmed off. Fish and seafood. Egg whites. Dried beans, peas, or lentils. Unsalted nuts, nut butters, and seeds. Unsalted canned beans. Lean cuts of beef with fat trimmed off. Low-sodium, lean deli meat. Dairy Low-fat (1%) or fat-free (skim) milk. Fat-free, low-fat, or reduced-fat cheeses. Nonfat, low-sodium ricotta or cottage cheese. Low-fat or nonfat yogurt. Low-fat, low-sodium cheese. Fats and oils Soft margarine without trans fats. Vegetable oil. Low-fat, reduced-fat, or light mayonnaise and salad dressings (reduced-sodium). Canola, safflower, olive, soybean, and sunflower oils. Avocado. Seasoning and  other foods Herbs. Spices. Seasoning mixes without salt. Unsalted popcorn and pretzels. Fat-free sweets. What foods are not recommended? The items listed may not be a complete list. Talk with your dietitian about what dietary choices are best for you. Grains Baked goods made with fat, such as croissants, muffins, or some breads. Dry pasta or rice meal packs. Vegetables Creamed or fried vegetables. Vegetables in a cheese sauce. Regular canned vegetables (not low-sodium or reduced-sodium). Regular canned tomato sauce and paste (not low-sodium or reduced-sodium). Regular tomato and vegetable juice (not low-sodium or reduced-sodium). Angie Fava. Olives. Fruits Canned fruit in a light or heavy syrup. Fried fruit. Fruit in cream or butter sauce. Meat and other protein foods Fatty cuts of meat. Ribs. Fried meat. Berniece Salines. Sausage. Bologna and other processed lunch meats. Salami. Fatback. Hotdogs. Bratwurst. Salted nuts and seeds. Canned beans with added salt. Canned or smoked fish. Whole eggs or egg yolks. Chicken or [place] with skin. Dairy Whole or 2% milk, cream, and half-and-half. Whole or full-fat cream cheese. Whole-fat or sweetened yogurt. Full-fat cheese. Nondairy creamers. Whipped toppings. Processed cheese and cheese spreads. Fats and oils Butter. Stick margarine. Lard. Shortening. Ghee. Bacon fat. Tropical oils, such as coconut, palm kernel, or palm oil. Seasoning and other foods Salted popcorn and pretzels. Onion salt, garlic salt, seasoned salt, table salt, and sea salt. Worcestershire sauce. Tartar sauce. Barbecue sauce. Teriyaki sauce. Soy sauce, including reduced-sodium. Steak sauce. Canned and packaged gravies. Fish sauce. Oyster sauce. Cocktail sauce. Horseradish that you find on the shelf. Ketchup. Mustard. Meat flavorings and tenderizers. Bouillon cubes. Hot sauce and Tabasco sauce. Premade or packaged marinades. Premade or packaged taco seasonings. Relishes. Regular salad dressings. Where to  find more information:  National Heart, Lung, and [place]: https://wilson-eaton.com/  American Heart Association: www.heart.org Summary  The DASH eating plan is a healthy eating plan that has been shown to reduce high blood pressure (hypertension). It may also reduce your risk for type 2 diabetes, heart disease, and stroke.  With the DASH eating plan, you should limit salt (sodium) intake to 2,300 mg a day. If you have hypertension, you may need to reduce your sodium intake to 1,500 mg a day.  When on the DASH eating plan, aim to eat more fresh fruits and vegetables, whole grains, lean proteins, low-fat dairy, and heart-healthy fats.  Work with your health care provider or diet and nutrition specialist (dietitian) to adjust your eating plan to your individual calorie needs. This information is not intended to replace advice given to you by your health care provider. Make sure you discuss any questions you have with your health care provider. Document Released: 06/21/2011 Document Revised: 06/25/2016 Document Reviewed: 06/25/2016 Elsevier Interactive Patient Education  2017 [employer].

## 2017-01-01 ENCOUNTER — Telehealth: Payer: Self-pay

## 2017-01-01 NOTE — Telephone Encounter (Signed)
-----   Message from Cheryl Thompson,  sent at 12/31/2016 11:15 PM EDT ----- Regarding: lab results Please inform [name] that we will continue current dosage of levothyroxine, TSH is in range. Please follow up in office as scheduled.   Thanks ----- Message ----- From: Cheryl Mings, LPN Sent: [id number]  10:13 AM To: Cheryl Dew, FNP

## 2017-01-01 NOTE — Telephone Encounter (Signed)
Called home phone, no answer and no answering machine. Called cell phone number and left message advising that thyroid levels are in range and to continue current dose of medication as directed. Asked that patient keep next scheduled appointment and to call back if any questions. Thanks!
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
## 2017-01-03 NOTE — Assessment & Plan Note (Signed)
reviewed repeat MRIs from 6/13 and again nothing noted on lumbar and thoracic spines that would account for her pain into both legs.  Consistent with a neuropathy - due to see neurology.  Continue with gabapentin (discussed has room to go up on this), cymbalta, ibuprofen.  Cane, heat for spasms.  Do not think there's anything further to offer from an orthopedic standpoint.

## 2017-01-17 ENCOUNTER — Ambulatory Visit: Payer: Self-pay | Admitting: Internal Medicine

## 2017-01-17 ENCOUNTER — Ambulatory Visit: Payer: Self-pay

## 2017-01-30 ENCOUNTER — Ambulatory Visit: Payer: Self-pay

## 2017-02-01 ENCOUNTER — Ambulatory Visit (INDEPENDENT_AMBULATORY_CARE_PROVIDER_SITE_OTHER): Payer: Self-pay | Admitting: Family Medicine

## 2017-02-01 ENCOUNTER — Encounter: Payer: Self-pay | Admitting: Family Medicine

## 2017-02-01 VITALS — BP 124/81 | HR 75 | Temp 98.4°F | Resp 14 | Ht 69.0 in | Wt 210.0 lb

## 2017-02-01 DIAGNOSIS — G629 Polyneuropathy, unspecified: Secondary | ICD-10-CM

## 2017-02-01 DIAGNOSIS — R531 Weakness: Secondary | ICD-10-CM

## 2017-02-01 DIAGNOSIS — Z8673 Personal history of transient ischemic attack (TIA), and cerebral infarction without residual deficits: Secondary | ICD-10-CM

## 2017-02-01 DIAGNOSIS — E559 Vitamin D deficiency, unspecified: Secondary | ICD-10-CM

## 2017-02-01 MED ORDER — ERGOCALCIFEROL 1.25 MG (50000 UT) PO CAPS: 50000.0000 [IU] | ORAL_CAPSULE | ORAL | 5 refills | Status: DC

## 2017-02-01 NOTE — Patient Instructions (Addendum)
Will complete forms for employer disability.  Will fax and save a copy.  Follow up with neurology as scheduled  Transient Ischemic Attack A transient ischemic attack (TIA) is a "warning stroke" that causes stroke-like symptoms. Unlike a stroke, a TIA does not cause permanent damage to the brain. The symptoms of a TIA can happen very fast and do not last long. It is important to know the symptoms of a TIA and what to do. This can help prevent a major stroke or death. What are the causes? A TIA is caused by a temporary blockage in an artery in the brain or neck (carotid artery). The blockage does not allow the brain to get the blood supply it needs and can cause different symptoms. The blockage can be caused by either:  A blood clot.  Fatty buildup (plaque) in a neck or brain artery.  What increases the risk?  High blood pressure (hypertension).  High cholesterol.  Diabetes mellitus.  Heart disease.  The buildup of plaque in the blood vessels (peripheral artery disease or atherosclerosis).  The buildup of plaque in the blood vessels that provide blood and oxygen to the brain (carotid artery stenosis).  An abnormal heart rhythm (atrial fibrillation).  Obesity.  Using any tobacco products, including cigarettes, chewing tobacco, or electronic cigarettes.  Taking oral contraceptives, especially in combination with using tobacco.  Physical inactivity.  A diet high in fats, salt (sodium), and calories.  Excessive alcohol use.  Use of illegal drugs (especially cocaine and methamphetamine).  Being female.  Being African American.  Being over the age of 46 years.  Family history of stroke.  Previous history of blood clots, stroke, TIA, or heart attack.  Sickle cell disease. What are the signs or symptoms? TIA symptoms are the same as a stroke but are temporary. These symptoms usually develop suddenly, or may be newly present upon waking from sleep:  Sudden weakness or  numbness of the face, arm, or leg, especially on one side of the body.  Sudden trouble walking or difficulty moving arms or legs.  Sudden confusion.  Sudden personality changes.  Trouble speaking (aphasia) or understanding.  Difficulty swallowing.  Sudden trouble seeing in one or both eyes.  Double vision.  Dizziness.  Loss of balance or coordination.  Sudden severe headache with no known cause.  Trouble reading or writing.  Loss of bowel or bladder control.  Loss of consciousness.  How is this diagnosed? Your health care provider may be able to determine the presence or absence of a TIA based on your symptoms, history, and physical exam. CT scan of the brain is usually performed to help identify a TIA. Other tests may include:  Electrocardiography (ECG).  Continuous heart monitoring.  Echocardiography.  Carotid ultrasonography.  MRI.  A scan of the brain circulation.  Blood tests.  How is this treated? Since the symptoms of TIA are the same as a stroke, it is important to seek treatment as soon as possible. You may need a medicine to dissolve a blood clot (thrombolytic) if that is the cause of the TIA. This medicine cannot be given if too much time has passed. Treatment may also include:  Rest, oxygen, fluids through an IV tube, and medicines to thin the blood (anticoagulants).  Measures will be taken to prevent short-term and long-term complications, including infection from breathing foreign material into the lungs (aspiration pneumonia), blood clots in the legs, and falls.  Procedures to either remove plaque in the carotid arteries or dilate  carotid arteries that have narrowed due to plaque. Those procedures are: ? Carotid endarterectomy. ? Carotid angioplasty and stenting.  Medicines and diet may be used to address diabetes, high blood pressure, and other underlying risk factors.  Follow these instructions at home:  Take medicines only as directed by  your health care provider. Follow the directions carefully. Medicines may be used to control risk factors for a stroke. Be sure you understand all your medicine instructions.  You may be told to take aspirin or the anticoagulant warfarin. Warfarin needs to be taken exactly as instructed. ? Taking too much or too little warfarin is dangerous. Too much warfarin increases the risk of bleeding. Too little warfarin continues to allow the risk for blood clots. While taking warfarin, you will need to have regular blood tests to measure your blood clotting time. A PT blood test measures how long it takes for blood to clot. Your PT is used to calculate another value called an INR. Your PT and INR help your health care provider to adjust your dose of warfarin. The dose can change for many reasons. It is critically important that you take warfarin exactly as prescribed. ? Many foods, especially foods high in vitamin K can interfere with warfarin and affect the PT and INR. Foods high in vitamin K include spinach, kale, broccoli, cabbage, collard and turnip greens, Brussels sprouts, peas, cauliflower, seaweed, and parsley, as well as beef and pork liver, green tea, and soybean oil. You should eat a consistent amount of foods high in vitamin K. Avoid major changes in your diet, or notify your health care provider before changing your diet. Arrange a visit with a dietitian to answer your questions. ? Many medicines can interfere with warfarin and affect the PT and INR. You must tell your health care provider about any and all medicines you take; this includes all vitamins and supplements. Be especially cautious with aspirin and anti-inflammatory medicines. Do not take or discontinue any prescribed or over-the-counter medicine except on the advice of your health care provider or pharmacist. ? Warfarin can have side effects, such as excessive bruising or bleeding. You will need to hold pressure over cuts for longer than usual.  Your health care provider or pharmacist will discuss other potential side effects. ? Avoid sports or activities that may cause injury or bleeding. ? Be careful when shaving, flossing your teeth, or handling sharp objects. ? Alcohol can change the body's ability to handle warfarin. It is best to avoid alcoholic drinks or consume only very small amounts while taking warfarin. Notify your health care provider if you change your alcohol intake. ? Notify your dentist or other health care providers before procedures.  Eat a diet that includes 5 or more servings of fruits and vegetables each day. This may reduce the risk of stroke. Certain diets may be prescribed to address high blood pressure, high cholesterol, diabetes, or obesity. ? A diet low in sodium, saturated fat, trans fat, and cholesterol is recommended to manage high blood pressure. ? A diet low in saturated fat, trans fat, and cholesterol, and high in fiber may control cholesterol levels. ? A controlled-carbohydrate, controlled-sugar diet is recommended to manage diabetes. ? A reduced-calorie diet that is low in sodium, saturated fat, trans fat, and cholesterol is recommended to manage obesity.  Maintain a healthy weight.  Stay physically active. It is recommended that you get at least 30 minutes of activity on most or all days.  Do not use any tobacco products,  including cigarettes, chewing tobacco, or electronic cigarettes. If you need help quitting, ask your health care provider.  Limit alcohol intake to no more than 1 drink per day for nonpregnant women and 2 drinks per day for men. One drink equals 12 ounces of beer, 5 ounces of wine, or 1 ounces of hard liquor.  Do not abuse drugs.  A safe home environment is important to reduce the risk of falls. Your health care provider may arrange for specialists to evaluate your home. Having grab bars in the bedroom and bathroom is often important. Your health care provider may arrange for  equipment to be used at home, such as raised toilets and a seat for the shower.  Follow all instructions for follow-up with your health care provider. This is very important. This includes any referrals and lab tests. Proper follow-up can prevent a stroke or another TIA from occurring. How is this prevented? The risk of a TIA can be decreased by appropriately treating high blood pressure, high cholesterol, diabetes, heart disease, and obesity, and by quitting smoking, limiting alcohol, and staying physically active. Contact a health care provider if:  You have personality changes.  You have difficulty swallowing.  You are seeing double.  You have dizziness.  You have a fever. Get help right away if: Any of the following symptoms may represent a serious problem that is an emergency. Do not wait to see if the symptoms will go away. Get medical help right away. Call your local emergency services (911 in [place] Do not drive yourself to the hospital.  You have sudden weakness or numbness of the face, arm, or leg, especially on one side of the body.  You have sudden trouble walking or difficulty moving arms or legs.  You have sudden confusion.  You have trouble speaking (aphasia) or understanding.  You have sudden trouble seeing in one or both eyes.  You have a loss of balance or coordination.  You have a sudden, severe headache with no known cause.  You have new chest pain or an irregular heartbeat.  You have a partial or total loss of consciousness.  This information is not intended to replace advice given to you by your health care provider. Make sure you discuss any questions you have with your health care provider. Document Released: 04/11/2005 Document Revised: 03/05/2016 Document Reviewed: 10/07/2013 Elsevier Interactive Patient Education  2017 [employer].
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
## 2017-02-04 ENCOUNTER — Telehealth: Payer: Self-pay | Admitting: Family Medicine

## 2017-02-05 NOTE — Telephone Encounter (Signed)
Note opened in error.    Cheryl Pounds  MSN, FNP-C [place] [address] [place], [place] [address] [phone]

## 2017-02-11 ENCOUNTER — Ambulatory Visit: Payer: Self-pay

## 2017-02-13 ENCOUNTER — Telehealth: Payer: Self-pay | Admitting: Internal Medicine

## 2017-02-13 ENCOUNTER — Other Ambulatory Visit: Payer: Self-pay | Admitting: *Deleted

## 2017-02-13 DIAGNOSIS — G629 Polyneuropathy, unspecified: Secondary | ICD-10-CM

## 2017-02-13 DIAGNOSIS — B20 Human immunodeficiency virus [HIV] disease: Secondary | ICD-10-CM

## 2017-02-13 MED ORDER — EMTRICITABINE-TENOFOVIR AF 200-25 MG PO TABS: 1.0000 | ORAL_TABLET | Freq: Every day | ORAL | 3 refills | Status: DC

## 2017-02-13 MED ORDER — DULOXETINE HCL 60 MG PO CPEP: ORAL_CAPSULE | ORAL | 3 refills | Status: DC

## 2017-02-13 MED ORDER — DARUNAVIR-COBICISTAT 800-150 MG PO TABS: ORAL_TABLET | ORAL | 3 refills | Status: DC

## 2017-02-13 NOTE — Telephone Encounter (Signed)
Sent refills. thanks

## 2017-02-13 NOTE — Telephone Encounter (Signed)
[facility] is calling to check on patient's medication regimen to see if there have been any changes.  They are needing new prescriptions sent to specialty pharmacy.

## 2017-02-14 ENCOUNTER — Other Ambulatory Visit: Payer: Self-pay | Admitting: Family Medicine

## 2017-02-19 ENCOUNTER — Telehealth: Payer: Self-pay

## 2017-02-19 NOTE — Telephone Encounter (Signed)
Called, no answer. Left a message for patient to call back. Thanks!  

## 2017-02-19 NOTE — Telephone Encounter (Signed)
Patient called back. She states that the Fenofibrate was making her nauseous and ill. After reviewing this, we are not the prescriber of this medication. Patient states her "stroke doctor" Dr. Terence Lux order this for her. I advised her to please call their office and advise them of the situation and they should determine if the medication needs to be changed or altered. Patient verbalized understanding. Thanks!

## 2017-02-22 ENCOUNTER — Encounter: Payer: Self-pay | Admitting: Family Medicine

## 2017-02-22 ENCOUNTER — Ambulatory Visit (INDEPENDENT_AMBULATORY_CARE_PROVIDER_SITE_OTHER): Payer: Self-pay | Admitting: Family Medicine

## 2017-02-22 VITALS — BP 128/78 | HR 81 | Temp 97.9°F | Resp 16 | Ht 69.0 in | Wt 207.0 lb

## 2017-02-22 DIAGNOSIS — R079 Chest pain, unspecified: Secondary | ICD-10-CM

## 2017-02-22 DIAGNOSIS — L309 Dermatitis, unspecified: Secondary | ICD-10-CM

## 2017-02-22 LAB — COMPLETE METABOLIC PANEL WITH GFR
ALBUMIN: 4.2 g/dL (ref 3.6–5.1)
ALK PHOS: 56 U/L (ref 33–130)
ALT: 106 U/L — AB (ref 6–29)
AST: 117 U/L — AB (ref 10–35)
BUN: 20 mg/dL (ref 7–25)
CO2: 27 mmol/L (ref 20–32)
Calcium: 9.4 mg/dL (ref 8.6–10.4)
Chloride: 98 mmol/L (ref 98–110)
Creat: 1.15 mg/dL — ABNORMAL HIGH (ref 0.50–1.05)
GFR, Est African American: 64 mL/min (ref >=60)
GFR, Est Non African American: 55 mL/min — ABNORMAL LOW (ref >=60)
GLUCOSE: 96 mg/dL (ref 65–99)
POTASSIUM: 3.2 mmol/L — AB (ref 3.5–5.3)
SODIUM: 136 mmol/L (ref 135–146)
TOTAL PROTEIN: 8.4 g/dL — AB (ref 6.1–8.1)
Total Bilirubin: 0.6 mg/dL (ref 0.2–1.2)

## 2017-02-22 LAB — TSH: TSH: 4.6 mIU/L — ABNORMAL HIGH

## 2017-02-22 MED ORDER — FLUOCINONIDE 0.1 % EX CREA: 1.0000 | TOPICAL_CREAM | Freq: Two times a day (BID) | CUTANEOUS | 0 refills | Status: AC

## 2017-02-22 NOTE — Patient Instructions (Addendum)
Fluocinonide 0.01% apply to affected areas twice daily Recommend Benadryl 12.5 mg every 6 hours as needed for itching Apply cool compresses to upper extremities to assist with itching   Abnormal EKG: Will send a referral to cardiology for further work up and evaluation   Contact Dermatitis Dermatitis is redness, soreness, and swelling (inflammation) of the skin. Contact dermatitis is a reaction to certain substances that touch the skin. You either touched something that irritated your skin, or you have allergies to something you touched. Follow these instructions at home: [place] your skin as needed.  Apply cool compresses to the affected areas.  Try taking a bath with: ? Epsom salts. Follow the instructions on the package. You can get these at a pharmacy or grocery store. ? Baking soda. Pour a small amount into the bath as told by your doctor. ? Colloidal oatmeal. Follow the instructions on the package. You can get this at a pharmacy or grocery store.  Try applying baking soda paste to your skin. Stir water into baking soda until it looks like paste.  Do not scratch your skin.  Bathe less often.  Bathe in lukewarm water. Avoid using hot water. Medicines  Take or apply over-the-counter and prescription medicines only as told by your doctor.  If you were prescribed an antibiotic medicine, take or apply your antibiotic as told by your doctor. Do not stop taking the antibiotic even if your condition starts to get better. General instructions  Keep all follow-up visits as told by your doctor. This is important.  Avoid the substance that caused your reaction. If you do not know what caused it, keep a journal to try to track what caused it. Write down: ? What you eat. ? What cosmetic products you use. ? What you drink. ? What you wear in the affected area. This includes jewelry.  If you were given a bandage (dressing), take care of it as told by your doctor. This  includes when to change and remove it. Contact a doctor if:  You do not get better with treatment.  Your condition gets worse.  You have signs of infection such as: ? Swelling. ? Tenderness. ? Redness. ? Soreness. ? Warmth.  You have a fever.  You have new symptoms. Get help right away if:  You have a very bad headache.  You have neck pain.  Your neck is stiff.  You throw up (vomit).  You feel very sleepy.  You see red streaks coming from the affected area.  Your bone or joint underneath the affected area becomes painful after the skin has healed.  The affected area turns darker.  You have trouble breathing. This information is not intended to replace advice given to you by your health care provider. Make sure you discuss any questions you have with your health care provider. Document Released: 04/29/2009 Document Revised: 12/08/2015 Document Reviewed: 11/17/2014 Elsevier Interactive Patient Education  2018 [employer].
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
## 2017-02-24 ENCOUNTER — Other Ambulatory Visit: Payer: Self-pay | Admitting: Family Medicine

## 2017-02-27 ENCOUNTER — Other Ambulatory Visit: Payer: Self-pay | Admitting: Family Medicine

## 2017-02-27 ENCOUNTER — Ambulatory Visit: Payer: Self-pay | Admitting: Internal Medicine

## 2017-02-27 DIAGNOSIS — E039 Hypothyroidism, unspecified: Secondary | ICD-10-CM

## 2017-02-27 MED ORDER — LEVOTHYROXINE SODIUM 112 MCG PO TABS: 112.0000 ug | ORAL_TABLET | Freq: Every day | ORAL | 5 refills | Status: DC

## 2017-02-27 NOTE — Progress Notes (Signed)
Meds ordered this encounter  Medications  . levothyroxine (SYNTHROID, LEVOTHROID) 112 MCG tablet    Sig: Take 1 tablet (112 mcg total) by mouth daily before breakfast. Reported on 12/13/2015    Dispense:  30 tablet    Refill:  [place]  MSN, [facility] [place], [place] [address] [phone]

## 2017-02-27 NOTE — Progress Notes (Signed)
Called, no answer. Left message for patient to call back. Thanks!  

## 2017-02-28 NOTE — Progress Notes (Signed)
Called, no answer.

## 2017-03-01 NOTE — Progress Notes (Signed)
Called both home and cell numbers, no answer. Will mail a letter to patient.

## 2017-03-05 ENCOUNTER — Ambulatory Visit: Payer: Self-pay | Admitting: Internal Medicine

## 2017-04-02 ENCOUNTER — Ambulatory Visit: Payer: Self-pay | Admitting: Family Medicine

## 2017-04-10 ENCOUNTER — Ambulatory Visit (INDEPENDENT_AMBULATORY_CARE_PROVIDER_SITE_OTHER): Payer: Self-pay | Admitting: Family Medicine

## 2017-04-10 VITALS — BP 136/84 | HR 68 | Temp 97.7°F | Resp 16 | Ht 69.0 in | Wt 203.0 lb

## 2017-04-10 DIAGNOSIS — G629 Polyneuropathy, unspecified: Secondary | ICD-10-CM

## 2017-04-10 DIAGNOSIS — B20 Human immunodeficiency virus [HIV] disease: Secondary | ICD-10-CM

## 2017-04-10 MED ORDER — PREGABALIN 25 MG PO CAPS: ORAL_CAPSULE | ORAL | 0 refills | Status: DC

## 2017-04-10 NOTE — Patient Instructions (Signed)
Will increase Lyrica to 50 mg in am, 25 mg at noon, and 25 at bedtime. Refrain from drinking, driving, or operating machinery while taking this medication.   Pregabalin capsules What is this medicine? PREGABALIN (pre GAB a lin) is used to treat nerve pain from diabetes, shingles, spinal cord injury, and fibromyalgia. It is also used to control seizures in epilepsy. This medicine may be used for other purposes; ask your health care provider or pharmacist if you have questions. COMMON BRAND NAME(S): Lyrica What should I tell my health care provider before I take this medicine? They need to know if you have any of these conditions: -bleeding problems -heart disease, including heart failure -history of alcohol or drug abuse -kidney disease -suicidal thoughts, plans, or attempt; a previous suicide attempt by you or a family member -an unusual or allergic reaction to pregabalin, gabapentin, other medicines, foods, dyes, or preservatives -pregnant or trying to get pregnant or trying to conceive with your partner -breast-feeding How should I use this medicine? Take this medicine by mouth with a glass of water. Follow the directions on the prescription label. You can take this medicine with or without food. Take your doses at regular intervals. Do not take your medicine more often than directed. Do not stop taking except on your doctor's advice. A special MedGuide will be given to you by the pharmacist with each prescription and refill. Be sure to read this information carefully each time. Talk to your pediatrician regarding the use of this medicine in children. Special care may be needed. Overdosage: If you think you have taken too much of this medicine contact a poison control center or emergency room at once. NOTE: This medicine is only for you. Do not share this medicine with others. What if I miss a dose? If you miss a dose, take it as soon as you can. If it is almost time for your next dose,  take only that dose. Do not take double or extra doses. What may interact with this medicine? -alcohol -certain medicines for blood pressure like captopril, enalapril, or lisinopril -certain medicines for diabetes, like pioglitazone or rosiglitazone -certain medicines for anxiety or sleep -narcotic medicines for pain This list may not describe all possible interactions. Give your health care provider a list of all the medicines, herbs, non-prescription drugs, or dietary supplements you use. Also tell them if you smoke, drink alcohol, or use illegal drugs. Some items may interact with your medicine. What should I watch for while using this medicine? Tell your doctor or healthcare professional if your symptoms do not start to get better or if they get worse. Visit your doctor or health care professional for regular checks on your progress. Do not stop taking except on your doctor's advice. You may develop a severe reaction. Your doctor will tell you how much medicine to take. Wear a medical identification bracelet or chain if you are taking this medicine for seizures, and carry a card that describes your disease and details of your medicine and dosage times. You may get drowsy or dizzy. Do not drive, use machinery, or do anything that needs mental alertness until you know how this medicine affects you. Do not stand or sit up quickly, especially if you are an older patient. This reduces the risk of dizzy or fainting spells. Alcohol may interfere with the effect of this medicine. Avoid alcoholic drinks. If you have a heart condition, like congestive heart failure, and notice that you are retaining water and have  swelling in your hands or feet, contact your health care provider immediately. The use of this medicine may increase the chance of suicidal thoughts or actions. Pay special attention to how you are responding while on this medicine. Any worsening of mood, or thoughts of suicide or dying should be  reported to your health care professional right away. This medicine has caused reduced sperm counts in some men. This may interfere with the ability to father a child. You should talk to your doctor or health care professional if you are concerned about your fertility. Women who become pregnant while using this medicine for seizures may enroll in the [place] Pregnancy Registry by calling [phone]. This registry collects information about the safety of antiepileptic drug use during pregnancy. What side effects may I notice from receiving this medicine? Side effects that you should report to your doctor or health care professional as soon as possible: -allergic reactions like skin rash, itching or hives, swelling of the face, lips, or tongue -breathing problems -changes in vision -chest pain -confusion -jerking or unusual movements of any part of your body -loss of memory -muscle pain, tenderness, or weakness -suicidal thoughts or other mood changes -swelling of the ankles, feet, hands -unusual bruising or bleeding Side effects that usually do not require medical attention (report to your doctor or health care professional if they continue or are bothersome): -dizziness -drowsiness -dry mouth -headache -nausea -tremors -trouble sleeping -weight gain This list may not describe all possible side effects. Call your doctor for medical advice about side effects. You may report side effects to FDA at 1-800-FDA-1088. Where should I keep my medicine? Keep out of the reach of children. This medicine can be abused. Keep your medicine in a safe place to protect it from theft. Do not share this medicine with anyone. Selling or giving away this medicine is dangerous and against the law. This medicine may cause accidental overdose and death if it taken by other adults, children, or pets. Mix any unused medicine with a substance like cat litter or coffee grounds. Then throw  the medicine away in a sealed container like a sealed bag or a coffee can with a lid. Do not use the medicine after the expiration date. Store at room temperature between 15 and 30 degrees C (59 and 86 degrees F). NOTE: This sheet is a summary. It may not cover all possible information. If you have questions about this medicine, talk to your doctor, pharmacist, or health care provider.  2018 Elsevier/Gold Standard (2015-08-04 10:26:12)  Neuropathic Pain Neuropathic pain is pain caused by damage to the nerves that are responsible for certain sensations in your body (sensory nerves). The pain can be caused by damage to:  The sensory nerves that send signals to your spinal cord and brain (peripheral nervous system).  The sensory nerves in your brain or spinal cord (central nervous system).  Neuropathic pain can make you more sensitive to pain. What would be a minor sensation for most people may feel very painful if you have neuropathic pain. This is usually a long-term condition that can be difficult to treat. The type of pain can differ from person to person. It may start suddenly (acute), or it may develop slowly and last for a long time (chronic). Neuropathic pain may come and go as damaged nerves heal or may stay at the same level for years. It often causes emotional distress, loss of sleep, and a lower quality of life. What are the  causes? The most common cause of damage to a sensory nerve is diabetes. Many other diseases and conditions can also cause neuropathic pain. Causes of neuropathic pain can be classified as:  Toxic. Many drugs and chemicals can cause toxic damage. The most common cause of toxic neuropathic pain is damage from drug treatment for cancer (chemotherapy).  Metabolic. This type of pain can happen when a disease causes imbalances that damage nerves. Diabetes is the most common of these diseases. Vitamin B deficiency caused by long-term alcohol abuse is another common  cause.  Traumatic. Any injury that cuts, crushes, or stretches a nerve can cause damage and pain. A common example is feeling pain after losing an arm or leg (phantom limb pain).  Compression-related. If a sensory nerve gets trapped or compressed for a long period of time, the blood supply to the nerve can be cut off.  Vascular. Many blood vessel diseases can cause neuropathic pain by decreasing blood supply and oxygen to nerves.  Autoimmune. This type of pain results from diseases in which the body's defense system mistakenly attacks sensory nerves. Examples of autoimmune diseases that can cause neuropathic pain include lupus and multiple sclerosis.  Infectious. Many types of viral infections can damage sensory nerves and cause pain. Shingles infection is a common cause of this type of pain.  Inherited. Neuropathic pain can be a symptom of many diseases that are passed down through families (genetic).  What are the signs or symptoms? The main symptom is pain. Neuropathic pain is often described as:  Burning.  Shock-like.  Stinging.  Hot or cold.  Itching.  How is this diagnosed? No single test can diagnose neuropathic pain. Your health care provider will do a physical exam and ask you about your pain. You may use a pain scale to describe how bad your pain is. You may also have tests to see if you have a high sensitivity to pain and to help find the cause and location of any sensory nerve damage. These tests may include:  Imaging studies, such as: ? X-rays. ? CT scan. ? MRI.  Nerve conduction studies to test how well nerve signals travel through your sensory nerves (electrodiagnostic testing).  Stimulating your sensory nerves through electrodes on your skin and measuring the response in your spinal cord and brain (somatosensory evoked potentials).  How is this treated? Treatment for neuropathic pain may change over time. You may need to try different treatment options or a  combination of treatments. Some options include:  Over-the-counter pain relievers.  Prescription medicines. Some medicines used to treat other conditions may also help neuropathic pain. These include medicines to: ? Control seizures (anticonvulsants). ? Relieve depression (antidepressants).  Prescription-strength pain relievers (narcotics). These are usually used when other pain relievers do not help.  Transcutaneous nerve stimulation (TENS). This uses electrical currents to block painful nerve signals. The treatment is painless.  Topical and local anesthetics. These are medicines that numb the nerves. They can be injected as a nerve block or applied to the skin.  Alternative treatments, such as: ? Acupuncture. ? Meditation. ? Massage. ? Physical therapy. ? Pain management programs. ? Counseling.  Follow these instructions at home:  Learn as much as you can about your condition.  Take medicines only as directed by your health care provider.  Work closely with all your health care providers to find what works best for you.  Have a good support system at home.  Consider joining a chronic pain support group. Contact a  health care provider if:  Your pain treatments are not helping.  You are having side effects from your medicines.  You are struggling with fatigue, mood changes, depression, or anxiety. This information is not intended to replace advice given to you by your health care provider. Make sure you discuss any questions you have with your health care provider. Document Released: 03/29/2004 Document Revised: 01/20/2016 Document Reviewed: 12/10/2013 Elsevier Interactive Patient Education  [employer].
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
## 2017-04-13 ENCOUNTER — Encounter: Payer: Self-pay | Admitting: Family Medicine

## 2017-04-15 ENCOUNTER — Other Ambulatory Visit: Payer: Self-pay | Admitting: Family Medicine

## 2017-04-17 ENCOUNTER — Encounter: Payer: Self-pay | Admitting: Family Medicine

## 2017-04-22 ENCOUNTER — Other Ambulatory Visit: Payer: Self-pay | Admitting: Family Medicine

## 2017-04-24 ENCOUNTER — Ambulatory Visit (INDEPENDENT_AMBULATORY_CARE_PROVIDER_SITE_OTHER): Payer: Self-pay | Admitting: Family Medicine

## 2017-04-24 ENCOUNTER — Encounter: Payer: Self-pay | Admitting: Family Medicine

## 2017-04-24 VITALS — BP 136/84 | HR 79 | Temp 98.3°F | Resp 16 | Ht 69.0 in | Wt 206.0 lb

## 2017-04-24 DIAGNOSIS — E039 Hypothyroidism, unspecified: Secondary | ICD-10-CM

## 2017-04-24 DIAGNOSIS — R7303 Prediabetes: Secondary | ICD-10-CM

## 2017-04-24 DIAGNOSIS — G629 Polyneuropathy, unspecified: Secondary | ICD-10-CM

## 2017-04-24 LAB — TSH: TSH: 26.16 m[IU]/L — AB

## 2017-04-24 LAB — POCT GLYCOSYLATED HEMOGLOBIN (HGB A1C): HEMOGLOBIN A1C: 5.4
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
## 2017-04-24 NOTE — Patient Instructions (Signed)
Continue Lyrica for neuropathic pain.  Practice good sleep hygiene.  Stick to a sleep schedule, even on weekends. Exercise is great, but not too late in the day Avoid alcoholic drinks before bed Avoid large meals and beverages late before bed Don't take naps after 3 pm. Keep power naps less than 1 hour.  Relax before bed.  Take a hot bath before bed.  Have a good sleeping environment. Get rid of anything in your bedroom that might distract you from sleep.  Adopt good sleeping posture.   Neuropathic Pain Neuropathic pain is pain caused by damage to the nerves that are responsible for certain sensations in your body (sensory nerves). The pain can be caused by damage to:  The sensory nerves that send signals to your spinal cord and brain (peripheral nervous system).  The sensory nerves in your brain or spinal cord (central nervous system).  Neuropathic pain can make you more sensitive to pain. What would be a minor sensation for most people may feel very painful if you have neuropathic pain. This is usually a long-term condition that can be difficult to treat. The type of pain can differ from person to person. It may start suddenly (acute), or it may develop slowly and last for a long time (chronic). Neuropathic pain may come and go as damaged nerves heal or may stay at the same level for years. It often causes emotional distress, loss of sleep, and a lower quality of life. What are the causes? The most common cause of damage to a sensory nerve is diabetes. Many other diseases and conditions can also cause neuropathic pain. Causes of neuropathic pain can be classified as:  Toxic. Many drugs and chemicals can cause toxic damage. The most common cause of toxic neuropathic pain is damage from drug treatment for cancer (chemotherapy).  Metabolic. This type of pain can happen when a disease causes imbalances that damage nerves. Diabetes is the most common of these diseases. Vitamin B deficiency  caused by long-term alcohol abuse is another common cause.  Traumatic. Any injury that cuts, crushes, or stretches a nerve can cause damage and pain. A common example is feeling pain after losing an arm or leg (phantom limb pain).  Compression-related. If a sensory nerve gets trapped or compressed for a long period of time, the blood supply to the nerve can be cut off.  Vascular. Many blood vessel diseases can cause neuropathic pain by decreasing blood supply and oxygen to nerves.  Autoimmune. This type of pain results from diseases in which the body's defense system mistakenly attacks sensory nerves. Examples of autoimmune diseases that can cause neuropathic pain include lupus and multiple sclerosis.  Infectious. Many types of viral infections can damage sensory nerves and cause pain. Shingles infection is a common cause of this type of pain.  Inherited. Neuropathic pain can be a symptom of many diseases that are passed down through families (genetic).  What are the signs or symptoms? The main symptom is pain. Neuropathic pain is often described as:  Burning.  Shock-like.  Stinging.  Hot or cold.  Itching.  How is this diagnosed? No single test can diagnose neuropathic pain. Your health care provider will do a physical exam and ask you about your pain. You may use a pain scale to describe how bad your pain is. You may also have tests to see if you have a high sensitivity to pain and to help find the cause and location of any sensory nerve damage. These tests  may include:  Imaging studies, such as: ? X-rays. ? CT scan. ? MRI.  Nerve conduction studies to test how well nerve signals travel through your sensory nerves (electrodiagnostic testing).  Stimulating your sensory nerves through electrodes on your skin and measuring the response in your spinal cord and brain (somatosensory evoked potentials).  How is this treated? Treatment for neuropathic pain may change over time. You  may need to try different treatment options or a combination of treatments. Some options include:  Over-the-counter pain relievers.  Prescription medicines. Some medicines used to treat other conditions may also help neuropathic pain. These include medicines to: ? Control seizures (anticonvulsants). ? Relieve depression (antidepressants).  Prescription-strength pain relievers (narcotics). These are usually used when other pain relievers do not help.  Transcutaneous nerve stimulation (TENS). This uses electrical currents to block painful nerve signals. The treatment is painless.  Topical and local anesthetics. These are medicines that numb the nerves. They can be injected as a nerve block or applied to the skin.  Alternative treatments, such as: ? Acupuncture. ? Meditation. ? Massage. ? Physical therapy. ? Pain management programs. ? Counseling.  Follow these instructions at home:  Learn as much as you can about your condition.  Take medicines only as directed by your health care provider.  Work closely with all your health care providers to find what works best for you.  Have a good support system at home.  Consider joining a chronic pain support group. Contact a health care provider if:  Your pain treatments are not helping.  You are having side effects from your medicines.  You are struggling with fatigue, mood changes, depression, or anxiety. This information is not intended to replace advice given to you by your health care provider. Make sure you discuss any questions you have with your health care provider. Document Released: 03/29/2004 Document Revised: 01/20/2016 Document Reviewed: 12/10/2013 Elsevier Interactive Patient Education  [employer].

## 2017-04-25 ENCOUNTER — Other Ambulatory Visit: Payer: Self-pay | Admitting: Family Medicine

## 2017-04-25 DIAGNOSIS — E039 Hypothyroidism, unspecified: Secondary | ICD-10-CM

## 2017-04-25 MED ORDER — LEVOTHYROXINE SODIUM 137 MCG PO TABS: 137.0000 ug | ORAL_TABLET | Freq: Every day | ORAL | 1 refills | Status: DC

## 2017-04-25 NOTE — Progress Notes (Signed)
Called, no answer. Left a message to call back. Thanks!  

## 2017-04-25 NOTE — Progress Notes (Signed)
Reviewed labs, TSH increased to 26. Patient's current dosage is 112 mcg, will increase to the following dosage:   Meds ordered this encounter  Medications  . levothyroxine (SYNTHROID, LEVOTHROID) 137 MCG tablet    Sig: Take 1 tablet (137 mcg total) by mouth daily before breakfast. Reported on 12/13/2015    Dispense:  56 tablet    Refill:  [place]  MSN, FNP-C Patient [facility] [address] [place], [place] [address] [phone]

## 2017-04-26 NOTE — Progress Notes (Signed)
Called, no answer. Left a message for patient to call back. Thanks!  

## 2017-04-29 NOTE — Progress Notes (Signed)
Called, both numbers. No answer. Will try later.

## 2017-05-01 ENCOUNTER — Ambulatory Visit (INDEPENDENT_AMBULATORY_CARE_PROVIDER_SITE_OTHER): Payer: Self-pay | Admitting: Internal Medicine

## 2017-05-01 VITALS — BP 124/85 | HR 73 | Temp 97.8°F | Wt 209.0 lb

## 2017-05-01 DIAGNOSIS — B20 Human immunodeficiency virus [HIV] disease: Secondary | ICD-10-CM

## 2017-05-01 DIAGNOSIS — F1020 Alcohol dependence, uncomplicated: Secondary | ICD-10-CM

## 2017-05-01 NOTE — Progress Notes (Signed)
   Subjective:    Patient ID: Cheryl Thompson, female    DOB: 26-Mar-1966, 51 y.o.   MRN: [id number]  HPI Here for follow up of HIV She continues on Prezcobix an Descovy and has had 2 or 3 missed doses since last vist.  Last viral load suppressed.  She continues to have issues with neuropathy and back pain and has a scheduled appt with neurology.    Review of Systems  Constitutional: Negative for fatigue.  Gastrointestinal: Negative for diarrhea.  Skin: Negative for rash.       Objective:   Physical Exam  Constitutional: She appears well-developed and well-nourished. No distress.  Eyes: No scleral icterus.  Cardiovascular: Normal rate, regular rhythm and normal heart sounds.   No murmur heard. Pulmonary/Chest: Effort normal and breath sounds normal. No respiratory distress.  Lymphadenopathy:    She has no cervical adenopathy.  Skin: No rash noted.          Assessment & Plan:

## 2017-05-01 NOTE — Assessment & Plan Note (Signed)
She continues to be alcohol free

## 2017-05-01 NOTE — Assessment & Plan Note (Signed)
Doing well, labs today and rtc 6 months unless concerns.

## 2017-05-01 NOTE — Progress Notes (Signed)
Called and spoke with patient. Advised of TSH increase from 4 to 26 and the need to increase thyroid medication to 179mcg once daily on a empty stomach. Appointment was scheduled for 8 weeks for labs only and patient verbalized understanding. Thanks!

## 2017-05-02 ENCOUNTER — Ambulatory Visit: Payer: Self-pay | Admitting: Family Medicine

## 2017-05-03 LAB — T-HELPER CELL (CD4) - (RCID CLINIC ONLY)
CD4 T CELL HELPER: 39 % (ref 33–55)
CD4 T Cell Abs: 1630 /uL (ref 400–2700)

## 2017-05-03 LAB — HIV-1 RNA QUANT-NO REFLEX-BLD
HIV 1 RNA Quant: 56 copies/mL — ABNORMAL HIGH
HIV-1 RNA Quant, Log: 1.75 Log copies/mL — ABNORMAL HIGH

## 2017-05-06 ENCOUNTER — Telehealth: Payer: Self-pay | Admitting: Licensed Clinical Social Worker

## 2017-05-06 NOTE — Telephone Encounter (Signed)
[facility] called and left message for patient to arrange for appointment.  Sande Rives, [facility]

## 2017-05-09 ENCOUNTER — Encounter: Payer: Self-pay | Admitting: Internal Medicine

## 2017-05-15 ENCOUNTER — Institutional Professional Consult (permissible substitution): Payer: Self-pay | Admitting: Licensed Clinical Social Worker

## 2017-05-15 ENCOUNTER — Telehealth: Payer: Self-pay | Admitting: Licensed Clinical Social Worker

## 2017-05-15 NOTE — Telephone Encounter (Signed)
[facility] called patient regarding 3 pm appointment and could not leave a message due to the voicemail system.  Sande Rives, [facility]

## 2017-05-23 ENCOUNTER — Institutional Professional Consult (permissible substitution): Payer: Self-pay | Admitting: Licensed Clinical Social Worker

## 2017-05-24 ENCOUNTER — Telehealth: Payer: Self-pay | Admitting: Licensed Clinical Social Worker

## 2017-05-24 NOTE — Telephone Encounter (Signed)
[facility] called patient about missed appointment yesterday and rescheduled for Thursday. Sande Rives, [facility]

## 2017-05-30 ENCOUNTER — Institutional Professional Consult (permissible substitution): Payer: Self-pay | Admitting: Licensed Clinical Social Worker

## 2017-05-31 ENCOUNTER — Telehealth: Payer: Self-pay | Admitting: Licensed Clinical Social Worker

## 2017-05-31 NOTE — Telephone Encounter (Signed)
[facility] called patient to inquire about missed appointment and patient rescheduled for Monday.  Cheryl Thompson, [facility]

## 2017-06-03 ENCOUNTER — Ambulatory Visit (INDEPENDENT_AMBULATORY_CARE_PROVIDER_SITE_OTHER): Payer: Self-pay | Admitting: Licensed Clinical Social Worker

## 2017-06-03 ENCOUNTER — Other Ambulatory Visit: Payer: Self-pay | Admitting: Family Medicine

## 2017-06-03 DIAGNOSIS — F4323 Adjustment disorder with mixed anxiety and depressed mood: Secondary | ICD-10-CM

## 2017-06-03 NOTE — BH Specialist Note (Signed)
Integrated Behavioral Health Initial Visit  MRN: [id number] Name: Cheryl Thompson  Number of [place] Clinician visits:: 1/6 Session Start time: 10:15 am  Session End time: 11:20 am Total time: 1 hour  Type of Service: [place] Interpretor:No. Interpretor Name and Language: N/A  SUBJECTIVE: Cheryl Thompson is a 51 y.o. female accompanied by self Patient was referred by Dr. [name] for being overwhelmed.  Patient reports the following symptoms/concerns: Patient reported feeling overwhelmed due to multiple life stressors.  Patient reported that she is currently attending school and has begun struggling with comprehension of the material since her stroke in July 2018, bringing her GPA low.  Patient is experiencing nerve and muscle pain and is managing the pain rather than taking other Narcotic medications.  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  Additionally patient's mother passed away in 2014/07/22 and she is still grieving.  Her mother's birthday is also in 2024-07-23.  Lastly patient is experiencing financial stressors due to her family roommates not being able to pay the rent and expenses.    Patient has also struggled with medication compliance for medical and pain issues, and HIV.  Patient reported that she is compliant sometime with taking her pain medications, but reported that her physician gave her the ability to change the dose of her medications.She also stated that she takes HIV medications for about 28 days a month, then skips the next 2-3 days to allow her body to reset.  Patient denied this being problematic and reported that she is undetectable.  Patient has been actively working on maintaining her abstinence by attending 12-step meetings, having a sponsor and sponsoring others, and being active in the church and ministries.  [facility] and  patient discussed the need to evaluate time resources and learn time management and other coping strategies.  Patient also verbalized that she does not know how to express anger appropriately and needs to learn to experience strong negative emotions without blowing up.  Patient also reported poor sleep due to staying up late studying for school.  Severity of problem: moderate  OBJECTIVE: Mood: Anxious and Depressed and Affect: within range Risk of harm to self or others: No plan to harm self or others  LIFE CONTEXT: Family and Social: Patient has been married and divorced twice and has supportive family/friends network. School/Work: Patient has been working for the past two years at [employer]. Self-Care: Patient is able to tend to her ADL's. Life Changes: Patient had stroke in July 2018 and is having difficulty with retaining material for school.  ASSESSMENT: Patient is currently experiencing anxiety and depression and may benefit from behavioral health services and medication management.  GOALS ADDRESSED: Patient will: 1. Reduce symptoms of: anxiety and depression 2. Increase knowledge and/or ability of: coping skills, healthy habits and stress reduction  3. Demonstrate ability to: Increase healthy adjustment to current life circumstances and Improve medication compliance  INTERVENTIONS: Interventions utilized: Supportive Counseling and Psychoeducation and/or Health Education   PLAN: 1. Follow up with behavioral health clinician on : Patient is not sure of her work schedule and will call back to schedule   Sande Rives, [facility]

## 2017-06-26 ENCOUNTER — Other Ambulatory Visit: Payer: Self-pay

## 2017-07-02 ENCOUNTER — Other Ambulatory Visit: Payer: Self-pay

## 2017-07-04 ENCOUNTER — Other Ambulatory Visit: Payer: Self-pay

## 2017-07-04 DIAGNOSIS — E039 Hypothyroidism, unspecified: Secondary | ICD-10-CM

## 2017-07-05 LAB — TSH: TSH: 14.62 u[IU]/mL — AB (ref 0.450–4.500)

## 2017-07-07 ENCOUNTER — Other Ambulatory Visit: Payer: Self-pay | Admitting: Family Medicine

## 2017-07-07 DIAGNOSIS — E039 Hypothyroidism, unspecified: Secondary | ICD-10-CM

## 2017-07-07 MED ORDER — LEVOTHYROXINE SODIUM 150 MCG PO TABS: 150.0000 ug | ORAL_TABLET | Freq: Every day | ORAL | 1 refills | Status: DC

## 2017-07-07 NOTE — Progress Notes (Signed)
Meds ordered this encounter  Medications  . levothyroxine (SYNTHROID, LEVOTHROID) 150 MCG tablet    Sig: Take 1 tablet (150 mcg total) by mouth daily before breakfast. Reported on 12/13/2015    Dispense:  56 tablet    Refill:  [place]  MSN, FNP-C Patient [facility] [address] [place], [place] [address] [phone]

## 2017-07-10 ENCOUNTER — Telehealth: Payer: Self-pay

## 2017-07-10 NOTE — Telephone Encounter (Signed)
-----   Message from [name], [place] sent at 07/07/2017  6:48 AM EST ----- Regarding: lab results Please inform Ms. Schomburg that TSH remains elevated, will increase levothyroxine to 150 mcg daily on an empty stomach. Schedule a lab appointment in 6 weeks.    Thanks

## 2017-07-10 NOTE — Telephone Encounter (Signed)
Called, no answer. Left a message for patient to call back to our office and left callback number. Thanks!

## 2017-07-11 NOTE — Telephone Encounter (Signed)
Called, no answer. Will try later. Thanks!  

## 2017-07-12 ENCOUNTER — Other Ambulatory Visit: Payer: Self-pay | Admitting: Family Medicine

## 2017-07-12 NOTE — Telephone Encounter (Signed)
Called, no answer and mailbox was full. Will try later. Thanks!

## 2017-07-17 NOTE — Telephone Encounter (Signed)
Have not been able to contact patient by phone will mail letter. Thanks!

## 2017-07-25 ENCOUNTER — Ambulatory Visit: Payer: Self-pay | Admitting: Family Medicine

## 2017-08-05 ENCOUNTER — Ambulatory Visit (INDEPENDENT_AMBULATORY_CARE_PROVIDER_SITE_OTHER): Payer: Worker's Compensation | Admitting: Family Medicine

## 2017-08-05 ENCOUNTER — Encounter: Payer: Self-pay | Admitting: Family Medicine

## 2017-08-05 DIAGNOSIS — M549 Dorsalgia, unspecified: Secondary | ICD-10-CM | POA: Diagnosis not present

## 2017-08-05 MED ORDER — PREDNISONE 10 MG PO TABS: ORAL_TABLET | ORAL | 0 refills | Status: DC

## 2017-08-05 NOTE — Patient Instructions (Signed)
You have strained your erector spinae,rhomboids, and trapezius muscles. Ok to take tylenol for baseline pain relief (1-2 extra strength tabs 3x/day) Voltaren twice a day with food for pain and inflammation. Flexeril as needed for muscle spasms (no driving on this medicine if it makes you sleepy). Stay as active as possible. Do home exercises and stretches as directed - hold each for 20-30 seconds and do each one three times.  Scapular stabilizer exercises as well as the ones for your neck and low back. Consider massage, chiropractor, physical therapy, and/or acupuncture. Physical therapy has been shown to be helpful while the others have mixed results. Follow up with me in 2 weeks.

## 2017-08-08 ENCOUNTER — Encounter: Payer: Self-pay | Admitting: Family Medicine

## 2017-08-08 DIAGNOSIS — M549 Dorsalgia, unspecified: Secondary | ICD-10-CM | POA: Insufficient documentation
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
## 2017-08-08 NOTE — Assessment & Plan Note (Signed)
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  Tylenol with voltaren, flexeril.  Shown home exercises and stretches to do daily.  Consider physical therapy.  F/u in 2 weeks.  Written for light duty for 2 weeks.

## 2017-08-19 ENCOUNTER — Ambulatory Visit: Payer: Self-pay | Admitting: Family Medicine

## 2017-08-20 ENCOUNTER — Ambulatory Visit (INDEPENDENT_AMBULATORY_CARE_PROVIDER_SITE_OTHER): Payer: Worker's Compensation | Admitting: Family Medicine

## 2017-08-20 ENCOUNTER — Encounter: Payer: Self-pay | Admitting: Family Medicine

## 2017-08-20 VITALS — BP 139/109 | HR 99 | Ht 69.0 in | Wt 214.0 lb

## 2017-08-20 DIAGNOSIS — M549 Dorsalgia, unspecified: Secondary | ICD-10-CM

## 2017-08-20 MED ORDER — MELOXICAM 7.5 MG PO TABS: 7.5000 mg | ORAL_TABLET | Freq: Every day | ORAL | 2 refills | Status: DC

## 2017-08-20 NOTE — Patient Instructions (Signed)
You have strained your erector spinae, rhomboids, and trapezius muscles. Ok to take tylenol for baseline pain relief (1-2 extra strength tabs 3x/day) Continue your lyrica, cymbalta as you have been. Try meloxicam instead of the ibuprofen/voltaren - 7.5mg  daily with food for pain and inflammation only as needed - it was that high dose anti-inflammatories can interact with your descovy so take this only if needed and not regularly. Flexeril as needed for muscle spasms (no driving on this medicine if it makes you sleepy). Stay as active as possible. Continue your home exercises. Start physical therapy. Follow up with me in 1 month.

## 2017-08-21 ENCOUNTER — Encounter: Payer: Self-pay | Admitting: Family Medicine
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
## 2017-08-21 NOTE — Assessment & Plan Note (Signed)
consistent with muscle strains of erector spinae, rhomboids, trapezius muscles.  Some of symptoms and exam are non-anatomic (radiating through entire left side, numbness in face, numbness entire hand) and would not correspond anatomically to a dermatome or peripheral nerve distribution.  Will try low dose meloxicam instead of her ibuprofen.  Continue lyrica, cymbalta.  Flexeril if needed for spasms.  Start physical therapy, continue home exercises.  F/u in 1 month.

## 2017-08-22 ENCOUNTER — Ambulatory Visit: Payer: Self-pay | Admitting: Family Medicine

## 2017-08-26 ENCOUNTER — Ambulatory Visit: Payer: Self-pay | Admitting: Family Medicine

## 2017-09-17 ENCOUNTER — Encounter: Payer: Worker's Compensation | Admitting: Family Medicine

## 2017-09-26 ENCOUNTER — Ambulatory Visit (INDEPENDENT_AMBULATORY_CARE_PROVIDER_SITE_OTHER): Payer: Worker's Compensation | Admitting: Family Medicine

## 2017-09-26 ENCOUNTER — Encounter: Payer: Self-pay | Admitting: Family Medicine

## 2017-09-26 DIAGNOSIS — M549 Dorsalgia, unspecified: Secondary | ICD-10-CM

## 2017-09-26 NOTE — Patient Instructions (Signed)
You're doing great! Take the meloxicam only if needed now. Cyclobenzaprine also only if needed. Finish the last few visits of physical therapy. Follow up as needed.

## 2017-09-27 ENCOUNTER — Other Ambulatory Visit: Payer: Self-pay | Admitting: Family Medicine

## 2017-09-27 ENCOUNTER — Ambulatory Visit (INDEPENDENT_AMBULATORY_CARE_PROVIDER_SITE_OTHER): Payer: Worker's Compensation | Admitting: Family Medicine

## 2017-09-27 ENCOUNTER — Encounter: Payer: Self-pay | Admitting: Family Medicine

## 2017-09-27 VITALS — BP 138/86 | HR 78 | Temp 98.2°F | Resp 16 | Ht 69.0 in | Wt 216.0 lb

## 2017-09-27 DIAGNOSIS — E039 Hypothyroidism, unspecified: Secondary | ICD-10-CM

## 2017-09-27 DIAGNOSIS — R3915 Urgency of urination: Secondary | ICD-10-CM

## 2017-09-27 DIAGNOSIS — K0889 Other specified disorders of teeth and supporting structures: Secondary | ICD-10-CM

## 2017-09-27 DIAGNOSIS — R82998 Other abnormal findings in urine: Secondary | ICD-10-CM

## 2017-09-27 DIAGNOSIS — I10 Essential (primary) hypertension: Secondary | ICD-10-CM

## 2017-09-27 DIAGNOSIS — R220 Localized swelling, mass and lump, head: Secondary | ICD-10-CM

## 2017-09-27 DIAGNOSIS — R7303 Prediabetes: Secondary | ICD-10-CM

## 2017-09-27 DIAGNOSIS — R3589 Other polyuria: Secondary | ICD-10-CM

## 2017-09-27 DIAGNOSIS — R358 Other polyuria: Secondary | ICD-10-CM

## 2017-09-27 LAB — POCT URINALYSIS DIP (DEVICE)
BILIRUBIN URINE: NEGATIVE
GLUCOSE, UA: NEGATIVE mg/dL
KETONES UR: NEGATIVE mg/dL
NITRITE: NEGATIVE
Protein, ur: NEGATIVE mg/dL
Specific Gravity, Urine: 1.02 (ref 1.005–1.030)
Urobilinogen, UA: 1 mg/dL (ref 0.0–1.0)
pH: 6.5 (ref 5.0–8.0)

## 2017-09-27 LAB — POCT GLYCOSYLATED HEMOGLOBIN (HGB A1C): HEMOGLOBIN A1C: 5.9

## 2017-09-27 MED ORDER — IBUPROFEN 800 MG PO TABS: ORAL_TABLET | ORAL | 0 refills | Status: DC

## 2017-09-27 MED ORDER — CLINDAMYCIN HCL 150 MG PO CAPS: 150.0000 mg | ORAL_CAPSULE | Freq: Three times a day (TID) | ORAL | 0 refills | Status: DC
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
## 2017-09-27 NOTE — Patient Instructions (Signed)
I suspect that you have a left dental abscess. Will start Clindamycin 150 mg three times daily for 7 days. Also, Ibuprofen 800 mg every 8 hours with food for mild to moderate tooth pain.  Apply cool compresses to left face to assist with swelling.    Increase water intake to 4-5 bottles per day.    Hemoglobin a1C is 5.9, which is consistent with prediabetes. Recommend a lowfat, low carbohydrate diet divided over 5-6 small meals, increase water intake to 6-8 glasses, and 150 minutes per week of cardiovascular exercise.   Dental Pain Dental pain may be caused by many things, including:  Tooth decay (cavities or caries). Cavities cause the nerve of your tooth to be open to air and hot or cold temperatures. This can cause pain or discomfort.  Abscess or infection. A dental abscess is an area that is full of infected pus from a bacterial infection in the inner part of the tooth (pulp). It usually happens at the end of the tooth's root.  Injury.  An unknown reason (idiopathic).  Your pain may be mild or severe. It may only happen when:  You are chewing.  You are exposed to hot or cold temperature.  You are eating or drinking sugary foods or beverages, such as: ? Soda. ? Candy.  Your pain may also be there all of the time. Follow these instructions at home: Watch your dental pain for any changes. Do these things to lessen your discomfort:  Take medicines only as told by your dentist.  If your dentist tells you to take an antibiotic medicine, finish all of it even if you start to feel better.  Keep all follow-up visits as told by your dentist. This is important.  Do not apply heat to the outside of your face.  Rinse your mouth or gargle with salt water if told by your dentist. This helps with pain and swelling. ? You can make salt water by adding  tsp of salt to 1 cup of warm water.  Apply ice to the painful area of your face: ? Put ice in a plastic bag. ? Place a towel  between your skin and the bag. ? Leave the ice on for 20 minutes, 2-3 times per day.  Avoid foods or drinks that cause you pain, such as: ? Very hot or very cold foods or drinks. ? Sweet or sugary foods or drinks.  Contact a doctor if:  Your pain is not helped with medicines.  Your symptoms are worse.  You have new symptoms. Get help right away if:  You cannot open your mouth.  You are having trouble breathing or swallowing.  You have a fever.  Your face, neck, or jaw is puffy (swollen). This information is not intended to replace advice given to you by your health care provider. Make sure you discuss any questions you have with your health care provider. Document Released: 12/19/2007 Document Revised: 12/08/2015 Document Reviewed: 06/28/2014 Elsevier Interactive Patient Education  [employer].
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
## 2017-09-27 NOTE — Assessment & Plan Note (Signed)
much improved with PT, home exercises, mobic, and flexeril as needed.  Finish up last 3-4 visits of PT.  Meloxicam and flexeril only if needed now.  F/u prn.

## 2017-09-28 ENCOUNTER — Other Ambulatory Visit: Payer: Self-pay | Admitting: Family Medicine

## 2017-09-28 LAB — BASIC METABOLIC PANEL
BUN/Creatinine Ratio: 12 (ref 9–23)
BUN: 11 mg/dL (ref 6–24)
CALCIUM: 9.6 mg/dL (ref 8.7–10.2)
CHLORIDE: 103 mmol/L (ref 96–106)
CO2: 22 mmol/L (ref 20–29)
Creatinine, Ser: 0.91 mg/dL (ref 0.57–1.00)
GFR calc Af Amer: 84 mL/min/{1.73_m2} (ref >59)
GFR calc non Af Amer: 73 mL/min/{1.73_m2} (ref >59)
GLUCOSE: 87 mg/dL (ref 65–99)
POTASSIUM: 4.3 mmol/L (ref 3.5–5.2)
SODIUM: 141 mmol/L (ref 134–144)

## 2017-09-28 LAB — TSH: TSH: 11.39 u[IU]/mL — AB (ref 0.450–4.500)

## 2017-09-29 LAB — URINE CULTURE

## 2017-09-30 ENCOUNTER — Other Ambulatory Visit: Payer: Self-pay | Admitting: Family Medicine

## 2017-09-30 ENCOUNTER — Telehealth: Payer: Self-pay

## 2017-09-30 NOTE — Telephone Encounter (Signed)
Patient called saying that she took the clindamycin but it was "too strong for me" and says it made her itch and her throat started to "close up". She has stopped medication and wants to know if you can send in a different antibiotic? Please advise. Thanks!

## 2017-10-01 ENCOUNTER — Other Ambulatory Visit: Payer: Self-pay | Admitting: Family Medicine

## 2017-10-01 ENCOUNTER — Telehealth: Payer: Self-pay | Admitting: Family Medicine

## 2017-10-01 DIAGNOSIS — K0889 Other specified disorders of teeth and supporting structures: Secondary | ICD-10-CM

## 2017-10-01 DIAGNOSIS — K0510 Chronic gingivitis, plaque induced: Secondary | ICD-10-CM

## 2017-10-01 DIAGNOSIS — R220 Localized swelling, mass and lump, head: Secondary | ICD-10-CM

## 2017-10-01 MED ORDER — SULFAMETHOXAZOLE-TRIMETHOPRIM 800-160 MG PO TABS: 1.0000 | ORAL_TABLET | Freq: Two times a day (BID) | ORAL | 0 refills | Status: AC

## 2017-10-01 NOTE — Progress Notes (Signed)
Meds ordered this encounter  Medications  . sulfamethoxazole-trimethoprim (BACTRIM DS,SEPTRA DS) 800-160 MG tablet    Sig: Take 1 tablet by mouth 2 (two) times daily for 7 days.    Dispense:  14 tablet    Refill:  0    [name]  MSN, FNP-C Patient [facility] [address] [place], [place] [address] [phone]

## 2017-10-01 NOTE — Telephone Encounter (Signed)
Cheryl Thompson, a 52 year old female with a history of HIV, hypertension, hypothyroidism presented on 09/27/2017 complaining of dental pain and possible dental abscess.  Patient was also found to have gum inflammation and right facial swelling.  She was started on clindamycin 150 mg 3 times daily for 7 days.  Patient was unable to tolerate medication due to throat swelling.  Patient was advised to stop medication immediately.  Started Bactrim 800-1 60 twice daily for 7 days.   The patient was given clear instructions to go to ER or return to medical center if symptoms do not improve, worsen or new problems develop.    Left message, will attempt to reach patient at a later time.    [name]  MSN, FNP-C Patient [facility] [address] [place], [place] [address] [phone]

## 2017-10-02 ENCOUNTER — Telehealth: Payer: Self-pay

## 2017-10-02 NOTE — Telephone Encounter (Signed)
Called and left a message that tsh was slightly elevated and that it is very important to take levothyroxine consistently on an empty stomach. Advised that we will need to recheck TSH in 8 weeks and asked that she call and schedule an appointment. Advised that all other labs are at baseline and left callback number for her to reach [place]. Thanks!

## 2017-10-02 NOTE — Telephone Encounter (Signed)
-----   Message from [name], [place] sent at 09/28/2017 11:57 AM EDT ----- Regarding: lab results Reviewed labs TSH slightly elevated. Please remind patient of the importance of taking levothyroxine consistently on an empty stomach. Will recheck TSH in 8 weeks. All other labs consistent with baseline.    Thanks

## 2017-10-17 ENCOUNTER — Ambulatory Visit: Payer: Self-pay | Admitting: Internal Medicine

## 2017-11-06 ENCOUNTER — Encounter: Payer: Self-pay | Admitting: Family Medicine

## 2017-11-06 ENCOUNTER — Telehealth: Payer: Self-pay

## 2017-11-06 NOTE — Telephone Encounter (Signed)
[place], Please advise if you can do this?

## 2017-11-19 ENCOUNTER — Ambulatory Visit: Payer: Self-pay | Admitting: Internal Medicine

## 2017-11-28 ENCOUNTER — Ambulatory Visit (INDEPENDENT_AMBULATORY_CARE_PROVIDER_SITE_OTHER): Payer: Self-pay | Admitting: Internal Medicine

## 2017-11-28 ENCOUNTER — Encounter: Payer: Self-pay | Admitting: Internal Medicine

## 2017-11-28 VITALS — BP 114/81 | HR 79 | Temp 98.2°F | Ht 69.0 in | Wt 215.0 lb

## 2017-11-28 DIAGNOSIS — B20 Human immunodeficiency virus [HIV] disease: Secondary | ICD-10-CM

## 2017-11-28 DIAGNOSIS — J069 Acute upper respiratory infection, unspecified: Secondary | ICD-10-CM

## 2017-11-28 DIAGNOSIS — F1020 Alcohol dependence, uncomplicated: Secondary | ICD-10-CM

## 2017-11-28 DIAGNOSIS — B182 Chronic viral hepatitis C: Secondary | ICD-10-CM

## 2017-11-28 MED ORDER — ALBUTEROL SULFATE HFA 108 (90 BASE) MCG/ACT IN AERS
2.0000 | INHALATION_SPRAY | Freq: Four times a day (QID) | RESPIRATORY_TRACT | 1 refills | Status: DC | PRN
Start: 2017-11-28 — End: 2018-06-23

## 2017-11-28 MED ORDER — BICTEGRAVIR-EMTRICITAB-TENOFOV 50-200-25 MG PO TABS: 1.0000 | ORAL_TABLET | Freq: Every day | ORAL | 6 refills | Status: DC

## 2017-11-28 MED ORDER — PHENYLEPHRINE-DM-GG 5-10-100 MG/5ML PO LIQD: 5.0000 mL | Freq: Two times a day (BID) | ORAL | 0 refills | Status: DC | PRN

## 2017-11-28 MED ORDER — BICTEGRAVIR-EMTRICITAB-TENOFOV 50-200-25 MG PO TABS: 1.0000 | ORAL_TABLET | Freq: Every day | ORAL | 2 refills | Status: DC

## 2017-11-28 MED FILL — BIKTARVY 50-200-25 MG TABS: 50-200-25 | 30 days supply | Qty: 30 | Fill #0

## 2017-11-28 NOTE — Assessment & Plan Note (Signed)
Active never treated.  I will get her labs and elastography and have her come back in August and get her on treatment.   rtc 3 months for hepatitits C treatment.

## 2017-11-28 NOTE — Progress Notes (Signed)
   Subjective:    Patient ID: Cheryl Thompson, female    DOB: 1965/08/09, 52 y.o.   MRN: [id number]  HPI Here for follow up of HIV Has been on Prezcobix and Descovy and denies any missed doses.  Unfortunately she is about to run out and getting set up with [facility].  No new issues with the medication otherwise and no assocaited n/v/d.  Also with hepatitis C and not yet treated.  Some URI symptoms now.     Review of Systems  Constitutional: Negative for chills, fatigue and fever.  Respiratory: Positive for cough and wheezing. Negative for shortness of breath.   Skin: Negative for rash.       Objective:   Physical Exam  Constitutional: She appears well-developed and well-nourished. No distress.  HENT:  Mouth/Throat: No oropharyngeal exudate.  Eyes: No scleral icterus.  Cardiovascular: Normal rate, regular rhythm and normal heart sounds.  No murmur heard. Pulmonary/Chest: Effort normal. No respiratory distress. She has wheezes.  Lymphadenopathy:    She has no cervical adenopathy.  Skin: No rash noted.   SH: remains drug-free       Assessment & Plan:
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
## 2017-11-28 NOTE — Assessment & Plan Note (Signed)
I sent in an albuterol inhaler and cough medicine.

## 2017-11-28 NOTE — Assessment & Plan Note (Signed)
Doing well with taking it but about to run out so will get her a supply of Biktarvy and change her to [employer].   Labs today and repeat in 6 months.

## 2017-11-29 LAB — COMPLETE METABOLIC PANEL WITH GFR
AG RATIO: 0.9 (calc) — AB (ref 1.0–2.5)
ALBUMIN MSPROF: 3.9 g/dL (ref 3.6–5.1)
ALKALINE PHOSPHATASE (APISO): 67 U/L (ref 33–130)
ALT: 38 U/L — ABNORMAL HIGH (ref 6–29)
AST: 54 U/L — ABNORMAL HIGH (ref 10–35)
BILIRUBIN TOTAL: 0.4 mg/dL (ref 0.2–1.2)
BUN: 17 mg/dL (ref 7–25)
CHLORIDE: 100 mmol/L (ref 98–110)
CO2: 28 mmol/L (ref 20–32)
Calcium: 9.8 mg/dL (ref 8.6–10.4)
Creat: 0.98 mg/dL (ref 0.50–1.05)
GFR, EST AFRICAN AMERICAN: 77 mL/min/{1.73_m2} (ref >=60)
GFR, Est Non African American: 66 mL/min/{1.73_m2} (ref >=60)
GLUCOSE: 88 mg/dL (ref 65–99)
Globulin: 4.4 g/dL (calc) — ABNORMAL HIGH (ref 1.9–3.7)
POTASSIUM: 3.6 mmol/L (ref 3.5–5.3)
SODIUM: 139 mmol/L (ref 135–146)
Total Protein: 8.3 g/dL — ABNORMAL HIGH (ref 6.1–8.1)

## 2017-11-29 LAB — CBC WITH DIFFERENTIAL/PLATELET
BASOS PCT: 0.4 %
Basophils Absolute: 29 cells/uL (ref 0–200)
EOS PCT: 2.7 %
Eosinophils Absolute: 197 cells/uL (ref 15–500)
HCT: 39.2 % (ref 35.0–45.0)
HEMOGLOBIN: 13.4 g/dL (ref 11.7–15.5)
LYMPHS ABS: 3212 {cells}/uL (ref 850–3900)
MCH: 30.7 pg (ref 27.0–33.0)
MCHC: 34.2 g/dL (ref 32.0–36.0)
MCV: 89.9 fL (ref 80.0–100.0)
MPV: 14 fL — AB (ref 7.5–12.5)
Monocytes Relative: 8 %
NEUTROS ABS: 3278 {cells}/uL (ref 1500–7800)
NEUTROS PCT: 44.9 %
Platelets: 131 10*3/uL — ABNORMAL LOW (ref 140–400)
RBC: 4.36 10*6/uL (ref 3.80–5.10)
RDW: 12.7 % (ref 11.0–15.0)
Total Lymphocyte: 44 %
WBC: 7.3 10*3/uL (ref 3.8–10.8)
WBCMIX: 584 {cells}/uL (ref 200–950)

## 2017-11-29 LAB — PROTIME-INR
INR: 1
PROTHROMBIN TIME: 10.4 s (ref 9.0–11.5)

## 2017-11-29 LAB — T-HELPER CELL (CD4) - (RCID CLINIC ONLY)
CD4 T CELL ABS: 1090 /uL (ref 400–2700)
CD4 T CELL HELPER: 32 % — AB (ref 33–55)

## 2017-11-29 LAB — HEPATITIS B SURFACE ANTIGEN: Hepatitis B Surface Ag: NONREACTIVE

## 2017-11-29 NOTE — Assessment & Plan Note (Signed)
Remains alcohol free.  

## 2017-12-03 MED ORDER — SODIUM CHLORIDE 0.9 % IV SOLN
10.00 | INTRAVENOUS | Status: DC
Start: ? — End: 2017-12-03

## 2017-12-03 MED ORDER — GENERIC EXTERNAL MEDICATION
Status: DC
Start: ? — End: 2017-12-03

## 2017-12-04 ENCOUNTER — Ambulatory Visit (HOSPITAL_COMMUNITY): Payer: Self-pay

## 2017-12-04 LAB — HEPATITIS C GENOTYPE

## 2017-12-04 LAB — HEPATITIS C RNA QUANTITATIVE
HCV Quantitative Log: 6.25 Log IU/mL — ABNORMAL HIGH
HCV RNA, PCR, QN: 1770000 [IU]/mL — AB

## 2017-12-05 ENCOUNTER — Encounter: Payer: Self-pay | Admitting: Family Medicine

## 2017-12-05 ENCOUNTER — Ambulatory Visit (INDEPENDENT_AMBULATORY_CARE_PROVIDER_SITE_OTHER): Payer: Self-pay | Admitting: Family Medicine

## 2017-12-05 VITALS — BP 134/83 | HR 75 | Temp 98.3°F | Resp 16 | Ht 69.0 in | Wt 217.0 lb

## 2017-12-05 DIAGNOSIS — J069 Acute upper respiratory infection, unspecified: Secondary | ICD-10-CM

## 2017-12-05 DIAGNOSIS — R053 Chronic cough: Secondary | ICD-10-CM

## 2017-12-05 DIAGNOSIS — R062 Wheezing: Secondary | ICD-10-CM

## 2017-12-05 DIAGNOSIS — R05 Cough: Secondary | ICD-10-CM

## 2017-12-05 DIAGNOSIS — E876 Hypokalemia: Secondary | ICD-10-CM

## 2017-12-05 LAB — HIV-1 RNA QUANT-NO REFLEX-BLD
HIV 1 RNA Quant: 29 copies/mL — ABNORMAL HIGH
HIV-1 RNA QUANT, LOG: 1.46 {Log_copies}/mL — AB

## 2017-12-05 MED ORDER — ALBUTEROL SULFATE (2.5 MG/3ML) 0.083% IN NEBU
2.5000 mg | INHALATION_SOLUTION | Freq: Once | RESPIRATORY_TRACT | Status: AC
  Administered 2017-12-05: 2.5 mg via RESPIRATORY_TRACT

## 2017-12-05 MED ORDER — IPRATROPIUM BROMIDE 0.02 % IN SOLN
0.5000 mg | Freq: Once | RESPIRATORY_TRACT | Status: AC
  Administered 2017-12-05: 0.5 mg via RESPIRATORY_TRACT

## 2017-12-05 MED ORDER — GENERIC EXTERNAL MEDICATION
Status: DC
Start: ? — End: 2017-12-05

## 2017-12-05 MED ORDER — AZITHROMYCIN 250 MG PO TABS: ORAL_TABLET | ORAL | 0 refills | Status: DC
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
## 2017-12-05 NOTE — Patient Instructions (Signed)
I suspect that you have an upper respiratory infection. Will start Azithromycin 500 mg on day 1 and 250 mg on days 2-5.  Continue steroid taper as prescribed in the ER Albuterol inhaler 2 puffs every 6 hours as needed for cough, wheezing, or shortness of breath.   The patient was given clear instructions to go to ER or return to medical center if symptoms do not improve, worsen or new problems develop. The patient verbalized understanding. Will notify patient with laboratory results. Upper Respiratory Infection, Adult Most upper respiratory infections (URIs) are caused by a virus. A URI affects the nose, throat, and upper air passages. The most common type of URI is often called "the common cold." Follow these instructions at home:  Take medicines only as told by your doctor.  Gargle warm saltwater or take cough drops to comfort your throat as told by your doctor.  Use a warm mist humidifier or inhale steam from a shower to increase air moisture. This may make it easier to breathe.  Drink enough fluid to keep your pee (urine) clear or pale yellow.  Eat soups and other clear broths.  Have a healthy diet.  Rest as needed.  Go back to work when your fever is gone or your doctor says it is okay. ? You may need to stay home longer to avoid giving your URI to others. ? You can also wear a face mask and wash your hands often to prevent spread of the virus.  Use your inhaler more if you have asthma.  Do not use any tobacco products, including cigarettes, chewing tobacco, or electronic cigarettes. If you need help quitting, ask your doctor. Contact a doctor if:  You are getting worse, not better.  Your symptoms are not helped by medicine.  You have chills.  You are getting more short of breath.  You have brown or red mucus.  You have yellow or brown discharge from your nose.  You have pain in your face, especially when you bend forward.  You have a fever.  You have puffy  (swollen) neck glands.  You have pain while swallowing.  You have white areas in the back of your throat. Get help right away if:  You have very bad or constant: ? Headache. ? Ear pain. ? Pain in your forehead, behind your eyes, and over your cheekbones (sinus pain). ? Chest pain.  You have long-lasting (chronic) lung disease and any of the following: ? Wheezing. ? Long-lasting cough. ? Coughing up blood. ? A change in your usual mucus.  You have a stiff neck.  You have changes in your: ? Vision. ? Hearing. ? Thinking. ? Mood. This information is not intended to replace advice given to you by your health care provider. Make sure you discuss any questions you have with your health care provider. Document Released: 12/19/2007 Document Revised: 03/04/2016 Document Reviewed: 10/07/2013 Elsevier Interactive Patient Education  2018 [employer].

## 2017-12-06 ENCOUNTER — Encounter: Payer: Self-pay | Admitting: Internal Medicine

## 2017-12-06 ENCOUNTER — Other Ambulatory Visit: Payer: Self-pay | Admitting: Family Medicine

## 2017-12-06 ENCOUNTER — Telehealth: Payer: Self-pay

## 2017-12-06 DIAGNOSIS — E876 Hypokalemia: Secondary | ICD-10-CM

## 2017-12-06 LAB — BASIC METABOLIC PANEL
BUN/Creatinine Ratio: 15 (ref 9–23)
BUN: 14 mg/dL (ref 6–24)
CO2: 25 mmol/L (ref 20–29)
CREATININE: 0.94 mg/dL (ref 0.57–1.00)
Calcium: 9.3 mg/dL (ref 8.7–10.2)
Chloride: 105 mmol/L (ref 96–106)
GFR calc Af Amer: 81 mL/min/{1.73_m2} (ref >59)
GFR calc non Af Amer: 70 mL/min/{1.73_m2} (ref >59)
GLUCOSE: 98 mg/dL (ref 65–99)
Potassium: 3.2 mmol/L — ABNORMAL LOW (ref 3.5–5.2)
SODIUM: 144 mmol/L (ref 134–144)

## 2017-12-06 LAB — LIVER FIBROSIS, FIBROTEST-ACTITEST
ALT: 40 U/L — AB (ref 6–29)
APOLIPOPROTEIN A1: 113 mg/dL — AB
Alpha-2-Macroglobulin: 451 mg/dL — ABNORMAL HIGH (ref 106–279)
BILIRUBIN: 0.2 mg/dL (ref 0.2–1.2)
Fibrosis Score: 0.46
GGT: 40 U/L (ref 3–70)
HAPTOGLOBIN: 149 mg/dL (ref 43–212)
Necroinflammat ACT Score: 0.26
Reference ID: 2477198

## 2017-12-06 MED ORDER — POTASSIUM CHLORIDE ER 10 MEQ PO TBCR: 10.0000 meq | EXTENDED_RELEASE_TABLET | Freq: Every day | ORAL | 0 refills | Status: DC

## 2017-12-06 NOTE — Telephone Encounter (Signed)
Called no answer, left a message that potassium is decreased and that we are starting her on potassium for 10 days to take as directed and to call back to our office on Tuesday to scheduled an appointment for labs in 2 weeks. Thanks!

## 2017-12-06 NOTE — Progress Notes (Signed)
Meds ordered this encounter  Medications  . potassium chloride (K-DUR) 10 MEQ tablet    Sig: Take 1 tablet (10 mEq total) by mouth daily.    Dispense:  10 tablet    Refill:  0  . Orders Placed This Encounter  Procedures  . Potassium    Standing Status:   Future    Standing Expiration Date:   12/07/2018     [name]  MSN, FNP-C [facility] [address] [place], [place] [address] [phone]

## 2017-12-06 NOTE — Telephone Encounter (Signed)
-----   Message from [name], [place] [facility] sent at 12/06/2017  4:18 PM EDT ----- Regarding: lab results Please inform patient that potassium is decreased. Sent Kdur (potassium replacement) in for 10 days.   Will recheck in 2 weeks.   [name]  MSN, FNP-C [facility] [address] [place], [place] [address] [phone]

## 2017-12-20 ENCOUNTER — Other Ambulatory Visit: Payer: Self-pay | Admitting: Pharmacist

## 2017-12-20 NOTE — Progress Notes (Signed)
Patient doing well on Biktarvy. No side effects or missed doses that she recalls. Her ADAP was approved on 6/4.

## 2017-12-25 ENCOUNTER — Telehealth: Payer: Self-pay

## 2017-12-25 DIAGNOSIS — I10 Essential (primary) hypertension: Secondary | ICD-10-CM

## 2017-12-25 MED ORDER — HYDROCHLOROTHIAZIDE 25 MG PO TABS: 25.0000 mg | ORAL_TABLET | Freq: Every day | ORAL | 5 refills | Status: DC

## 2017-12-25 NOTE — Telephone Encounter (Signed)
Refill of hctz sent into pharmacy. Thanks!

## 2018-01-07 ENCOUNTER — Other Ambulatory Visit: Payer: Self-pay | Admitting: Pharmacist Clinician (PhC)/ Clinical Pharmacy Specialist

## 2018-01-11 DIAGNOSIS — R200 Anesthesia of skin: Secondary | ICD-10-CM | POA: Insufficient documentation

## 2018-02-13 ENCOUNTER — Other Ambulatory Visit: Payer: Self-pay

## 2018-02-13 DIAGNOSIS — G629 Polyneuropathy, unspecified: Secondary | ICD-10-CM

## 2018-02-13 NOTE — Telephone Encounter (Signed)
Spoke with program and they will pay for medications.

## 2018-02-16 MED ORDER — LEVETIRACETAM 500 MG PO TABS: ORAL_TABLET | ORAL | 0 refills | Status: DC

## 2018-02-16 MED ORDER — PREGABALIN 25 MG PO CAPS: ORAL_CAPSULE | ORAL | 0 refills | Status: DC

## 2018-02-25 ENCOUNTER — Encounter: Payer: Self-pay | Admitting: Family Medicine

## 2018-02-25 ENCOUNTER — Ambulatory Visit (INDEPENDENT_AMBULATORY_CARE_PROVIDER_SITE_OTHER): Payer: Self-pay | Admitting: Family Medicine

## 2018-02-25 VITALS — BP 135/92 | HR 81 | Ht 69.0 in | Wt 210.0 lb

## 2018-02-25 DIAGNOSIS — S99921A Unspecified injury of right foot, initial encounter: Secondary | ICD-10-CM

## 2018-02-25 NOTE — Patient Instructions (Signed)
You have a 4th proximal phalanx fracture of your foot. This has about 2 more weeks to heal. Wear the boot when up and walking around. Icing if needed (and if tolerated) 15 minutes at a time 3-4 times a day. Elevate above your heart when possible. Vitamin C 500mg  daily - make sure you're taking this in your vitamins. Follow up with me in 2 weeks.

## 2018-02-26 ENCOUNTER — Telehealth: Payer: Self-pay

## 2018-02-26 ENCOUNTER — Encounter: Payer: Self-pay | Admitting: Family Medicine

## 2018-02-26 ENCOUNTER — Other Ambulatory Visit: Payer: Self-pay

## 2018-02-26 ENCOUNTER — Ambulatory Visit: Payer: Self-pay | Admitting: Family Medicine

## 2018-02-26 MED ORDER — LEVETIRACETAM 500 MG PO TABS: ORAL_TABLET | ORAL | 0 refills | Status: DC

## 2018-02-26 NOTE — Telephone Encounter (Signed)
Called and spoke with patient. Advised that rx for 30 days was sent in on 02/16/2018. Thanks!

## 2018-02-26 NOTE — Telephone Encounter (Signed)
120 pills sent to the pharmacy [facility] on [address] on 02/16/2018.
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
## 2018-02-26 NOTE — Telephone Encounter (Signed)
Patient is requesting a refill on Lyrica. Please advise. Thanks!

## 2018-03-05 ENCOUNTER — Ambulatory Visit (INDEPENDENT_AMBULATORY_CARE_PROVIDER_SITE_OTHER): Payer: Self-pay | Admitting: Internal Medicine

## 2018-03-05 ENCOUNTER — Encounter: Payer: Self-pay | Admitting: Internal Medicine

## 2018-03-05 VITALS — BP 133/89 | HR 99 | Wt 210.0 lb

## 2018-03-05 DIAGNOSIS — B182 Chronic viral hepatitis C: Secondary | ICD-10-CM

## 2018-03-05 DIAGNOSIS — B20 Human immunodeficiency virus [HIV] disease: Secondary | ICD-10-CM

## 2018-03-05 DIAGNOSIS — Z23 Encounter for immunization: Secondary | ICD-10-CM

## 2018-03-06 NOTE — Assessment & Plan Note (Signed)
No missed doses.  Will follow up in 3 months

## 2018-03-06 NOTE — Progress Notes (Signed)
   Subjective:    Patient ID: Cheryl Thompson, female    DOB: [birth year], 52 y.o.   MRN: [id number]  HPI Here for follow up of chronic hepatitis C Had labs but not yet the elastography.  She is working on getting the [facility] card first.  She has genotype 1b, active transaminitis. Remains drug and alcohol free.     Review of Systems  Constitutional: Negative for fatigue.       Objective:   Physical Exam  Constitutional: She appears well-developed and well-nourished. No distress.  HENT:  Mouth/Throat: No oropharyngeal exudate.  Eyes: No scleral icterus.  Cardiovascular: Normal rate, regular rhythm and normal heart sounds.  No murmur heard. Skin: No rash noted.          Assessment & Plan:

## 2018-03-06 NOTE — Assessment & Plan Note (Signed)
Will wait for elastogrpahy and ultrasound.  Once done will get her treament via [facility]  She will call after she gets it and I will get her on treatment then

## 2018-03-11 ENCOUNTER — Ambulatory Visit (INDEPENDENT_AMBULATORY_CARE_PROVIDER_SITE_OTHER): Payer: BLUE CROSS/BLUE SHIELD | Admitting: Family Medicine

## 2018-03-11 ENCOUNTER — Encounter: Payer: Self-pay | Admitting: Family Medicine

## 2018-03-11 VITALS — BP 150/100 | HR 84 | Ht 69.0 in | Wt 210.0 lb

## 2018-03-11 DIAGNOSIS — S99921D Unspecified injury of right foot, subsequent encounter: Secondary | ICD-10-CM | POA: Diagnosis not present

## 2018-03-11 NOTE — Patient Instructions (Signed)
Your fracture has healed. I'd continue with the Vitamin C for 4 more weeks. Stop using the boot. Ok to return to work.  Follow up with me as needed.

## 2018-03-12 ENCOUNTER — Encounter: Payer: Self-pay | Admitting: Family Medicine
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
## 2018-03-27 ENCOUNTER — Encounter: Payer: Self-pay | Admitting: Internal Medicine

## 2018-03-31 ENCOUNTER — Ambulatory Visit: Payer: Self-pay | Admitting: Family Medicine

## 2018-04-01 ENCOUNTER — Other Ambulatory Visit: Payer: Self-pay | Admitting: Family Medicine

## 2018-04-09 ENCOUNTER — Encounter: Payer: Self-pay | Admitting: Family Medicine

## 2018-04-09 ENCOUNTER — Other Ambulatory Visit: Payer: Self-pay

## 2018-04-09 ENCOUNTER — Ambulatory Visit (INDEPENDENT_AMBULATORY_CARE_PROVIDER_SITE_OTHER): Payer: BLUE CROSS/BLUE SHIELD | Admitting: Family Medicine

## 2018-04-09 VITALS — BP 126/84 | HR 75 | Temp 97.9°F | Resp 16 | Ht 69.0 in | Wt 201.6 lb

## 2018-04-09 DIAGNOSIS — Z87898 Personal history of other specified conditions: Secondary | ICD-10-CM | POA: Diagnosis not present

## 2018-04-09 DIAGNOSIS — M2550 Pain in unspecified joint: Secondary | ICD-10-CM | POA: Diagnosis not present

## 2018-04-09 DIAGNOSIS — E039 Hypothyroidism, unspecified: Secondary | ICD-10-CM

## 2018-04-09 DIAGNOSIS — E781 Pure hyperglyceridemia: Secondary | ICD-10-CM | POA: Diagnosis not present

## 2018-04-09 DIAGNOSIS — I10 Essential (primary) hypertension: Secondary | ICD-10-CM

## 2018-04-09 DIAGNOSIS — G40909 Epilepsy, unspecified, not intractable, without status epilepticus: Secondary | ICD-10-CM

## 2018-04-09 DIAGNOSIS — T7840XA Allergy, unspecified, initial encounter: Secondary | ICD-10-CM

## 2018-04-09 LAB — POCT URINALYSIS DIPSTICK
Blood, UA: NEGATIVE
Glucose, UA: NEGATIVE
Ketones, UA: NEGATIVE
Leukocytes, UA: NEGATIVE
Nitrite, UA: NEGATIVE
Protein, UA: NEGATIVE
Spec Grav, UA: >=1.03 — AB (ref 1.010–1.025)
Urobilinogen, UA: 0.2 E.U./dL
pH, UA: 5.5 (ref 5.0–8.0)

## 2018-04-09 LAB — POCT GLYCOSYLATED HEMOGLOBIN (HGB A1C): Hemoglobin A1C: 5.5 % (ref 4.0–5.6)

## 2018-04-09 MED ORDER — METHYLPREDNISOLONE SODIUM SUCC 125 MG IJ SOLR
125.0000 mg | Freq: Once | INTRAMUSCULAR | Status: AC
Start: 2018-04-09 — End: 2018-04-09
  Administered 2018-04-09: 125 mg via INTRAMUSCULAR

## 2018-04-09 MED ORDER — TRIAMCINOLONE ACETONIDE 0.5 % EX OINT: 1.0000 "application " | TOPICAL_OINTMENT | Freq: Two times a day (BID) | CUTANEOUS | 0 refills | Status: DC

## 2018-04-09 NOTE — Patient Instructions (Signed)
Methylprednisolone Solution for Injection What is this medicine? METHYLPREDNISOLONE (meth ill pred NISS [place] lone) is a corticosteroid. It is commonly used to treat inflammation of the skin, joints, lungs, and other organs. Common conditions treated include asthma, allergies, and arthritis. It is also used for other conditions, such as blood disorders and diseases of the adrenal glands. This medicine may be used for other purposes; ask your health care provider or pharmacist if you have questions. COMMON BRAND NAME(S): A-Methapred, Solu-Medrol What should I tell my health care provider before I take this medicine? They need to know if you have any of these conditions: -Cushing's syndrome -eye disease, vision problems -diabetes -glaucoma -heart disease -high blood pressure -infection (especially a virus infection such as chickenpox, cold sores, or herpes) -liver disease -mental illness -myasthenia gravis -osteoporosis -recently received or scheduled to receive a vaccine -seizures -stomach or intestine problems -thyroid disease -an unusual or allergic reaction to lactose, methylprednisolone, other medicines, foods, dyes, or preservatives -pregnant or trying to get pregnant -breast-feeding How should I use this medicine? This medicine is for injection or infusion into a vein. It is also for injection into a muscle. It is given by a health care professional in a hospital or clinic setting. Talk to your pediatrician regarding the use of this medicine in children. While this drug may be prescribed for selected conditions, precautions do apply. Overdosage: If you think you have taken too much of this medicine contact a poison control center or emergency room at once. NOTE: This medicine is only for you. Do not share this medicine with others. What if I miss a dose? This does not apply. What may interact with this medicine? Do not take this medicine with any of the following  medications: -alefacept -echinacea -iopamidol -live virus vaccines -metyrapone -mifepristone This medicine may also interact with the following medications: -amphotericin B -aspirin and aspirin-like medicines -certain antibiotics like erythromycin, clarithromycin, troleandomycin -certain medicines for diabetes -certain medicines for fungal infection like ketoconazole -certain medicines for seizures like carbamazepine, phenobarbital, phenytoin -certain medicines that treat or prevent blood clots like warfarin -cyclosporine -digoxin -diuretics -female hormones, like estrogens and birth control pills -isoniazid -NSAIDS, medicines for pain and inflammation, like ibuprofen or naproxen -other medicines for myasthenia gravis -rifampin -vaccines This list may not describe all possible interactions. Give your health care provider a list of all the medicines, herbs, non-prescription drugs, or dietary supplements you use. Also tell them if you smoke, drink alcohol, or use illegal drugs. Some items may interact with your medicine. What should I watch for while using this medicine? Tell your doctor or healthcare professional if your symptoms do not start to get better or if they get worse. Do not stop taking except on your doctor's advice. You may develop a severe reaction. Your doctor will tell you how much medicine to take. Your condition will be monitored carefully while you are receiving this medicine. This medicine may increase your risk of getting an infection. Tell your doctor or health care professional if you are around anyone with measles or chickenpox, or if you develop sores or blisters that do not heal properly. This medicine may affect blood sugar levels. If you have diabetes, check with your doctor or health care professional before you change your diet or the dose of your diabetic medicine. Tell your doctor or health care professional right away if you have any change in your  eyesight. Using this medicine for a long time may increase your risk of low bone   mass. Talk to your doctor about bone health. What side effects may I notice from receiving this medicine? Side effects that you should report to your doctor or health care professional as soon as possible: -allergic reactions like skin rash, itching or hives, swelling of the face, lips, or tongue -bloody or tarry stools -changes in vision -hallucination, loss of contact with reality -muscle cramps -muscle pain -palpitations -signs and symptoms of high blood sugar such as dizziness; dry mouth; dry skin; fruity breath; nausea; stomach pain; increased hunger or thirst; increased urination -signs and symptoms of infection like fever or chills; cough; sore throat; pain or trouble passing urine -trouble passing urine or change in the amount of urine Side effects that usually do not require medical attention (report to your doctor or health care professional if they continue or are bothersome): -changes in emotions or mood -constipation -diarrhea -excessive hair growth on the face or body -headache -nausea, vomiting -pain, redness, or irritation at site where injected -trouble sleeping -weight gain This list may not describe all possible side effects. Call your doctor for medical advice about side effects. You may report side effects to FDA at 1-800-FDA-1088. Where should I keep my medicine? This drug is given in a hospital or clinic and will not be stored at home. NOTE: This sheet is a summary. It may not cover all possible information. If you have questions about this medicine, talk to your doctor, pharmacist, or health care provider.  2018 Elsevier/Gold Standard (2015-09-08 16:21:28) Contact Dermatitis Dermatitis is redness, soreness, and swelling (inflammation) of the skin. Contact dermatitis is a reaction to certain substances that touch the skin. You either touched something that irritated your skin, or you  have allergies to something you touched. Follow these instructions at home: [place] your skin as needed.  Apply cool compresses to the affected areas.  Try taking a bath with: ? Epsom salts. Follow the instructions on the package. You can get these at a pharmacy or grocery store. ? Baking soda. Pour a small amount into the bath as told by your doctor. ? Colloidal oatmeal. Follow the instructions on the package. You can get this at a pharmacy or grocery store.  Try applying baking soda paste to your skin. Stir water into baking soda until it looks like paste.  Do not scratch your skin.  Bathe less often.  Bathe in lukewarm water. Avoid using hot water. Medicines  Take or apply over-the-counter and prescription medicines only as told by your doctor.  If you were prescribed an antibiotic medicine, take or apply your antibiotic as told by your doctor. Do not stop taking the antibiotic even if your condition starts to get better. General instructions  Keep all follow-up visits as told by your doctor. This is important.  Avoid the substance that caused your reaction. If you do not know what caused it, keep a journal to try to track what caused it. Write down: ? What you eat. ? What cosmetic products you use. ? What you drink. ? What you wear in the affected area. This includes jewelry.  If you were given a bandage (dressing), take care of it as told by your doctor. This includes when to change and remove it. Contact a doctor if:  You do not get better with treatment.  Your condition gets worse.  You have signs of infection such as: ? Swelling. ? Tenderness. ? Redness. ? Soreness. ? Warmth.  You have a fever.  You have new  symptoms. Get help right away if:  You have a very bad headache.  You have neck pain.  Your neck is stiff.  You throw up (vomit).  You feel very sleepy.  You see red streaks coming from the affected area.  Your bone or joint  underneath the affected area becomes painful after the skin has healed.  The affected area turns darker.  You have trouble breathing. This information is not intended to replace advice given to you by your health care provider. Make sure you discuss any questions you have with your health care provider. Document Released: 04/29/2009 Document Revised: 12/08/2015 Document Reviewed: 11/17/2014 Elsevier Interactive Patient Education  2018 [employer].
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
## 2018-04-10 ENCOUNTER — Encounter: Payer: Self-pay | Admitting: Internal Medicine

## 2018-04-10 ENCOUNTER — Ambulatory Visit (INDEPENDENT_AMBULATORY_CARE_PROVIDER_SITE_OTHER): Payer: BLUE CROSS/BLUE SHIELD | Admitting: Internal Medicine

## 2018-04-10 ENCOUNTER — Telehealth: Payer: Self-pay | Admitting: *Deleted

## 2018-04-10 VITALS — BP 129/83 | HR 71 | Temp 98.2°F | Wt 202.0 lb

## 2018-04-10 DIAGNOSIS — F1020 Alcohol dependence, uncomplicated: Secondary | ICD-10-CM

## 2018-04-10 DIAGNOSIS — Z23 Encounter for immunization: Secondary | ICD-10-CM

## 2018-04-10 DIAGNOSIS — B182 Chronic viral hepatitis C: Secondary | ICD-10-CM | POA: Diagnosis not present

## 2018-04-10 DIAGNOSIS — R21 Rash and other nonspecific skin eruption: Secondary | ICD-10-CM | POA: Diagnosis not present

## 2018-04-10 DIAGNOSIS — R740 Nonspecific elevation of levels of transaminase and lactic acid dehydrogenase [LDH]: Secondary | ICD-10-CM | POA: Diagnosis not present

## 2018-04-10 DIAGNOSIS — T7840XA Allergy, unspecified, initial encounter: Secondary | ICD-10-CM

## 2018-04-10 DIAGNOSIS — R7401 Elevation of levels of liver transaminase levels: Secondary | ICD-10-CM | POA: Insufficient documentation

## 2018-04-10 MED ORDER — DARUN-COBIC-EMTRICIT-TENOFAF 800-150-200-10 MG PO TABS: 1.0000 | ORAL_TABLET | Freq: Every day | ORAL | 5 refills | Status: DC

## 2018-04-10 MED FILL — SYMTUZA 800-150-200-10 MG T: 800-150-200 | 30 days supply | Qty: 30 | Fill #0

## 2018-04-10 NOTE — Telephone Encounter (Signed)
SHe will come today, is grateful for the appointment! Landis Gandy, RN

## 2018-04-10 NOTE — Assessment & Plan Note (Addendum)
Genotype 1b. She will get elastography now and after that, get her on Epclusa (on pravastatin) for 12 weeks.  Will send in once ultrasound is done.

## 2018-04-10 NOTE — Assessment & Plan Note (Signed)
I will recheck her labs in 1 week

## 2018-04-10 NOTE — Assessment & Plan Note (Signed)
She continues to do very well, remaining sober, despite recent challenges and is getting appropriate therapy.

## 2018-04-10 NOTE — Assessment & Plan Note (Addendum)
I think this is from [place].  She has tolerated [place] in the past just fine.   I will switch her to [place].   She can fill today and will get through her new specialty pharmacy. She will follow up on this with her hep C labs in the next 1-2 months

## 2018-04-10 NOTE — Telephone Encounter (Signed)
-----   Message from Thayer Headings, MD sent at 04/10/2018  9:24 AM EDT ----- Can you have her come in this week for a work in, new rash after starting Biktarvy, transaminitis.  With me or NP. thanks
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
## 2018-04-11 LAB — COMPREHENSIVE METABOLIC PANEL
ALT: 128 IU/L — ABNORMAL HIGH (ref 0–32)
AST: 174 IU/L — ABNORMAL HIGH (ref 0–40)
Albumin/Globulin Ratio: 1 — ABNORMAL LOW (ref 1.2–2.2)
Albumin: 4.1 g/dL (ref 3.5–5.5)
Alkaline Phosphatase: 93 IU/L (ref 39–117)
BUN/Creatinine Ratio: 19 (ref 9–23)
BUN: 16 mg/dL (ref 6–24)
Bilirubin Total: 0.5 mg/dL (ref 0.0–1.2)
CO2: 25 mmol/L (ref 20–29)
Calcium: 9.7 mg/dL (ref 8.7–10.2)
Chloride: 100 mmol/L (ref 96–106)
Creatinine, Ser: 0.86 mg/dL (ref 0.57–1.00)
GFR calc Af Amer: 90 mL/min/{1.73_m2} (ref >59)
GFR calc non Af Amer: 78 mL/min/{1.73_m2} (ref >59)
Globulin, Total: 4.3 g/dL (ref 1.5–4.5)
Glucose: 92 mg/dL (ref 65–99)
Potassium: 3.5 mmol/L (ref 3.5–5.2)
Sodium: 141 mmol/L (ref 134–144)
Total Protein: 8.4 g/dL (ref 6.0–8.5)

## 2018-04-11 LAB — THYROID PANEL WITH TSH
Free Thyroxine Index: 2 (ref 1.2–4.9)
T3 Uptake Ratio: 19 % — ABNORMAL LOW (ref 24–39)
T4, Total: 10.4 ug/dL (ref 4.5–12.0)
TSH: 3.51 u[IU]/mL (ref 0.450–4.500)

## 2018-04-11 LAB — LEVETIRACETAM LEVEL: Levetiracetam Lvl: 3.5 ug/mL — ABNORMAL LOW (ref 10.0–40.0)

## 2018-04-14 ENCOUNTER — Ambulatory Visit (HOSPITAL_COMMUNITY): Payer: BLUE CROSS/BLUE SHIELD

## 2018-04-15 ENCOUNTER — Other Ambulatory Visit: Payer: Self-pay | Admitting: *Deleted

## 2018-04-16 ENCOUNTER — Other Ambulatory Visit: Payer: Self-pay | Admitting: Internal Medicine

## 2018-04-16 ENCOUNTER — Ambulatory Visit (HOSPITAL_COMMUNITY)
Admission: RE | Admit: 2018-04-16 | Discharge: 2018-04-16 | Disposition: A | Discharge status: Home / Self Care | Payer: BLUE CROSS/BLUE SHIELD | Source: Ambulatory Visit | Attending: Internal Medicine | Admitting: Internal Medicine

## 2018-04-16 ENCOUNTER — Other Ambulatory Visit: Payer: Self-pay | Admitting: Behavioral Health

## 2018-04-16 DIAGNOSIS — B20 Human immunodeficiency virus [HIV] disease: Secondary | ICD-10-CM

## 2018-04-16 DIAGNOSIS — Z9049 Acquired absence of other specified parts of digestive tract: Secondary | ICD-10-CM | POA: Diagnosis not present

## 2018-04-16 DIAGNOSIS — B182 Chronic viral hepatitis C: Secondary | ICD-10-CM | POA: Diagnosis not present

## 2018-04-16 MED ORDER — DARUN-COBIC-EMTRICIT-TENOFAF 800-150-200-10 MG PO TABS: 1.0000 | ORAL_TABLET | Freq: Every day | ORAL | 5 refills | Status: DC

## 2018-04-16 MED ORDER — SOFOSBUVIR-VELPATASVIR 400-100 MG PO TABS: 1.0000 | ORAL_TABLET | Freq: Every day | ORAL | 2 refills | Status: DC

## 2018-04-17 ENCOUNTER — Ambulatory Visit: Payer: Self-pay | Admitting: Family Medicine

## 2018-04-18 ENCOUNTER — Other Ambulatory Visit: Payer: Self-pay

## 2018-04-22 ENCOUNTER — Other Ambulatory Visit: Payer: Self-pay | Admitting: Pharmacist

## 2018-04-22 DIAGNOSIS — B182 Chronic viral hepatitis C: Secondary | ICD-10-CM

## 2018-04-22 MED ORDER — SOFOSBUVIR-VELPATASVIR 400-100 MG PO TABS: 1.0000 | ORAL_TABLET | Freq: Every day | ORAL | 2 refills | Status: DC

## 2018-04-22 NOTE — Progress Notes (Signed)
Transferred Epclusa to [facility] per her [employer].

## 2018-04-23 ENCOUNTER — Encounter: Payer: Self-pay | Admitting: Pharmacy Technician

## 2018-05-22 ENCOUNTER — Other Ambulatory Visit (HOSPITAL_COMMUNITY)
Admission: RE | Admit: 2018-05-22 | Discharge: 2018-05-22 | Disposition: A | Discharge status: Home / Self Care | Payer: BLUE CROSS/BLUE SHIELD | Source: Ambulatory Visit | Attending: Internal Medicine | Admitting: Internal Medicine

## 2018-05-22 ENCOUNTER — Other Ambulatory Visit: Payer: BLUE CROSS/BLUE SHIELD

## 2018-05-22 DIAGNOSIS — R7401 Elevation of levels of liver transaminase levels: Secondary | ICD-10-CM

## 2018-05-22 DIAGNOSIS — Z113 Encounter for screening for infections with a predominantly sexual mode of transmission: Secondary | ICD-10-CM | POA: Diagnosis not present

## 2018-05-22 DIAGNOSIS — R740 Nonspecific elevation of levels of transaminase and lactic acid dehydrogenase [LDH]: Secondary | ICD-10-CM

## 2018-05-22 DIAGNOSIS — B20 Human immunodeficiency virus [HIV] disease: Secondary | ICD-10-CM

## 2018-05-23 LAB — T-HELPER CELL (CD4) - (RCID CLINIC ONLY)
CD4 % Helper T Cell: 42 % (ref 33–55)
CD4 T Cell Abs: 1280 /uL (ref 400–2700)

## 2018-05-23 LAB — URINE CYTOLOGY ANCILLARY ONLY
CHLAMYDIA, DNA PROBE: NEGATIVE
Neisseria Gonorrhea: NEGATIVE

## 2018-05-26 LAB — COMPLETE METABOLIC PANEL WITH GFR
AG Ratio: 0.9 (calc) — ABNORMAL LOW (ref 1.0–2.5)
ALBUMIN MSPROF: 3.9 g/dL (ref 3.6–5.1)
ALKALINE PHOSPHATASE (APISO): 77 U/L (ref 33–130)
ALT: 14 U/L (ref 6–29)
AST: 24 U/L (ref 10–35)
BILIRUBIN TOTAL: 0.4 mg/dL (ref 0.2–1.2)
BUN / CREAT RATIO: 12 (calc) (ref 6–22)
BUN: 13 mg/dL (ref 7–25)
CO2: 30 mmol/L (ref 20–32)
Calcium: 9.6 mg/dL (ref 8.6–10.4)
Chloride: 101 mmol/L (ref 98–110)
Creat: 1.11 mg/dL — ABNORMAL HIGH (ref 0.50–1.05)
GFR, EST AFRICAN AMERICAN: 66 mL/min/{1.73_m2} (ref >=60)
GFR, Est Non African American: 57 mL/min/{1.73_m2} — ABNORMAL LOW (ref >=60)
GLOBULIN: 4.5 g/dL — AB (ref 1.9–3.7)
Glucose, Bld: 109 mg/dL — ABNORMAL HIGH (ref 65–99)
POTASSIUM: 3.9 mmol/L (ref 3.5–5.3)
Sodium: 138 mmol/L (ref 135–146)
Total Protein: 8.4 g/dL — ABNORMAL HIGH (ref 6.1–8.1)

## 2018-05-26 LAB — HIV-1 RNA QUANT-NO REFLEX-BLD
HIV 1 RNA Quant: 25 copies/mL — ABNORMAL HIGH
HIV-1 RNA Quant, Log: 1.4 Log copies/mL — ABNORMAL HIGH

## 2018-05-26 LAB — CBC WITH DIFFERENTIAL/PLATELET
BASOS ABS: 27 {cells}/uL (ref 0–200)
Basophils Relative: 0.4 %
Eosinophils Absolute: 150 cells/uL (ref 15–500)
Eosinophils Relative: 2.2 %
HEMATOCRIT: 39.1 % (ref 35.0–45.0)
Hemoglobin: 13.5 g/dL (ref 11.7–15.5)
LYMPHS ABS: 3046 {cells}/uL (ref 850–3900)
MCH: 30.5 pg (ref 27.0–33.0)
MCHC: 34.5 g/dL (ref 32.0–36.0)
MCV: 88.5 fL (ref 80.0–100.0)
MPV: 13.7 fL — ABNORMAL HIGH (ref 7.5–12.5)
Monocytes Relative: 9.1 %
NEUTROS PCT: 43.5 %
Neutro Abs: 2958 cells/uL (ref 1500–7800)
Platelets: 147 10*3/uL (ref 140–400)
RBC: 4.42 10*6/uL (ref 3.80–5.10)
RDW: 12.8 % (ref 11.0–15.0)
TOTAL LYMPHOCYTE: 44.8 %
WBC mixed population: 619 cells/uL (ref 200–950)
WBC: 6.8 10*3/uL (ref 3.8–10.8)

## 2018-05-26 LAB — RPR: RPR: NONREACTIVE

## 2018-05-30 ENCOUNTER — Ambulatory Visit: Payer: Self-pay | Admitting: Family Medicine

## 2018-06-05 ENCOUNTER — Encounter: Payer: Self-pay | Admitting: Internal Medicine

## 2018-06-05 ENCOUNTER — Ambulatory Visit: Payer: BLUE CROSS/BLUE SHIELD | Admitting: Internal Medicine

## 2018-06-05 VITALS — BP 149/100 | HR 90 | Temp 98.0°F | Ht 69.0 in | Wt 221.0 lb

## 2018-06-05 DIAGNOSIS — F1020 Alcohol dependence, uncomplicated: Secondary | ICD-10-CM

## 2018-06-05 DIAGNOSIS — G629 Polyneuropathy, unspecified: Secondary | ICD-10-CM

## 2018-06-05 DIAGNOSIS — R21 Rash and other nonspecific skin eruption: Secondary | ICD-10-CM | POA: Diagnosis not present

## 2018-06-05 DIAGNOSIS — Z21 Asymptomatic human immunodeficiency virus [HIV] infection status: Secondary | ICD-10-CM

## 2018-06-05 DIAGNOSIS — B182 Chronic viral hepatitis C: Secondary | ICD-10-CM

## 2018-06-05 DIAGNOSIS — T7840XA Allergy, unspecified, initial encounter: Secondary | ICD-10-CM

## 2018-06-05 NOTE — Progress Notes (Signed)
   Subjective:    Patient ID: Cheryl Thompson, female    DOB: 1965-11-22, 52 y.o.   MRN: [id number]  HPI She is here for follow-up of hepatitis C and HIV. She started treatment for chronic hepatitis C about 3 weeks ago and is taking [place].  She is getting this through her specialty pharmacy.  She will take this for 12 weeks.  She has had no significant issues with fatigue or headaches associated with this medication.  She also has been on Symtuza for HIV though unfortunately has been out about 3 days.  She states because she has not been feeling well, she has not reached out to get her refill.  She does have a no new problems with associated rash or diarrhea. She is concerned with having pneumonia.  No current fever.  Some productive cough.     Review of Systems  Constitutional: Negative for fever.  Gastrointestinal: Negative for diarrhea and nausea.  Skin: Negative for rash.       Objective:   Physical Exam  Constitutional: She appears well-developed and well-nourished.  Eyes: No scleral icterus.  Cardiovascular: Normal rate, regular rhythm and normal heart sounds.  Pulmonary/Chest: Effort normal and breath sounds normal. No respiratory distress. She has no wheezes.  Skin: No rash noted.   SH: + tobacco       Assessment & Plan:

## 2018-06-05 NOTE — Assessment & Plan Note (Signed)
Continues to remain alcohol and drug free.

## 2018-06-05 NOTE — Assessment & Plan Note (Signed)
Doing well on treatment.  Will check her RNA today and see her back in 3 months.

## 2018-06-05 NOTE — Assessment & Plan Note (Signed)
Doing well though unfortunately off of medications.  She will call today to get back on track.  rtc 3 months.

## 2018-06-05 NOTE — Assessment & Plan Note (Signed)
She is continuing to follow with neurology

## 2018-06-05 NOTE — Assessment & Plan Note (Signed)
No resolved.

## 2018-06-09 ENCOUNTER — Telehealth: Payer: Self-pay | Admitting: *Deleted

## 2018-06-09 LAB — HEPATITIS C RNA QUANTITATIVE
HCV QUANT LOG: NOT DETECTED {Log_IU}/mL
HCV RNA, PCR, QN: NOT DETECTED [IU]/mL

## 2018-06-09 NOTE — Telephone Encounter (Signed)
Patient called to advise that she had a bad weekend. She is having pain around her liver and wants to know what her labs showed. Advised will ask the provider know and once he responds will give her a call back.

## 2018-06-09 NOTE — Telephone Encounter (Signed)
Lattie Haw called back, RN relayed message. She reports diarrhea, nausea and a recently pulled groin muscle.  She will call her primary care to see if it is something else. Landis Gandy, RN

## 2018-06-09 NOTE — Telephone Encounter (Signed)
Her labs look great.  Her liver enzymes are no longer elevated since she is on hep C treatment, her CD4 count is good over 1000 and her HIV virus was minimally elevated despite missing some days.  Her HCV virus also is nearly gone.

## 2018-06-23 ENCOUNTER — Ambulatory Visit (HOSPITAL_COMMUNITY)
Admission: RE | Admit: 2018-06-23 | Discharge: 2018-06-23 | Disposition: A | Discharge status: Home / Self Care | Payer: BLUE CROSS/BLUE SHIELD | Source: Ambulatory Visit | Attending: Family Medicine | Admitting: Family Medicine

## 2018-06-23 ENCOUNTER — Encounter: Payer: Self-pay | Admitting: Family Medicine

## 2018-06-23 ENCOUNTER — Ambulatory Visit (INDEPENDENT_AMBULATORY_CARE_PROVIDER_SITE_OTHER): Payer: BLUE CROSS/BLUE SHIELD | Admitting: Family Medicine

## 2018-06-23 VITALS — BP 144/98 | HR 90 | Temp 99.1°F | Resp 16 | Ht 69.0 in | Wt 216.0 lb

## 2018-06-23 DIAGNOSIS — J40 Bronchitis, not specified as acute or chronic: Secondary | ICD-10-CM | POA: Diagnosis present

## 2018-06-23 DIAGNOSIS — R102 Pelvic and perineal pain: Secondary | ICD-10-CM | POA: Diagnosis not present

## 2018-06-23 LAB — POCT URINALYSIS DIP (CLINITEK)
Blood, UA: NEGATIVE
Glucose, UA: NEGATIVE mg/dL
Leukocytes, UA: NEGATIVE
Nitrite, UA: NEGATIVE
Spec Grav, UA: 1.02 (ref 1.010–1.025)
Urobilinogen, UA: 1 E.U./dL
pH, UA: 6 (ref 5.0–8.0)

## 2018-06-23 MED ORDER — AZITHROMYCIN 250 MG PO TABS: ORAL_TABLET | ORAL | 0 refills | Status: DC

## 2018-06-23 MED ORDER — IPRATROPIUM BROMIDE 0.02 % IN SOLN
0.5000 mg | Freq: Once | RESPIRATORY_TRACT | Status: AC
  Administered 2018-06-23: 0.5 mg via RESPIRATORY_TRACT

## 2018-06-23 MED ORDER — METHYLPREDNISOLONE 4 MG PO TBPK: ORAL_TABLET | ORAL | 0 refills | Status: DC

## 2018-06-23 MED ORDER — ALBUTEROL SULFATE (2.5 MG/3ML) 0.083% IN NEBU: 2.5000 mg | INHALATION_SOLUTION | Freq: Four times a day (QID) | RESPIRATORY_TRACT | 1 refills | Status: DC | PRN

## 2018-06-23 MED ORDER — ALBUTEROL SULFATE (2.5 MG/3ML) 0.083% IN NEBU
2.5000 mg | INHALATION_SOLUTION | Freq: Once | RESPIRATORY_TRACT | Status: AC
  Administered 2018-06-23: 2.5 mg via RESPIRATORY_TRACT

## 2018-06-23 MED ORDER — ALBUTEROL SULFATE HFA 108 (90 BASE) MCG/ACT IN AERS: 2.0000 | INHALATION_SPRAY | Freq: Four times a day (QID) | RESPIRATORY_TRACT | 1 refills | Status: DC | PRN

## 2018-06-23 NOTE — Progress Notes (Signed)
  [facility]   Progress Note: General Provider: Lanae Boast, FNP  SUBJECTIVE:   Cheryl Thompson is a 52 y.o. female who  has a past medical history of Acute alcoholic hepatitis, Depression, Diverticulitis (y-1), Diverticulosis (y-1), Hepatitis C, HIV (human immunodeficiency virus infection) ([place]), Hypertension, Seizures ([place]), Stroke ([place]), and Thyroid disease.. Patient presents today for Cough (x 1 week ); Nasal Congestion; and Pelvic Pain (right side )  Cough  This is a new problem. The current episode started 1 to 4 weeks ago. The problem has been gradually worsening. The problem occurs every few minutes. The cough is productive of sputum. Associated symptoms include myalgias, nasal congestion, shortness of breath, sweats and wheezing. Pertinent negatives include no sore throat. The symptoms are aggravated by lying down. Risk factors for lung disease include smoking/tobacco exposure. She has tried OTC cough suppressant for the symptoms. The treatment provided mild relief. Her past medical history is significant for bronchitis and pneumonia. There is no history of environmental allergies.  Pelvic Pain  The patient's primary symptoms include pelvic pain. This is a new problem. The current episode started 1 to 4 weeks ago. The problem occurs intermittently. The pain is mild. The problem affects the right side. She is not pregnant. Associated symptoms include abdominal pain, frequency, nausea and urgency. Pertinent negatives include no dysuria or sore throat. Treatments tried: increased her water intake. She is not sexually active. She uses hysterectomy for contraception. She is postmenopausal.    Review of Systems  HENT: Negative for sore throat.   Respiratory: Positive for cough, shortness of breath and wheezing.   Gastrointestinal: Positive for abdominal pain and nausea.  Genitourinary: Positive for frequency, pelvic pain and urgency. Negative  for dysuria.  Musculoskeletal: Positive for myalgias.  Endo/Heme/Allergies: Negative for environmental allergies.     OBJECTIVE: BP (!) 144/98 (BP Location: Left Arm, Patient Position: Sitting, Cuff Size: Normal) Comment: manually  Pulse 90   Temp 99.1 F (37.3 C) (Oral)   Resp 16   Ht 5\' 9"  (1.753 m)   Wt 216 lb (98 kg)   SpO2 100%   BMI 31.90 kg/m   Wt Readings from Last 3 Encounters:  06/23/18 216 lb (98 kg)  06/05/18 221 lb (100.2 kg)  04/10/18 202 lb (91.6 kg)     Physical Exam  ASSESSMENT/PLAN:   There are no diagnoses linked to this encounter.       The patient was given clear instructions to go to ER or return to medical center if symptoms do not improve, worsen or new problems develop. The patient verbalized understanding and agreed with plan of care.   [name], FNP-BC [facility] [address] [place],  [id number] [phone]     This note has been created with Dragon speech recognition software and smart phrase technology. Any transcriptional errors are unintentional.

## 2018-06-25 ENCOUNTER — Telehealth: Payer: Self-pay

## 2018-06-25 DIAGNOSIS — I10 Essential (primary) hypertension: Secondary | ICD-10-CM

## 2018-06-25 MED ORDER — HYDROCHLOROTHIAZIDE 25 MG PO TABS: 25.0000 mg | ORAL_TABLET | Freq: Every day | ORAL | 5 refills | Status: DC

## 2018-06-25 NOTE — Telephone Encounter (Signed)
Refill for hctz sent into pharmacy. Thanks!  

## 2018-07-01 DIAGNOSIS — M25551 Pain in right hip: Secondary | ICD-10-CM | POA: Insufficient documentation

## 2018-07-10 ENCOUNTER — Ambulatory Visit: Payer: Self-pay | Admitting: Family Medicine

## 2018-07-11 ENCOUNTER — Ambulatory Visit: Payer: Self-pay | Admitting: Family Medicine

## 2018-07-21 ENCOUNTER — Other Ambulatory Visit: Payer: Self-pay | Admitting: Orthopaedic Surgery

## 2018-07-21 DIAGNOSIS — M25551 Pain in right hip: Secondary | ICD-10-CM

## 2018-07-23 ENCOUNTER — Ambulatory Visit (INDEPENDENT_AMBULATORY_CARE_PROVIDER_SITE_OTHER): Payer: Self-pay | Admitting: Family Medicine

## 2018-07-23 ENCOUNTER — Encounter: Payer: Self-pay | Admitting: Family Medicine

## 2018-07-23 VITALS — BP 135/87 | HR 82 | Temp 98.1°F | Resp 16 | Ht 69.0 in | Wt 218.0 lb

## 2018-07-23 DIAGNOSIS — G40909 Epilepsy, unspecified, not intractable, without status epilepticus: Secondary | ICD-10-CM

## 2018-07-23 DIAGNOSIS — I10 Essential (primary) hypertension: Secondary | ICD-10-CM

## 2018-07-23 DIAGNOSIS — E039 Hypothyroidism, unspecified: Secondary | ICD-10-CM

## 2018-07-23 DIAGNOSIS — F17200 Nicotine dependence, unspecified, uncomplicated: Secondary | ICD-10-CM

## 2018-07-23 MED ORDER — LEVETIRACETAM 500 MG PO TABS: 500.0000 mg | ORAL_TABLET | Freq: Two times a day (BID) | ORAL | 2 refills | Status: DC

## 2018-07-23 MED ORDER — LEVOTHYROXINE SODIUM 150 MCG PO TABS: 150.0000 ug | ORAL_TABLET | Freq: Every day | ORAL | 1 refills | Status: DC

## 2018-07-23 NOTE — Progress Notes (Signed)
[facility]   Progress Note: General Provider: Lanae Boast, FNP  SUBJECTIVE:   Cheryl Thompson is a 53 y.o. female who  has a past medical history of Acute alcoholic hepatitis, Depression, Diverticulitis (y-1), Diverticulosis (y-1), Hepatitis C, HIV (human immunodeficiency virus infection) ([place]), Hypertension, Seizures ([place]), Stroke ([place]), and Thyroid disease.. Patient presents today for Hypertension; Diarrhea; Follow-up; and Nicotine Dependence (WANTS TO QUIT )  Patient would like a referral to the smoking cessation program.  Patient reports inconsistent intake of medications.States that she misses doses often.    Review of Systems  Constitutional: Negative.   HENT: Negative.   Eyes: Negative.   Respiratory: Negative.   Cardiovascular: Negative.   Gastrointestinal: Negative.   Genitourinary: Negative.   Musculoskeletal: Positive for joint pain.  Skin: Negative.   Neurological: Negative.   Psychiatric/Behavioral: Negative.      OBJECTIVE: BP 135/87 (BP Location: Right Arm, Patient Position: Sitting, Cuff Size: Normal)   Pulse 82   Temp 98.1 F (36.7 C) (Oral)   Resp 16   Ht 5\' 9"  (1.753 m)   Wt 218 lb (98.9 kg)   SpO2 100%   BMI 32.19 kg/m   Wt Readings from Last 3 Encounters:  07/23/18 218 lb (98.9 kg)  06/23/18 216 lb (98 kg)  06/05/18 221 lb (100.2 kg)     Physical Exam Vitals signs and nursing note reviewed.  Constitutional:      General: She is not in acute distress.    Appearance: She is well-developed.  HENT:     Head: Normocephalic and atraumatic.  Eyes:     Conjunctiva/sclera: Conjunctivae normal.     Pupils: Pupils are equal, round, and reactive to light.  Neck:     Musculoskeletal: Normal range of motion.  Cardiovascular:     Rate and Rhythm: Normal rate and regular rhythm.     Heart sounds: Normal heart sounds.  Pulmonary:     Effort: Pulmonary effort is normal. No respiratory distress.   Breath sounds: Normal breath sounds.  Abdominal:     General: Bowel sounds are normal. There is no distension.     Palpations: Abdomen is soft.  Musculoskeletal:     Comments: Ambulating with cane  Skin:    General: Skin is warm and dry.  Neurological:     Mental Status: She is alert and oriented to person, place, and time.  Psychiatric:        Behavior: Behavior normal.        Thought Content: Thought content normal.     ASSESSMENT/PLAN:  1. Seizure disorder (HCC) - levETIRAcetam (KEPPRA) 500 MG tablet; Take 1 tablet (500 mg total) by mouth 2 (two) times daily.  Dispense: 180 tablet; Refill: 2  2. Essential hypertension, benign - Urinalysis Dipstick  3. Hypothyroidism, unspecified type - levothyroxine (SYNTHROID, LEVOTHROID) 150 MCG tablet; Take 1 tablet (150 mcg total) by mouth daily before breakfast. Reported on 12/13/2015  Dispense: 90 tablet; Refill: 1 - Thyroid Panel With TSH  4. Tobacco dependence - Ambulatory referral to Smoking Cessation Program     Return in about 3 months (around 10/22/2018) for BP.    The patient was given clear instructions to go to ER or return to medical center if symptoms do not improve, worsen or new problems develop. The patient verbalized understanding and agreed with plan of care.   [name], FNP-BC [facility],  [place] [address] [phone]

## 2018-07-23 NOTE — Patient Instructions (Signed)
Steps to Quit Smoking    Smoking tobacco can be bad for your health. It can also affect almost every organ in your body. Smoking puts you and people around you at risk for many serious long-lasting (chronic) diseases. Quitting smoking is hard, but it is one of the best things that you can do for your health. It is never too late to quit.  What are the benefits of quitting smoking?  When you quit smoking, you lower your risk for getting serious diseases and conditions. They can include:  · Lung cancer or lung disease.  · Heart disease.  · Stroke.  · Heart attack.  · Not being able to have children (infertility).  · Weak bones (osteoporosis) and broken bones (fractures).  If you have coughing, wheezing, and shortness of breath, those symptoms may get better when you quit. You may also get sick less often. If you are pregnant, quitting smoking can help to lower your chances of having a baby of low birth weight.  What can I do to help me quit smoking?  Talk with your doctor about what can help you quit smoking. Some things you can do (strategies) include:  · Quitting smoking totally, instead of slowly cutting back how much you smoke over a period of time.  · Going to in-person counseling. You are more likely to quit if you go to many counseling sessions.  · Using resources and support systems, such as:  ? Online chats with a counselor.  ? Phone quitlines.  ? Printed self-help materials.  ? Support groups or group counseling.  ? Text messaging programs.  ? Mobile phone apps or applications.  · Taking medicines. Some of these medicines may have nicotine in them. If you are pregnant or breastfeeding, do not take any medicines to quit smoking unless your doctor says it is okay. Talk with your doctor about counseling or other things that can help you.  Talk with your doctor about using more than one strategy at the same time, such as taking medicines while you are also going to in-person counseling. This can help make  quitting easier.  What things can I do to make it easier to quit?  Quitting smoking might feel very hard at first, but there is a lot that you can do to make it easier. Take these steps:  · Talk to your family and friends. Ask them to support and encourage you.  · Call phone quitlines, reach out to support groups, or work with a counselor.  · Ask people who smoke to not smoke around you.  · Avoid places that make you want (trigger) to smoke, such as:  ? Bars.  ? Parties.  ? Smoke-break areas at work.  · Spend time with people who do not smoke.  · Lower the stress in your life. Stress can make you want to smoke. Try these things to help your stress:  ? Getting regular exercise.  ? Deep-breathing exercises.  ? Yoga.  ? Meditating.  ? Doing a body scan. To do this, close your eyes, focus on one area of your body at a time from head to toe, and notice which parts of your body are tense. Try to relax the muscles in those areas.  · Download or buy apps on your mobile phone or tablet that can help you stick to your quit plan. There are many free apps, such as QuitGuide from the CDC (Centers for Disease Control and Prevention). You can find more   support from smokefree.gov and other websites.  This information is not intended to replace advice given to you by your health care provider. Make sure you discuss any questions you have with your health care provider.  Document Released: 04/28/2009 Document Revised: 02/28/2016 Document Reviewed: 11/16/2014  Elsevier Interactive Patient Education © 2019 Elsevier Inc.

## 2018-07-24 ENCOUNTER — Telehealth: Payer: Self-pay

## 2018-07-24 LAB — THYROID PANEL WITH TSH
Free Thyroxine Index: 1.2 (ref 1.2–4.9)
T3 Uptake Ratio: 18 % — ABNORMAL LOW (ref 24–39)
T4, Total: 6.6 ug/dL (ref 4.5–12.0)
TSH: 15.63 u[IU]/mL — ABNORMAL HIGH (ref 0.450–4.500)

## 2018-07-24 NOTE — Telephone Encounter (Signed)
Called and spoke with patient. She states she had not taken thyroid medication in 2 weeks before appointment yesterday. She was advised to start back taking as prescribed and keep next follow up visit. Thanks!

## 2018-07-24 NOTE — Telephone Encounter (Signed)
-----   Message from Lanae Boast, [place] sent at 07/24/2018  8:04 AM EST ----- Please check and see if the patient is taking her synthroid for her thyroid. Her TSH is very high. If she is not taking it, she needs to restart and take it daily. If she is taking it, I will need to adjust the dose.

## 2018-07-29 ENCOUNTER — Ambulatory Visit
Admission: RE | Admit: 2018-07-29 | Discharge: 2018-07-29 | Disposition: A | Discharge status: Home / Self Care | Payer: Worker's Compensation | Source: Ambulatory Visit | Attending: Orthopaedic Surgery | Admitting: Orthopaedic Surgery

## 2018-07-29 DIAGNOSIS — M25551 Pain in right hip: Secondary | ICD-10-CM

## 2018-07-29 MED ORDER — METHYLPREDNISOLONE ACETATE 40 MG/ML INJ SUSP (RADIOLOG
120.0000 mg | Freq: Once | INTRAMUSCULAR | Status: AC
  Administered 2018-07-29: 120 mg via INTRA_ARTICULAR

## 2018-07-29 MED ORDER — IOPAMIDOL (ISOVUE-M 200) INJECTION 41%
1.0000 mL | Freq: Once | INTRAMUSCULAR | Status: AC
  Administered 2018-07-29: 1 mL via INTRA_ARTICULAR

## 2018-08-04 ENCOUNTER — Other Ambulatory Visit: Payer: Self-pay | Admitting: Family Medicine

## 2018-08-04 DIAGNOSIS — J40 Bronchitis, not specified as acute or chronic: Secondary | ICD-10-CM

## 2018-08-06 ENCOUNTER — Telehealth: Payer: Self-pay

## 2018-08-06 MED ORDER — NICOTINE 21 MG/24HR TD PT24: 21.0000 mg | MEDICATED_PATCH | Freq: Every day | TRANSDERMAL | 0 refills | Status: DC

## 2018-08-06 NOTE — Telephone Encounter (Signed)
Cheryl Thompson,  Nicotine patches are not on current medication list. Can she have an rx for this?. Please advise. Thanks!

## 2018-08-06 NOTE — Telephone Encounter (Signed)
done

## 2018-08-11 HISTORY — PX: COLONOSCOPY: SHX174

## 2018-08-14 ENCOUNTER — Emergency Department (HOSPITAL_COMMUNITY)
Admission: EM | Admit: 2018-08-14 | Discharge: 2018-08-14 | Disposition: A | Discharge status: Home / Self Care | Payer: Self-pay | Attending: Emergency Medicine | Admitting: Emergency Medicine

## 2018-08-14 DIAGNOSIS — E039 Hypothyroidism, unspecified: Secondary | ICD-10-CM | POA: Insufficient documentation

## 2018-08-14 DIAGNOSIS — R51 Headache: Secondary | ICD-10-CM | POA: Insufficient documentation

## 2018-08-14 DIAGNOSIS — R519 Headache, unspecified: Secondary | ICD-10-CM

## 2018-08-14 DIAGNOSIS — B20 Human immunodeficiency virus [HIV] disease: Secondary | ICD-10-CM | POA: Insufficient documentation

## 2018-08-14 DIAGNOSIS — F17210 Nicotine dependence, cigarettes, uncomplicated: Secondary | ICD-10-CM | POA: Insufficient documentation

## 2018-08-14 DIAGNOSIS — Z79899 Other long term (current) drug therapy: Secondary | ICD-10-CM | POA: Insufficient documentation

## 2018-08-14 NOTE — Discharge Instructions (Signed)
Or worsen, please return to the nearest emergency department.Your blood pressure was reassuring today and as her symptoms have resolved, we do not feel the need to pursue further diagnostic work-up after our shared decision made conversation.  Please follow-up with your primary doctor and stay hydrated.  Please

## 2018-08-14 NOTE — ED Triage Notes (Signed)
Pt here with complaints of feeling like she has high bp. Pt had her bp checked during triage and it is not high and now wants to go home without a workup. MD bedside at this time. Patient verbalizes understanding of discharge instructions. Follow-up instructions reviewed.
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
## 2018-08-29 ENCOUNTER — Ambulatory Visit: Payer: Self-pay | Admitting: Family Medicine

## 2018-09-01 ENCOUNTER — Ambulatory Visit (INDEPENDENT_AMBULATORY_CARE_PROVIDER_SITE_OTHER): Payer: Self-pay | Admitting: Family Medicine

## 2018-09-01 ENCOUNTER — Other Ambulatory Visit (HOSPITAL_COMMUNITY): Payer: Self-pay | Admitting: *Deleted

## 2018-09-01 ENCOUNTER — Encounter: Payer: Self-pay | Admitting: Family Medicine

## 2018-09-01 VITALS — BP 139/86 | HR 81 | Temp 98.6°F | Resp 16 | Ht 69.0 in | Wt 229.0 lb

## 2018-09-01 DIAGNOSIS — N6310 Unspecified lump in the right breast, unspecified quadrant: Secondary | ICD-10-CM

## 2018-09-01 DIAGNOSIS — N644 Mastodynia: Secondary | ICD-10-CM

## 2018-09-01 NOTE — Progress Notes (Signed)
  [name]   Progress Note: Sick Visit Provider: Lanae Boast, FNP  SUBJECTIVE:   Cheryl Thompson is a 53 y.o. female who  has a past medical history of Acute alcoholic hepatitis, Depression, Diverticulitis (y-1), Diverticulosis (y-1), Hepatitis C, HIV (human immunodeficiency virus infection) ([place]), Hypertension, Seizures ([place]), Stroke ([place]), and Thyroid disease.. Patient presents today for Mass (under right arm ) Patient states that she noticed a lump under her right arm a few days ago.  States that the area is mildly tender. Review of Systems  Constitutional: Negative.   HENT: Negative.   Respiratory: Negative.   Cardiovascular: Negative.   Skin:       Lump in right breast  Neurological: Negative.   Endo/Heme/Allergies: Negative.   Psychiatric/Behavioral: Negative.      OBJECTIVE: BP 139/86 (BP Location: Right Arm, Patient Position: Sitting, Cuff Size: Large)   Pulse 81   Temp 98.6 F (37 C) (Oral)   Resp 16   Ht 5\' 9"  (1.753 m)   Wt 229 lb (103.9 kg)   SpO2 100%   BMI 33.82 kg/m   Wt Readings from Last 3 Encounters:  09/01/18 229 lb (103.9 kg)  07/23/18 218 lb (98.9 kg)  06/23/18 216 lb (98 kg)     Physical Exam Vitals signs and nursing note reviewed. Exam conducted with a chaperone present.  Constitutional:      General: She is not in acute distress.    Appearance: Normal appearance.  HENT:     Head: Normocephalic and atraumatic.  Chest:     Breasts:        Right: Mass (Outer lower quadrant) and tenderness present. No nipple discharge or skin change.        Left: Normal.  Lymphadenopathy:     Head:     Right side of head: No submental adenopathy.     Left side of head: No submental adenopathy.     Cervical: No cervical adenopathy.     Upper Body:     Right upper body: No supraclavicular, axillary, pectoral or epitrochlear adenopathy.     Left upper body: No supraclavicular, axillary, pectoral or  epitrochlear adenopathy.  Neurological:     Mental Status: She is alert.     ASSESSMENT/PLAN:   1. Breast pain, right IImaging ordered. Pending results.  - MM Digital Diagnostic Unilat R; Future        The patient was given clear instructions to go to ER or return to medical center if symptoms do not improve, worsen or new problems develop. The patient verbalized understanding and agreed with plan of care.   Cheryl Thompson. Nathaneil Canary, FNP-BC [facility] [address] [place], [place] [address] [phone]     This note has been created with Dragon speech recognition software and smart phrase technology. Any transcriptional errors are unintentional.

## 2018-09-01 NOTE — Patient Instructions (Signed)
Mammogram    A mammogram is an X-ray of the breasts that is done to check for changes that are not normal. This test can screen for and find any changes that may suggest breast cancer. This test can also help to find other changes and variations in the breast.  What happens before the procedure?   Have this test done about 1-2 weeks after your period. This is usually when your breasts are the least tender.   If you are visiting a new doctor or clinic, send any past mammogram images to your new doctor's office.   Wash your breasts and under your arms the day of the test.   Do not use deodorants, perfumes, lotions, or powders on the day of the test.   Take off any jewelry from your neck.   Wear clothes that you can change into and out of easily.  What happens during the procedure?   You will undress from the waist up. You will put on a gown.   You will stand in front of the X-ray machine.   Each breast will be placed between two plastic or glass plates. The plates will press down on your breast for a few seconds. Try to stay as relaxed as possible. This does not cause any harm to your breasts. Any discomfort you feel will be very brief.   X-rays will be taken from different angles of each breast.  The procedure may vary among doctors and hospitals.  What happens after the procedure?   The mammogram will be looked at by a specialist (radiologist).   You may need to do certain parts of the test again. This depends on the quality of the images.   Ask when your test results will be ready. Make sure you get your test results.   You may go back to your normal activities.  This information is not intended to replace advice given to you by your health care provider. Make sure you discuss any questions you have with your health care provider.  Document Released: 09/28/2008 Document Revised: 02/14/2017 Document Reviewed: 09/10/2014  Elsevier Interactive Patient Education  2019 Elsevier Inc.

## 2018-09-09 ENCOUNTER — Encounter: Payer: Self-pay | Admitting: Internal Medicine

## 2018-09-09 ENCOUNTER — Ambulatory Visit (INDEPENDENT_AMBULATORY_CARE_PROVIDER_SITE_OTHER): Payer: Self-pay | Admitting: Internal Medicine

## 2018-09-09 VITALS — BP 159/100 | HR 84 | Temp 98.0°F | Ht 69.0 in | Wt 228.0 lb

## 2018-09-09 DIAGNOSIS — I10 Essential (primary) hypertension: Secondary | ICD-10-CM

## 2018-09-09 DIAGNOSIS — K7460 Unspecified cirrhosis of liver: Secondary | ICD-10-CM

## 2018-09-09 DIAGNOSIS — B182 Chronic viral hepatitis C: Secondary | ICD-10-CM

## 2018-09-09 DIAGNOSIS — Z21 Asymptomatic human immunodeficiency virus [HIV] infection status: Secondary | ICD-10-CM

## 2018-09-09 NOTE — Assessment & Plan Note (Signed)
Unfortunately did have a lapse in her refill but hopefully will still get to cure. Will check today and recheck in 6 months

## 2018-09-09 NOTE — Progress Notes (Signed)
   Subjective:    Patient ID: Cheryl Thompson, female    DOB: [birth year], 53 y.o.   MRN: [id number]  HPI She is here for follow-up of hepatitis C and HIV. She started treatment for chronic hepatitis C though has not yet completed 12 weeks of Epclusa since she had a 2 week delay in a refill.  She has had no significant issues with fatigue or headaches associated with this medication.  She also has been on Symtuza for HIV. She does have a no new problems with no associated rash or diarrhea.     Review of Systems  Constitutional: Negative for fever.  Gastrointestinal: Negative for diarrhea and nausea.  Skin: Negative for rash.       Objective:   Physical Exam Constitutional:      Appearance: She is well-developed.  Eyes:     General: No scleral icterus. Cardiovascular:     Rate and Rhythm: Normal rate and regular rhythm.     Heart sounds: Normal heart sounds.  Pulmonary:     Effort: Pulmonary effort is normal. No respiratory distress.     Breath sounds: Normal breath sounds. No wheezing.  Skin:    Findings: No rash.    SH: + tobacco       Assessment & Plan:

## 2018-09-09 NOTE — Assessment & Plan Note (Signed)
Labs today and rtc 6 months.  

## 2018-09-09 NOTE — Assessment & Plan Note (Signed)
High today but had not taken her medication.  Will take today

## 2018-09-09 NOTE — Assessment & Plan Note (Signed)
Will do [place] screening next visit with ultrasound

## 2018-09-10 LAB — T-HELPER CELL (CD4) - (RCID CLINIC ONLY)
CD4 T CELL HELPER: 46 % (ref 33–55)
CD4 T Cell Abs: 1540 /uL (ref 400–2700)

## 2018-09-11 LAB — COMPLETE METABOLIC PANEL WITH GFR
AG Ratio: 1 (calc) (ref 1.0–2.5)
ALT: 17 U/L (ref 6–29)
AST: 30 U/L (ref 10–35)
Albumin: 3.7 g/dL (ref 3.6–5.1)
Alkaline phosphatase (APISO): 72 U/L (ref 37–153)
BUN: 8 mg/dL (ref 7–25)
CO2: 25 mmol/L (ref 20–32)
CREATININE: 0.87 mg/dL (ref 0.50–1.05)
Calcium: 8.7 mg/dL (ref 8.6–10.4)
Chloride: 108 mmol/L (ref 98–110)
GFR, Est African American: 88 mL/min/{1.73_m2} (ref >=60)
GFR, Est Non African American: 76 mL/min/{1.73_m2} (ref >=60)
GLUCOSE: 134 mg/dL — AB (ref 65–99)
Globulin: 3.7 g/dL (calc) (ref 1.9–3.7)
Potassium: 3.7 mmol/L (ref 3.5–5.3)
Sodium: 141 mmol/L (ref 135–146)
Total Bilirubin: 0.3 mg/dL (ref 0.2–1.2)
Total Protein: 7.4 g/dL (ref 6.1–8.1)

## 2018-09-11 LAB — HIV-1 RNA QUANT-NO REFLEX-BLD
HIV 1 RNA Quant: <20 copies/mL
HIV-1 RNA Quant, Log: <1.3 Log copies/mL

## 2018-09-11 LAB — HEPATITIS C RNA QUANTITATIVE
HCV Quantitative Log: <1.18 Log IU/mL
HCV RNA, PCR, QN: <15 IU/mL

## 2018-09-16 ENCOUNTER — Encounter (HOSPITAL_COMMUNITY): Payer: Self-pay

## 2018-09-16 ENCOUNTER — Ambulatory Visit (HOSPITAL_COMMUNITY)
Admission: RE | Admit: 2018-09-16 | Discharge: 2018-09-16 | Disposition: A | Discharge status: Home / Self Care | Payer: Self-pay | Source: Ambulatory Visit | Attending: Obstetrics and Gynecology | Admitting: Obstetrics and Gynecology

## 2018-09-16 VITALS — BP 140/84 | Wt 227.0 lb

## 2018-09-16 DIAGNOSIS — N6311 Unspecified lump in the right breast, upper outer quadrant: Secondary | ICD-10-CM

## 2018-09-16 DIAGNOSIS — Z1239 Encounter for other screening for malignant neoplasm of breast: Secondary | ICD-10-CM

## 2018-09-16 NOTE — Progress Notes (Signed)
Complaints of a right breast lump x 3 weeks that is painful. Patient states the pain is constant. Patient rates the pain at a 5-6 out of 10.  Pap Smear: Pap smear not completed today. Last Pap smear was 15 years ago and normal per patient. Per patient has no history of an abnormal Pap smear. Patient has a history of a hysterectomy in 2009 due to fibroids. Patient doesn't need any further Pap smears due to her history of a hysterectomy for benign reasons per BCCCP and ACOG guidelines. No Pap smear results are in Epic.  Physical exam: Breasts Breasts symmetrical. No skin abnormalities bilateral breasts. No nipple retraction bilateral breasts. No nipple discharge bilateral breasts. No lymphadenopathy. No lumps palpated left breast. Palpated a lump within the right breast at 10 o'clock 5 cm from the nipple. Complaints of bilateral breast tenderness on exam. Referred patient to the [facility] for a diagnostic mammogram and right breast ultrasound. Appointment scheduled for Wednesday, September 17, 2018 at 0930.        Pelvic/Bimanual No Pap smear completed today since patient has a history of a hysterectomy for benign reasons. Pap smear not indicated per BCCCP guidelines.   Smoking History: Patient has never smoked.  Patient Navigation: Patient education provided. Access to services provided for patient through [facility] program.   Colorectal Cancer Screening: Per patient had a colonoscopy completed in January 2020 at [facility] in [place]. No complaints today.   Breast and Cervical Cancer Risk Assessment: Patient has no family history of breast cancer, known genetic mutations, or radiation treatment to the chest before age 70. Patient has no history of cervical dysplasia. Patient has a history of being immunocompromised. Patient has no history of DES exposure in-utero.  Risk Assessment    Risk Scores      09/16/2018   Last edited by: Armond Hang, LPN   5-year risk: 1 %    Lifetime risk: 6.3 %

## 2018-09-16 NOTE — Patient Instructions (Signed)
Explained breast self awareness with Perlie Mayo. Patient did not need a Pap smear today due to patient has a history of a hysterectomy for benign reasons. Let patient know that she doesn't need any further Pap smears due to her history of a hysterectomy for benign reasons. Referred patient to the [facility] for a diagnostic mammogram and right breast ultrasound. Appointment scheduled for Wednesday, September 17, 2018 at 0930. Patient aware of appointment and will be there. Perlie Mayo verbalized understanding.  Onofre Gains, Arvil Chaco, RN 10:21 AM

## 2018-09-17 ENCOUNTER — Ambulatory Visit
Admission: RE | Admit: 2018-09-17 | Discharge: 2018-09-17 | Disposition: A | Discharge status: Home / Self Care | Payer: No Typology Code available for payment source | Source: Ambulatory Visit | Attending: Obstetrics and Gynecology | Admitting: Obstetrics and Gynecology

## 2018-09-17 DIAGNOSIS — N6310 Unspecified lump in the right breast, unspecified quadrant: Secondary | ICD-10-CM

## 2018-10-20 ENCOUNTER — Encounter (HOSPITAL_COMMUNITY): Payer: Self-pay | Admitting: *Deleted

## 2018-10-22 ENCOUNTER — Ambulatory Visit (INDEPENDENT_AMBULATORY_CARE_PROVIDER_SITE_OTHER): Payer: Self-pay | Admitting: Family Medicine

## 2018-10-22 ENCOUNTER — Encounter: Payer: Self-pay | Admitting: Family Medicine

## 2018-10-22 ENCOUNTER — Other Ambulatory Visit: Payer: Self-pay

## 2018-10-22 DIAGNOSIS — M2550 Pain in unspecified joint: Secondary | ICD-10-CM

## 2018-10-22 DIAGNOSIS — E039 Hypothyroidism, unspecified: Secondary | ICD-10-CM

## 2018-10-22 DIAGNOSIS — R220 Localized swelling, mass and lump, head: Secondary | ICD-10-CM

## 2018-10-22 DIAGNOSIS — F17200 Nicotine dependence, unspecified, uncomplicated: Secondary | ICD-10-CM

## 2018-10-22 DIAGNOSIS — E559 Vitamin D deficiency, unspecified: Secondary | ICD-10-CM

## 2018-10-22 DIAGNOSIS — L299 Pruritus, unspecified: Secondary | ICD-10-CM

## 2018-10-22 DIAGNOSIS — I10 Essential (primary) hypertension: Secondary | ICD-10-CM

## 2018-10-22 DIAGNOSIS — E781 Pure hyperglyceridemia: Secondary | ICD-10-CM

## 2018-10-22 MED ORDER — IBUPROFEN 800 MG PO TABS: ORAL_TABLET | ORAL | 0 refills | Status: DC

## 2018-10-22 MED ORDER — ERGOCALCIFEROL 1.25 MG (50000 UT) PO CAPS: 50000.0000 [IU] | ORAL_CAPSULE | ORAL | 5 refills | Status: DC

## 2018-10-22 MED ORDER — HYDROXYZINE HCL 25 MG PO TABS: 25.0000 mg | ORAL_TABLET | Freq: Three times a day (TID) | ORAL | 0 refills | Status: DC | PRN

## 2018-10-22 NOTE — Progress Notes (Signed)
Patient [facility]  Virtual Visit via Telephone Note  I connected with Cheryl Thompson on 10/22/18 at 10:20 AM EDT by telephone and verified that I am speaking with the correct person using two identifiers.   I discussed the limitations, risks, security and privacy concerns of performing an evaluation and management service by telephone and the availability of in person appointments. I also discussed with the patient that there may be a patient responsible charge related to this service. The patient expressed understanding and agreed to proceed.   History of Present Illness: Cheryl Thompson  has a past medical history of Acute alcoholic hepatitis, Depression, Diverticulitis (y-1), Diverticulosis (y-1), Hepatitis C, HIV (human immunodeficiency virus infection) ([place]), Hypertension, Seizures ([place]), Stroke ([place]), and Thyroid disease. Patient states that she is in pain today due to her neuropathy. She is out of ibuprofen and is requesting a refill. She states that she is having itching all over her body and feels that something is crawling under her skin. She denies a rash or skin eruption at the present time. Currently being prescribed Cymbalta by ID provider. Has not been to psych in a year. She denies psychosis, delusional thinking, suicidal/homicidal ideations, intent, or plan. She also states that she has been taking her levothyroxine and feels that she is starting to gain weight. Her last TSH was abnormal. Patient states that she is now consistent with her medications. Would like levels repeated.  She is followed by neurology for seizure disorder. Denies seizures in the past 6 months, however, she states that she is starting to "trip over my feet" more often than she used to. She has a neurologist that she will follow up with in the next few months.     Observations/Objective: Patient with regular voice tone, rate and rhythm. Speaking calmly and is in no  apparent distress.    Assessment and Plan:  1. Swelling of left side of face  2. Vitamin D deficiency - ergocalciferol (VITAMIN D2) 1.25 MG (50000 UT) capsule; Take 1 capsule (50,000 Units total) by mouth once a week.  Dispense: 12 capsule; Refill: 5  3. Essential hypertension, benign  4. Hypothyroidism, unspecified type - Thyroid Panel With TSH; Future Will repeat thyroid panel  5. Tobacco dependence Patient using 2 cigs per day. Encouraged to quit. Praised for decrease.  Smoking cessation instruction/counseling given:  counseled patient on the dangers of tobacco use, advised patient to stop smoking, and reviewed strategies to maximize success  6. Hypertriglyceridemia  7. Itching - hydrOXYzine (ATARAX/VISTARIL) 25 MG tablet; Take 1 tablet (25 mg total) by mouth 3 (three) times daily as needed.  Dispense: 30 tablet; Refill: 0  8. Polyarthralgia - ibuprofen (ADVIL,MOTRIN) 800 MG tablet; TAKE 1 TABLET(800 MG) BY MOUTH EVERY 8 HOURS AS NEEDED.  Dispense: 30 tablet; Refill: 0   Follow Up Instructions:   We discussed hand washing, using hand sanitizer when soap and water are not available, only going out when absolutely necessary, and social distancing. Explained to patient that she is immunocompromised and will need to take precautions during this time.   I discussed the assessment and treatment plan with the patient. The patient was provided an opportunity to ask questions and all were answered. The patient agreed with the plan and demonstrated an understanding of the instructions.   The patient was advised to call back or seek an in-person evaluation if the symptoms worsen or if the condition fails to improve as anticipated.  I  provided 15 minutes of non-face-to-face time during this encounter.  Ms. Andr L. Nathaneil Canary, FNP-BC Patient [facility] [address] [place], [place] [address] [phone]

## 2018-11-10 ENCOUNTER — Other Ambulatory Visit: Payer: Self-pay | Admitting: Internal Medicine

## 2018-11-10 DIAGNOSIS — B20 Human immunodeficiency virus [HIV] disease: Secondary | ICD-10-CM

## 2018-12-09 ENCOUNTER — Ambulatory Visit (HOSPITAL_BASED_OUTPATIENT_CLINIC_OR_DEPARTMENT_OTHER)
Admission: RE | Admit: 2018-12-09 | Discharge: 2018-12-09 | Disposition: A | Discharge status: Home / Self Care | Payer: No Typology Code available for payment source | Source: Ambulatory Visit | Attending: Family Medicine | Admitting: Family Medicine

## 2018-12-09 ENCOUNTER — Ambulatory Visit: Payer: Self-pay

## 2018-12-09 ENCOUNTER — Other Ambulatory Visit: Payer: Self-pay

## 2018-12-09 ENCOUNTER — Encounter: Payer: Self-pay | Admitting: Family Medicine

## 2018-12-09 ENCOUNTER — Ambulatory Visit (INDEPENDENT_AMBULATORY_CARE_PROVIDER_SITE_OTHER): Payer: Self-pay | Admitting: Family Medicine

## 2018-12-09 VITALS — BP 126/82 | HR 83 | Ht 69.0 in

## 2018-12-09 DIAGNOSIS — M25561 Pain in right knee: Secondary | ICD-10-CM | POA: Insufficient documentation

## 2018-12-09 DIAGNOSIS — M11261 Other chondrocalcinosis, right knee: Secondary | ICD-10-CM | POA: Insufficient documentation

## 2018-12-09 MED ORDER — METHYLPREDNISOLONE ACETATE 40 MG/ML IJ SUSP
40.0000 mg | Freq: Once | INTRAMUSCULAR | Status: AC
  Administered 2018-12-09: 40 mg via INTRA_ARTICULAR

## 2018-12-09 NOTE — Patient Instructions (Signed)
Nice to meet you Please try ice for your knee  Please continue the cane   Please send me a message in MyChart with any questions or updates.  Please see me back in 4 weeks.   --Dr. Raeford Razor
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
## 2018-12-09 NOTE — Assessment & Plan Note (Signed)
Could be related to underlying arthritis or degenerative meniscus.  Exam was limited today based on her pain. -Aspiration injection today. -X-ray. -Counseled on home exercise therapy and supportive care. -If no improvement will consider physical therapy or try bracing.

## 2018-12-10 ENCOUNTER — Telehealth: Payer: Self-pay | Admitting: Family Medicine

## 2018-12-10 NOTE — Telephone Encounter (Signed)
Left VM for patient. If she calls back please have her speak with a nurse/CMA and inform that her right knee degenerative changes or possibly pseudogout. The left knee has more advanced degenerative changes on the inside part of the knee. The [facility] can report results to patient.   If any questions then please take the best time and phone number to call and I will try to call her back.   Rosemarie Ax, MD [facility] 12/10/2018, 8:36 AM

## 2018-12-11 ENCOUNTER — Telehealth: Payer: Self-pay | Admitting: Family Medicine

## 2018-12-11 NOTE — Telephone Encounter (Signed)
-----   Message from Ophelia Shoulder, [place] sent at 12/10/2018 11:57 AM EDT ----- Called patient and gave her the information provided and she does have some questions. Could you please contact her at [phone].  Thanks! Nevin Bloodgood

## 2018-12-11 NOTE — Telephone Encounter (Signed)
Answered patient's questions.   Rosemarie Ax, MD [facility] 12/11/2018, 3:32 PM

## 2018-12-22 ENCOUNTER — Telehealth: Payer: Self-pay | Admitting: Family Medicine

## 2018-12-22 NOTE — Telephone Encounter (Signed)
Patient called, stated she is still in extreme pain.  She is working on getting physical therapy set up but asked what kind of bracing she could do.

## 2018-12-23 ENCOUNTER — Other Ambulatory Visit: Payer: Self-pay

## 2018-12-23 DIAGNOSIS — M2550 Pain in unspecified joint: Secondary | ICD-10-CM

## 2018-12-23 MED ORDER — IBUPROFEN 800 MG PO TABS: ORAL_TABLET | ORAL | 0 refills | Status: DC

## 2018-12-23 NOTE — Telephone Encounter (Signed)
Spoke with patient about pain. Can provide samples of duexis and pennsiad. Will try knee brace.   Rosemarie Ax, MD [facility] 12/23/2018, 1:47 PM

## 2018-12-26 MED ORDER — IBUPROFEN 800 MG PO TABS: ORAL_TABLET | ORAL | 0 refills | Status: DC

## 2018-12-26 NOTE — Addendum Note (Signed)
Addended by: Genelle Bal on: 12/26/2018 01:25 PM   Modules accepted: Orders

## 2018-12-31 ENCOUNTER — Other Ambulatory Visit: Payer: Self-pay

## 2018-12-31 ENCOUNTER — Encounter: Payer: Self-pay | Admitting: Family Medicine

## 2018-12-31 ENCOUNTER — Ambulatory Visit (INDEPENDENT_AMBULATORY_CARE_PROVIDER_SITE_OTHER): Payer: Self-pay | Admitting: Family Medicine

## 2018-12-31 VITALS — BP 145/88 | HR 90 | Temp 98.9°F | Resp 16 | Ht 69.0 in | Wt 231.0 lb

## 2018-12-31 DIAGNOSIS — L309 Dermatitis, unspecified: Secondary | ICD-10-CM

## 2018-12-31 MED ORDER — METHYLPREDNISOLONE SODIUM SUCC 125 MG IJ SOLR
125.0000 mg | Freq: Once | INTRAMUSCULAR | Status: AC
  Administered 2018-12-31: 125 mg via INTRAMUSCULAR

## 2018-12-31 MED ORDER — CLOTRIMAZOLE-BETAMETHASONE 1-0.05 % EX CREA: 1.0000 "application " | TOPICAL_CREAM | Freq: Two times a day (BID) | CUTANEOUS | 0 refills | Status: DC

## 2018-12-31 NOTE — Progress Notes (Signed)
  Patient [facility]   Progress Note: Sick Visit Provider: Lanae Boast, FNP  SUBJECTIVE:   Cheryl Thompson is a 52 y.o. female who  has a past medical history of Acute alcoholic hepatitis, Depression, Diverticulitis (y-1), Diverticulosis (y-1), Hepatitis C, HIV (human immunodeficiency virus infection) ([place]), Hypertension, Seizures ([place]), Stroke ([place]), and Thyroid disease.. Patient presents today for Rash (arms ) and Pruritis (on arms ) Rash This is a new problem. The current episode started 1 to 4 weeks ago. The problem has been gradually worsening since onset. The affected locations include the left arm, right arm, right hand, right wrist, left hand, left wrist, left fingers and right fingers. The rash is characterized by itchiness and dryness. She was exposed to nothing. Pertinent negatives include no congestion, cough, rhinorrhea or shortness of breath. Past treatments include antihistamine and topical steroids. The treatment provided mild relief. Her past medical history is significant for allergies.    Review of Systems  HENT: Negative for congestion and rhinorrhea.   Respiratory: Negative for cough and shortness of breath.   Skin: Positive for rash.  All other systems reviewed and are negative.    OBJECTIVE: BP (!) 145/88 (BP Location: Left Arm, Patient Position: Sitting, Cuff Size: Large)   Pulse 90   Temp 98.9 F (37.2 C) (Oral)   Resp 16   Ht 5\' 9"  (1.753 m)   Wt 231 lb (104.8 kg)   SpO2 100%   BMI 34.11 kg/m   Wt Readings from Last 3 Encounters:  12/31/18 231 lb (104.8 kg)  09/16/18 227 lb (103 kg)  09/09/18 228 lb (103.4 kg)     Physical Exam Constitutional:      General: She is not in acute distress.    Appearance: Normal appearance. She is normal weight.  HENT:     Head: Normocephalic and atraumatic.     Mouth/Throat:     Mouth: Mucous membranes are moist.  Eyes:     Extraocular Movements: Extraocular movements  intact.  Neck:     Musculoskeletal: Normal range of motion.  Cardiovascular:     Pulses: Normal pulses.  Pulmonary:     Effort: Pulmonary effort is normal.  Musculoskeletal: Normal range of motion.  Skin:    Findings: Rash present. Rash is papular.  Neurological:     Mental Status: She is alert and oriented to person, place, and time.  Psychiatric:        Mood and Affect: Mood normal.        Behavior: Behavior normal.        Thought Content: Thought content normal.        Judgment: Judgment normal.         ASSESSMENT/PLAN:   1. Dermatitis Continue with vistaril as needed.  - methylPREDNISolone sodium succinate (SOLU-MEDROL) 125 mg/2 mL injection 125 mg - clotrimazole-betamethasone (LOTRISONE) cream; Apply 1 application topically 2 (two) times daily.  Dispense: 30 g; Refill: 0        The patient was given clear instructions to go to ER or return to medical center if symptoms do not improve, worsen or new problems develop. The patient verbalized understanding and agreed with plan of care.   [name], FNP-BC [facility] [address] [place],  [address] [phone]     This note has been created with Dragon speech recognition software and smart phrase technology. Any transcriptional errors are unintentional.

## 2018-12-31 NOTE — Patient Instructions (Signed)
Dermatitis Dermatitis is redness, soreness, and swelling (inflammation) of the skin. Contact dermatitis is a reaction to something that touches the skin. There are two types of contact dermatitis:  Irritant contact dermatitis. This happens when something bothers (irritates) your skin, like soap.  Allergic contact dermatitis. This is caused when you are exposed to something that you are allergic to, such as poison ivy. What are the causes?  Common causes of irritant contact dermatitis include: ? Makeup. ? Soaps. ? Detergents. ? Bleaches. ? Acids. ? Metals, such as nickel.  Common causes of allergic contact dermatitis include: ? Plants. ? Chemicals. ? Jewelry. ? Latex. ? Medicines. ? Preservatives in products, such as clothing. What increases the risk?  Having a job that exposes you to things that bother your skin.  Having asthma or eczema. What are the signs or symptoms? Symptoms may happen anywhere the irritant has touched your skin. Symptoms include:  Dry or flaky skin.  Redness.  Cracks.  Itching.  Pain or a burning feeling.  Blisters.  Blood or clear fluid draining from skin cracks. With allergic contact dermatitis, swelling may occur. This may happen in places such as the eyelids, mouth, or genitals. How is this treated?  This condition is treated by checking for the cause of the reaction and protecting your skin. Treatment may also include: ? Steroid creams, ointments, or medicines. ? Antibiotic medicines or other ointments, if you have a skin infection. ? Lotion or medicines to help with itching. ? A bandage (dressing). Follow these instructions at home: Skin care  Moisturize your skin as needed.  Put cool cloths on your skin.  Put a baking soda paste on your skin. Stir water into baking soda until it looks like a paste.  Do not scratch your skin.  Avoid having things rub up against your skin.  Avoid the use of soaps, perfumes, and dyes.  Medicines  Take or apply over-the-counter and prescription medicines only as told by your doctor.  If you were prescribed an antibiotic medicine, take or apply it as told by your doctor. Do not stop using it even if your condition starts to get better. Bathing  Take a bath with: ? Epsom salts. ? Baking soda. ? Colloidal oatmeal.  Bathe less often.  Bathe in warm water. Avoid using hot water. Bandage care  If you were given a bandage, change it as told by your health care provider.  Wash your hands with soap and water before and after you change your bandage. If soap and water are not available, use hand sanitizer. General instructions  Avoid the things that caused your reaction. If you do not know what caused it, keep a journal. Write down: ? What you eat. ? What skin products you use. ? What you drink. ? What you wear in the area that has symptoms. This includes jewelry.  Check the affected areas every day for signs of infection. Check for: ? More redness, swelling, or pain. ? More fluid or blood. ? Warmth. ? Pus or a bad smell.  Keep all follow-up visits as told by your doctor. This is important. Contact a doctor if:  You do not get better with treatment.  Your condition gets worse.  You have signs of infection, such as: ? More swelling. ? Tenderness. ? More redness. ? Soreness. ? Warmth.  You have a fever.  You have new symptoms. Get help right away if:  You have a very bad headache.  You have neck pain.  Your neck is stiff.  You throw up (vomit).  You feel very sleepy.  You see red streaks coming from the area.  Your bone or joint near the area hurts after the skin has healed.  The area turns darker.  You have trouble breathing. Summary  Dermatitis is redness, soreness, and swelling of the skin.  Symptoms may occur where the irritant has touched you.  Treatment may include medicines and skin care.  If you do not know what caused your  reaction, keep a journal.  Contact a doctor if your condition gets worse or you have signs of infection. This information is not intended to replace advice given to you by your health care provider. Make sure you discuss any questions you have with your health care provider. Document Released: 04/29/2009 Document Revised: 01/15/2018 Document Reviewed: 01/15/2018 Elsevier Interactive Patient Education  2019 [employer].

## 2019-01-06 ENCOUNTER — Encounter: Payer: Self-pay | Admitting: Family Medicine

## 2019-01-06 ENCOUNTER — Other Ambulatory Visit: Payer: Self-pay

## 2019-01-06 ENCOUNTER — Ambulatory Visit (INDEPENDENT_AMBULATORY_CARE_PROVIDER_SITE_OTHER): Payer: Self-pay | Admitting: Family Medicine

## 2019-01-06 VITALS — BP 142/87 | HR 86 | Ht 69.0 in | Wt 230.0 lb

## 2019-01-06 DIAGNOSIS — M222X2 Patellofemoral disorders, left knee: Secondary | ICD-10-CM

## 2019-01-06 DIAGNOSIS — M25561 Pain in right knee: Secondary | ICD-10-CM

## 2019-01-06 DIAGNOSIS — M222X1 Patellofemoral disorders, right knee: Secondary | ICD-10-CM

## 2019-01-06 NOTE — Assessment & Plan Note (Signed)
Pain has improved. Still has some at baseline.  - counseled on HEp and supportive care - could consider gel injections or PT.
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
## 2019-01-06 NOTE — Patient Instructions (Signed)
Good to see you Please try ice and compression  Please try the exercises   Take tylenol 650 mg three times a day is the best evidence based medicine we have for arthritis.  Glucosamine sulfate 750mg  twice a day is a supplement that has been shown to help moderate to severe arthritis. Vitamin D 2000 IU daily Fish oil 2 grams daily.  Tumeric 500mg  twice daily.  Capsaicin topically up to four times a day may also help with pain.   Please send me a message in MyChart with any questions or updates.  Please see me back in 6 weeks.   --Dr. Raeford Razor

## 2019-01-21 ENCOUNTER — Other Ambulatory Visit: Payer: Self-pay

## 2019-01-21 ENCOUNTER — Ambulatory Visit (INDEPENDENT_AMBULATORY_CARE_PROVIDER_SITE_OTHER): Payer: Self-pay | Admitting: Family Medicine

## 2019-01-21 ENCOUNTER — Encounter: Payer: Self-pay | Admitting: Family Medicine

## 2019-01-21 VITALS — BP 131/80 | HR 81 | Temp 98.5°F | Resp 14 | Ht 69.0 in | Wt 231.0 lb

## 2019-01-21 DIAGNOSIS — I10 Essential (primary) hypertension: Secondary | ICD-10-CM

## 2019-01-21 DIAGNOSIS — G40909 Epilepsy, unspecified, not intractable, without status epilepticus: Secondary | ICD-10-CM

## 2019-01-21 DIAGNOSIS — E039 Hypothyroidism, unspecified: Secondary | ICD-10-CM

## 2019-01-21 DIAGNOSIS — R7303 Prediabetes: Secondary | ICD-10-CM

## 2019-01-21 LAB — POCT GLYCOSYLATED HEMOGLOBIN (HGB A1C): Hemoglobin A1C: 6.1 % — AB (ref 4.0–5.6)

## 2019-01-21 MED ORDER — METFORMIN HCL 500 MG PO TABS: 500.0000 mg | ORAL_TABLET | Freq: Every day | ORAL | 1 refills | Status: DC

## 2019-01-21 NOTE — Patient Instructions (Signed)
Hypothyroidism  Hypothyroidism is when the thyroid gland does not make enough of certain hormones (it is underactive). The thyroid gland is a small gland located in the lower front part of the neck, just in front of the windpipe (trachea). This gland makes hormones that help control how the body uses food for energy (metabolism) as well as how the heart and brain function. These hormones also play a role in keeping your bones strong. When the thyroid is underactive, it produces too little of the hormones thyroxine (T4) and triiodothyronine (T3). What are the causes? This condition may be caused by:  Hashimoto's disease. This is a disease in which the body's disease-fighting system (immune system) attacks the thyroid gland. This is the most common cause.  Viral infections.  Pregnancy.  Certain medicines.  Birth defects.  Past radiation treatments to the head or neck for cancer.  Past treatment with radioactive iodine.  Past exposure to radiation in the environment.  Past surgical removal of part or all of the thyroid.  Problems with a gland in the center of the brain (pituitary gland).  Lack of enough iodine in the diet. What increases the risk? You are more likely to develop this condition if:  You are female.  You have a family history of thyroid conditions.  You use a medicine called lithium.  You take medicines that affect the immune system (immunosuppressants). What are the signs or symptoms? Symptoms of this condition include:  Feeling as though you have no energy (lethargy).  Not being able to tolerate cold.  Weight gain that is not explained by a change in diet or exercise habits.  Lack of appetite.  Dry skin.  Coarse hair.  Menstrual irregularity.  Slowing of thought processes.  Constipation.  Sadness or depression. How is this diagnosed? This condition may be diagnosed based on:  Your symptoms, your medical history, and a physical exam.  Blood  tests. You may also have imaging tests, such as an ultrasound or MRI. How is this treated? This condition is treated with medicine that replaces the thyroid hormones that your body does not make. After you begin treatment, it may take several weeks for symptoms to go away. Follow these instructions at home:  Take over-the-counter and prescription medicines only as told by your health care provider.  If you start taking any new medicines, tell your health care provider.  Keep all follow-up visits as told by your health care provider. This is important. ? As your condition improves, your dosage of thyroid hormone medicine may change. ? You will need to have blood tests regularly so that your health care provider can monitor your condition. Contact a health care provider if:  Your symptoms do not get better with treatment.  You are taking thyroid replacement medicine and you: ? Sweat a lot. ? Have tremors. ? Feel anxious. ? Lose weight rapidly. ? Cannot tolerate heat. ? Have emotional swings. ? Have diarrhea. ? Feel weak. Get help right away if you have:  Chest pain.  An irregular heartbeat.  A rapid heartbeat.  Difficulty breathing. Summary  Hypothyroidism is when the thyroid gland does not make enough of certain hormones (it is underactive).  When the thyroid is underactive, it produces too little of the hormones thyroxine (T4) and triiodothyronine (T3).  The most common cause is Hashimoto's disease, a disease in which the body's disease-fighting system (immune system) attacks the thyroid gland. The condition can also be caused by viral infections, medicine, pregnancy, or past  radiation treatment to the head or neck.  Symptoms may include weight gain, dry skin, constipation, feeling as though you do not have energy, and not being able to tolerate cold.  This condition is treated with medicine to replace the thyroid hormones that your body does not make. This information  is not intended to replace advice given to you by your health care provider. Make sure you discuss any questions you have with your health care provider. Document Released: 07/02/2005 Document Revised: 06/14/2017 Document Reviewed: 06/12/2017 Elsevier Patient Education  2020 [employer]. Prediabetes Prediabetes is the condition of having a blood sugar (blood glucose) level that is higher than it should be, but not high enough for you to be diagnosed with type 2 diabetes. Having prediabetes puts you at risk for developing type 2 diabetes (type 2 diabetes mellitus). Prediabetes may be called impaired glucose tolerance or impaired fasting glucose. Prediabetes usually does not cause symptoms. Your health care provider can diagnose this condition with blood tests. You may be tested for prediabetes if you are overweight and if you have at least one other risk factor for prediabetes. What is blood glucose, and how is it measured? Blood glucose refers to the amount of glucose in your bloodstream. Glucose comes from eating foods that contain sugars and starches (carbohydrates), which the body breaks down into glucose. Your blood glucose level may be measured in mg/dL (milligrams per deciliter) or mmol/L (millimoles per liter). Your blood glucose may be checked with one or more of the following blood tests:  A fasting blood glucose (FBG) test. You will not be allowed to eat (you will fast) for 8 hours or longer before a blood sample is taken. ? A normal range for FBG is 70-100 mg/dl (3.9-5.6 mmol/L).  An A1c (hemoglobin A1c) blood test. This test provides information about blood glucose control over the previous 2?56months.  An oral glucose tolerance test (OGTT). This test measures your blood glucose at two times: ? After fasting. This is your baseline level. ? Two hours after you drink a beverage that contains glucose. You may be diagnosed with prediabetes:  If your FBG is 100?125 mg/dL (5.6-6.9  mmol/L).  If your A1c level is 5.7?6.4%.  If your OGTT result is 140?199 mg/dL (7.8-11 mmol/L). These blood tests may be repeated to confirm your diagnosis. How can this condition affect me? The pancreas produces a hormone (insulin) that helps to move glucose from the bloodstream into cells. When cells in the body do not respond properly to insulin that the body makes (insulin resistance), excess glucose builds up in the blood instead of going into cells. As a result, high blood glucose (hyperglycemia) can develop, which can cause many complications. Hyperglycemia is a symptom of prediabetes. Having high blood glucose for a long time is dangerous. Too much glucose in your blood can damage your nerves and blood vessels. Long-term damage can lead to complications from diabetes, which may include:  Heart disease.  Stroke.  Blindness.  Kidney disease.  Depression.  Poor circulation in the feet and legs, which could lead to surgical removal (amputation) in severe cases. What can increase my risk? Risk factors for prediabetes include:  Having a family member with type 2 diabetes.  Being overweight or obese.  Being older than age 57.  Being of American [place], African-American, Hispanic/Latino, or Asian/Pacific Islander descent.  Having an inactive (sedentary) lifestyle.  Having a history of heart disease.  History of gestational diabetes or polycystic ovary syndrome (PCOS), in women.  Having low levels of good cholesterol (HDL-C) or high levels of blood fats (triglycerides).  Having high blood pressure. What actions can I take to prevent diabetes?      Be physically active. ? Do moderate-intensity physical activity for 30 or more minutes on 5 or more days of the week, or as much as told by your health care provider. This could be brisk walking, biking, or water aerobics. ? Ask your health care provider what activities are safe for you. A mix of physical activities may be  best, such as walking, swimming, cycling, and strength training.  Lose weight as told by your health care provider. ? Losing 5-7% of your body weight can reverse insulin resistance. ? Your health care provider can determine how much weight loss is best for you and can help you lose weight safely.  Follow a healthy meal plan. This includes eating lean proteins, complex carbohydrates, fresh fruits and vegetables, low-fat dairy products, and healthy fats. ? Follow instructions from your health care provider about eating or drinking restrictions. ? Make an appointment to see a diet and nutrition specialist (registered dietitian) to help you create a healthy eating plan that is right for you.  Do not smoke or use any tobacco products, such as cigarettes, chewing tobacco, and e-cigarettes. If you need help quitting, ask your health care provider.  Take over-the-counter and prescription medicines as told by your health care provider. You may be prescribed medicines that help lower the risk of type 2 diabetes.  Keep all follow-up visits as told by your health care provider. This is important. Summary  Prediabetes is the condition of having a blood sugar (blood glucose) level that is higher than it should be, but not high enough for you to be diagnosed with type 2 diabetes.  Having prediabetes puts you at risk for developing type 2 diabetes (type 2 diabetes mellitus).  To help prevent type 2 diabetes, make lifestyle changes such as being physically active and eating a healthy diet. Lose weight as told by your health care provider. This information is not intended to replace advice given to you by your health care provider. Make sure you discuss any questions you have with your health care provider. Document Released: 10/24/2015 Document Revised: 10/24/2018 Document Reviewed: 08/23/2015 Elsevier Patient Education  2020 [employer].

## 2019-01-21 NOTE — Progress Notes (Signed)
[facility]   Progress Note: General Provider: Lanae Boast, FNP  SUBJECTIVE:   Cheryl Thompson is a 53 y.o. female who  has a past medical history of Acute alcoholic hepatitis, Depression, Diverticulitis (y-1), Diverticulosis (y-1), Hepatitis C, HIV (human immunodeficiency virus infection) ([place]), Hypertension, Seizures ([place]), Stroke ([place]), and Thyroid disease.. Patient presents today for Hypertension, Hypothyroidism, and Seizures Patient states that since her last visit, she has been seen by Ortho for knee pain.  She states that she is wearing a brace daily, continues to have intermittent falls, and muscle atrophy.  She denies any recent seizures.  She is concerned that her thyroid medication needs to be adjusted due to recent weight gain.  She is prediabetic and would like to have her A1c tested today.  Has used metformin in the past without side effect.  Also needs DMV handicap placard paperwork filled out.  Review of Systems  Constitutional: Negative.   HENT: Negative.   Eyes: Negative.   Respiratory: Negative.   Cardiovascular: Negative.   Gastrointestinal: Negative.   Genitourinary: Negative.   Musculoskeletal: Positive for joint pain.  Skin: Negative.   Neurological: Positive for weakness.  Psychiatric/Behavioral: Negative.      OBJECTIVE: BP 131/80 (BP Location: Left Arm, Patient Position: Sitting, Cuff Size: Normal)   Pulse 81   Temp 98.5 F (36.9 C) (Oral)   Resp 14   Ht 5\' 9"  (1.753 m)   Wt 231 lb (104.8 kg)   SpO2 100%   BMI 34.11 kg/m   Wt Readings from Last 3 Encounters:  01/21/19 231 lb (104.8 kg)  01/06/19 230 lb (104.3 kg)  12/31/18 231 lb (104.8 kg)     Physical Exam Vitals signs and nursing note reviewed.  Constitutional:      General: She is not in acute distress.    Appearance: She is well-developed.  HENT:     Head: Normocephalic and atraumatic.  Eyes:     Conjunctiva/sclera: Conjunctivae  normal.     Pupils: Pupils are equal, round, and reactive to light.  Neck:     Musculoskeletal: Normal range of motion.  Cardiovascular:     Rate and Rhythm: Normal rate and regular rhythm.     Heart sounds: Normal heart sounds.  Pulmonary:     Effort: Pulmonary effort is normal. No respiratory distress.     Breath sounds: Normal breath sounds.  Abdominal:     General: Bowel sounds are normal. There is no distension.     Palpations: Abdomen is soft.  Musculoskeletal: Normal range of motion.  Skin:    General: Skin is warm and dry.  Neurological:     Mental Status: She is alert and oriented to person, place, and time. Mental status is at baseline.     Gait: Gait abnormal (Ambulates with cane).  Psychiatric:        Mood and Affect: Mood normal.        Behavior: Behavior normal.        Thought Content: Thought content normal.        Judgment: Judgment normal.     ASSESSMENT/PLAN:  1. Essential hypertension, benign - Urinalysis Dipstick - Comprehensive metabolic panel  2. Prediabetes Discussed diet and exercise.  Patient would like to restart metformin. - HgB A1c - metFORMIN (GLUCOPHAGE) 500 MG tablet; Take 1 tablet (500 mg total) by mouth daily with breakfast.  Dispense: 90 tablet; Refill: 1  3. Seizure disorder ([place]) No medication changes.  4. Hypothyroidism, unspecified type Labs ordered today.  Will adjust medications as warranted. - Thyroid Panel With TSH - US Soft Tissue Head/Neck; Future    Return in about 3 months (around 04/23/2019) for htn.    The patient was given clear instructions to go to ER or return to medical center if symptoms do not improve, worsen or new problems develop. The patient verbalized understanding and agreed with plan of care.   [name], FNP-BC [facility] [address] [place], [place] [address] [phone]

## 2019-01-22 LAB — COMPREHENSIVE METABOLIC PANEL
ALT: 19 IU/L (ref 0–32)
AST: 39 IU/L (ref 0–40)
Albumin/Globulin Ratio: 0.8 — ABNORMAL LOW (ref 1.2–2.2)
Albumin: 3.4 g/dL — ABNORMAL LOW (ref 3.8–4.9)
Alkaline Phosphatase: 72 IU/L (ref 39–117)
BUN/Creatinine Ratio: 17 (ref 9–23)
BUN: 14 mg/dL (ref 6–24)
Bilirubin Total: <0.2 mg/dL (ref 0.0–1.2)
CO2: 23 mmol/L (ref 20–29)
Calcium: 9.2 mg/dL (ref 8.7–10.2)
Chloride: 99 mmol/L (ref 96–106)
Creatinine, Ser: 0.84 mg/dL (ref 0.57–1.00)
GFR calc Af Amer: 92 mL/min/{1.73_m2} (ref >59)
GFR calc non Af Amer: 80 mL/min/{1.73_m2} (ref >59)
Globulin, Total: 4.4 g/dL (ref 1.5–4.5)
Glucose: 89 mg/dL (ref 65–99)
Potassium: 3.6 mmol/L (ref 3.5–5.2)
Sodium: 139 mmol/L (ref 134–144)
Total Protein: 7.8 g/dL (ref 6.0–8.5)

## 2019-01-22 LAB — THYROID PANEL WITH TSH
Free Thyroxine Index: 1.4 (ref 1.2–4.9)
T3 Uptake Ratio: 20 % — ABNORMAL LOW (ref 24–39)
T4, Total: 6.8 ug/dL (ref 4.5–12.0)
TSH: 6.31 u[IU]/mL — ABNORMAL HIGH (ref 0.450–4.500)

## 2019-01-23 ENCOUNTER — Telehealth: Payer: Self-pay

## 2019-01-23 DIAGNOSIS — E039 Hypothyroidism, unspecified: Secondary | ICD-10-CM

## 2019-01-23 NOTE — Telephone Encounter (Signed)
Radiology scheduling said [place] ordered for patient needs to be changed to Ultrasound of Thyroid. Can you please update this?

## 2019-01-29 ENCOUNTER — Other Ambulatory Visit: Payer: Self-pay

## 2019-01-29 ENCOUNTER — Ambulatory Visit (HOSPITAL_COMMUNITY)
Admission: RE | Admit: 2019-01-29 | Discharge: 2019-01-29 | Disposition: A | Discharge status: Home / Self Care | Payer: Self-pay | Source: Ambulatory Visit | Attending: Family Medicine | Admitting: Family Medicine

## 2019-01-29 DIAGNOSIS — E039 Hypothyroidism, unspecified: Secondary | ICD-10-CM | POA: Insufficient documentation

## 2019-01-30 ENCOUNTER — Telehealth: Payer: Self-pay

## 2019-01-30 NOTE — Telephone Encounter (Signed)
-----   Message from Lanae Boast, [place] sent at 01/30/2019  8:18 AM EDT ----- Your thyroid ultrasound was normal. There were no nodules seen.

## 2019-01-30 NOTE — Telephone Encounter (Signed)
Called and spoke with patient. Advised that ultrasound was normal and no nodules were seen. Thanks!

## 2019-02-02 ENCOUNTER — Encounter: Payer: Self-pay | Admitting: Family Medicine

## 2019-02-09 ENCOUNTER — Telehealth: Payer: Self-pay

## 2019-02-09 MED ORDER — LEVOTHYROXINE SODIUM 175 MCG PO TABS: 175.0000 ug | ORAL_TABLET | Freq: Every day | ORAL | 1 refills | Status: DC

## 2019-02-09 NOTE — Addendum Note (Signed)
Addended by: Genelle Bal on: 02/09/2019 08:25 AM   Modules accepted: Orders

## 2019-02-09 NOTE — Telephone Encounter (Signed)
Called, no answer. Left a message to call back regarding labs. Thanks!

## 2019-02-09 NOTE — Telephone Encounter (Signed)
-----   Message from Lanae Boast, [place] sent at 02/09/2019  8:25 AM EDT ----- TSH has improved, but continues to be elevated. I would like to increase the levothyroxine to 193mcg daily. I will send this script to the pharmacy. Re-check levels in 6 weeks.

## 2019-02-10 NOTE — Telephone Encounter (Signed)
Called and spoke with patient, advised that thyroid was out of range and we need to adjust thyroid medication dosage. She was informed to stop current dose and to start taking 158mcg once daily. Advised rx was sent to pharmacy and she should come back in 6 week for recheck. Thanks!

## 2019-02-17 ENCOUNTER — Other Ambulatory Visit: Payer: Self-pay

## 2019-02-17 ENCOUNTER — Encounter: Payer: Self-pay | Admitting: Family Medicine

## 2019-02-17 ENCOUNTER — Ambulatory Visit (INDEPENDENT_AMBULATORY_CARE_PROVIDER_SITE_OTHER): Payer: Self-pay | Admitting: Family Medicine

## 2019-02-17 DIAGNOSIS — M11261 Other chondrocalcinosis, right knee: Secondary | ICD-10-CM

## 2019-02-17 NOTE — Patient Instructions (Signed)
Good to see you Please try the duexis. You can take this once a day or two times daily to get it to last longer  Please try ice  Please try tylenol   Please send me a message in MyChart with any questions or updates.  Please let us know when your insurance kicks in and we can try gel injections.   --Dr. Raeford Razor
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
## 2019-02-17 NOTE — Progress Notes (Signed)
Medication Samples have been provided to the patient.  Drug name: Duexis       Strength: 800mg /26.6mg        Qty: 3 Boxes  LOT: [id number]  Exp.Date: 05/2020  Dosing instructions: Take 1 tablet by mouth three (3) times a day.  The patient has been instructed regarding the correct time, dose, and frequency of taking this medication, including desired effects and most common side effects.   Sherrie George, MA 11:40 AM 02/17/2019

## 2019-02-17 NOTE — Assessment & Plan Note (Signed)
Has had limited improvement with previous steroid injections.  Findings on x-ray would suggest pseudogout.  Has good response with Duexis. -Duexis samples. -When she has insurance we can try gel injections. -Previous uric acid has been below 6.  May need to obtain another or run synovial fluid for analysis.

## 2019-02-23 ENCOUNTER — Other Ambulatory Visit: Payer: Self-pay | Admitting: *Deleted

## 2019-02-23 DIAGNOSIS — Z21 Asymptomatic human immunodeficiency virus [HIV] infection status: Secondary | ICD-10-CM

## 2019-03-05 ENCOUNTER — Other Ambulatory Visit: Payer: Self-pay

## 2019-03-05 DIAGNOSIS — J40 Bronchitis, not specified as acute or chronic: Secondary | ICD-10-CM

## 2019-03-05 MED ORDER — ALBUTEROL SULFATE HFA 108 (90 BASE) MCG/ACT IN AERS: 2.0000 | INHALATION_SPRAY | Freq: Four times a day (QID) | RESPIRATORY_TRACT | 3 refills | Status: DC | PRN

## 2019-03-06 ENCOUNTER — Telehealth: Payer: Self-pay | Admitting: Family Medicine

## 2019-03-06 DIAGNOSIS — K529 Noninfective gastroenteritis and colitis, unspecified: Secondary | ICD-10-CM | POA: Insufficient documentation

## 2019-03-06 DIAGNOSIS — F419 Anxiety disorder, unspecified: Secondary | ICD-10-CM | POA: Insufficient documentation

## 2019-03-06 DIAGNOSIS — F32A Depression, unspecified: Secondary | ICD-10-CM | POA: Insufficient documentation

## 2019-03-06 NOTE — Telephone Encounter (Signed)
Patient reports fever of 101.9, mild congestion, and cough. She has been using OTC medications with minimal relief of symptoms. She states that she is using her inhaler as prescribed. No signs or symptoms of respiratory distress reported. She is advised to report to ED or Urgent Care for further assessment. She will contact office on Monday, 03/09/2019 and follow up with PCP, Lanae Boast, NP as needed.

## 2019-03-09 MED ORDER — NICOTINE 14 MG/24HR TD PT24
1.00 | MEDICATED_PATCH | TRANSDERMAL | Status: DC
Start: 2019-03-09 — End: 2019-03-09

## 2019-03-09 MED ORDER — HYDROCHLOROTHIAZIDE 12.5 MG PO TABS
12.50 | ORAL_TABLET | ORAL | Status: DC
Start: 2019-03-09 — End: 2019-03-09

## 2019-03-09 MED ORDER — NITROGLYCERIN 0.4 MG SL SUBL
0.40 | SUBLINGUAL_TABLET | SUBLINGUAL | Status: DC
Start: ? — End: 2019-03-09

## 2019-03-09 MED ORDER — POTASSIUM CHLORIDE IN NACL 20-0.9 MEQ/L-% IV SOLN
100.00 | INTRAVENOUS | Status: DC
Start: ? — End: 2019-03-09

## 2019-03-09 MED ORDER — LORATADINE 10 MG PO TABS
10.00 | ORAL_TABLET | ORAL | Status: DC
Start: 2019-03-09 — End: 2019-03-09

## 2019-03-09 MED ORDER — ZOLPIDEM TARTRATE 5 MG PO TABS
10.00 | ORAL_TABLET | ORAL | Status: DC
Start: ? — End: 2019-03-09

## 2019-03-09 MED ORDER — DEXAMETHASONE 4 MG PO TABS
6.00 | ORAL_TABLET | ORAL | Status: DC
Start: 2019-03-09 — End: 2019-03-09

## 2019-03-09 MED ORDER — ENOXAPARIN SODIUM 40 MG/0.4ML ~~LOC~~ SOLN
40.00 | SUBCUTANEOUS | Status: DC
Start: 2019-03-09 — End: 2019-03-09

## 2019-03-09 MED ORDER — FISH OIL 1000 MG PO CAPS
1.00 | ORAL_CAPSULE | ORAL | Status: DC
Start: 2019-03-08 — End: 2019-03-09

## 2019-03-09 MED ORDER — GENERIC EXTERNAL MEDICATION
Status: DC
Start: ? — End: 2019-03-09

## 2019-03-09 MED ORDER — ZINC SULFATE 220 (50 ZN) MG PO CAPS
220.00 | ORAL_CAPSULE | ORAL | Status: DC
Start: 2019-03-09 — End: 2019-03-09

## 2019-03-09 MED ORDER — VITAMIN D3 25 MCG (1000 UNIT) PO TABS
1000.00 | ORAL_TABLET | ORAL | Status: DC
Start: 2019-03-09 — End: 2019-03-09

## 2019-03-09 MED ORDER — ALUM & MAG HYDROXIDE-SIMETH 200-200-20 MG/5ML PO SUSP
30.00 | ORAL | Status: DC
Start: ? — End: 2019-03-09

## 2019-03-09 MED ORDER — PRAVASTATIN SODIUM 20 MG PO TABS
20.00 | ORAL_TABLET | ORAL | Status: DC
Start: 2019-03-08 — End: 2019-03-09

## 2019-03-09 MED ORDER — CYCLOBENZAPRINE HCL 10 MG PO TABS
10.00 | ORAL_TABLET | ORAL | Status: DC
Start: ? — End: 2019-03-09

## 2019-03-09 MED ORDER — LEVETIRACETAM 500 MG PO TABS
500.00 | ORAL_TABLET | ORAL | Status: DC
Start: 2019-03-08 — End: 2019-03-09

## 2019-03-09 MED ORDER — PREGABALIN 25 MG PO CAPS
25.00 | ORAL_CAPSULE | ORAL | Status: DC
Start: 2019-03-08 — End: 2019-03-09

## 2019-03-09 MED ORDER — THERA PO TABS
1.00 | ORAL_TABLET | ORAL | Status: DC
Start: 2019-03-09 — End: 2019-03-09

## 2019-03-09 MED ORDER — LACTULOSE 10 GM/15ML PO SOLN
20.00 | ORAL | Status: DC
Start: 2019-03-08 — End: 2019-03-09

## 2019-03-09 MED ORDER — FENOFIBRATE 160 MG PO TABS
160.00 | ORAL_TABLET | ORAL | Status: DC
Start: 2019-03-09 — End: 2019-03-09

## 2019-03-09 MED ORDER — GUAIFENESIN 100 MG/5ML PO LIQD
200.00 | ORAL | Status: DC
Start: ? — End: 2019-03-09

## 2019-03-09 MED ORDER — DULOXETINE HCL 30 MG PO CPEP
60.00 | ORAL_CAPSULE | ORAL | Status: DC
Start: 2019-03-09 — End: 2019-03-09

## 2019-03-09 MED ORDER — POLYETHYLENE GLYCOL 3350 17 G PO PACK
17.00 | PACK | ORAL | Status: DC
Start: ? — End: 2019-03-09

## 2019-03-09 MED ORDER — HYDROXYZINE HCL 25 MG PO TABS
25.00 | ORAL_TABLET | ORAL | Status: DC
Start: ? — End: 2019-03-09

## 2019-03-09 MED ORDER — LEVOTHYROXINE SODIUM 100 MCG PO TABS
100.00 | ORAL_TABLET | ORAL | Status: DC
Start: 2019-03-09 — End: 2019-03-09

## 2019-03-09 MED ORDER — VITAMIN D (ERGOCALCIFEROL) 1.25 MG (50000 UNIT) PO CAPS
50000.00 | ORAL_CAPSULE | ORAL | Status: DC
Start: ? — End: 2019-03-09

## 2019-03-09 MED ORDER — HYDRALAZINE HCL 20 MG/ML IJ SOLN
5.00 | INTRAMUSCULAR | Status: DC
Start: ? — End: 2019-03-09

## 2019-03-09 MED ORDER — DARUN-COBIC-EMTRICIT-TENOFAF 800-150-200-10 MG PO TABS
1.00 | ORAL_TABLET | ORAL | Status: DC
Start: 2019-03-09 — End: 2019-03-09

## 2019-03-09 MED ORDER — SODIUM CHLORIDE 0.9 % IV SOLN
10.00 | INTRAVENOUS | Status: DC
Start: ? — End: 2019-03-09

## 2019-03-10 ENCOUNTER — Other Ambulatory Visit: Payer: Self-pay

## 2019-03-10 DIAGNOSIS — B182 Chronic viral hepatitis C: Secondary | ICD-10-CM

## 2019-03-10 DIAGNOSIS — Z21 Asymptomatic human immunodeficiency virus [HIV] infection status: Secondary | ICD-10-CM

## 2019-03-10 MED ORDER — GENERIC EXTERNAL MEDICATION
Status: DC
Start: ? — End: 2019-03-10

## 2019-03-10 MED ORDER — VITAMIN D (ERGOCALCIFEROL) 1.25 MG (50000 UNIT) PO CAPS
50000.00 | ORAL_CAPSULE | ORAL | Status: DC
Start: ? — End: 2019-03-10

## 2019-03-11 ENCOUNTER — Encounter: Payer: Self-pay | Admitting: Family Medicine

## 2019-03-11 ENCOUNTER — Ambulatory Visit (INDEPENDENT_AMBULATORY_CARE_PROVIDER_SITE_OTHER): Payer: Self-pay | Admitting: Family Medicine

## 2019-03-11 VITALS — BP 109/68 | HR 84 | Temp 98.0°F | Resp 14 | Ht 69.0 in | Wt 224.0 lb

## 2019-03-11 DIAGNOSIS — B348 Other viral infections of unspecified site: Secondary | ICD-10-CM

## 2019-03-11 LAB — T-HELPER CELL (CD4) - (RCID CLINIC ONLY)
CD4 % Helper T Cell: 43 % (ref 33–65)
CD4 T Cell Abs: 1465 /uL (ref 400–1790)

## 2019-03-11 MED ORDER — GUAIFENESIN ER 600 MG PO TB12: 600.0000 mg | ORAL_TABLET | Freq: Two times a day (BID) | ORAL | 0 refills | Status: AC

## 2019-03-11 NOTE — Progress Notes (Signed)
Patient [facility]   Progress Note: General Provider: Lanae Boast, FNP  SUBJECTIVE:   Cheryl Thompson is a 53 y.o. female who  has a past medical history of Acute alcoholic hepatitis, Depression, Diverticulitis (y-1), Diverticulosis (y-1), Hepatitis C, HIV (human immunodeficiency virus infection) ([place]), Hypertension, Seizures ([place]), Stroke ([place]), and Thyroid disease.. Patient presents today for Hospitalization Follow-up (URI ), Cough, and Fatigue Patient hospitalized from 8/21-8/24 due to rhinovirus infection. Elevation of CK and hypokalemia. Levels resolved prior to discharge. Negative for COVID. She states that she is doing better, but is still having thick mucus and cough. She states that she has taken coricidan HBP with mild relief.    Review of Systems  Constitutional: Negative.   HENT: Negative.   Eyes: Negative.   Respiratory: Positive for cough and sputum production. Negative for hemoptysis, shortness of breath and wheezing.   Cardiovascular: Negative.   Gastrointestinal: Negative.   Genitourinary: Negative.   Musculoskeletal: Negative.   Skin: Negative.   Neurological: Negative.   Psychiatric/Behavioral: Negative.      OBJECTIVE: BP 109/68 (BP Location: Right Arm, Patient Position: Sitting, Cuff Size: Normal)   Pulse 84   Temp 98 F (36.7 C) (Oral)   Resp 14   Ht 5\' 9"  (1.753 m)   Wt 224 lb (101.6 kg)   SpO2 100%   BMI 33.08 kg/m   Wt Readings from Last 3 Encounters:  03/11/19 224 lb (101.6 kg)  02/17/19 230 lb (104.3 kg)  01/21/19 231 lb (104.8 kg)     Physical Exam Vitals signs and nursing note reviewed.  Constitutional:      General: She is not in acute distress.    Appearance: She is well-developed.  HENT:     Head: Normocephalic and atraumatic.  Eyes:     Conjunctiva/sclera: Conjunctivae normal.     Pupils: Pupils are equal, round, and reactive to light.  Neck:     Musculoskeletal: Normal range of  motion.  Cardiovascular:     Rate and Rhythm: Normal rate and regular rhythm.     Heart sounds: Normal heart sounds.  Pulmonary:     Effort: Pulmonary effort is normal. No respiratory distress.     Breath sounds: Normal breath sounds.  Abdominal:     General: Bowel sounds are normal. There is no distension.     Palpations: Abdomen is soft.  Musculoskeletal: Normal range of motion.  Skin:    General: Skin is warm and dry.  Neurological:     Mental Status: She is alert and oriented to person, place, and time.  Psychiatric:        Mood and Affect: Mood normal.        Behavior: Behavior normal.        Thought Content: Thought content normal.        Judgment: Judgment normal.     ASSESSMENT/PLAN:   1. Rhinovirus infection - guaiFENesin (MUCINEX) 600 MG 12 hr tablet; Take 1 tablet (600 mg total) by mouth 2 (two) times daily for 7 days.  Dispense: 14 tablet; Refill: 0    Return if symptoms worsen or fail to improve.    The patient was given clear instructions to go to ER or return to medical center if symptoms do not improve, worsen or new problems develop. The patient verbalized understanding and agreed with plan of care.   [name], FNP-BC Patient [place],  [place] [phone]

## 2019-03-11 NOTE — Patient Instructions (Signed)

## 2019-03-13 ENCOUNTER — Telehealth: Payer: Self-pay

## 2019-03-13 LAB — HIV-1 RNA QUANT-NO REFLEX-BLD
HIV 1 RNA Quant: <20 copies/mL
HIV-1 RNA Quant, Log: <1.3 Log copies/mL

## 2019-03-13 NOTE — Telephone Encounter (Signed)
Called and spoke with patient, advised that she should go to ER for evaluation of falls. Thanks!

## 2019-03-24 ENCOUNTER — Other Ambulatory Visit: Payer: Self-pay

## 2019-03-24 ENCOUNTER — Encounter: Payer: Self-pay | Admitting: Internal Medicine

## 2019-03-24 DIAGNOSIS — Z21 Asymptomatic human immunodeficiency virus [HIV] infection status: Secondary | ICD-10-CM

## 2019-03-24 DIAGNOSIS — B182 Chronic viral hepatitis C: Secondary | ICD-10-CM

## 2019-03-25 ENCOUNTER — Encounter (HOSPITAL_COMMUNITY): Payer: Self-pay

## 2019-03-25 ENCOUNTER — Encounter (HOSPITAL_COMMUNITY): Payer: Self-pay | Admitting: *Deleted

## 2019-03-29 LAB — HCV RNA, QUANT REAL-TIME PCR W/REFLEX
HCV RNA, PCR, QN (Log): <1.18 LogIU/mL
HCV RNA, PCR, QN: <15 IU/mL

## 2019-03-30 ENCOUNTER — Ambulatory Visit: Payer: Self-pay | Admitting: Internal Medicine

## 2019-03-30 ENCOUNTER — Ambulatory Visit (INDEPENDENT_AMBULATORY_CARE_PROVIDER_SITE_OTHER): Payer: Self-pay | Admitting: Internal Medicine

## 2019-03-30 ENCOUNTER — Other Ambulatory Visit: Payer: Self-pay

## 2019-03-30 ENCOUNTER — Encounter: Payer: Self-pay | Admitting: Internal Medicine

## 2019-03-30 VITALS — BP 142/85 | HR 86 | Temp 98.2°F

## 2019-03-30 DIAGNOSIS — B182 Chronic viral hepatitis C: Secondary | ICD-10-CM

## 2019-03-30 DIAGNOSIS — R7401 Elevation of levels of liver transaminase levels: Secondary | ICD-10-CM

## 2019-03-30 DIAGNOSIS — F17200 Nicotine dependence, unspecified, uncomplicated: Secondary | ICD-10-CM

## 2019-03-30 DIAGNOSIS — G629 Polyneuropathy, unspecified: Secondary | ICD-10-CM

## 2019-03-30 DIAGNOSIS — Z21 Asymptomatic human immunodeficiency virus [HIV] infection status: Secondary | ICD-10-CM

## 2019-03-30 DIAGNOSIS — Z23 Encounter for immunization: Secondary | ICD-10-CM

## 2019-03-30 DIAGNOSIS — K7460 Unspecified cirrhosis of liver: Secondary | ICD-10-CM

## 2019-03-30 DIAGNOSIS — R740 Nonspecific elevation of levels of transaminase and lactic acid dehydrogenase [LDH]: Secondary | ICD-10-CM

## 2019-03-30 DIAGNOSIS — Z113 Encounter for screening for infections with a predominantly sexual mode of transmission: Secondary | ICD-10-CM

## 2019-03-30 NOTE — Assessment & Plan Note (Signed)
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

## 2019-03-30 NOTE — Assessment & Plan Note (Signed)
Due for [facility] screening and will schedule

## 2019-03-30 NOTE — Progress Notes (Signed)
   Subjective:    Patient ID: Cheryl Thompson, female    DOB: 30-Jan-1966, 53 y.o.   MRN: [id number]  HPI Here for follow up of chronic hepatitis C and HIV Completed 12 weeks of Epclusa over 4 months ago and follow up HCV undetectable.  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 Continues on Symtuza for HIV and no missed doses. CD4 of 1465 and viral load <20.  No complaints today.     Review of Systems  Constitutional: Negative for fatigue.  Gastrointestinal: Negative for diarrhea.  Skin: Negative for rash.       Objective:   Physical Exam Constitutional:      Appearance: Normal appearance.  Eyes:     General: No scleral icterus. Cardiovascular:     Rate and Rhythm: Normal rate and regular rhythm.     Heart sounds: No murmur.  Pulmonary:     Effort: Pulmonary effort is normal. No respiratory distress.     Breath sounds: Normal breath sounds.  Neurological:     General: No focal deficit present.     Mental Status: She is alert.  Psychiatric:        Mood and Affect: Mood normal.   SH: + tobacco        Assessment & Plan:

## 2019-03-30 NOTE — Assessment & Plan Note (Signed)
Doing well, no issues.  rtc 6 months.  

## 2019-03-30 NOTE — Assessment & Plan Note (Signed)
Discussed cessation. Pre contemplative at this time.  

## 2019-03-30 NOTE — Assessment & Plan Note (Signed)
Continues to have the same issue and pain.  Followed by her PCP

## 2019-04-21 ENCOUNTER — Ambulatory Visit (HOSPITAL_BASED_OUTPATIENT_CLINIC_OR_DEPARTMENT_OTHER): Payer: Self-pay

## 2019-04-23 ENCOUNTER — Ambulatory Visit: Payer: Self-pay | Admitting: Family Medicine

## 2019-04-23 ENCOUNTER — Telehealth: Payer: Self-pay | Admitting: Family Medicine

## 2019-04-23 DIAGNOSIS — M2550 Pain in unspecified joint: Secondary | ICD-10-CM

## 2019-04-23 MED ORDER — IBUPROFEN 800 MG PO TABS: ORAL_TABLET | ORAL | 0 refills | Status: DC

## 2019-04-23 NOTE — Telephone Encounter (Signed)
Refill sent into pharmacy. Thanks!  

## 2019-04-27 ENCOUNTER — Other Ambulatory Visit: Payer: Self-pay

## 2019-04-27 ENCOUNTER — Ambulatory Visit (HOSPITAL_BASED_OUTPATIENT_CLINIC_OR_DEPARTMENT_OTHER)
Admission: RE | Admit: 2019-04-27 | Discharge: 2019-04-27 | Disposition: A | Discharge status: Home / Self Care | Payer: Self-pay | Source: Ambulatory Visit | Attending: Internal Medicine | Admitting: Internal Medicine

## 2019-04-27 DIAGNOSIS — K7460 Unspecified cirrhosis of liver: Secondary | ICD-10-CM | POA: Insufficient documentation

## 2019-04-29 ENCOUNTER — Telehealth: Payer: Self-pay

## 2019-04-29 NOTE — Telephone Encounter (Signed)
Ultrasound looks good, no concerns on it. thanks

## 2019-04-29 NOTE — Telephone Encounter (Signed)
Patient called office today for [place] results. Advised patient once MD reviews [place] our office will give her a call.Will forward message to Md. Leroy

## 2019-05-12 ENCOUNTER — Ambulatory Visit (INDEPENDENT_AMBULATORY_CARE_PROVIDER_SITE_OTHER): Payer: Self-pay | Admitting: Family Medicine

## 2019-05-12 ENCOUNTER — Other Ambulatory Visit: Payer: Self-pay

## 2019-05-12 ENCOUNTER — Encounter: Payer: Self-pay | Admitting: Family Medicine

## 2019-05-12 VITALS — BP 123/83 | HR 85 | Temp 98.6°F | Resp 16 | Ht 69.0 in | Wt 227.0 lb

## 2019-05-12 DIAGNOSIS — E039 Hypothyroidism, unspecified: Secondary | ICD-10-CM

## 2019-05-12 DIAGNOSIS — I10 Essential (primary) hypertension: Secondary | ICD-10-CM

## 2019-05-12 DIAGNOSIS — R7303 Prediabetes: Secondary | ICD-10-CM

## 2019-05-12 DIAGNOSIS — Z21 Asymptomatic human immunodeficiency virus [HIV] infection status: Secondary | ICD-10-CM

## 2019-05-12 DIAGNOSIS — F332 Major depressive disorder, recurrent severe without psychotic features: Secondary | ICD-10-CM

## 2019-05-12 DIAGNOSIS — M62838 Other muscle spasm: Secondary | ICD-10-CM

## 2019-05-12 DIAGNOSIS — G629 Polyneuropathy, unspecified: Secondary | ICD-10-CM

## 2019-05-12 DIAGNOSIS — F17200 Nicotine dependence, unspecified, uncomplicated: Secondary | ICD-10-CM

## 2019-05-12 LAB — POCT URINALYSIS DIP (CLINITEK)
Bilirubin, UA: NEGATIVE
Blood, UA: NEGATIVE
Glucose, UA: NEGATIVE mg/dL
Ketones, POC UA: NEGATIVE mg/dL
Leukocytes, UA: NEGATIVE
Nitrite, UA: NEGATIVE
POC PROTEIN,UA: NEGATIVE
Spec Grav, UA: 1.02 (ref 1.010–1.025)
Urobilinogen, UA: 0.2 E.U./dL
pH, UA: 5.5 (ref 5.0–8.0)

## 2019-05-12 LAB — POCT GLYCOSYLATED HEMOGLOBIN (HGB A1C): Hemoglobin A1C: 5.7 % — AB (ref 4.0–5.6)

## 2019-05-12 MED ORDER — PREGABALIN 50 MG PO CAPS: ORAL_CAPSULE | ORAL | 0 refills | Status: DC

## 2019-05-12 MED ORDER — CYCLOBENZAPRINE HCL 5 MG PO TABS: 5.0000 mg | ORAL_TABLET | Freq: Every day | ORAL | 0 refills | Status: DC
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
## 2019-05-12 NOTE — Patient Instructions (Signed)
Peripheral Neuropathy Peripheral neuropathy is a type of nerve damage. It affects nerves that carry signals between the spinal cord and the arms, legs, and the rest of the body (peripheral nerves). It does not affect nerves in the spinal cord or brain. In peripheral neuropathy, one nerve or a group of nerves may be damaged. Peripheral neuropathy is a broad category that includes many specific nerve disorders, like diabetic neuropathy, hereditary neuropathy, and carpal tunnel syndrome. What are the causes? This condition may be caused by:  Diabetes. This is the most common cause of peripheral neuropathy.  Nerve injury.  Pressure or stress on a nerve that lasts a long time.  Lack (deficiency) of B vitamins. This can result from alcoholism, poor diet, or a restricted diet.  Infections.  Autoimmune diseases, such as rheumatoid arthritis and systemic lupus erythematosus.  Nerve diseases that are passed from parent to child (inherited).  Some medicines, such as cancer medicines (chemotherapy).  Poisonous (toxic) substances, such as lead and mercury.  Too little blood flowing to the legs.  Kidney disease.  Thyroid disease. In some cases, the cause of this condition is not known. What are the signs or symptoms? Symptoms of this condition depend on which of your nerves is damaged. Common symptoms include:  Loss of feeling (numbness) in the feet, hands, or both.  Tingling in the feet, hands, or both.  Burning pain.  Very sensitive skin.  Weakness.  Not being able to move a part of the body (paralysis).  Muscle twitching.  Clumsiness or poor coordination.  Loss of balance.  Not being able to control your bladder.  Feeling dizzy.  Sexual problems. How is this diagnosed? Diagnosing and finding the cause of peripheral neuropathy can be difficult. Your health care provider will take your medical history and do a physical exam. A neurological exam will also be done. This  involves checking things that are affected by your brain, spinal cord, and nerves (nervous system). For example, your health care provider will check your reflexes, how you move, and what you can feel. You may have other tests, such as:  Blood tests.  Electromyogram (EMG) and nerve conduction tests. These tests check nerve function and how well the nerves are controlling the muscles.  Imaging tests, such as CT scans or MRI to rule out other causes of your symptoms.  Removing a small piece of nerve to be examined in a lab (nerve biopsy). This is rare.  Removing and examining a small amount of the fluid that surrounds the brain and spinal cord (lumbar puncture). This is rare. How is this treated? Treatment for this condition may involve:  Treating the underlying cause of the neuropathy, such as diabetes, kidney disease, or vitamin deficiencies.  Stopping medicines that can cause neuropathy, such as chemotherapy.  Medicine to relieve pain. Medicines may include: ? Prescription or over-the-counter pain medicine. ? Antiseizure medicine. ? Antidepressants. ? Pain-relieving patches that are applied to painful areas of skin.  Surgery to relieve pressure on a nerve or to destroy a nerve that is causing pain.  Physical therapy to help improve movement and balance.  Devices to help you move around (assistive devices). Follow these instructions at home: Medicines  Take over-the-counter and prescription medicines only as told by your health care provider. Do not take any other medicines without first asking your health care provider.  Do not drive or use heavy machinery while taking prescription pain medicine. Lifestyle   Do not use any products that contain nicotine   or tobacco, such as cigarettes and e-cigarettes. Smoking keeps blood from reaching damaged nerves. If you need help quitting, ask your health care provider.  Avoid or limit alcohol. Too much alcohol can cause a vitamin B  deficiency, and vitamin B is needed for healthy nerves.  Eat a healthy diet. This includes: ? Eating foods that are high in fiber, such as fresh fruits and vegetables, whole grains, and beans. ? Limiting foods that are high in fat and processed sugars, such as fried or sweet foods. General instructions   If you have diabetes, work closely with your health care provider to keep your blood sugar under control.  If you have numbness in your feet: ? Check every day for signs of injury or infection. Watch for redness, warmth, and swelling. ? Wear padded socks and comfortable shoes. These help protect your feet.  Develop a good support system. Living with peripheral neuropathy can be stressful. Consider talking with a mental health specialist or joining a support group.  Use assistive devices and attend physical therapy as told by your health care provider. This may include using a walker or a cane.  Keep all follow-up visits as told by your health care provider. This is important. Contact a health care provider if:  You have new signs or symptoms of peripheral neuropathy.  You are struggling emotionally from dealing with peripheral neuropathy.  Your pain is not well-controlled. Get help right away if:  You have an injury or infection that is not healing normally.  You develop new weakness in an arm or leg.  You fall frequently. Summary  Peripheral neuropathy is when the nerves in the arms, or legs are damaged, resulting in numbness, weakness, or pain.  There are many causes of peripheral neuropathy, including diabetes, pinched nerves, vitamin deficiencies, autoimmune disease, and hereditary conditions.  Diagnosing and finding the cause of peripheral neuropathy can be difficult. Your health care provider will take your medical history, do a physical exam, and do tests, including blood tests and nerve function tests.  Treatment involves treating the underlying cause of the  neuropathy and taking medicines to help control pain. Physical therapy and assistive devices may also help. This information is not intended to replace advice given to you by your health care provider. Make sure you discuss any questions you have with your health care provider. Document Released: 06/22/2002 Document Revised: 06/14/2017 Document Reviewed: 09/10/2016 Elsevier Patient Education  2020 [employer]. Neuropathic Pain Neuropathic pain is pain caused by damage to the nerves that are responsible for certain sensations in your body (sensory nerves). The pain can be caused by:  Damage to the sensory nerves that send signals to your spinal cord and brain (peripheral nervous system).  Damage to the sensory nerves in your brain or spinal cord (central nervous system). Neuropathic pain can make you more sensitive to pain. Even a minor sensation can feel very painful. This is usually a long-term condition that can be difficult to treat. The type of pain differs from person to person. It may:  Start suddenly (acute), or it may develop slowly and last for a long time (chronic).  Come and go as damaged nerves heal, or it may stay at the same level for years.  Cause emotional distress, loss of sleep, and a lower quality of life. What are the causes? The most common cause of this condition is diabetes. Many other diseases and conditions can also cause neuropathic pain. Causes of neuropathic pain can be classified as:  Toxic. This is caused by medicines and chemicals. The most common cause of toxic neuropathic pain is damage from cancer treatments (chemotherapy).  Metabolic. This can be caused by: ? Diabetes. This is the most common disease that damages the nerves. ? Lack of vitamin B from long-term alcohol abuse.  Traumatic. Any injury that cuts, crushes, or stretches a nerve can cause damage and pain. A common example is feeling pain after losing an arm or leg (phantom limb pain).   Compression-related. If a sensory nerve gets trapped or compressed for a long period of time, the blood supply to the nerve can be cut off.  Vascular. Many blood vessel diseases can cause neuropathic pain by decreasing blood supply and oxygen to nerves.  Autoimmune. This type of pain results from diseases in which the body's defense system (immune system) mistakenly attacks sensory nerves. Examples of autoimmune diseases that can cause neuropathic pain include lupus and multiple sclerosis.  Infectious. Many types of viral infections can damage sensory nerves and cause pain. Shingles infection is a common cause of this type of pain.  Inherited. Neuropathic pain can be a symptom of many diseases that are passed down through families (genetic). What increases the risk? You are more likely to develop this condition if:  You have diabetes.  You smoke.  You drink too much alcohol.  You are taking certain medicines, including medicines that kill cancer cells (chemotherapy) or that treat immune system disorders. What are the signs or symptoms? The main symptom is pain. Neuropathic pain is often described as:  Burning.  Shock-like.  Stinging.  Hot or cold.  Itching. How is this diagnosed? No single test can diagnose neuropathic pain. It is diagnosed based on:  Physical exam and your symptoms. Your health care provider will ask you about your pain. You may be asked to use a pain scale to describe how bad your pain is.  Tests. These may be done to see if you have a high sensitivity to pain and to help find the cause and location of any sensory nerve damage. They include: ? Nerve conduction studies to test how well nerve signals travel through your sensory nerves (electrodiagnostic testing). ? Stimulating your sensory nerves through electrodes on your skin and measuring the response in your spinal cord and brain (somatosensory evoked potential).  Imaging studies, such as: ? X-rays. ? CT  scan. ? MRI. How is this treated? Treatment for neuropathic pain may change over time. You may need to try different treatment options or a combination of treatments. Some options include:  Treating the underlying cause of the neuropathy, such as diabetes, kidney disease, or vitamin deficiencies.  Stopping medicines that can cause neuropathy, such as chemotherapy.  Medicine to relieve pain. Medicines may include: ? Prescription or over-the-counter pain medicine. ? Anti-seizure medicine. ? Antidepressant medicines. ? Pain-relieving patches that are applied to painful areas of skin. ? A medicine to numb the area (local anesthetic), which can be injected as a nerve block.  Transcutaneous nerve stimulation. This uses electrical currents to block painful nerve signals. The treatment is painless.  Alternative treatments, such as: ? Acupuncture. ? Meditation. ? Massage. ? Physical therapy. ? Pain management programs. ? Counseling. Follow these instructions at home: Medicines   Take over-the-counter and prescription medicines only as told by your health care provider.  Do not drive or use heavy machinery while taking prescription pain medicine.  If you are taking prescription pain medicine, take actions to prevent or treat constipation. Your health  care provider may recommend that you: ? Drink enough fluid to keep your urine pale yellow. ? Eat foods that are high in fiber, such as fresh fruits and vegetables, whole grains, and beans. ? Limit foods that are high in fat and processed sugars, such as fried or sweet foods. ? Take an over-the-counter or prescription medicine for constipation. Lifestyle   Have a good support system at home.  Consider joining a chronic pain support group.  Do not use any products that contain nicotine or tobacco, such as cigarettes and e-cigarettes. If you need help quitting, ask your health care provider.  Do not drink alcohol. General instructions   Learn as much as you can about your condition.  Work closely with all your health care providers to find the treatment plan that works best for you.  Ask your health care provider what activities are safe for you.  Keep all follow-up visits as told by your health care provider. This is important. Contact a health care provider if:  Your pain treatments are not working.  You are having side effects from your medicines.  You are struggling with tiredness (fatigue), mood changes, depression, or anxiety. Summary  Neuropathic pain is pain caused by damage to the nerves that are responsible for certain sensations in your body (sensory nerves).  Neuropathic pain may come and go as damaged nerves heal, or it may stay at the same level for years.  Neuropathic pain is usually a long-term condition that can be difficult to treat. Consider joining a chronic pain support group. This information is not intended to replace advice given to you by your health care provider. Make sure you discuss any questions you have with your health care provider. Document Released: 03/29/2004 Document Revised: 10/23/2018 Document Reviewed: 07/19/2017 Elsevier Patient Education  2020 [employer].

## 2019-05-13 LAB — COMPREHENSIVE METABOLIC PANEL
ALT: 22 IU/L (ref 0–32)
AST: 38 IU/L (ref 0–40)
Albumin/Globulin Ratio: 1.1 — ABNORMAL LOW (ref 1.2–2.2)
Albumin: 4.2 g/dL (ref 3.8–4.9)
Alkaline Phosphatase: 67 IU/L (ref 39–117)
BUN/Creatinine Ratio: 14 (ref 9–23)
BUN: 13 mg/dL (ref 6–24)
Bilirubin Total: 0.3 mg/dL (ref 0.0–1.2)
CO2: 25 mmol/L (ref 20–29)
Calcium: 9.6 mg/dL (ref 8.7–10.2)
Chloride: 101 mmol/L (ref 96–106)
Creatinine, Ser: 0.91 mg/dL (ref 0.57–1.00)
GFR calc Af Amer: 83 mL/min/{1.73_m2} (ref >59)
GFR calc non Af Amer: 72 mL/min/{1.73_m2} (ref >59)
Globulin, Total: 3.8 g/dL (ref 1.5–4.5)
Glucose: 88 mg/dL (ref 65–99)
Potassium: 3.9 mmol/L (ref 3.5–5.2)
Sodium: 138 mmol/L (ref 134–144)
Total Protein: 8 g/dL (ref 6.0–8.5)

## 2019-05-15 ENCOUNTER — Telehealth: Payer: Self-pay

## 2019-05-15 NOTE — Telephone Encounter (Signed)
Called and spoke with patient, advised that all labs are within normal range and to keep follow up appointments. Thanks!

## 2019-05-15 NOTE — Telephone Encounter (Signed)
-----   Message from [name],  sent at 05/14/2019  4:20 PM EDT ----- Regarding: lab results Please inform patient that labs are within a normal range. Please follow up with specialists and in office as scheduled.   [name]  APRN, MSN, FNP-C [facility] [address] [place], [place] [address] [phone]

## 2019-07-27 ENCOUNTER — Other Ambulatory Visit: Payer: Self-pay

## 2019-07-27 ENCOUNTER — Encounter: Payer: Self-pay | Admitting: Nurse Practitioner

## 2019-07-27 ENCOUNTER — Ambulatory Visit (INDEPENDENT_AMBULATORY_CARE_PROVIDER_SITE_OTHER): Payer: Self-pay | Admitting: Nurse Practitioner

## 2019-07-27 DIAGNOSIS — J069 Acute upper respiratory infection, unspecified: Secondary | ICD-10-CM

## 2019-07-27 MED ORDER — CEFDINIR 300 MG PO CAPS: 300.0000 mg | ORAL_CAPSULE | Freq: Two times a day (BID) | ORAL | 0 refills | Status: AC
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
## 2019-07-27 NOTE — Patient Instructions (Signed)
Otitis Media, Adult  Otitis media means that the middle ear is red and swollen (inflamed) and full of fluid. The condition usually goes away on its own. Follow these instructions at home:  Take over-the-counter and prescription medicines only as told by your doctor.  If you were prescribed an antibiotic medicine, take it as told by your doctor. Do not stop taking the antibiotic even if you start to feel better.  Keep all follow-up visits as told by your doctor. This is important. Contact a doctor if:  You have bleeding from your nose.  There is a lump on your neck.  You are not getting better in 5 days.  You feel worse instead of better. Get help right away if:  You have pain that is not helped with medicine.  You have swelling, redness, or pain around your ear.  You get a stiff neck.  You cannot move part of your face (paralyzed).  You notice that the bone behind your ear hurts when you touch it.  You get a very bad headache. Summary  Otitis media means that the middle ear is red, swollen, and full of fluid.  This condition usually goes away on its own. In some cases, treatment may be needed.  If you were prescribed an antibiotic medicine, take it as told by your doctor. This information is not intended to replace advice given to you by your health care provider. Make sure you discuss any questions you have with your health care provider. Document Revised: 06/14/2017 Document Reviewed: 07/23/2016 Elsevier Patient Education  2020 Elsevier Inc.  

## 2019-07-29 ENCOUNTER — Other Ambulatory Visit: Payer: Self-pay | Admitting: Nurse Practitioner

## 2019-07-29 ENCOUNTER — Telehealth: Payer: Self-pay | Admitting: Family Medicine

## 2019-07-29 MED ORDER — AZITHROMYCIN 250 MG PO TABS: ORAL_TABLET | ORAL | 0 refills | Status: DC

## 2019-07-29 NOTE — Telephone Encounter (Signed)
Crystal, Please advise.

## 2019-07-29 NOTE — Telephone Encounter (Signed)
No Pt stated she did not get it the pharmacy told her she cant take it

## 2019-07-29 NOTE — Telephone Encounter (Signed)
Called, no answer. No voicemail set up, was unable to leave a message.

## 2019-07-29 NOTE — Telephone Encounter (Signed)
No, not picked up from pharmacy due to an interaction according to patient.

## 2019-07-29 NOTE — Assessment & Plan Note (Signed)
Encourage patient to continue to use Coricidin HBP as directed wait-and-see antibiotic provided Follow-up as needed

## 2019-07-30 ENCOUNTER — Other Ambulatory Visit: Payer: Self-pay

## 2019-07-30 MED ORDER — AZITHROMYCIN 250 MG PO TABS: ORAL_TABLET | ORAL | 0 refills | Status: AC

## 2019-08-04 DIAGNOSIS — W19XXXA Unspecified fall, initial encounter: Secondary | ICD-10-CM | POA: Insufficient documentation

## 2019-08-04 DIAGNOSIS — S43401A Unspecified sprain of right shoulder joint, initial encounter: Secondary | ICD-10-CM | POA: Insufficient documentation

## 2019-08-06 ENCOUNTER — Encounter: Payer: Self-pay | Admitting: Family Medicine

## 2019-08-06 ENCOUNTER — Ambulatory Visit (INDEPENDENT_AMBULATORY_CARE_PROVIDER_SITE_OTHER): Payer: Self-pay | Admitting: Family Medicine

## 2019-08-06 ENCOUNTER — Other Ambulatory Visit: Payer: Self-pay

## 2019-08-06 DIAGNOSIS — S43303A Subluxation of unspecified parts of unspecified shoulder girdle, initial encounter: Secondary | ICD-10-CM | POA: Insufficient documentation

## 2019-08-06 DIAGNOSIS — S43301A Subluxation of unspecified parts of right shoulder girdle, initial encounter: Secondary | ICD-10-CM
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
## 2019-08-06 NOTE — Assessment & Plan Note (Signed)
Likely had a subluxation. Imaging has been normal. Unlikely for tear. Has been wearing sling since injury.  - continue sling -Counseled on home exercise therapy and supportive care. -Provided Duexis samples. -Could consider physical therapy or glenohumeral injection.

## 2019-08-06 NOTE — Progress Notes (Signed)
Medication Samples have been provided to the patient.  Drug name: Duexis       Strength: 800 mg/26.6mg         Qty: 2 Boxes  LOT: [id number]  Exp.Date: 06/2020  Dosing instructions: Take 1 tablet by mouth three (3) times a day.  The patient has been instructed regarding the correct time, dose, and frequency of taking this medication, including desired effects and most common side effects.   Sherrie George, MA 2:29 PM 08/06/2019

## 2019-08-06 NOTE — Patient Instructions (Signed)
Good to see you Please try the sling  Please try the range of motion movements  Please use ice and heat   Please send me a message in MyChart with any questions or updates.  Please see me back in 3-4 weeks.   --Dr. Raeford Razor

## 2019-08-11 ENCOUNTER — Ambulatory Visit: Payer: Self-pay | Admitting: Family Medicine

## 2019-08-17 ENCOUNTER — Ambulatory Visit: Payer: Self-pay | Admitting: Nurse Practitioner

## 2019-08-21 ENCOUNTER — Ambulatory Visit (INDEPENDENT_AMBULATORY_CARE_PROVIDER_SITE_OTHER): Payer: Self-pay | Admitting: Nurse Practitioner

## 2019-08-21 ENCOUNTER — Other Ambulatory Visit: Payer: Self-pay

## 2019-08-21 ENCOUNTER — Encounter: Payer: Self-pay | Admitting: Nurse Practitioner

## 2019-08-21 VITALS — BP 146/81 | HR 78 | Temp 98.4°F | Resp 16 | Ht 69.0 in | Wt 231.0 lb

## 2019-08-21 DIAGNOSIS — I10 Essential (primary) hypertension: Secondary | ICD-10-CM

## 2019-08-21 DIAGNOSIS — E559 Vitamin D deficiency, unspecified: Secondary | ICD-10-CM

## 2019-08-21 DIAGNOSIS — R8290 Unspecified abnormal findings in urine: Secondary | ICD-10-CM

## 2019-08-21 DIAGNOSIS — R7303 Prediabetes: Secondary | ICD-10-CM

## 2019-08-21 DIAGNOSIS — K7460 Unspecified cirrhosis of liver: Secondary | ICD-10-CM

## 2019-08-21 DIAGNOSIS — E781 Pure hyperglyceridemia: Secondary | ICD-10-CM

## 2019-08-21 DIAGNOSIS — G40909 Epilepsy, unspecified, not intractable, without status epilepticus: Secondary | ICD-10-CM

## 2019-08-21 DIAGNOSIS — F3163 Bipolar disorder, current episode mixed, severe, without psychotic features: Secondary | ICD-10-CM

## 2019-08-21 DIAGNOSIS — Z21 Asymptomatic human immunodeficiency virus [HIV] infection status: Secondary | ICD-10-CM

## 2019-08-21 DIAGNOSIS — E039 Hypothyroidism, unspecified: Secondary | ICD-10-CM

## 2019-08-21 LAB — POCT URINALYSIS DIPSTICK
Bilirubin, UA: NEGATIVE
Glucose, UA: NEGATIVE
Ketones, UA: NEGATIVE
Leukocytes, UA: NEGATIVE
Nitrite, UA: NEGATIVE
Protein, UA: NEGATIVE
Spec Grav, UA: 1.015 (ref 1.010–1.025)
Urobilinogen, UA: 0.2 E.U./dL
pH, UA: 5.5 (ref 5.0–8.0)

## 2019-08-21 LAB — POCT GLYCOSYLATED HEMOGLOBIN (HGB A1C): Hemoglobin A1C: 5.9 % — AB (ref 4.0–5.6)
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
## 2019-08-21 NOTE — Patient Instructions (Addendum)
Preventing Cerebrovascular Disease  Arteries are blood vessels that carry blood that contains oxygen from the heart to all parts of the body. Cerebrovascular disease affects arteries that supply the brain. Any condition that blocks or disrupts blood flow to the brain can cause cerebrovascular disease. Brain cells that lose blood supply start to die within minutes (stroke). Stroke is the main danger of cerebrovascular disease. Atherosclerosis and high blood pressure are common causes of cerebrovascular disease. Atherosclerosis is narrowing and hardening of an artery that results when fat, cholesterol, calcium, or other substances (plaque) build up inside an artery. Plaque reduces blood flow through the artery. High blood pressure increases the risk of bleeding inside the brain. Making diet and lifestyle changes to prevent atherosclerosis and high blood pressure lowers your risk of cerebrovascular disease. What nutrition changes can be made?  Eat more fruits, vegetables, and whole grains.  Reduce how much saturated fat you eat. To do this, eat less red meat and fewer full-fat dairy products.  Eat healthy proteins instead of red meat. Healthy proteins include: ? Fish. Eat fish that contains heart-healthy omega-3 fatty acids, twice a week. Examples include salmon, albacore tuna, mackerel, and herring. ? Chicken. ? Nuts. ? Low-fat or nonfat yogurt.  Avoid processed meats, like bacon and lunchmeat.  Avoid foods that contain: ? A lot of sugar, such as sweets and drinks with added sugar. ? A lot of salt (sodium). Avoid adding extra salt to your food, as told by your health care provider. ? Trans fats, such as margarine and baked goods. Trans fats may be listed as "partially hydrogenated oils" on food labels.  Check food labels to see how much sodium, sugar, and trans fats are in foods.  Use vegetable oils that contain low amounts of saturated fat, such as olive oil or canola oil. What lifestyle  changes can be made?  Drink alcohol in moderation. This means no more than 1 drink a day for nonpregnant women and 2 drinks a day for men. One drink equals 12 oz of beer, 5 oz of wine, or 1 oz of hard liquor.  If you are overweight, ask your health care provider to recommend a weight-loss plan for you. Losing 5-10 lb (2.2-4.5 kg) can reduce your risk of diabetes, atherosclerosis, and high blood pressure.  Exercise for 30?60 minutes on most days, or as much as told by your health care provider. ? Do moderate-intensity exercise, such as brisk walking, bicycling, and water aerobics. Ask your health care provider which activities are safe for you.   Do not use any products that contain nicotine or tobacco, such as cigarettes and e-cigarettes. If you need help quitting, ask your health care provider. Why are these changes important? Making these changes lowers your risk of many diseases that can cause cerebrovascular disease and stroke. Stroke is a leading cause of death and disability. Making these changes also improves your overall health and quality of life. What can I do to lower my risk? The following factors make you more likely to develop cerebrovascular disease:  Being overweight.  Smoking.  Being physically inactive.  Eating a high-fat diet.  Having certain health conditions, such as: ? Diabetes. ? High blood pressure. ? Heart disease. ? Atherosclerosis. ? High cholesterol. ? Sickle cell disease.  Talk with your health care provider about your risk for cerebrovascular disease. Work with your health care provider to control diseases that you have that may contribute to cerebrovascular disease. Your health care provider may prescribe medicines to  help prevent major causes of cerebrovascular disease. Where to find more information Learn more about preventing cerebrovascular disease from:  [place], Lung, and [place]:  MoAnalyst.de  Centers for Disease Control and Prevention: http://www.curry-wood.biz/ Summary  Cerebrovascular disease can lead to a stroke.  Atherosclerosis and high blood pressure are major causes of cerebrovascular disease.  Making diet and lifestyle changes can reduce your risk of cerebrovascular disease.  Work with your health care provider to get your risk factors under control to reduce your risk of cerebrovascular disease. This information is not intended to replace advice given to you by your health care provider. Make sure you discuss any questions you have with your health care provider. Document Revised: 06/14/2017 Document Reviewed: 07/17/2015 Elsevier Patient Education  2020 [employer].  Steps to Quit Smoking Smoking tobacco is the leading cause of preventable death. It can affect almost every organ in the body. Smoking puts you and people around you at risk for many serious, long-lasting (chronic) diseases. Quitting smoking can be hard, but it is one of the best things that you can do for your health. It is never too late to quit. How do I get ready to quit? When you decide to quit smoking, make a plan to help you succeed. Before you quit: Pick a date to quit. Set a date within the next 2 weeks to give you time to prepare. Write down the reasons why you are quitting. Keep this list in places where you will see it often. Tell your family, friends, and co-workers that you are quitting. Their support is important. Talk with your doctor about the choices that may help you quit. Find out if your health insurance will pay for these treatments. Know the people, places, things, and activities that make you want to smoke (triggers). Avoid them. What first steps can I take to quit smoking? Throw away all cigarettes at home, at work, and in your car. Throw away the things that you use when you smoke, such as ashtrays and lighters. Clean your  car. Make sure to empty the ashtray. Clean your home, including curtains and carpets. What can I do to help me quit smoking? Talk with your doctor about taking medicines and seeing a counselor at the same time. You are more likely to succeed when you do both. If you are pregnant or breastfeeding, talk with your doctor about counseling or other ways to quit smoking. Do not take medicine to help you quit smoking unless your doctor tells you to do so. To quit smoking: Quit right away Quit smoking totally, instead of slowly cutting back on how much you smoke over a period of time. Go to counseling. You are more likely to quit if you go to counseling sessions regularly. Take medicine You may take medicines to help you quit. Some medicines need a prescription, and some you can buy over-the-counter. Some medicines may contain a drug called nicotine to replace the nicotine in cigarettes. Medicines may: Help you to stop having the desire to smoke (cravings). Help to stop the problems that come when you stop smoking (withdrawal symptoms). Your doctor may ask you to use: Nicotine patches, gum, or lozenges. Nicotine inhalers or sprays. Non-nicotine medicine that is taken by mouth. Find resources Find resources and other ways to help you quit smoking and remain smoke-free after you quit. These resources are most helpful when you use them often. They include: Online chats with a Social worker. Phone quitlines. Printed Furniture conservator/restorer. Support groups or  group counseling. Text messaging programs. Mobile phone apps. Use apps on your mobile phone or tablet that can help you stick to your quit plan. There are many free apps for mobile phones and tablets as well as websites. Examples include Quit Guide from the [employer] and smokefree.gov  What things can I do to make it easier to quit?  Talk to your family and friends. Ask them to support and encourage you. Call a phone quitline (1-800-QUIT-NOW), reach out to  support groups, or work with a Social worker. Ask people who smoke to not smoke around you. Avoid places that make you want to smoke, such as: Bars. Parties. Smoke-break areas at work. Spend time with people who do not smoke. Lower the stress in your life. Stress can make you want to smoke. Try these things to help your stress: Getting regular exercise. Doing deep-breathing exercises. Doing yoga. Meditating. Doing a body scan. To do this, close your eyes, focus on one area of your body at a time from head to toe. Notice which parts of your body are tense. Try to relax the muscles in those areas. How will I feel when I quit smoking? Day 1 to 3 weeks Within the first 24 hours, you may start to have some problems that come from quitting tobacco. These problems are very bad 2-3 days after you quit, but they do not often last for more than 2-3 weeks. You may get these symptoms: Mood swings. Feeling restless, nervous, angry, or annoyed. Trouble concentrating. Dizziness. Strong desire for high-sugar foods and nicotine. Weight gain. Trouble pooping (constipation). Feeling like you may vomit (nausea). Coughing or a sore throat. Changes in how the medicines that you take for other issues work in your body. Depression. Trouble sleeping (insomnia). Week 3 and afterward After the first 2-3 weeks of quitting, you may start to notice more positive results, such as: Better sense of smell and taste. Less coughing and sore throat. Slower heart rate. Lower blood pressure. Clearer skin. Better breathing. Fewer sick days. Quitting smoking can be hard. Do not give up if you fail the first time. Some people need to try a few times before they succeed. Do your best to stick to your quit plan, and talk with your doctor if you have any questions or concerns. Summary Smoking tobacco is the leading cause of preventable death. Quitting smoking can be hard, but it is one of the best things that you can do for  your health. When you decide to quit smoking, make a plan to help you succeed. Quit smoking right away, not slowly over a period of time. When you start quitting, seek help from your doctor, family, or friends. This information is not intended to replace advice given to you by your health care provider. Make sure you discuss any questions you have with your health care provider. Document Revised: 03/27/2019 Document Reviewed: 09/20/2018 Elsevier Patient Education  [place].

## 2019-08-23 ENCOUNTER — Other Ambulatory Visit: Payer: Self-pay | Admitting: Nurse Practitioner

## 2019-08-23 ENCOUNTER — Encounter: Payer: Self-pay | Admitting: Nurse Practitioner

## 2019-08-23 DIAGNOSIS — E559 Vitamin D deficiency, unspecified: Secondary | ICD-10-CM | POA: Insufficient documentation

## 2019-08-23 LAB — URINE CULTURE

## 2019-08-23 LAB — COMPREHENSIVE METABOLIC PANEL
ALT: 17 IU/L (ref 0–32)
AST: 32 IU/L (ref 0–40)
Albumin/Globulin Ratio: 1.1 — ABNORMAL LOW (ref 1.2–2.2)
Albumin: 4.2 g/dL (ref 3.8–4.9)
Alkaline Phosphatase: 79 IU/L (ref 39–117)
BUN/Creatinine Ratio: 17 (ref 9–23)
BUN: 16 mg/dL (ref 6–24)
Bilirubin Total: <0.2 mg/dL (ref 0.0–1.2)
CO2: 21 mmol/L (ref 20–29)
Calcium: 9.7 mg/dL (ref 8.7–10.2)
Chloride: 100 mmol/L (ref 96–106)
Creatinine, Ser: 0.92 mg/dL (ref 0.57–1.00)
GFR calc Af Amer: 82 mL/min/{1.73_m2} (ref >59)
GFR calc non Af Amer: 71 mL/min/{1.73_m2} (ref >59)
Globulin, Total: 3.9 g/dL (ref 1.5–4.5)
Glucose: 75 mg/dL (ref 65–99)
Potassium: 3.8 mmol/L (ref 3.5–5.2)
Sodium: 138 mmol/L (ref 134–144)
Total Protein: 8.1 g/dL (ref 6.0–8.5)

## 2019-08-23 LAB — SPECIMEN STATUS REPORT

## 2019-08-23 MED ORDER — ERGOCALCIFEROL 1.25 MG (50000 UT) PO CAPS: 50000.0000 [IU] | ORAL_CAPSULE | ORAL | 5 refills | Status: DC

## 2019-08-27 ENCOUNTER — Encounter: Payer: Self-pay | Admitting: Nurse Practitioner

## 2019-08-27 DIAGNOSIS — E785 Hyperlipidemia, unspecified: Secondary | ICD-10-CM | POA: Insufficient documentation

## 2019-08-27 LAB — LIPID PANEL
Chol/HDL Ratio: 8.7 ratio — ABNORMAL HIGH (ref 0.0–4.4)
Cholesterol, Total: 225 mg/dL — ABNORMAL HIGH (ref 100–199)
HDL: 26 mg/dL — ABNORMAL LOW (ref >39)
LDL Chol Calc (NIH): 116 mg/dL — ABNORMAL HIGH (ref 0–99)
Triglycerides: 472 mg/dL — ABNORMAL HIGH (ref 0–149)
VLDL Cholesterol Cal: 83 mg/dL — ABNORMAL HIGH (ref 5–40)

## 2019-08-27 LAB — TSH: TSH: 4.05 u[IU]/mL (ref 0.450–4.500)

## 2019-08-27 LAB — VITAMIN D 25 HYDROXY (VIT D DEFICIENCY, FRACTURES): Vit D, 25-Hydroxy: 14.3 ng/mL — ABNORMAL LOW (ref 30.0–100.0)

## 2019-08-28 ENCOUNTER — Other Ambulatory Visit: Payer: Self-pay | Admitting: Nurse Practitioner

## 2019-08-28 ENCOUNTER — Telehealth: Payer: Self-pay | Admitting: Family Medicine

## 2019-08-28 DIAGNOSIS — E785 Hyperlipidemia, unspecified: Secondary | ICD-10-CM

## 2019-08-28 DIAGNOSIS — E781 Pure hyperglyceridemia: Secondary | ICD-10-CM

## 2019-08-28 MED ORDER — ROSUVASTATIN CALCIUM 10 MG PO TABS: 10.0000 mg | ORAL_TABLET | Freq: Every day | ORAL | 3 refills | Status: DC

## 2019-08-28 NOTE — Telephone Encounter (Signed)
Pt called to consent to a cholesterol medicine (crestore)  in response to a my chart message sent by the doctor

## 2019-08-28 NOTE — Telephone Encounter (Signed)
Can you please put in order for Crestor for pt, she has agreed to start this. Thanks!

## 2019-08-30 ENCOUNTER — Other Ambulatory Visit: Payer: Self-pay | Admitting: Family Medicine

## 2019-08-30 DIAGNOSIS — G40909 Epilepsy, unspecified, not intractable, without status epilepticus: Secondary | ICD-10-CM

## 2019-08-30 DIAGNOSIS — I10 Essential (primary) hypertension: Secondary | ICD-10-CM

## 2019-09-03 ENCOUNTER — Ambulatory Visit: Payer: Self-pay | Admitting: Family Medicine

## 2019-09-07 ENCOUNTER — Ambulatory Visit: Payer: Self-pay | Admitting: Family Medicine

## 2019-09-08 ENCOUNTER — Ambulatory Visit: Payer: Self-pay | Admitting: Family Medicine

## 2019-09-08 ENCOUNTER — Other Ambulatory Visit: Payer: Self-pay
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
## 2019-09-15 ENCOUNTER — Telehealth: Payer: Self-pay | Admitting: Family Medicine

## 2019-09-15 NOTE — Telephone Encounter (Signed)
Called, no answer. Voicemail was not set up, unable to leave a message.

## 2019-09-15 NOTE — Telephone Encounter (Signed)
Patient returned call. She is saying when she takes the crestor that it makes her dizzy. She says she was taking it at 3 in the afternoon but changed to right before bed due to dizziness and now she still wakes up dizzy. She is asking if this can be changed or stopped. Please advise.

## 2019-09-15 NOTE — Telephone Encounter (Signed)
Pt had negative reaction to Crestor. Please call pt back.

## 2019-09-16 ENCOUNTER — Telehealth: Payer: Self-pay | Admitting: Family Medicine

## 2019-09-16 NOTE — Telephone Encounter (Signed)
Pt returning call from nurse. Please call pt back.

## 2019-09-16 NOTE — Telephone Encounter (Signed)
Sure it can be stopped. Would she like to try another medication Simvastatin or Pravastatin?

## 2019-09-16 NOTE — Telephone Encounter (Signed)
Called, no answer. No voicemail.  

## 2019-09-16 NOTE — Telephone Encounter (Signed)
Called and spoke with patient. Advised that she can stop Crestor. She is not sure if she wants to try simvastatin but she says she will think about it and call us back. She does not want pravastatin, she has tried that before. Thanks!

## 2019-09-16 NOTE — Telephone Encounter (Signed)
See previous message

## 2019-09-24 NOTE — Addendum Note (Signed)
Addended by: Dolan Amen D on: 09/24/2019 10:26 AM   Modules accepted: Orders

## 2019-09-28 ENCOUNTER — Other Ambulatory Visit: Payer: Self-pay

## 2019-09-28 ENCOUNTER — Telehealth: Payer: Self-pay | Admitting: *Deleted

## 2019-09-28 DIAGNOSIS — Z21 Asymptomatic human immunodeficiency virus [HIV] infection status: Secondary | ICD-10-CM

## 2019-09-28 DIAGNOSIS — Z113 Encounter for screening for infections with a predominantly sexual mode of transmission: Secondary | ICD-10-CM

## 2019-09-28 NOTE — Telephone Encounter (Signed)
Patient here for labs, wanted to discuss new abdominal pain (right lower quadrant) and bright red blood per rectum with a nurse.  She states she called her primary care doctor this weekend who advised her to go to the emergency room for evaluation.  She was worried about going to the emergency room over the weekend and waited at home to see if it would get better.  Since her symptoms have not resolved, but actually feel worse, she will follow her primary care doctor's advice and go to the emergency room for assessment. Landis Gandy, RN

## 2019-09-29 LAB — T-HELPER CELL (CD4) - (RCID CLINIC ONLY)
CD4 % Helper T Cell: 44 % (ref 33–65)
CD4 T Cell Abs: 1205 /uL (ref 400–1790)

## 2019-09-30 LAB — CBC WITH DIFFERENTIAL/PLATELET
Absolute Monocytes: 581 cells/uL (ref 200–950)
Basophils Absolute: 42 cells/uL (ref 0–200)
Basophils Relative: 0.5 %
Eosinophils Absolute: 291 cells/uL (ref 15–500)
Eosinophils Relative: 3.5 %
HCT: 32.5 % — ABNORMAL LOW (ref 35.0–45.0)
Hemoglobin: 10.9 g/dL — ABNORMAL LOW (ref 11.7–15.5)
Lymphs Abs: 2897 cells/uL (ref 850–3900)
MCH: 30.2 pg (ref 27.0–33.0)
MCHC: 33.5 g/dL (ref 32.0–36.0)
MCV: 90 fL (ref 80.0–100.0)
MPV: 13.4 fL — ABNORMAL HIGH (ref 7.5–12.5)
Monocytes Relative: 7 %
Neutro Abs: 4490 cells/uL (ref 1500–7800)
Neutrophils Relative %: 54.1 %
Platelets: 122 10*3/uL — ABNORMAL LOW (ref 140–400)
RBC: 3.61 10*6/uL — ABNORMAL LOW (ref 3.80–5.10)
RDW: 13.2 % (ref 11.0–15.0)
Total Lymphocyte: 34.9 %
WBC: 8.3 10*3/uL (ref 3.8–10.8)

## 2019-09-30 LAB — COMPLETE METABOLIC PANEL WITH GFR
AG Ratio: 1.1 (calc) (ref 1.0–2.5)
ALT: 17 U/L (ref 6–29)
AST: 29 U/L (ref 10–35)
Albumin: 4 g/dL (ref 3.6–5.1)
Alkaline phosphatase (APISO): 61 U/L (ref 37–153)
BUN: 13 mg/dL (ref 7–25)
CO2: 31 mmol/L (ref 20–32)
Calcium: 9.5 mg/dL (ref 8.6–10.4)
Chloride: 105 mmol/L (ref 98–110)
Creat: 0.84 mg/dL (ref 0.50–1.05)
GFR, Est African American: 91 mL/min/{1.73_m2} (ref >=60)
GFR, Est Non African American: 79 mL/min/{1.73_m2} (ref >=60)
Globulin: 3.7 g/dL (calc) (ref 1.9–3.7)
Glucose, Bld: 89 mg/dL (ref 65–99)
Potassium: 3.4 mmol/L — ABNORMAL LOW (ref 3.5–5.3)
Sodium: 140 mmol/L (ref 135–146)
Total Bilirubin: 0.3 mg/dL (ref 0.2–1.2)
Total Protein: 7.7 g/dL (ref 6.1–8.1)

## 2019-09-30 LAB — HIV-1 RNA QUANT-NO REFLEX-BLD
HIV 1 RNA Quant: 26 copies/mL — ABNORMAL HIGH
HIV-1 RNA Quant, Log: 1.41 Log copies/mL — ABNORMAL HIGH

## 2019-09-30 LAB — RPR: RPR Ser Ql: NONREACTIVE

## 2019-10-14 ENCOUNTER — Encounter: Payer: Self-pay | Admitting: Internal Medicine

## 2019-10-19 ENCOUNTER — Encounter: Payer: Self-pay | Admitting: Internal Medicine

## 2019-10-19 ENCOUNTER — Ambulatory Visit: Payer: Self-pay

## 2019-10-19 ENCOUNTER — Ambulatory Visit (INDEPENDENT_AMBULATORY_CARE_PROVIDER_SITE_OTHER): Payer: Self-pay | Admitting: Internal Medicine

## 2019-10-19 ENCOUNTER — Other Ambulatory Visit: Payer: Self-pay

## 2019-10-19 VITALS — BP 152/104 | HR 82 | Temp 97.9°F | Ht 69.0 in | Wt 226.0 lb

## 2019-10-19 DIAGNOSIS — Z21 Asymptomatic human immunodeficiency virus [HIV] infection status: Secondary | ICD-10-CM

## 2019-10-19 DIAGNOSIS — Z113 Encounter for screening for infections with a predominantly sexual mode of transmission: Secondary | ICD-10-CM

## 2019-10-19 DIAGNOSIS — K7460 Unspecified cirrhosis of liver: Secondary | ICD-10-CM

## 2019-10-19 DIAGNOSIS — K922 Gastrointestinal hemorrhage, unspecified: Secondary | ICD-10-CM

## 2019-10-19 NOTE — Assessment & Plan Note (Signed)
Screened negative 

## 2019-10-19 NOTE — Assessment & Plan Note (Signed)
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  She continues off of alcohol and hepatitis C has been treated.   Will continue with Octa screening.

## 2019-10-19 NOTE — Assessment & Plan Note (Signed)
She is doing well, no changes and rtc in 6 months.

## 2019-10-19 NOTE — Assessment & Plan Note (Signed)
Unknown cause at this time.  Possibility of upper GI with her smoking history or cirrhosis with varices.  Undergoing evaluation at [facility] in [place].

## 2019-10-19 NOTE — Progress Notes (Signed)
   Subjective:    Patient ID: Cheryl Thompson, female    DOB: 01-Dec-1965, 54 y.o.   MRN: [id number]  HPI Here for follow up of HIV She also has had ongoing abdominal pain and noted some rectal bleeding recently.  Is being followed by [facility] and is scheduled for an upper and lower endoscopy.  HAd a recent CT scan and the report suggests early cirrhosis.  Has had some abdominal swelling (? Ascites).   No missed doses otehrwise and CD4 1205 and viral load 26.     Review of Systems  Constitutional: Negative for fatigue.  Gastrointestinal: Negative for diarrhea.  Skin: Negative for rash.       Objective:   Physical Exam Constitutional:      Appearance: Normal appearance.  Eyes:     General: No scleral icterus. Cardiovascular:     Rate and Rhythm: Normal rate and regular rhythm.     Heart sounds: No murmur.  Pulmonary:     Effort: Pulmonary effort is normal. No respiratory distress.     Breath sounds: Normal breath sounds.  Neurological:     General: No focal deficit present.     Mental Status: She is alert.  Psychiatric:        Mood and Affect: Mood normal.   SH: remains drug and alcohol free        Assessment & Plan:

## 2019-11-03 ENCOUNTER — Encounter: Payer: Self-pay | Admitting: Family Medicine

## 2019-11-03 ENCOUNTER — Other Ambulatory Visit: Payer: Self-pay

## 2019-11-03 ENCOUNTER — Telehealth: Payer: Self-pay | Admitting: Gastroenterology

## 2019-11-03 ENCOUNTER — Ambulatory Visit (INDEPENDENT_AMBULATORY_CARE_PROVIDER_SITE_OTHER): Payer: Self-pay | Admitting: Family Medicine

## 2019-11-03 VITALS — BP 142/89 | HR 81 | Temp 98.8°F | Resp 16 | Ht 69.0 in | Wt 231.0 lb

## 2019-11-03 DIAGNOSIS — R140 Abdominal distension (gaseous): Secondary | ICD-10-CM

## 2019-11-03 DIAGNOSIS — R6881 Early satiety: Secondary | ICD-10-CM

## 2019-11-03 DIAGNOSIS — M2550 Pain in unspecified joint: Secondary | ICD-10-CM

## 2019-11-03 DIAGNOSIS — G629 Polyneuropathy, unspecified: Secondary | ICD-10-CM

## 2019-11-03 DIAGNOSIS — R5383 Other fatigue: Secondary | ICD-10-CM

## 2019-11-03 DIAGNOSIS — Z9289 Personal history of other medical treatment: Secondary | ICD-10-CM

## 2019-11-03 DIAGNOSIS — R7303 Prediabetes: Secondary | ICD-10-CM

## 2019-11-03 DIAGNOSIS — R1084 Generalized abdominal pain: Secondary | ICD-10-CM

## 2019-11-03 DIAGNOSIS — E039 Hypothyroidism, unspecified: Secondary | ICD-10-CM

## 2019-11-03 LAB — POCT URINALYSIS DIPSTICK
Bilirubin, UA: NEGATIVE
Blood, UA: NEGATIVE
Glucose, UA: NEGATIVE
Ketones, UA: NEGATIVE
Nitrite, UA: NEGATIVE
Protein, UA: NEGATIVE
Spec Grav, UA: <=1.005 — AB (ref 1.010–1.025)
Urobilinogen, UA: 0.2 E.U./dL
pH, UA: 5.5 (ref 5.0–8.0)

## 2019-11-03 LAB — POCT GLYCOSYLATED HEMOGLOBIN (HGB A1C): Hemoglobin A1C: 5.8 % — AB (ref 4.0–5.6)

## 2019-11-03 NOTE — Patient Instructions (Signed)
You and I have discussed the fact that you may have polypharmacy you are on a good amount of medications at this time. Your A1c is 5.8.  We will discontinue Metformin at this time. Advised to follow-up with carbohydrate modified diet to control prediabetes.  Will reevaluate in 3 months to ensure that A1c is not increasing.  Again following a consistent diet is clinically very important.  For rectal bleeding, I have sent a referral to low-power gastroenterology.  It is important that you follow-up given your long medical history strong family history for GI cancer  Also, we have sent a new referral to neurology.  They will contact you to schedule your first available appointment.   Follow-up with infectious disease as scheduled.   Prediabetes Eating Plan Prediabetes is a condition that causes blood sugar (glucose) levels to be higher than normal. This increases the risk for developing diabetes. In order to prevent diabetes from developing, your health care provider may recommend a diet and other lifestyle changes to help you:  Control your blood glucose levels.  Improve your cholesterol levels.  Manage your blood pressure. Your health care provider may recommend working with a diet and nutrition specialist (dietitian) to make a meal plan that is best for you. What are tips for following this plan? Lifestyle  Set weight loss goals with the help of your health care team. It is recommended that most people with prediabetes lose 7% of their current body weight.  Exercise for at least 30 minutes at least 5 days a week.  Attend a support group or seek ongoing support from a mental health counselor.  Take over-the-counter and prescription medicines only as told by your health care provider. Reading food labels  Read food labels to check the amount of fat, salt (sodium), and sugar in prepackaged foods. Avoid foods that have: ? Saturated fats. ? Trans fats. ? Added sugars.  Avoid foods  that have more than 300 milligrams (mg) of sodium per serving. Limit your daily sodium intake to less than 2,300 mg each day. Shopping  Avoid buying pre-made and processed foods. Cooking  Cook with olive oil. Do not use butter, lard, or ghee.  Bake, broil, grill, or boil foods. Avoid frying. Meal planning   Work with your dietitian to develop an eating plan that is right for you. This may include: ? Tracking how many calories you take in. Use a food diary, notebook, or mobile application to track what you eat at each meal. ? Using the glycemic index (GI) to plan your meals. The index tells you how quickly a food will raise your blood glucose. Choose low-GI foods. These foods take a longer time to raise blood glucose.  Consider following a Mediterranean diet. This diet includes: ? Several servings each day of fresh fruits and vegetables. ? Eating fish at least twice a week. ? Several servings each day of whole grains, beans, nuts, and seeds. ? Using olive oil instead of other fats. ? Moderate alcohol consumption. ? Eating small amounts of red meat and whole-fat dairy.  If you have high blood pressure, you may need to limit your sodium intake or follow a diet such as the DASH eating plan. DASH is an eating plan that aims to lower high blood pressure. What foods are recommended? The items listed below may not be a complete list. Talk with your dietitian about what dietary choices are best for you. Grains Whole grains, such as whole-wheat or whole-grain breads, crackers, cereals, and  pasta. Unsweetened oatmeal. Bulgur. Barley. Quinoa. Brown rice. Corn or whole-wheat flour tortillas or taco shells. Vegetables Lettuce. Spinach. Peas. Beets. Cauliflower. Cabbage. Broccoli. Carrots. Tomatoes. Squash. Eggplant. Herbs. Peppers. Onions. Cucumbers. Brussels sprouts. Fruits Berries. Bananas. Apples. Oranges. Grapes. Papaya. Mango. Pomegranate. Kiwi. Grapefruit. Cherries. Meats and other protein  foods Seafood. Poultry without skin. Lean cuts of pork and beef. Tofu. Eggs. Nuts. Beans. Dairy Low-fat or fat-free dairy products, such as yogurt, cottage cheese, and cheese. Beverages Water. Tea. Coffee. Sugar-free or diet soda. Seltzer water. Lowfat or no-fat milk. Milk alternatives, such as soy or almond milk. Fats and oils Olive oil. Canola oil. Sunflower oil. Grapeseed oil. Avocado. Walnuts. Sweets and desserts Sugar-free or low-fat pudding. Sugar-free or low-fat ice cream and other frozen treats. Seasoning and other foods Herbs. Sodium-free spices. Mustard. Relish. Low-fat, low-sugar ketchup. Low-fat, low-sugar barbecue sauce. Low-fat or fat-free mayonnaise. What foods are not recommended? The items listed below may not be a complete list. Talk with your dietitian about what dietary choices are best for you. Grains Refined white flour and flour products, such as bread, pasta, snack foods, and cereals. Vegetables Canned vegetables. Frozen vegetables with butter or cream sauce. Fruits Fruits canned with syrup. Meats and other protein foods Fatty cuts of meat. Poultry with skin. Breaded or fried meat. Processed meats. Dairy Full-fat yogurt, cheese, or milk. Beverages Sweetened drinks, such as sweet iced tea and soda. Fats and oils Butter. Lard. Ghee. Sweets and desserts Baked goods, such as cake, cupcakes, pastries, cookies, and cheesecake. Seasoning and other foods Spice mixes with added salt. Ketchup. Barbecue sauce. Mayonnaise. Summary  To prevent diabetes from developing, you may need to make diet and other lifestyle changes to help control blood sugar, improve cholesterol levels, and manage your blood pressure.  Set weight loss goals with the help of your health care team. It is recommended that most people with prediabetes lose 7 percent of their current body weight.  Consider following a Mediterranean diet that includes plenty of fresh fruits and vegetables, whole  grains, beans, nuts, seeds, fish, lean meat, low-fat dairy, and healthy oils. This information is not intended to replace advice given to you by your health care provider. Make sure you discuss any questions you have with your health care provider. Document Revised: 10/24/2018 Document Reviewed: 09/05/2016 Elsevier Patient Education  2020 [employer].

## 2019-11-03 NOTE — Telephone Encounter (Signed)
DOD 11/03/19  Dr. Havery Moros, this pt was referred by Cammie Sickle, FNP to be evaluated for abd pain, bloating and early satiety.  Pt had a colonoscopy and OV at [facility] (available in [facility]) on 08/11/18 and 09/29/19, respectively.  Pt requested a GI MD in [place].  Please review records and advise whether you will accept this patient.
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
## 2019-11-03 NOTE — Telephone Encounter (Signed)
I will be happy to see her in the office for a visit. Thanks

## 2019-11-04 ENCOUNTER — Encounter: Payer: Self-pay | Admitting: Neurology

## 2019-11-04 ENCOUNTER — Other Ambulatory Visit: Payer: Self-pay | Admitting: Family Medicine

## 2019-11-04 LAB — THYROID PANEL WITH TSH
Free Thyroxine Index: 1.3 (ref 1.2–4.9)
T3 Uptake Ratio: 21 % — ABNORMAL LOW (ref 24–39)
T4, Total: 6.4 ug/dL (ref 4.5–12.0)
TSH: 8.83 u[IU]/mL — ABNORMAL HIGH (ref 0.450–4.500)

## 2019-11-04 LAB — CMP AND LIVER
ALT: 19 IU/L (ref 0–32)
AST: 32 IU/L (ref 0–40)
Albumin: 4.4 g/dL (ref 3.8–4.9)
Alkaline Phosphatase: 78 IU/L (ref 39–117)
BUN: 9 mg/dL (ref 6–24)
Bilirubin Total: 0.2 mg/dL (ref 0.0–1.2)
Bilirubin, Direct: 0.08 mg/dL (ref 0.00–0.40)
CO2: 23 mmol/L (ref 20–29)
Calcium: 9.7 mg/dL (ref 8.7–10.2)
Chloride: 99 mmol/L (ref 96–106)
Creatinine, Ser: 0.85 mg/dL (ref 0.57–1.00)
GFR calc Af Amer: 90 mL/min/{1.73_m2} (ref >59)
GFR calc non Af Amer: 78 mL/min/{1.73_m2} (ref >59)
Glucose: 71 mg/dL (ref 65–99)
Potassium: 3.9 mmol/L (ref 3.5–5.2)
Sodium: 137 mmol/L (ref 134–144)
Total Protein: 8.2 g/dL (ref 6.0–8.5)

## 2019-11-04 LAB — CBC WITH DIFFERENTIAL/PLATELET
Basophils Absolute: 0 10*3/uL (ref 0.0–0.2)
Basos: 0 %
EOS (ABSOLUTE): 0.2 10*3/uL (ref 0.0–0.4)
Eos: 3 %
Hematocrit: 35 % (ref 34.0–46.6)
Hemoglobin: 11.7 g/dL (ref 11.1–15.9)
Immature Grans (Abs): 0 10*3/uL (ref 0.0–0.1)
Immature Granulocytes: 0 %
Lymphocytes Absolute: 3.7 10*3/uL — ABNORMAL HIGH (ref 0.7–3.1)
Lymphs: 42 %
MCH: 30 pg (ref 26.6–33.0)
MCHC: 33.4 g/dL (ref 31.5–35.7)
MCV: 90 fL (ref 79–97)
Monocytes Absolute: 0.7 10*3/uL (ref 0.1–0.9)
Monocytes: 8 %
Neutrophils Absolute: 4.1 10*3/uL (ref 1.4–7.0)
Neutrophils: 47 %
Platelets: 132 10*3/uL — ABNORMAL LOW (ref 150–450)
RBC: 3.9 x10E6/uL (ref 3.77–5.28)
RDW: 13.7 % (ref 11.7–15.4)
WBC: 8.7 10*3/uL (ref 3.4–10.8)

## 2019-11-04 LAB — ARTHRITIS PANEL
Anti Nuclear Antibody (ANA): POSITIVE — AB
Rheumatoid fact SerPl-aCnc: <10 IU/mL (ref 0.0–13.9)
Sed Rate: 48 mm/hr — ABNORMAL HIGH (ref 0–40)
Uric Acid: 5.4 mg/dL (ref 3.0–7.2)

## 2019-11-05 ENCOUNTER — Encounter: Payer: Self-pay | Admitting: Gastroenterology

## 2019-11-05 LAB — URINE CULTURE

## 2019-11-06 ENCOUNTER — Encounter: Payer: Self-pay | Admitting: Internal Medicine

## 2019-11-20 ENCOUNTER — Telehealth: Payer: Self-pay | Admitting: Family Medicine

## 2019-11-20 ENCOUNTER — Other Ambulatory Visit: Payer: Self-pay

## 2019-11-20 DIAGNOSIS — M2550 Pain in unspecified joint: Secondary | ICD-10-CM

## 2019-11-20 DIAGNOSIS — M62838 Other muscle spasm: Secondary | ICD-10-CM

## 2019-11-20 MED ORDER — IBUPROFEN 800 MG PO TABS: ORAL_TABLET | ORAL | 0 refills | Status: DC

## 2019-11-20 MED ORDER — LEVOTHYROXINE SODIUM 175 MCG PO TABS: 175.0000 ug | ORAL_TABLET | Freq: Every day | ORAL | 1 refills | Status: DC

## 2019-11-20 MED ORDER — CYCLOBENZAPRINE HCL 5 MG PO TABS: 5.0000 mg | ORAL_TABLET | Freq: Every day | ORAL | 0 refills | Status: DC

## 2019-11-20 NOTE — Telephone Encounter (Signed)
tars

## 2019-11-20 NOTE — Telephone Encounter (Signed)
Fyi , Pt just wanted [place] to know that she woke up this morning with pain in her legs , some numbness that lasted for a couple of hour that has gotten better,she said that she has an appt on this coming  Tuesday and wanted to discuss at her visit wanted this note in her chart.

## 2019-11-24 ENCOUNTER — Encounter: Payer: Self-pay | Admitting: Family Medicine

## 2019-11-24 ENCOUNTER — Ambulatory Visit (INDEPENDENT_AMBULATORY_CARE_PROVIDER_SITE_OTHER): Payer: Self-pay | Admitting: Family Medicine

## 2019-11-24 ENCOUNTER — Other Ambulatory Visit: Payer: Self-pay

## 2019-11-24 VITALS — BP 140/88 | HR 73 | Ht 69.0 in | Wt 230.4 lb

## 2019-11-24 DIAGNOSIS — R768 Other specified abnormal immunological findings in serum: Secondary | ICD-10-CM

## 2019-11-24 DIAGNOSIS — G629 Polyneuropathy, unspecified: Secondary | ICD-10-CM

## 2019-11-24 DIAGNOSIS — G8929 Other chronic pain: Secondary | ICD-10-CM

## 2019-11-24 DIAGNOSIS — I10 Essential (primary) hypertension: Secondary | ICD-10-CM

## 2019-11-24 LAB — POCT URINALYSIS DIPSTICK
Bilirubin, UA: NEGATIVE
Blood, UA: NEGATIVE
Glucose, UA: NEGATIVE
Ketones, UA: NEGATIVE
Leukocytes, UA: NEGATIVE
Nitrite, UA: NEGATIVE
Protein, UA: NEGATIVE
Spec Grav, UA: 1.025 (ref 1.010–1.025)
Urobilinogen, UA: 0.2 E.U./dL
pH, UA: 7 (ref 5.0–8.0)
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
## 2019-11-24 NOTE — Patient Instructions (Signed)
Ms. Filho, thanks for coming in today.  You have lost a total of 5 pounds since your last visit.  We will obtain an autoimmune panel, I will follow-up with you by phone with your laboratory results.  If there are any abnormalities or outliers, I will send a referral to rheumatology for further work-up and evaluation.  If there are any questions or concerns or any new problems develop, please feel free to contact our office.  Again, thank you for allowing [place] to be part of your care.   [name]  APRN, MSN, FNP-C [facility] [address] [place], [place] [address] [phone]

## 2019-11-27 ENCOUNTER — Other Ambulatory Visit: Payer: Self-pay

## 2019-11-27 DIAGNOSIS — Z1231 Encounter for screening mammogram for malignant neoplasm of breast: Secondary | ICD-10-CM

## 2019-11-28 LAB — ANTI-CCP AB, IGG + IGA (RDL): Anti-CCP Ab, IgG + IgA (RDL): <20 Units (ref <20)

## 2019-11-28 LAB — LUPUS ANTICOAGULANT PANEL
Dilute Viper Venom Time: 39.1 s (ref 0.0–47.0)
PTT Lupus Anticoagulant: 36.9 s (ref 0.0–51.9)

## 2019-11-30 ENCOUNTER — Other Ambulatory Visit: Payer: Self-pay | Admitting: Obstetrics and Gynecology

## 2019-11-30 DIAGNOSIS — Z1231 Encounter for screening mammogram for malignant neoplasm of breast: Secondary | ICD-10-CM

## 2019-12-09 ENCOUNTER — Ambulatory Visit (INDEPENDENT_AMBULATORY_CARE_PROVIDER_SITE_OTHER): Payer: Self-pay | Admitting: Gastroenterology

## 2019-12-09 ENCOUNTER — Encounter: Payer: Self-pay | Admitting: Gastroenterology

## 2019-12-09 VITALS — BP 130/84 | HR 80 | Ht 66.75 in | Wt 226.1 lb

## 2019-12-09 DIAGNOSIS — K625 Hemorrhage of anus and rectum: Secondary | ICD-10-CM

## 2019-12-09 DIAGNOSIS — Z8619 Personal history of other infectious and parasitic diseases: Secondary | ICD-10-CM

## 2019-12-09 DIAGNOSIS — R140 Abdominal distension (gaseous): Secondary | ICD-10-CM

## 2019-12-09 DIAGNOSIS — R1312 Dysphagia, oropharyngeal phase: Secondary | ICD-10-CM

## 2019-12-09 DIAGNOSIS — R109 Unspecified abdominal pain: Secondary | ICD-10-CM

## 2019-12-09 MED ORDER — HYOSCYAMINE SULFATE 0.125 MG SL SUBL: 0.1250 mg | SUBLINGUAL_TABLET | SUBLINGUAL | 1 refills | Status: DC | PRN

## 2019-12-09 NOTE — Patient Instructions (Addendum)
If you are age 55 or older, your body mass index should be between 23-30. Your Body mass index is 35.68 kg/m. If this is out of the aforementioned range listed, please consider follow up with your Primary Care Provider.  If you are age 62 or younger, your body mass index should be between 19-25. Your Body mass index is 35.68 kg/m. If this is out of the aformentioned range listed, please consider follow up with your Primary Care Provider.   You have been scheduled for an abdominal ultrasound at [facility] (1st floor of hospital) on Friday, 12-18-19 at 9:00am. Please arrive 15 minutes prior to your appointment for registration. Make certain not to have anything to eat or drink 6 hours prior to your appointment. Should you need to reschedule your appointment, please contact radiology at [phone]. This test typically takes about 30 minutes to perform.  _________________________________________________________________  Dennis Bast will be contacted tomorrow to be scheduled for a modified barium swallow.   Your appointment will be on ___________________  at ________________. Please arrive 15 minutes prior to your test for registration. You will go to [facility] (1st Floor) for your appointment. Should you need to cancel or reschedule your appointment, please contact [phone] [place]) or [phone] [place]). _____________________________________________________________________ A Modified Barium Swallow Study, or MBS, is a special x-ray that is taken to check swallowing skills. It is carried out by a Stage manager and a Psychologist, clinical (SLP). During this test, yourmouth, throat, and esophagus, a muscular tube which connects your mouth to your stomach, is checked. The test will help you, your doctor, and the SLP plan what types of foods and liquids are easier for you to swallow. The SLP will also identify positions and ways to help you swallow more easily and  safely. What will happen during an MBS? You will be taken to an x-ray room and seated comfortably. You will be asked to swallow small amounts of food and liquid mixed with barium. Barium is a liquid or paste that allows images of your mouth, throat and esophagus to be seen on x-ray. The x-ray captures moving images of the food you are swallowing as it travels from your mouth through your throat and into your esophagus. This test helps identify whether food or liquid is entering your lungs (aspiration). The test also shows which part of your mouth or throat lacks strength or coordination to move the food or liquid in the right direction. This test typically takes 30 minutes to 1 hour to complete. ____________________________________________________________________   We are giving you a Low FOD-MAP diet to follow.  Discontinue Bentyl.   We have sent the following medications to your pharmacy for you to pick up at your convenience: Levsin: 0.125 sublingual: dissolve I tablet under the tongue every 4 hours as needed  You will be due for a recall colonoscopy in 07-2021. We will send you a reminder in the mail when it gets closer to that time.  Thank you for entrusting me with your care and for choosing [facility], [address] [place]

## 2019-12-09 NOTE — Progress Notes (Signed)
HPI :  54 year old female with a history of HIV, hepatitis C with possible cirrhosis, history of alcohol use, referred by Cammie Sickle FNP for rectal bleeding, bloating, cirrhosis, dysphagia.  Patient was previously seen by [place] PA-C at [facility] but she wanted another opinion.  She has multiple issues she wanted to discuss today. She had rectal bleeding over the past few months.  She states has been having bright red blood in the stools and on the toilet paper.  This was happening with each bowel movement for several weeks however it stopped about 1 to 2 weeks ago and has not noticed it since.  She states she was having normal bowel movements at the time.  No anal pain with this.  She states her stools were more soft denies straining.  She was given a trial of some MiraLAX for possible constipation previously.  She was scheduled for a flex sig but never followed through with getting it done.  She has anywhere from 2-3 bowel mmovements per day.  She does have some lower abdominal discomfort and bloating when she has a bowel movement, and then the symptoms are relieved with a bowel movement.  She has abdominal bloating with gas and distention that bothers her periodically.  Having a bowel movement will make her feel better reliably.  She states she does not sure what happened but the bleeding completely stopped a few weeks ago.  She had a CBC done on April 20 when the symptoms were bothering her and her hemoglobin was normal at 11.7 with an MCV of 90.  Platelet count 132.  She has had some benefit with taking Bentyl for her abdominal cramps, however it makes her a bit sedated.  She had a CT scan of her abdomen pelvis in August of last year which showed diverticulosis and a ventral hernia.  She states she has been having some difficulty swallowing.  She states she feels " strangled" when she ingests anything ranging from solids, to liquids, to air.  This is been going on for  the past 2 months.  Sometimes she can have coughing when this occurs.  She denies any difficulty swallowing 1 to get past her throat.  She feels a swelling in her oropharynx.  No reflux symptoms that are bothering her.  She otherwise carries a history of HIV history of hepatitis C.  Previously treated with Epclusa to eradicate hep C.  She had an ultrasound with elastography in 2019 showing F3-F4 fibrosis.  She had an ultrasound this past October which was normal.  She does have some mild thrombocytopenia.  She states she was told she has cirrhosis.  LFTs normal.  Mother has history of cirrhosis and HCC.  Brother had colon cancer in his 25s and her brother son had colon cancer at age 81.  [place] 04/2019 normal, elastography in 2019 showed F3-F4 fibrosis  CT abdomen 02/2019 - diverticulosis, ventral hernia  Colonoscopy Jan 2020 - pandiverticulosis, hyperplastic polyps, 47mm ascending TVA - repeat colonoscopy in 3 years recommended  EGD 2014 - normal   Past Medical History:  Diagnosis Date  . Acute alcoholic hepatitis   . Alcoholism ([place])   . Anxiety   . Arthritis   . Colon polyps   . Depression   . Diabetes ([place])   . Diverticulitis y-1  . Diverticulosis y-1  . Hepatitis C   . HIV (human immunodeficiency virus infection) ([place])   . HLD (hyperlipidemia)   . Hypertension   .  Hypothyroidism   . Pneumonia   . Seizures ([place])   . Stroke [facility])      Past Surgical History:  Procedure Laterality Date  . ABDOMINAL HYSTERECTOMY    . CHOLECYSTECTOMY    . dental    . 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 TOTAL ABDOMINAL HYSTERECTOMY W/ BILATERAL SALPINGOOPHORECTOMY  2006   Family History  Problem Relation Age of Onset  . Drug abuse Mother   . Diabetes Sister   . Diabetes Maternal Aunt   . Hypertension Maternal Aunt   . Hyperlipidemia Maternal Aunt   . Stroke Maternal Grandmother   . Hypertension Maternal Grandmother   . Diabetes Maternal Grandmother    . Stroke Maternal Grandfather   . Alcohol abuse Maternal Grandfather    Social History   Tobacco Use  . Smoking status: Light Tobacco Smoker    Packs/day: 0.25    Types: Cigarettes  . Smokeless tobacco: Never Used  Substance Use Topics  . Alcohol use: No    Alcohol/week: 0.0 standard drinks    Comment: no alchohol since 2016  . Drug use: No    Frequency: 7.0 times per week    Types: Marijuana, Cocaine, Methamphetamines    Comment: chronic// clean since 10/2014   Current Outpatient Medications  Medication Sig Dispense Refill  . albuterol (PROVENTIL) (2.5 MG/3ML) 0.083% nebulizer solution Take 3 mLs (2.5 mg total) by nebulization every 6 (six) hours as needed for wheezing or shortness of breath. 150 mL 1  . albuterol (VENTOLIN HFA) 108 (90 Base) MCG/ACT inhaler Inhale 2 puffs into the lungs every 6 (six) hours as needed for wheezing or shortness of breath. 18 g 3  . clotrimazole-betamethasone (LOTRISONE) cream Apply 1 application topically 2 (two) times daily. 30 g 0  . cyclobenzaprine (FLEXERIL) 5 MG tablet Take 1 tablet (5 mg total) by mouth at bedtime. 30 tablet 0  . DULoxetine (CYMBALTA) 60 MG capsule TAKE 1 CAPSULE(60 MG) BY MOUTH DAILY 30 capsule 3  . ergocalciferol (VITAMIN D2) 1.25 MG (50000 UT) capsule Take 1 capsule (50,000 Units total) by mouth once a week. 12 capsule 5  . hydrochlorothiazide (HYDRODIURIL) 25 MG tablet TAKE 1 TABLET(25 MG) BY MOUTH DAILY FOR HIGH BLOOD PRESSURE 30 tablet 5  . hydrOXYzine (ATARAX/VISTARIL) 25 MG tablet Take 1 tablet (25 mg total) by mouth 3 (three) times daily as needed. 30 tablet 0  . ibuprofen (ADVIL) 800 MG tablet TAKE 1 TABLET(800 MG) BY MOUTH EVERY 8 HOURS AS NEEDED. 30 tablet 0  . levETIRAcetam (KEPPRA) 500 MG tablet TAKE 1 TABLET(500 MG) BY MOUTH TWICE DAILY 180 tablet 2  . levocetirizine (XYZAL) 5 MG tablet Take 1 tablet (5 mg total) by mouth every evening. 30 tablet 2  . levothyroxine (SYNTHROID) 175 MCG tablet Take 1 tablet (175 mcg  total) by mouth daily before breakfast. 90 tablet 1  . nicotine (NICODERM CQ - DOSED IN MG/24 HOURS) 21 mg/24hr patch Place 1 patch (21 mg total) onto the skin daily. (Patient taking differently: Place 21 mg onto the skin as needed. ) 28 patch 0  . Omega-3 Fatty Acids (FISH OIL) 1000 MG CAPS Take 1 capsule by mouth 2 (two) times daily.    . pregabalin (LYRICA) 50 MG capsule Take 50 mg in am and 50 mg at bedtime 60 capsule 0  . SYMTUZA 800-150-200-10 MG TABS TAKE 1 TABLET BY MOUTH DAILY WITH BREAKFAST 30 tablet 5  . triamcinolone ointment (  KENALOG) 0.5 % Apply 1 application topically 2 (two) times daily. 80 g 0   No current facility-administered medications for this visit.   Allergies  Allergen Reactions  . Penicillins Anaphylaxis  . Naproxen Hives, Itching and Rash    Orange tablet=itching  . Doxycycline Hives    blisters  . Morphine And Related     Recovering narcotic user-prefers no narcs  . Statins Nausea And Vomiting  . Biktarvy [Bictegravir-Emtricitab-Tenofov] Rash     Review of Systems: All systems reviewed and negative except where noted in HPI.   Lab Results  Component Value Date   WBC 8.7 11/03/2019   HGB 11.7 11/03/2019   HCT 35.0 11/03/2019   MCV 90 11/03/2019   PLT 132 (L) 11/03/2019    Lab Results  Component Value Date   CREATININE 0.85 11/03/2019   BUN 9 11/03/2019   NA 137 11/03/2019   K 3.9 11/03/2019   CL 99 11/03/2019   CO2 23 11/03/2019    Lab Results  Component Value Date   ALT 19 11/03/2019   AST 32 11/03/2019   ALKPHOS 78 11/03/2019   BILITOT 0.2 11/03/2019     Physical Exam: Ht 5' 6.75" (1.695 m) Comment: height measured without shoes  Wt 226 lb 2 oz (102.6 kg)   BMI 35.68 kg/m  Constitutional: Pleasant,well-developed, female in no acute distress. HEENT: Normocephalic and atraumatic. Conjunctivae are normal. No scleral icterus. Neck supple.  Cardiovascular: Normal rate, regular rhythm.  Pulmonary/chest: Effort normal and breath  sounds normal. No wheezing, rales or rhonchi. Abdominal: Soft, nondistended, nontender.. There are no masses palpable. No hepatomegaly. DRE / Anoscopy - CMA Tia Alert as standby - no fissure. No mass lesions. Internal hemorrhoids noted on anoscopy in all positions, small Extremities: no edema Lymphadenopathy: No cervical adenopathy noted. Neurological: Alert and oriented to person place and time. Skin: Skin is warm and dry. No rashes noted. Psychiatric: Normal mood and affect. Behavior is normal.   ASSESSMENT AND PLAN: 54 year old female here for new patient assessment of the following:  Bloating / abdominal cramps - is quite possible she has underlying irritable bowel syndrome/functional bowel disorder.  We discussed what this is.  She has had colonoscopy, CT scan, normal labs in recent past which is all reassuring.  She states Bentyl definitely helps her pain although it been making her a bit sedated.  We will try switching to a lower dose Levsin to see if that will help her at all.  Regarding her bloating and gas recommend she try a low FODMAP diet to see if this makes her feel any better as well.  We will see how she does on this regimen over the next few weeks.  If no improvement she should call me back to discuss other options  Rectal bleeding -persistent symptoms for several weeks with a normal hemoglobin.  Colonoscopy up-to-date with reported good prep.  Anoscopy shows internal hemorrhoids today which is the most likely cause versus slow stuttering diverticular bleed that would be unusual to be ongoing for so long.  Her symptoms have resolved few weeks ago would continue to monitor, if she has recurrent bleeding would try some topical Anusol or hydrocortisone.  She is due for a colonoscopy in 2023 for history of polyps, if she has recurrent persistent bleeding in the interim we may consider a flex sig.  She agreed  History of hep C - some mild thrombocytopenia with F3 F4 fibrosis on  elastography pretherapy.  I offered her surveillance ultrasound  to ensure no evidence of cirrhosis.  Her LFTs are normal.  If she has cirrhosis that is compensated at this time, though we discussed risk for decompensation moving forward.  She has a strong family history of cirrhosis and [place] and wants to proceed with ultrasound  Oropharyngeal dysphagia - her symptoms sound very much oropharyngeal to me.  Remote EGD normal.  We will start with modified barium swallow and see what it shows.  May consider regular barium swallow versus repeat EGD with empiric dilation pending her course.  History of colon polyps - strong family history of colon cancer as well, due for surveillance colonoscopy January 2023  [place] Cellar, MD [facility]  CC: [name], FNP

## 2019-12-10 ENCOUNTER — Telehealth: Payer: Self-pay | Admitting: Emergency Medicine

## 2019-12-10 ENCOUNTER — Inpatient Hospital Stay: Admission: RE | Admit: 2019-12-10 | Payer: Self-pay | Source: Ambulatory Visit

## 2019-12-10 DIAGNOSIS — M5442 Lumbago with sciatica, left side: Secondary | ICD-10-CM

## 2019-12-10 DIAGNOSIS — M5441 Lumbago with sciatica, right side: Secondary | ICD-10-CM

## 2019-12-10 NOTE — Progress Notes (Signed)
Based on what you shared with me, I feel your condition warrants further evaluation and I recommend that you be seen for a face to face office visit.  Your medical history makes many seemingly benign conditions, high risk and you would be better served where a provider can see you physically.   NOTE: If you entered your credit card information for this eVisit, you will not be charged. You may see a "hold" on your card for the $35 but that hold will drop off and you will not have a charge processed.   If you are having a true medical emergency please call 911.      For an urgent face to face visit, [facility] has five urgent care centers for your convenience:      NEW:  [facility] [place] [facility] [place] Get Driving Directions [id number] [address] [place], [place] [address] . 10 am - 6pm Monday - Friday    [place] [facility]) Get Driving Directions [id number] [address] [place], [place] [address] . 10 am to 8 pm Monday-Friday . 12 pm to 8 pm [facility] Care at [facility] Get Driving Directions [id number] [place], [place], [place] [address] . 8 am to 8 pm Monday-Friday . 9 am to 6 pm Saturday . 11 am to 6 pm Sunday     [facility] at [facility] Get Driving Directions  [id number] [address], [place] [address] . 8 am to 8 pm Monday-Friday . 8 am to 4 pm [facility]gent Care at [place] Get Driving Directions [id number] [place], [place] [address] . 12 pm to 6 pm Monday-Friday      Your e-visit answers were reviewed by a board certified advanced clinical practitioner to complete your personal care plan.  Thank you for using e-Visits.   Greater than 5 but less than 10 minutes spent researching, coordinating, and implementing care for this patient today

## 2019-12-11 ENCOUNTER — Telehealth: Payer: Self-pay | Admitting: Neurology

## 2019-12-11 NOTE — Telephone Encounter (Signed)
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 She has a new patient appointment scheduled in July. Caller states she has not been seen in the office and is not an established patient. Advised to contact her PCP about her condition."

## 2019-12-11 NOTE — Telephone Encounter (Signed)
Pt states this was just a FYI before her appt. She will contact her pcp sometime today.

## 2019-12-18 ENCOUNTER — Other Ambulatory Visit (HOSPITAL_COMMUNITY): Payer: Self-pay

## 2019-12-18 ENCOUNTER — Other Ambulatory Visit: Payer: Self-pay

## 2019-12-18 ENCOUNTER — Ambulatory Visit (HOSPITAL_COMMUNITY)
Admission: RE | Admit: 2019-12-18 | Discharge: 2019-12-18 | Disposition: A | Discharge status: Home / Self Care | Payer: Self-pay | Source: Ambulatory Visit | Attending: Gastroenterology | Admitting: Gastroenterology

## 2019-12-18 DIAGNOSIS — Z8619 Personal history of other infectious and parasitic diseases: Secondary | ICD-10-CM | POA: Insufficient documentation

## 2019-12-18 DIAGNOSIS — R1310 Dysphagia, unspecified: Secondary | ICD-10-CM

## 2019-12-23 ENCOUNTER — Other Ambulatory Visit: Payer: Self-pay | Admitting: Family Medicine

## 2019-12-23 DIAGNOSIS — L299 Pruritus, unspecified: Secondary | ICD-10-CM

## 2019-12-23 DIAGNOSIS — L309 Dermatitis, unspecified: Secondary | ICD-10-CM

## 2019-12-24 ENCOUNTER — Ambulatory Visit (HOSPITAL_COMMUNITY)
Admission: RE | Admit: 2019-12-24 | Discharge: 2019-12-24 | Disposition: A | Discharge status: Home / Self Care | Payer: Self-pay | Source: Ambulatory Visit | Attending: Gastroenterology | Admitting: Gastroenterology

## 2019-12-24 ENCOUNTER — Ambulatory Visit (HOSPITAL_COMMUNITY): Admission: RE | Admit: 2019-12-24 | Payer: Self-pay | Source: Ambulatory Visit

## 2019-12-24 ENCOUNTER — Ambulatory Visit: Payer: Self-pay | Admitting: *Deleted

## 2019-12-24 ENCOUNTER — Ambulatory Visit
Admission: RE | Admit: 2019-12-24 | Discharge: 2019-12-24 | Disposition: A | Discharge status: Home / Self Care | Payer: No Typology Code available for payment source | Source: Ambulatory Visit | Attending: Obstetrics and Gynecology | Admitting: Obstetrics and Gynecology

## 2019-12-24 ENCOUNTER — Other Ambulatory Visit: Payer: Self-pay

## 2019-12-24 VITALS — BP 136/92 | Temp 97.3°F | Wt 225.0 lb

## 2019-12-24 DIAGNOSIS — R1310 Dysphagia, unspecified: Secondary | ICD-10-CM

## 2019-12-24 DIAGNOSIS — Z1239 Encounter for other screening for malignant neoplasm of breast: Secondary | ICD-10-CM

## 2019-12-24 DIAGNOSIS — Z1231 Encounter for screening mammogram for malignant neoplasm of breast: Secondary | ICD-10-CM

## 2019-12-24 DIAGNOSIS — R1312 Dysphagia, oropharyngeal phase: Secondary | ICD-10-CM

## 2019-12-24 MED ORDER — HYDROXYZINE HCL 25 MG PO TABS: 25.0000 mg | ORAL_TABLET | Freq: Three times a day (TID) | ORAL | 0 refills | Status: DC | PRN

## 2019-12-24 MED ORDER — CLOTRIMAZOLE-BETAMETHASONE 1-0.05 % EX CREA: 1.0000 "application " | TOPICAL_CREAM | Freq: Two times a day (BID) | CUTANEOUS | 0 refills | Status: DC

## 2019-12-24 NOTE — Progress Notes (Signed)
[name] is a 54 y.o. female who presents to [facility] clinic today with no complaints.    Pap Smear: Pap smear not completed today. Last Pap smear was 16 years ago and normal per patient. Per patient has no history of an abnormal Pap smear. Patient has a history of a hysterectomy in 2009 due to fibroids. Patient doesn't need any further Pap smears due to her history of a hysterectomy for benign reasons per BCCCP and ASCCP guidelines. No Pap smear results are in Epic.    Physical exam: Breasts Breasts symmetrical. No skin abnormalities bilateral breasts. No nipple retraction bilateral breasts. No nipple discharge bilateral breasts. No lymphadenopathy. No lumps palpated bilateral breasts. No complaints of pain or tenderness on exam.       Pelvic/Bimanual Pap is not indicated today per BCCCP guidelines.   Smoking History: Patient quit smoking 6 months ago.   Patient Navigation: Patient education provided. Access to services provided for patient through [facility] program.   Colorectal Cancer Screening: Per patient had a colonoscopy completed in January 2020 at [facility] in [place]. No complaints today.    Breast and Cervical Cancer Risk Assessment: Patient has no family history of breast cancer, known genetic mutations, or radiation treatment to the chest before age 42. Patient has no history of cervical dysplasia. Patient has a history of being immunocompromised. Patient has no history of DES exposure in-utero.  Risk Assessment    Risk Scores      12/24/2019 09/16/2018   Last edited by: Loletta Parish, RN Armond Hang, LPN   5-year risk: 1 % 1 %   Lifetime risk: 6.2 % 6.3 %         A: BCCCP exam without pap smear  P: Referred patient to the [facility] for a screening mammogram. Appointment scheduled Thursday, December 24, 2019 at 0930 on the mobile unit.  Loletta Parish, RN 12/24/2019 9:58 AM

## 2019-12-24 NOTE — Patient Instructions (Signed)
Explained breast self awareness with Cheryl Thompson. Patient did not need a Pap smear today due to patient has a history of a hysterectomy for benign reasons. Let patient know that she doesn't need any further Pap smears due to her history of a hysterectomy for benign reasons. Referred patient to the [facility] for a screening mammogram. Appointment scheduled Thursday, December 24, 2019 at 0930 on the mobile unit. Patient aware of appointment and will be there. Let patient know the [facility] will follow up with her within the next couple weeks with results of her mammogram by letter or phone. Cheryl Thompson verbalized understanding.  Delaina Fetsch, [place], RN 10:03 AM

## 2019-12-29 ENCOUNTER — Telehealth: Payer: Self-pay | Admitting: Family Medicine

## 2019-12-29 NOTE — Telephone Encounter (Signed)
Returned patient call , pt stated that she is having a reaction to medication , rash on back side. Informed patient that she will need to come in to be seen.

## 2019-12-31 ENCOUNTER — Ambulatory Visit: Payer: No Typology Code available for payment source | Admitting: Nurse Practitioner

## 2020-01-12 ENCOUNTER — Encounter: Payer: Self-pay | Admitting: Family Medicine

## 2020-01-12 ENCOUNTER — Other Ambulatory Visit: Payer: Self-pay

## 2020-01-12 ENCOUNTER — Ambulatory Visit (INDEPENDENT_AMBULATORY_CARE_PROVIDER_SITE_OTHER): Payer: Self-pay | Admitting: Family Medicine

## 2020-01-12 VITALS — BP 145/84 | HR 81 | Temp 98.0°F | Wt 226.0 lb

## 2020-01-12 DIAGNOSIS — L309 Dermatitis, unspecified: Secondary | ICD-10-CM

## 2020-01-12 DIAGNOSIS — M2550 Pain in unspecified joint: Secondary | ICD-10-CM

## 2020-01-12 MED ORDER — SARNA 0.5-0.5 % EX LOTN: 1.0000 "application " | TOPICAL_LOTION | CUTANEOUS | 0 refills | Status: DC | PRN

## 2020-01-12 MED ORDER — IBUPROFEN 800 MG PO TABS: ORAL_TABLET | ORAL | 0 refills | Status: DC

## 2020-01-12 MED ORDER — HYDROXYZINE HCL 25 MG PO TABS: 25.0000 mg | ORAL_TABLET | Freq: Three times a day (TID) | ORAL | 1 refills | Status: DC | PRN

## 2020-01-12 NOTE — Patient Instructions (Signed)
Ketogenic Eating Plan, Adult A ketogenic eating plan is a diet that is very low in carbohydrates, moderately low in protein, and very high in fat. Your body normally gets energy from eating carbohydrates. If you limit carbohydrates in your diet, your body will start to use stored fat as an energy source. When fat is broken down for energy, it enters your blood as a substance called ketones. A ketogenic eating plan relies mostly on ketones for energy. This eating plan can cause rapid weight loss because the body burns fat for fuel. What are the benefits of a ketogenic eating plan? Epilepsy A ketogenic diet is sometimes used to treat epilepsy in children who have seizures that are not helped by medicines. As a treatment for epilepsy in children, ketogenic eating plans have been well studied and used for many years. This type of diet is usually started in the hospital and carefully monitored by a health care team. It is usually tried for several months to see if it will lessen seizures. Other conditions Ketogenic eating plans have also been studied to help treat other conditions, including:  Cancer.  Type 2 diabetes.  Alzheimer's disease.  Parkinson's disease.  Autism.  Multiple sclerosis. More studies need to be done to see how well this eating plan works for these and other conditions and what the long-term side effects might be. What are the side effects of a ketogenic eating plan? Side effects of a ketogenic diet may include:  Vitamin deficiencies.  Loss of body fluids (dehydration).  Bad breath.  Nausea.  Hunger.  Difficulty passing stool (constipation).  Problems with menstrual periods.  Inflammation of the pancreas.  Kidney stones or gall bladder stones.  Bone fractures.  High cholesterol. What are tips for following this plan? Reading food labels  Look for foods that have a low glycemic index (GI) label.  Read food ingredient lists to check for any hidden or  added sugar.  Check food labels for the number of grams of carbohydrates and protein. This ensures that you are eating the right amounts of these nutrients. This is important. Cooking  Carefully measure or weigh foods.  Make desserts using ketogenic or low GI recipes.  Avoid cooking with sauces that contain added sugar, such as barbecue sauce or ketchup. Meal planning  Aim for a daily meal and snack time schedule that you can follow consistently.  Every meal will include high-fat items such as avocado, cream, butter, or mayonnaise. General information   Buy a gram scale to weigh foods. This is necessary to follow this diet correctly and accurately. Your dietitian will teach you how to use one for this diet.  Ask your health care provider what steps you can take to avoid side effects of this eating plan, such as constipation or kidney stones.  Take vitamin and mineral supplements only as told by a health care provider or dietitian.  Work with your dietitian and health care provider to handle the key challenges of this diet and to help you stay on track. What foods should I eat?  Fruits Fresh pineapple, peaches, and apples. Other fresh and frozen fruits in small amounts. Vegetables Lettuce. Beets. Bok choy. Eggplant. Tomatoes. Turnips. Cucumbers. Peppers. Radishes. Cauliflower. Zucchini. Fennel. Swiss chard. Grains Whole wheat bread. Bran cereal. Brown rice. Whole wheat pasta. Low GI cereals. Meats and other proteins Meat, poultry, and fish. Eggs. Egg substitutes. Nuts, seeds, lentils, and split peas in small amounts. Berniece Salines. Dairy Cheese in moderate amounts. Beverages Plain water.  Sugar-free, caffeine-free beverages. Mineral water or club soda. Caffeine-free, carbohydrate-free herbal tea. Fats and oils Avocado. Cream. Sour cream. Cream cheese. Butter. Plant-based oils, such as olive, canola, and sunflower. Margarine. Mayonnaise. Sweets and desserts Any homemade sweets or  desserts made using ketogenic diet recipes. The items listed above may not be a complete list of recommended foods and beverages. Contact a dietitian for more information. What foods should I avoid? Fruits Fruit juice. Fruits packed in syrups. Dried or candied fruits. Vegetables Corn. Potatoes. Peas. Grains All bread, dry cereals, and cooked cereals with added sugar. Baked goods. Crackers and pretzels. Meats and other proteins Meat, poultry, or fish prepared with flour or breading. Nut butters with added sugar. Beans. Dairy Milk. Yogurt. Fats and oils Salad dressings with added sugar. Gravies. Beverages Sugar-sweetened teas, coffee drinks, or soft drinks. Juice. Sports drinks. Sweets and desserts All sweets and desserts, unless the dessert is homemade using ketogenic diet recipes. The items listed above may not be a complete list of foods and beverages to avoid. Contact a dietitian for more information. Summary  A ketogenic eating plan is a diet that is very low in carbohydrates, moderately low in protein, and very high in fat.  Aim for a daily meal and snack time schedule that you can follow consistently.  Buy a gram scale to weigh foods. This is necessary to follow this diet correctly and accurately. Your dietitian will teach you how to use one for this diet.  Work closely with your health care provider and a dietitian while you are following a ketogenic eating plan. This information is not intended to replace advice given to you by your health care provider. Make sure you discuss any questions you have with your health care provider. Document Revised: 11/07/2017 Document Reviewed: 11/07/2017 Elsevier Patient Education  [place].
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
## 2020-01-15 ENCOUNTER — Ambulatory Visit: Payer: Self-pay | Admitting: Neurology
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
## 2020-01-21 NOTE — Telephone Encounter (Signed)
In error

## 2020-02-02 ENCOUNTER — Ambulatory Visit: Payer: Self-pay | Admitting: Family Medicine

## 2020-02-23 ENCOUNTER — Ambulatory Visit: Payer: Self-pay | Admitting: Family Medicine

## 2020-02-23 ENCOUNTER — Telehealth: Payer: Self-pay | Admitting: Family Medicine

## 2020-02-23 ENCOUNTER — Other Ambulatory Visit: Payer: Self-pay | Admitting: Family Medicine

## 2020-02-23 DIAGNOSIS — M62838 Other muscle spasm: Secondary | ICD-10-CM

## 2020-02-23 MED ORDER — CYCLOBENZAPRINE HCL 5 MG PO TABS: 5.0000 mg | ORAL_TABLET | Freq: Every day | ORAL | 1 refills | Status: DC

## 2020-02-23 NOTE — Progress Notes (Signed)
Meds ordered this encounter  Medications  . cyclobenzaprine (FLEXERIL) 5 MG tablet    Sig: Take 1 tablet (5 mg total) by mouth at bedtime.    Dispense:  30 tablet    Refill:  1    Order Specific Question:   Supervising Provider    Answer:   Tresa Garter [[id number]]     [name]  APRN, MSN, FNP-C [facility] [address] [place], [place] [address] [phone]

## 2020-02-24 ENCOUNTER — Other Ambulatory Visit: Payer: Self-pay

## 2020-02-24 ENCOUNTER — Other Ambulatory Visit (HOSPITAL_COMMUNITY): Payer: Self-pay

## 2020-02-24 ENCOUNTER — Telehealth: Payer: Self-pay

## 2020-02-24 DIAGNOSIS — R1310 Dysphagia, unspecified: Secondary | ICD-10-CM

## 2020-02-24 DIAGNOSIS — R1312 Dysphagia, oropharyngeal phase: Secondary | ICD-10-CM

## 2020-02-24 NOTE — Telephone Encounter (Signed)
Placed a new order for modified barium swallow study, lm on vm for speech pathology to return my call so that I can get patient scheduled.

## 2020-02-24 NOTE — Telephone Encounter (Signed)
Done

## 2020-02-25 NOTE — Telephone Encounter (Signed)
Patient scheduled for modified barium swallow study on 03/01/20 at 1 pm, with a 12:45 pm arrival time, sent patient a message via My Chart

## 2020-03-01 ENCOUNTER — Other Ambulatory Visit: Payer: Self-pay

## 2020-03-01 ENCOUNTER — Ambulatory Visit (HOSPITAL_COMMUNITY)
Admission: RE | Admit: 2020-03-01 | Discharge: 2020-03-01 | Disposition: A | Discharge status: Home / Self Care | Payer: No Typology Code available for payment source | Source: Ambulatory Visit | Attending: Gastroenterology | Admitting: Gastroenterology

## 2020-03-01 ENCOUNTER — Ambulatory Visit (HOSPITAL_COMMUNITY): Payer: No Typology Code available for payment source

## 2020-03-01 DIAGNOSIS — R1312 Dysphagia, oropharyngeal phase: Secondary | ICD-10-CM

## 2020-03-09 ENCOUNTER — Other Ambulatory Visit: Payer: Self-pay

## 2020-03-09 ENCOUNTER — Ambulatory Visit (HOSPITAL_COMMUNITY)
Admission: RE | Admit: 2020-03-09 | Discharge: 2020-03-09 | Disposition: A | Discharge status: Home / Self Care | Payer: No Typology Code available for payment source | Source: Ambulatory Visit | Attending: Gastroenterology | Admitting: Gastroenterology

## 2020-03-09 DIAGNOSIS — R1310 Dysphagia, unspecified: Secondary | ICD-10-CM | POA: Insufficient documentation

## 2020-03-15 ENCOUNTER — Ambulatory Visit: Payer: Self-pay

## 2020-03-15 ENCOUNTER — Ambulatory Visit (INDEPENDENT_AMBULATORY_CARE_PROVIDER_SITE_OTHER): Payer: Self-pay | Admitting: Family Medicine

## 2020-03-15 ENCOUNTER — Encounter: Payer: Self-pay | Admitting: Family Medicine

## 2020-03-15 ENCOUNTER — Other Ambulatory Visit: Payer: Self-pay

## 2020-03-15 VITALS — BP 148/89 | HR 82 | Ht 69.0 in | Wt 225.0 lb

## 2020-03-15 DIAGNOSIS — M1712 Unilateral primary osteoarthritis, left knee: Secondary | ICD-10-CM

## 2020-03-15 DIAGNOSIS — M2550 Pain in unspecified joint: Secondary | ICD-10-CM

## 2020-03-15 DIAGNOSIS — M11261 Other chondrocalcinosis, right knee: Secondary | ICD-10-CM

## 2020-03-15 MED ORDER — TRIAMCINOLONE ACETONIDE 40 MG/ML IJ SUSP
40.0000 mg | Freq: Once | INTRAMUSCULAR | Status: AC
  Administered 2020-03-15: 40 mg via INTRA_ARTICULAR

## 2020-03-15 NOTE — Patient Instructions (Signed)
Good to see you Please try ice  I will call with the lab results from today   Please send me a message in MyChart with any questions or updates.  Please see me back in 4-6 weeks.   --Dr. Raeford Razor
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
## 2020-03-15 NOTE — Assessment & Plan Note (Addendum)
Acute on chronic in nature.  Mild effusion on exam.  Does have significant degenerative changes appreciated on previous imaging. -Counseled on home exercise therapy and supportive care. -Injection. -Could consider physical therapy or updated imaging. -Could consider Supartz injections

## 2020-03-15 NOTE — Assessment & Plan Note (Signed)
Multiple areas of joint pain.  Rheumatoid and lupus were found to be normal.  Did have a positive ANA with elevated sed rate.  Likely has some form of inflammatory process. -Sed rate, CRP, ANA panel -May need to consider low-dose prednisone.

## 2020-03-15 NOTE — Assessment & Plan Note (Signed)
Acute exacerbation.  No significant effusion.  Synovium does appear to be irritated. -Counseled on home exercise therapy and supportive care. -Injection.

## 2020-03-16 LAB — ANA,IFA RA DIAG PNL W/RFLX TIT/PATN: Rheumatoid fact SerPl-aCnc: <10 IU/mL (ref 0.0–13.9)

## 2020-03-16 LAB — SEDIMENTATION RATE: Sed Rate: 41 mm/hr — ABNORMAL HIGH (ref 0–40)

## 2020-03-16 LAB — C-REACTIVE PROTEIN: CRP: 3 mg/L (ref 0–10)

## 2020-03-17 ENCOUNTER — Telehealth: Payer: Self-pay | Admitting: Family Medicine

## 2020-03-17 NOTE — Telephone Encounter (Signed)
Patient called states has a missed call from our office--See notes below:    Left VM for patient. If she calls back please have her speak with a nurse/CMA and inform that her sed rate is elevated but her other labs could not be completed due to a complication of the sample. [facility] would have a better answer as to why it couldn't be completed. We will need to repeat this lab at some point to understand if there is an underlying inflammatory process.   If any questions then please take the best time and phone number to call and I will try to call her back.   Rosemarie Ax, MD [facility] 03/17/2020, 8:33 AM   --Dion Body

## 2020-03-17 NOTE — Telephone Encounter (Signed)
Left VM for patient. If she calls back please have her speak with a nurse/CMA and inform that her sed rate is elevated but her other labs could not be completed due to a complication of the sample. [facility] would have a better answer as to why it couldn't be completed. We will need to repeat this lab at some point to understand if there is an underlying inflammatory process.   If any questions then please take the best time and phone number to call and I will try to call her back.   Rosemarie Ax, MD [facility] 03/17/2020, 8:33 AM

## 2020-03-20 ENCOUNTER — Other Ambulatory Visit: Payer: Self-pay | Admitting: Family Medicine

## 2020-03-20 DIAGNOSIS — M2550 Pain in unspecified joint: Secondary | ICD-10-CM

## 2020-03-20 NOTE — Progress Notes (Signed)
Places ana panel since previous sample was compromised.   Rosemarie Ax, MD [facility] 03/20/2020, 8:24 AM

## 2020-03-22 ENCOUNTER — Ambulatory Visit (INDEPENDENT_AMBULATORY_CARE_PROVIDER_SITE_OTHER): Payer: Self-pay | Admitting: Family Medicine

## 2020-03-22 DIAGNOSIS — R0981 Nasal congestion: Secondary | ICD-10-CM

## 2020-03-22 DIAGNOSIS — R05 Cough: Secondary | ICD-10-CM

## 2020-03-22 DIAGNOSIS — B349 Viral infection, unspecified: Secondary | ICD-10-CM

## 2020-03-22 DIAGNOSIS — R0989 Other specified symptoms and signs involving the circulatory and respiratory systems: Secondary | ICD-10-CM

## 2020-03-22 DIAGNOSIS — R059 Cough, unspecified: Secondary | ICD-10-CM

## 2020-03-22 DIAGNOSIS — Z09 Encounter for follow-up examination after completed treatment for conditions other than malignant neoplasm: Secondary | ICD-10-CM

## 2020-03-22 DIAGNOSIS — R6889 Other general symptoms and signs: Secondary | ICD-10-CM

## 2020-03-22 MED ORDER — AZITHROMYCIN 250 MG PO TABS: ORAL_TABLET | ORAL | 0 refills | Status: DC

## 2020-03-22 MED ORDER — BENZONATATE 100 MG PO CAPS: 100.0000 mg | ORAL_CAPSULE | Freq: Two times a day (BID) | ORAL | 0 refills | Status: DC | PRN
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
## 2020-03-23 ENCOUNTER — Encounter: Payer: Self-pay | Admitting: Family Medicine

## 2020-03-28 ENCOUNTER — Other Ambulatory Visit: Payer: Self-pay | Admitting: Family Medicine

## 2020-03-30 LAB — ANA,IFA RA DIAG PNL W/RFLX TIT/PATN
ANA Titer 1: NEGATIVE
Cyclic Citrullin Peptide Ab: 10 units (ref 0–19)
Rheumatoid fact SerPl-aCnc: <10 IU/mL (ref 0.0–13.9)

## 2020-04-11 ENCOUNTER — Other Ambulatory Visit: Payer: Self-pay

## 2020-04-11 ENCOUNTER — Ambulatory Visit (INDEPENDENT_AMBULATORY_CARE_PROVIDER_SITE_OTHER): Payer: Self-pay | Admitting: Neurology

## 2020-04-11 ENCOUNTER — Encounter: Payer: Self-pay | Admitting: Neurology

## 2020-04-11 VITALS — BP 164/95 | HR 85 | Ht 69.0 in | Wt 230.0 lb

## 2020-04-11 DIAGNOSIS — G40909 Epilepsy, unspecified, not intractable, without status epilepticus: Secondary | ICD-10-CM

## 2020-04-11 DIAGNOSIS — G894 Chronic pain syndrome: Secondary | ICD-10-CM

## 2020-04-11 DIAGNOSIS — R200 Anesthesia of skin: Secondary | ICD-10-CM

## 2020-04-11 DIAGNOSIS — M791 Myalgia, unspecified site: Secondary | ICD-10-CM

## 2020-04-11 LAB — CK: Total CK: 166 U/L (ref 7–177)
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
## 2020-04-11 NOTE — Patient Instructions (Signed)
1.  Check B12, SPEP/IFE, CK, aldolase 2.  Nerve conduction study of left arm and leg. 3.  Follow up after testing.

## 2020-04-12 LAB — SJOGREN'S SYNDROME ANTIBODS(SSA + SSB)
SSA (Ro) (ENA) Antibody, IgG: <1 AI
SSB (La) (ENA) Antibody, IgG: <1 AI

## 2020-04-13 LAB — IMMUNOFIXATION, SERUM
IgA/Immunoglobulin A, Serum: 394 mg/dL — ABNORMAL HIGH (ref 87–352)
IgG (Immunoglobin G), Serum: 1790 mg/dL — ABNORMAL HIGH (ref 586–1602)
IgM (Immunoglobulin M), Srm: 44 mg/dL (ref 26–217)

## 2020-04-13 LAB — ALDOLASE: Aldolase: 4.5 U/L (ref <=8.1)

## 2020-04-13 LAB — PROTEIN ELECTROPHORESIS, SERUM
Albumin ELP: 3.9 g/dL (ref 3.8–4.8)
Alpha 1: 0.3 g/dL (ref 0.2–0.3)
Alpha 2: 0.8 g/dL (ref 0.5–0.9)
Beta 2: 0.4 g/dL (ref 0.2–0.5)
Beta Globulin: 0.6 g/dL (ref 0.4–0.6)
Gamma Globulin: 1.7 g/dL (ref 0.8–1.7)
Total Protein: 7.6 g/dL (ref 6.1–8.1)

## 2020-04-14 ENCOUNTER — Encounter: Payer: Self-pay | Admitting: Internal Medicine

## 2020-04-14 ENCOUNTER — Encounter: Payer: Self-pay | Admitting: Family Medicine

## 2020-04-14 ENCOUNTER — Ambulatory Visit (INDEPENDENT_AMBULATORY_CARE_PROVIDER_SITE_OTHER): Payer: No Typology Code available for payment source | Admitting: Family Medicine

## 2020-04-14 ENCOUNTER — Other Ambulatory Visit: Payer: Self-pay

## 2020-04-14 DIAGNOSIS — M11261 Other chondrocalcinosis, right knee: Secondary | ICD-10-CM

## 2020-04-14 DIAGNOSIS — M1712 Unilateral primary osteoarthritis, left knee: Secondary | ICD-10-CM

## 2020-04-14 DIAGNOSIS — M2550 Pain in unspecified joint: Secondary | ICD-10-CM

## 2020-04-14 NOTE — Assessment & Plan Note (Signed)
Pain has returned.  She feels like her knees are going to give way. -Would try a rolling walker. -Could consider updated imaging. -Could consider physical therapy.

## 2020-04-14 NOTE — Assessment & Plan Note (Signed)
Pain has returned and feels unstable.  Feels like her knees are getting give out from time to time. -Counseled on home exercise therapy and supportive care. -Can try the gel shots in the future.

## 2020-04-14 NOTE — Patient Instructions (Signed)
Good to see you Please try to get a rolling walker. Let me know if you can't.  Please fill out the paperwork  Please try the rayos as close to 10 pm as you can.  Please send me a message in MyChart with any questions or updates.  Please see me back in 4-6 weeks.   --Dr. Raeford Razor

## 2020-04-14 NOTE — Assessment & Plan Note (Signed)
She does feel like her pain is more severe since coming off of the daily ibuprofen.  She does get a good response of her overall symptoms with prednisone. -Provided Rayos samples.

## 2020-04-14 NOTE — Progress Notes (Signed)
Medication Samples have been provided to the patient.  Drug name: Rayos       Strength: 5 mg       Qty: 2 boxes  LOT: [id number]  Exp.Date: 01/2021  Dosing instructions: take 1 tablet by mouth as close to 10 pm as possible.  The patient has been instructed regarding the correct time, dose, and frequency of taking this medication, including desired effects and most common side effects.   Sherrie George, MA 10:36 AM 04/14/2020
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
## 2020-04-19 ENCOUNTER — Other Ambulatory Visit: Payer: Self-pay

## 2020-04-19 ENCOUNTER — Encounter: Payer: Self-pay | Admitting: Family Medicine

## 2020-04-19 ENCOUNTER — Ambulatory Visit (INDEPENDENT_AMBULATORY_CARE_PROVIDER_SITE_OTHER): Payer: Self-pay | Admitting: Family Medicine

## 2020-04-19 VITALS — BP 162/94 | HR 88 | Temp 98.1°F | Ht 69.0 in | Wt 229.0 lb

## 2020-04-19 DIAGNOSIS — E039 Hypothyroidism, unspecified: Secondary | ICD-10-CM

## 2020-04-19 DIAGNOSIS — F3163 Bipolar disorder, current episode mixed, severe, without psychotic features: Secondary | ICD-10-CM

## 2020-04-19 DIAGNOSIS — G40909 Epilepsy, unspecified, not intractable, without status epilepticus: Secondary | ICD-10-CM

## 2020-04-19 DIAGNOSIS — G629 Polyneuropathy, unspecified: Secondary | ICD-10-CM

## 2020-04-19 DIAGNOSIS — I10 Essential (primary) hypertension: Secondary | ICD-10-CM

## 2020-04-19 DIAGNOSIS — Z21 Asymptomatic human immunodeficiency virus [HIV] infection status: Secondary | ICD-10-CM

## 2020-04-19 DIAGNOSIS — R600 Localized edema: Secondary | ICD-10-CM

## 2020-04-19 LAB — POCT URINALYSIS DIPSTICK
Bilirubin, UA: NEGATIVE
Blood, UA: NEGATIVE
Glucose, UA: NEGATIVE
Ketones, UA: NEGATIVE
Leukocytes, UA: NEGATIVE
Nitrite, UA: NEGATIVE
Protein, UA: NEGATIVE
Spec Grav, UA: 1.015 (ref 1.010–1.025)
Urobilinogen, UA: 0.2 E.U./dL
pH, UA: 7 (ref 5.0–8.0)

## 2020-04-19 MED ORDER — PREGABALIN 75 MG PO CAPS: ORAL_CAPSULE | ORAL | 2 refills | Status: DC
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
## 2020-04-20 LAB — BASIC METABOLIC PANEL
BUN/Creatinine Ratio: 11 (ref 9–23)
BUN: 10 mg/dL (ref 6–24)
CO2: 21 mmol/L (ref 20–29)
Calcium: 9.9 mg/dL (ref 8.7–10.2)
Chloride: 102 mmol/L (ref 96–106)
Creatinine, Ser: 0.89 mg/dL (ref 0.57–1.00)
GFR calc Af Amer: 84 mL/min/{1.73_m2} (ref >59)
GFR calc non Af Amer: 73 mL/min/{1.73_m2} (ref >59)
Glucose: 125 mg/dL — ABNORMAL HIGH (ref 65–99)
Potassium: 4.2 mmol/L (ref 3.5–5.2)
Sodium: 139 mmol/L (ref 134–144)

## 2020-04-20 LAB — THYROID PANEL WITH TSH
Free Thyroxine Index: 2.5 (ref 1.2–4.9)
T3 Uptake Ratio: 25 % (ref 24–39)
T4, Total: 10 ug/dL (ref 4.5–12.0)
TSH: 1.31 u[IU]/mL (ref 0.450–4.500)

## 2020-04-20 LAB — MICROALBUMIN / CREATININE URINE RATIO
Creatinine, Urine: 51 mg/dL
Microalb/Creat Ratio: 13 mg/g creat (ref 0–29)
Microalbumin, Urine: 6.7 ug/mL

## 2020-04-25 ENCOUNTER — Other Ambulatory Visit: Payer: Self-pay

## 2020-04-25 ENCOUNTER — Telehealth: Payer: Self-pay

## 2020-04-25 ENCOUNTER — Ambulatory Visit (INDEPENDENT_AMBULATORY_CARE_PROVIDER_SITE_OTHER): Payer: Self-pay | Admitting: Internal Medicine

## 2020-04-25 ENCOUNTER — Encounter: Payer: Self-pay | Admitting: Internal Medicine

## 2020-04-25 VITALS — BP 168/106 | HR 91 | Temp 98.5°F | Ht 69.0 in | Wt 230.0 lb

## 2020-04-25 DIAGNOSIS — Z21 Asymptomatic human immunodeficiency virus [HIV] infection status: Secondary | ICD-10-CM

## 2020-04-25 DIAGNOSIS — Z23 Encounter for immunization: Secondary | ICD-10-CM

## 2020-04-25 DIAGNOSIS — B20 Human immunodeficiency virus [HIV] disease: Secondary | ICD-10-CM

## 2020-04-25 DIAGNOSIS — K7460 Unspecified cirrhosis of liver: Secondary | ICD-10-CM

## 2020-04-25 MED ORDER — SYMTUZA 800-150-200-10 MG PO TABS: 1.0000 | ORAL_TABLET | Freq: Every day | ORAL | 11 refills | Status: DC

## 2020-04-25 NOTE — Assessment & Plan Note (Signed)
Doing well from an HIV standpoint.  Continue Biktarvy and rtc 6 months

## 2020-04-25 NOTE — Telephone Encounter (Signed)
Call patient discussed labs, no further questions

## 2020-04-25 NOTE — Telephone Encounter (Signed)
-----   Message from [name], [place] sent at 04/20/2020  3:53 PM EDT ----- Regarding: lab results Please inform patient that thyroid studies are within a normal range. No medication changes are warranted. Remind her that we increase her dosage of lyrica to 75 mg twice daily for greater pain control. She can follow up in office as scheduled.    [name]  APRN, MSN, FNP-C Patient [facility] [address] [place], [place] [address] [phone]

## 2020-04-25 NOTE — Progress Notes (Signed)
   Subjective:    Patient ID: Cheryl Thompson, female    DOB: Jan 10, 1966, 54 y.o.   MRN: [id number]  HPI Here for follow up of HIV She continues on Symtuza and no missed doses. No labs prior to the visit.  Has known cirrhosis and last ultrasound wnl.   She still has issues with chronic pain and work up so far appears to be unrevealing.  She is followed by neurology, Dr. Tomi Likens.     Review of Systems  Constitutional: Negative for fatigue.  Gastrointestinal: Negative for diarrhea.  Skin: Negative for rash.       Objective:   Physical Exam Constitutional:      Appearance: Normal appearance.  Eyes:     General: No scleral icterus. Cardiovascular:     Rate and Rhythm: Normal rate and regular rhythm.     Heart sounds: No murmur heard.   Pulmonary:     Effort: Pulmonary effort is normal. No respiratory distress.     Breath sounds: Normal breath sounds.  Neurological:     General: No focal deficit present.     Mental Status: She is alert.  Psychiatric:        Mood and Affect: Mood normal.   SH: remains drug and alcohol free        Assessment & Plan:

## 2020-04-25 NOTE — Addendum Note (Signed)
Addended by: Thayer Headings on: 04/25/2020 03:01 PM   Modules accepted: Orders

## 2020-04-25 NOTE — Assessment & Plan Note (Signed)
Will continue with HCC screening 

## 2020-04-26 LAB — T-HELPER CELL (CD4) - (RCID CLINIC ONLY)
CD4 % Helper T Cell: 48 % (ref 33–65)
CD4 T Cell Abs: 1356 /uL (ref 400–1790)

## 2020-04-27 LAB — HIV-1 RNA QUANT-NO REFLEX-BLD
HIV 1 RNA Quant: 64 Copies/mL — ABNORMAL HIGH
HIV-1 RNA Quant, Log: 1.81 Log cps/mL — ABNORMAL HIGH

## 2020-05-01 DIAGNOSIS — R29898 Other symptoms and signs involving the musculoskeletal system: Secondary | ICD-10-CM | POA: Insufficient documentation

## 2020-05-01 DIAGNOSIS — R531 Weakness: Secondary | ICD-10-CM | POA: Insufficient documentation

## 2020-05-03 ENCOUNTER — Encounter: Payer: Self-pay | Admitting: Family Medicine

## 2020-05-03 ENCOUNTER — Ambulatory Visit (INDEPENDENT_AMBULATORY_CARE_PROVIDER_SITE_OTHER): Payer: Self-pay | Admitting: Family Medicine

## 2020-05-03 ENCOUNTER — Other Ambulatory Visit: Payer: Self-pay

## 2020-05-03 VITALS — BP 136/82 | HR 97 | Temp 97.5°F | Ht 69.0 in | Wt 231.8 lb

## 2020-05-03 DIAGNOSIS — G629 Polyneuropathy, unspecified: Secondary | ICD-10-CM

## 2020-05-03 DIAGNOSIS — E559 Vitamin D deficiency, unspecified: Secondary | ICD-10-CM

## 2020-05-03 DIAGNOSIS — E1165 Type 2 diabetes mellitus with hyperglycemia: Secondary | ICD-10-CM

## 2020-05-03 DIAGNOSIS — I10 Essential (primary) hypertension: Secondary | ICD-10-CM

## 2020-05-03 DIAGNOSIS — E039 Hypothyroidism, unspecified: Secondary | ICD-10-CM

## 2020-05-03 MED ORDER — METFORMIN HCL 500 MG PO TABS: 500.0000 mg | ORAL_TABLET | Freq: Every day | ORAL | 1 refills | Status: DC

## 2020-05-03 MED ORDER — LEVOTHYROXINE SODIUM 175 MCG PO TABS: 175.0000 ug | ORAL_TABLET | Freq: Every day | ORAL | 1 refills | Status: DC

## 2020-05-03 NOTE — Patient Instructions (Addendum)
Hypertension, Adult Hypertension is another name for high blood pressure. High blood pressure forces your heart to work harder to pump blood. This can cause problems over time. There are two numbers in a blood pressure reading. There is a top number (systolic) over a bottom number (diastolic). It is best to have a blood pressure that is below 120/80. Healthy choices can help lower your blood pressure, or you may need medicine to help lower it. What are the causes? The cause of this condition is not known. Some conditions may be related to high blood pressure. What increases the risk?  Smoking.  Having type 2 diabetes mellitus, high cholesterol, or both.  Not getting enough exercise or physical activity.  Being overweight.  Having too much fat, sugar, calories, or salt (sodium) in your diet.  Drinking too much alcohol.  Having long-term (chronic) kidney disease.  Having a family history of high blood pressure.  Age. Risk increases with age.  Race. You may be at higher risk if you are African American.  Gender. Men are at higher risk than women before age 8. After age 41, women are at higher risk than men.  Having obstructive sleep apnea.  Stress. What are the signs or symptoms?  High blood pressure may not cause symptoms. Very high blood pressure (hypertensive crisis) may cause: ? Headache. ? Feelings of worry or nervousness (anxiety). ? Shortness of breath. ? Nosebleed. ? A feeling of being sick to your stomach (nausea). ? Throwing up (vomiting). ? Changes in how you see. ? Very bad chest pain. ? Seizures. How is this treated?  This condition is treated by making healthy lifestyle changes, such as: ? Eating healthy foods. ? Exercising more. ? Drinking less alcohol.  Your health care provider may prescribe medicine if lifestyle changes are not enough to get your blood pressure under control, and if: ? Your top number is above 130. ? Your bottom number is above  80.  Your personal target blood pressure may vary. Follow these instructions at home: Eating and drinking   If told, follow the DASH eating plan. To follow this plan: ? Fill one half of your plate at each meal with fruits and vegetables. ? Fill one fourth of your plate at each meal with whole grains. Whole grains include whole-wheat pasta, brown rice, and whole-grain bread. ? Eat or drink low-fat dairy products, such as skim milk or low-fat yogurt. ? Fill one fourth of your plate at each meal with low-fat (lean) proteins. Low-fat proteins include fish, chicken without skin, eggs, beans, and tofu. ? Avoid fatty meat, cured and processed meat, or chicken with skin. ? Avoid pre-made or processed food.  Eat less than 1,500 mg of salt each day.  Do not drink alcohol if: ? Your doctor tells you not to drink. ? You are pregnant, may be pregnant, or are planning to become pregnant.  If you drink alcohol: ? Limit how much you use to:  0-1 drink a day for women.  0-2 drinks a day for men. ? Be aware of how much alcohol is in your drink. In the [place] one drink equals one 12 oz bottle of beer (355 mL), one 5 oz glass of wine (148 mL), or one 1 oz glass of hard liquor (44 mL). Lifestyle   Work with your doctor to stay at a healthy weight or to lose weight. Ask your doctor what the best weight is for you.  Get at least 30 minutes of exercise most  days of the week. This may include walking, swimming, or biking.  Get at least 30 minutes of exercise that strengthens your muscles (resistance exercise) at least 3 days a week. This may include lifting weights or doing Pilates.  Do not use any products that contain nicotine or tobacco, such as cigarettes, e-cigarettes, and chewing tobacco. If you need help quitting, ask your doctor.  Check your blood pressure at home as told by your doctor.  Keep all follow-up visits as told by your doctor. This is important. Medicines  Take over-the-counter  and prescription medicines only as told by your doctor. Follow directions carefully.  Do not skip doses of blood pressure medicine. The medicine does not work as well if you skip doses. Skipping doses also puts you at risk for problems.  Ask your doctor about side effects or reactions to medicines that you should watch for. Contact a doctor if you:  Think you are having a reaction to the medicine you are taking.  Have headaches that keep coming back (recurring).  Feel dizzy.  Have swelling in your ankles.  Have trouble with your vision. Get help right away if you:  Get a very bad headache.  Start to feel mixed up (confused).  Feel weak or numb.  Feel faint.  Have very bad pain in your: ? Chest. ? Belly (abdomen).  Throw up more than once.  Have trouble breathing. Summary  Hypertension is another name for high blood pressure.  High blood pressure forces your heart to work harder to pump blood.  For most people, a normal blood pressure is less than 120/80.  Making healthy choices can help lower blood pressure. If your blood pressure does not get lower with healthy choices, you may need to take medicine. This information is not intended to replace advice given to you by your health care provider. Make sure you discuss any questions you have with your health care provider. Document Revised: 03/12/2018 Document Reviewed: 03/12/2018 Elsevier Patient Education  [place]. Metformin extended-release tablets What is this medicine? METFORMIN (met FOR min) is used to treat type 2 diabetes. It helps to control blood sugar. Treatment is combined with diet and exercise. This medicine can be used alone or with other medicines for diabetes. This medicine may be used for other purposes; ask your health care provider or pharmacist if you have questions. COMMON BRAND NAME(S): Fortamet, Glucophage XR, Glumetza What should I tell my health care provider before I take this  medicine? They need to know if you have any of these conditions:  anemia  dehydration  heart disease  frequently drink alcohol-containing beverages  kidney disease  liver disease  polycystic ovary syndrome  serious infection or injury  vomiting  an unusual or allergic reaction to metformin, other medicines, foods, dyes, or preservatives  pregnant or trying to get pregnant  breast-feeding How should I use this medicine? Take this medicine by mouth with a glass of water. Follow the directions on the prescription label. Take this medicine with food. Take your medicine at regular intervals. Do not take your medicine more often than directed. Do not stop taking except on your doctor's advice. Talk to your pediatrician regarding the use of this medicine in children. Special care may be needed. Overdosage: If you think you have taken too much of this medicine contact a poison control center or emergency room at once. NOTE: This medicine is only for you. Do not share this medicine with others. What if I miss  a dose? If you miss a dose, take it as soon as you can. If it is almost time for your next dose, take only that dose. Do not take double or extra doses. What may interact with this medicine? Do not take this medicine with any of the following medications:  certain contrast medicines given before X-rays, CT scans, MRI, or other procedures  dofetilide This medicine may also interact with the following medications:  acetazolamide  alcohol  certain antivirals for HIV or hepatitis  certain medicines for blood pressure, heart disease, irregular heart beat  cimetidine  dichlorphenamide  digoxin  diuretics  female hormones, like estrogens or progestins and birth control pills  glycopyrrolate  isoniazid  lamotrigine  memantine  methazolamide  metoclopramide  midodrine  niacin  phenothiazines like chlorpromazine, mesoridazine, prochlorperazine,  thioridazine  phenytoin  ranolazine  steroid medicines like prednisone or cortisone  stimulant medicines for attention disorders, weight loss, or to stay awake  thyroid medicines  topiramate  trospium  vandetanib  zonisamide This list may not describe all possible interactions. Give your health care provider a list of all the medicines, herbs, non-prescription drugs, or dietary supplements you use. Also tell them if you smoke, drink alcohol, or use illegal drugs. Some items may interact with your medicine. What should I watch for while using this medicine? Visit your doctor or health care professional for regular checks on your progress. A test called the HbA1C (A1C) will be monitored. This is a simple blood test. It measures your blood sugar control over the last 2 to 3 months. You will receive this test every 3 to 6 months. Learn how to check your blood sugar. Learn the symptoms of low and high blood sugar and how to manage them. Always carry a quick-source of sugar with you in case you have symptoms of low blood sugar. Examples include hard sugar candy or glucose tablets. Make sure others know that you can choke if you eat or drink when you develop serious symptoms of low blood sugar, such as seizures or unconsciousness. They must get medical help at once. Tell your doctor or health care professional if you have high blood sugar. You might need to change the dose of your medicine. If you are sick or exercising more than usual, you might need to change the dose of your medicine. Do not skip meals. Ask your doctor or health care professional if you should avoid alcohol. Many nonprescription cough and cold products contain sugar or alcohol. These can affect blood sugar. This medicine may cause ovulation in premenopausal women who do not have regular monthly periods. This may increase your chances of becoming pregnant. You should not take this medicine if you become pregnant or think you  may be pregnant. Talk with your doctor or health care professional about your birth control options while taking this medicine. Contact your doctor or health care professional right away if you think you are pregnant. The tablet shell for some brands of this medicine does not dissolve. This is normal. The tablet shell may appear whole in the stool. This is not a cause for concern. If you are going to need surgery, a MRI, CT scan, or other procedure, tell your doctor that you are taking this medicine. You may need to stop taking this medicine before the procedure. Wear a medical ID bracelet or chain, and carry a card that describes your disease and details of your medicine and dosage times. This medicine may cause a decrease in  folic acid and vitamin B12. You should make sure that you get enough vitamins while you are taking this medicine. Discuss the foods you eat and the vitamins you take with your health care professional. What side effects may I notice from receiving this medicine? Side effects that you should report to your doctor or health care professional as soon as possible:  allergic reactions like skin rash, itching or hives, swelling of the face, lips, or tongue  breathing problems  feeling faint or lightheaded, falls  muscle aches or pains  signs and symptoms of low blood sugar such as feeling anxious, confusion, dizziness, increased hunger, unusually weak or tired, sweating, shakiness, cold, irritable, headache, blurred vision, fast heartbeat, loss of consciousness  slow or irregular heartbeat  unusual stomach pain or discomfort  unusually tired or weak Side effects that usually do not require medical attention (report to your doctor or health care professional if they continue or are bothersome):  diarrhea  headache  heartburn  metallic taste in mouth  nausea  stomach gas, upset This list may not describe all possible side effects. Call your doctor for medical advice  about side effects. You may report side effects to FDA at 1-800-FDA-1088. Where should I keep my medicine? Keep out of the reach of children. Store at room temperature between 15 and 30 degrees C (59 and 86 degrees F). Protect from light. Throw away any unused medicine after the expiration date. NOTE: This sheet is a summary. It may not cover all possible information. If you have questions about this medicine, talk to your doctor, pharmacist, or health care provider.  2020 Elsevier/[employer] Standard (2017-08-08 18:58:32)
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
## 2020-05-11 ENCOUNTER — Ambulatory Visit (INDEPENDENT_AMBULATORY_CARE_PROVIDER_SITE_OTHER): Payer: Self-pay | Admitting: Neurology

## 2020-05-11 ENCOUNTER — Other Ambulatory Visit: Payer: Self-pay

## 2020-05-11 DIAGNOSIS — R200 Anesthesia of skin: Secondary | ICD-10-CM

## 2020-05-11 NOTE — Procedures (Signed)
[facility]  [place], [address]  [place], [place] [address] Tel: [phone] Fax:  [phone] Test Date:  05/11/2020  Patient: Cheryl Thompson DOB: [birth year] Physician: Narda Amber, DO  Sex: Female Height: 5\' 9"  Ref Phys: Metta Clines, D.O.  ID#: [id number] Temp: 35.0C Technician:    Patient Complaints: This is a 54 year old female referred for evaluation of numbness and tingling involving the left side.  NCV & EMG Findings: Extensive electrodiagnostic testing of the left upper and lower extremity shows:  1. All sensory responses including the left median, ulnar, mixed palmar, sural, and superficial peroneal nerves are within normal limits. 2. All motor responses including the left median, ulnar, peroneal, and tibial nerves are within normal limits. 3. There is no evidence of active or chronic motor axonal loss changes affecting any of the tested muscles.  Motor unit configuration and recruitment pattern is within normal limits.  Impression: This is a normal study of the left upper and lower extremities.  In particular, there is no evidence of a large fiber sensorimotor polyneuropathy or cervical/lumbosacral radiculopathy.   ___________________________ Narda Amber, DO    Nerve Conduction Studies Anti Sensory Summary Table   Stim Site NR Peak (ms) Norm Peak (ms) P-T Amp (V) Norm P-T Amp  Left Median Anti Sensory (2nd Digit)  35C  Wrist    2.9 <3.6 29.8 >15  Left Sup Peroneal Anti Sensory (Ant Lat Mall)  35C  12 cm    2.6 <4.6 19.8 >4  Left Sural Anti Sensory (Lat Mall)  35C  Calf    2.1 <4.6 25.3 >4  Left Ulnar Anti Sensory (5th Digit)  35C  Wrist    2.8 <3.1 29.9 >10   Motor Summary Table   Stim Site NR Onset (ms) Norm Onset (ms) O-P Amp (mV) Norm O-P Amp Site1 Site2 Delta-0 (ms) Dist (cm) Vel (m/s) Norm Vel (m/s)  Left Median Motor (Abd Poll Brev)  35C  Wrist    3.1 <4.0 11.4 >6 Elbow Wrist 4.9 31.0 63 >50  Elbow    8.0  10.6         Left  Peroneal Motor (Ext Dig Brev)  35C  Ankle    3.9 <6.0 9.5 >2.5 B Fib Ankle 8.0 38.0 48 >40  B Fib    11.9  8.9  Poplt B Fib 1.6 8.0 50 >40  Poplt    13.5  8.7         Left Tibial Motor (Abd Hall Brev)  35C  Ankle    3.0 <6.0 4.4 >4 Knee Ankle 10.4 42.0 40 >40  Knee    13.4  2.5         Left Ulnar Motor (Abd Dig Minimi)  35C  Wrist    2.5 <3.1 7.4 >7 B Elbow Wrist 4.6 23.0 50 >50  B Elbow    7.1  7.1  A Elbow B Elbow 1.9 10.0 53 >50  A Elbow    9.0  6.5          Comparison Summary Table   Stim Site NR Peak (ms) Norm Peak (ms) P-T Amp (V) Site1 Site2 Delta-P (ms) Norm Delta (ms)  Left Median/Ulnar Palm Comparison (Wrist - 8cm)  35C  Median Palm    1.7 <2.2 63.8 Median Palm Ulnar Palm 0.1   Ulnar Palm    1.8 <2.2 18.5       EMG   Side Muscle Ins Act Fibs Psw Fasc Number Recrt Dur Dur. Amp  Amp. Poly Poly. Comment  Left AntTibialis Nml Nml Nml Nml Nml Nml Nml Nml Nml Nml Nml Nml N/A  Left Gastroc Nml Nml Nml Nml Nml Nml Nml Nml Nml Nml Nml Nml N/A  Left Flex Dig Long Nml Nml Nml Nml Nml Nml Nml Nml Nml Nml Nml Nml N/A  Left RectFemoris Nml Nml Nml Nml Nml Nml Nml Nml Nml Nml Nml Nml N/A  Left GluteusMed Nml Nml Nml Nml Nml Nml Nml Nml Nml Nml Nml Nml N/A  Left 1stDorInt Nml Nml Nml Nml Nml Nml Nml Nml Nml Nml Nml Nml N/A  Left PronatorTeres Nml Nml Nml Nml Nml Nml Nml Nml Nml Nml Nml Nml N/A  Left Biceps Nml Nml Nml Nml Nml Nml Nml Nml Nml Nml Nml Nml N/A  Left Triceps Nml Nml Nml Nml Nml Nml Nml Nml Nml Nml Nml Nml N/A  Left Deltoid Nml Nml Nml Nml Nml Nml Nml Nml Nml Nml Nml Nml N/A      Waveforms:

## 2020-05-12 ENCOUNTER — Telehealth: Payer: Self-pay

## 2020-05-12 DIAGNOSIS — R200 Anesthesia of skin: Secondary | ICD-10-CM

## 2020-05-12 NOTE — Telephone Encounter (Signed)
Pt advised of her EMG results, Order for referral for punch skin biopsy added.

## 2020-05-12 NOTE — Telephone Encounter (Signed)
-----   Message from Pieter Partridge, DO sent at 05/12/2020  7:29 AM EDT ----- Nerve study is normal.  I would like to proceed with punch skin biopsy to assess for neuropathy.

## 2020-05-16 ENCOUNTER — Ambulatory Visit: Payer: No Typology Code available for payment source | Admitting: Family Medicine

## 2020-05-24 ENCOUNTER — Ambulatory Visit: Payer: No Typology Code available for payment source | Admitting: Family Medicine

## 2020-05-26 ENCOUNTER — Telehealth: Payer: Self-pay | Admitting: Neurology

## 2020-05-26 NOTE — Telephone Encounter (Signed)
Pt reports she was going about her day and then she felt her legs get weak once she step on her first step of back steps her right leg gave out from under her and she went down. Pt ended up breaking her left leg. Pt states that, that leg was already  numb and had barley any feeling in it to start with.

## 2020-05-26 NOTE — Telephone Encounter (Signed)
Tried calling pt, No answer. LMOVM to see if the pt had any other symptoms besides Bilateral leg weakness.

## 2020-05-26 NOTE — Telephone Encounter (Signed)
Patient called and said, "I need to report to Dr. Tomi Likens that last Saturday, 05/21/20, I went to go down some stairs but suddenly had no feeling in my left leg. I fell down and broke my leg."

## 2020-05-26 NOTE — Telephone Encounter (Signed)
Noted.  These are similar symptoms for reason that she was referred to me.  Continue ongoing workup.

## 2020-06-07 ENCOUNTER — Telehealth: Payer: Self-pay

## 2020-06-07 NOTE — Telephone Encounter (Signed)
Prior authorization for Symtuza initiated via Cover My Meds.  Beryle Flock, RN

## 2020-06-13 ENCOUNTER — Other Ambulatory Visit: Payer: Self-pay | Admitting: Nurse Practitioner

## 2020-06-13 DIAGNOSIS — M2550 Pain in unspecified joint: Secondary | ICD-10-CM

## 2020-06-13 MED ORDER — IBUPROFEN 800 MG PO TABS: ORAL_TABLET | ORAL | 0 refills | Status: DC

## 2020-06-13 MED ORDER — AMLODIPINE BESYLATE 10 MG PO TABS: 10.0000 mg | ORAL_TABLET | Freq: Every day | ORAL | 3 refills | Status: DC

## 2020-06-30 NOTE — Telephone Encounter (Signed)
Received prior authorization request from [facility], status of outcome is "unknown." RN called [facility], they state that they do not even carry Symtuza and that they have never filled that prescription for her.   Patient has previously filled at the [facility] in [place]. RN spoke to [place] at [place] who states patient had her medication transferred to [facility]. RN confirmed that patient's HMAP is up to date and requested the Symtuza be transferred back to [place].   Attempted to call patient to let her know that she will need to use an HMAP approved pharmacy. No answer, RN left HIPAA compliant message on patient's identified voicemail.   Beryle Flock, RN

## 2020-06-30 NOTE — Telephone Encounter (Signed)
Patient returned call. Rico Junker financial counselor will reach out to patient once follow up decision has been made regarding ADAP application. Patient made aware that her prescriptions must be fill at the [facility] on [facility] or [facility] out of [facility] for mail delivery. Patient prefers mail delivery pharmacy.  Eugenia Mcalpine

## 2020-06-30 NOTE — Telephone Encounter (Signed)
Called patient and made her aware that her ADAP was denied due to the state not receiving residential documentation. Patient mentioned that she will contact her case manager Shavon and get her to resend in her application.

## 2020-07-11 ENCOUNTER — Telehealth: Payer: Self-pay | Admitting: *Deleted

## 2020-07-11 NOTE — Telephone Encounter (Signed)
Called patient this am to get information pertaining to her ADAP status. Left Vm for patient to call office.  Patient ADAP was denied due to proof of residency not being received. I also sent Shavone her case manager an e-mail pertaining to this denial. Aldona Lento completed patient ADAP re certification. In the meantime I reached out to Butch Penny to try and get medication assistance for the patient.

## 2020-07-19 ENCOUNTER — Telehealth (INDEPENDENT_AMBULATORY_CARE_PROVIDER_SITE_OTHER): Payer: Self-pay | Admitting: Family Medicine

## 2020-07-19 ENCOUNTER — Other Ambulatory Visit: Payer: Self-pay

## 2020-07-19 ENCOUNTER — Encounter: Payer: Self-pay | Admitting: Family Medicine

## 2020-07-19 DIAGNOSIS — I10 Essential (primary) hypertension: Secondary | ICD-10-CM

## 2020-07-19 DIAGNOSIS — M62838 Other muscle spasm: Secondary | ICD-10-CM

## 2020-07-19 DIAGNOSIS — G40909 Epilepsy, unspecified, not intractable, without status epilepticus: Secondary | ICD-10-CM

## 2020-07-19 DIAGNOSIS — E785 Hyperlipidemia, unspecified: Secondary | ICD-10-CM

## 2020-07-19 DIAGNOSIS — F32A Depression, unspecified: Secondary | ICD-10-CM

## 2020-07-19 DIAGNOSIS — M2550 Pain in unspecified joint: Secondary | ICD-10-CM

## 2020-07-19 DIAGNOSIS — F419 Anxiety disorder, unspecified: Secondary | ICD-10-CM

## 2020-07-19 MED ORDER — IBUPROFEN 800 MG PO TABS: ORAL_TABLET | ORAL | 1 refills | Status: DC

## 2020-07-19 MED ORDER — HYDROCHLOROTHIAZIDE 25 MG PO TABS: ORAL_TABLET | ORAL | 3 refills | Status: DC

## 2020-07-19 MED ORDER — CYCLOBENZAPRINE HCL 5 MG PO TABS: 5.0000 mg | ORAL_TABLET | Freq: Every day | ORAL | 1 refills | Status: DC
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
## 2020-07-20 ENCOUNTER — Telehealth: Payer: Self-pay | Admitting: Family Medicine

## 2020-07-20 NOTE — Telephone Encounter (Signed)
Pt was called VM was left for Pt to call the office to schedule a 3 month follow up per L Hart [place]
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
## 2020-07-29 ENCOUNTER — Other Ambulatory Visit: Payer: Self-pay

## 2020-07-29 ENCOUNTER — Ambulatory Visit (INDEPENDENT_AMBULATORY_CARE_PROVIDER_SITE_OTHER): Payer: Self-pay | Admitting: Neurology

## 2020-07-29 ENCOUNTER — Encounter: Payer: Self-pay | Admitting: Neurology

## 2020-07-29 VITALS — BP 162/103 | HR 86 | Ht 69.0 in | Wt 230.2 lb

## 2020-07-29 DIAGNOSIS — R55 Syncope and collapse: Secondary | ICD-10-CM

## 2020-07-29 DIAGNOSIS — G40909 Epilepsy, unspecified, not intractable, without status epilepticus: Secondary | ICD-10-CM

## 2020-07-29 DIAGNOSIS — R200 Anesthesia of skin: Secondary | ICD-10-CM

## 2020-07-29 DIAGNOSIS — G894 Chronic pain syndrome: Secondary | ICD-10-CM

## 2020-07-29 NOTE — Patient Instructions (Signed)
1.  We will check a routine EEG.  If this is normal, we will order an ambulatory EEG 2.  Follow up after testing

## 2020-08-02 ENCOUNTER — Telehealth (INDEPENDENT_AMBULATORY_CARE_PROVIDER_SITE_OTHER): Payer: Self-pay | Admitting: Family Medicine

## 2020-08-02 ENCOUNTER — Other Ambulatory Visit: Payer: Self-pay

## 2020-08-02 ENCOUNTER — Encounter: Payer: Self-pay | Admitting: Family Medicine

## 2020-08-02 VITALS — BP 135/91 | HR 98 | Ht 69.0 in | Wt 230.0 lb

## 2020-08-02 DIAGNOSIS — M2550 Pain in unspecified joint: Secondary | ICD-10-CM

## 2020-08-02 DIAGNOSIS — I10 Essential (primary) hypertension: Secondary | ICD-10-CM

## 2020-08-02 DIAGNOSIS — E785 Hyperlipidemia, unspecified: Secondary | ICD-10-CM

## 2020-08-02 DIAGNOSIS — F17200 Nicotine dependence, unspecified, uncomplicated: Secondary | ICD-10-CM
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
## 2020-08-05 NOTE — Telephone Encounter (Signed)
Called and notified pt of this she is going follow up with Dr. Raeford Razor

## 2020-08-09 ENCOUNTER — Telehealth: Payer: Self-pay | Admitting: Family Medicine

## 2020-08-10 ENCOUNTER — Other Ambulatory Visit: Payer: Self-pay | Admitting: Family Medicine

## 2020-08-10 MED ORDER — FENOFIBRATE 145 MG PO TABS: 145.0000 mg | ORAL_TABLET | Freq: Every day | ORAL | 1 refills | Status: DC

## 2020-08-10 NOTE — Progress Notes (Signed)
Meds ordered this encounter  Medications  . fenofibrate (TRICOR) 145 MG tablet    Sig: Take 1 tablet (145 mg total) by mouth daily.    Dispense:  90 tablet    Refill:  1    Order Specific Question:   Supervising Provider    Answer:   Tresa Garter [id number]

## 2020-08-11 ENCOUNTER — Other Ambulatory Visit: Payer: Self-pay

## 2020-08-11 ENCOUNTER — Ambulatory Visit (INDEPENDENT_AMBULATORY_CARE_PROVIDER_SITE_OTHER): Payer: Self-pay | Admitting: Neurology

## 2020-08-11 DIAGNOSIS — G40909 Epilepsy, unspecified, not intractable, without status epilepticus: Secondary | ICD-10-CM

## 2020-08-11 NOTE — Telephone Encounter (Signed)
DOne

## 2020-08-12 ENCOUNTER — Telehealth: Payer: Self-pay

## 2020-08-12 DIAGNOSIS — G40909 Epilepsy, unspecified, not intractable, without status epilepticus: Secondary | ICD-10-CM

## 2020-08-12 NOTE — Telephone Encounter (Signed)
Telephone call to pt. No answer.   Cheryl Thompson when you get a chance can you schedule pt.

## 2020-08-12 NOTE — Procedures (Signed)
ELECTROENCEPHALOGRAM REPORT  Date of Study: 08/11/2020  Patient's Name: Cheryl Thompson MRN: [id number] Date of Birth: 04/26/1966  Referring Provider:  Cammie Sickle, FNP  Clinical History: 53 year oldl female with past history of seizures presents for recurrent falls - evaluate for epileptic drop attacks.  Medications: VENTOLIN HFA 108 (90 Base) MCG/ACT inhaler NORVASC 10 MG tablet SARNA lotion LOTRISONE cream FLEXERIL 5 MG tablet SYMTUZA 800-150-200-10 MG TABS BENTYL 10 MG capsule CYMBALTA 60 MG capsule VITAMIN D2 1.25 MG (50000 UT) capsule TRICOR 145 MG tablet HYDRODIURIL 25 MG tablet ATARAX/VISTARIL 25 MG tablet LEVSIN SL 0.125 MG SL tablet ADVIL 800 MG tablet KEPPRA 500 MG tablet XYZAL 5 MG tablet SYNTHROID 175 MCG tablet GLUCOPHAGE 500 MG tablet NICODERM CQ - DOSED IN MG/24 HOURS 21 mg/24hr patch FISH OIL 1000 MG CAPS RAYOS PO LYRICA 75 MG capsule ULTRAM 50 MG tablet KENALOG 0.5 %  Technical Summary: A multichannel digital EEG recording measured by the international 10-20 system with electrodes applied with paste and impedances below 5000 ohms performed in our laboratory with EKG monitoring in an awake and asleep patient.  Hyperventilation was not performed as patient is wearing a face mask due to the COVID-19 pandemic.  Photic stimulation was performed.  The digital EEG was referentially recorded, reformatted, and digitally filtered in a variety of bipolar and referential montages for optimal display.    Description: The patient is awake and asleep during the recording.  During maximal wakefulness, there is a symmetric, medium voltage 9 Hz posterior dominant rhythm that attenuates with eye opening.  The record is symmetric.  During drowsiness and sleep, there is an increase in theta slowing of the background.  Vertex waves and symmetric sleep spindles were seen.  Photic stimulation did not elicit any abnormalities.  There were no epileptiform discharges or  electrographic seizures seen.    EKG lead was unremarkable.  Impression: This awake and asleep EEG is normal.    Clinical Correlation: A normal EEG does not exclude a clinical diagnosis of epilepsy.  If further clinical questions remain, prolonged EEG may be helpful.  Clinical correlation is advised.   Metta Clines, DO

## 2020-08-12 NOTE — Telephone Encounter (Signed)
-----   Message from Pieter Partridge, DO sent at 08/12/2020  6:33 AM EST ----- Routine EEG normal.  I would like to proceed with 48 hour ambulatory EEG

## 2020-08-14 NOTE — Patient Instructions (Signed)
No rheumatology referral warranted at this time. Continue to follow up with sports medicine. No other medication changes on today. Follow up in clinic as scheduled.   Thank you so much!  Joint Pain  Joint pain can be caused by many things. It is likely to go away if you follow instructions from your doctor for taking care of yourself at home. Sometimes, you may need more treatment. Follow these instructions at home: Managing pain, stiffness, and swelling  If told, put ice on the painful area. To do this: ? If you have a removable elastic bandage, sling, or splint, take it off as told by your doctor. ? Put ice in a plastic bag. ? Place a towel between your skin and the bag. ? Leave the ice on for 20 minutes, 2-3 times a day. ? Take off the ice if your skin turns bright red. This is very important. If you cannot feel pain, heat, or cold, you have a greater risk of damage to the area.  Move your fingers or toes below the painful joint often.  Raise the painful joint above the level of your heart while you are sitting or lying down.  If told, put heat on the painful area. Do this as often as told by your doctor. Use the heat source that your doctor recommends, such as a moist heat pack or a heating pad. ? Place a towel between your skin and the heat source. ? Leave the heat on for 20-30 minutes. ? Take off the heat if your skin gets bright red. This is especially important if you are unable to feel pain, heat, or cold. You may have a greater risk of getting burned.      Activity  Rest the painful joint for as long as told by your doctor. Do not do things that cause pain or make your pain worse.  Begin exercising or stretching the affected area, as told by your doctor. Ask your doctor what types of exercise are safe for you.  Return to your normal activities when your doctor says that it is safe. If you have an elastic bandage, sling, or splint:  Wear it as told by your doctor. Take it  only as told by your doctor.  Loosen it your fingers or toes below the joint: ? Tingle. ? Become numb. ? Get cold and blue.  Keep it clean.  Ask your doctor if you should take it off before bathing.  If it is not waterproof: ? Do not let it get wet. ? Cover it with a watertight covering when you take a bath or shower. General instructions  Take over-the-counter and prescription medicines only as told by your doctor. This may include medicines taken by mouth or applied to the skin.  Do not smoke or use any products that contain nicotine or tobacco. If you need help quitting, ask your doctor.  Keep all follow-up visits as told by your doctor. This is important. Contact a doctor if:  You have pain that gets worse and does not get better with medicine.  Your joint pain does not get better in 3 days.  You have more bruising or swelling.  You have a fever.  You lose 10 lb (4.5 kg) or more without trying. Get help right away if:  You cannot move the joint.  Your fingers or toes tingle, become numb. or get cold and blue.  You have a fever along with a joint that is red, warm, and swollen.  Summary  Joint pain can be caused by many things. It often goes away if you follow instructions from your doctor for taking care of yourself at home.  Rest the painful joint for as long as told. Do not do things that cause pain or make your pain worse.  Take over-the-counter and prescription medicines only as told by your doctor. This information is not intended to replace advice given to you by your health care provider. Make sure you discuss any questions you have with your health care provider. Document Revised: 10/14/2019 Document Reviewed: 10/14/2019 Elsevier Patient Education  [place]. Tobacco Use Disorder Tobacco use disorder (TUD) occurs when a person craves, seeks, and uses tobacco, regardless of the consequences. This disorder can cause problems with mental and physical  health. It can affect your ability to have healthy relationships, and it can keep you from meeting your responsibilities at work, home, or school. Tobacco may be:  Smoked as a cigarette or cigar.  Inhaled using e-cigarettes.  Smoked in a pipe or hookah.  Chewed as smokeless tobacco.  Inhaled into the nostrils as snuff. Tobacco products contain a dangerous chemical called nicotine, which is very addictive. Nicotine triggers hormones that make the body feel stimulated and works on areas of the brain that make you feel good. These effects can make it hard for people to quit nicotine. Tobacco contains many other unsafe chemicals that can damage almost every organ in the body. Smoking tobacco also puts others in danger due to fire risk and possible health problems caused by breathing in secondhand smoke. What are the signs or symptoms? Symptoms of TUD may include:  Being unable to slow down or stop your tobacco use.  Spending an abnormal amount of time getting or using tobacco.  Craving tobacco. Cravings may last for up to 6 months after quitting.  Tobacco use that: ? Interferes with your work, school, or home life. ? Interferes with your personal and social relationships. ? Makes you give up activities that you once enjoyed or found important.  Using tobacco even though you know that it is: ? Dangerous or bad for your health or someone else's health. ? Causing problems in your life.  Needing more and more of the substance to get the same effect (developing tolerance).  Experiencing unpleasant symptoms if you do not use the substance (withdrawal). Withdrawal symptoms may include: ? Depressed, anxious, or irritable mood. ? Difficulty concentrating. ? Increased appetite. ? Restlessness or trouble sleeping.  Using the substance to avoid withdrawal. How is this diagnosed? This condition may be diagnosed based on:  Your current and past tobacco use. Your health care provider may ask  questions about how your tobacco use affects your life.  A physical exam. You may be diagnosed with TUD if you have at least two symptoms within a 82-month period. How is this treated? This condition is treated by stopping tobacco use. Many people are unable to quit on their own and need help. Treatment may include:  Nicotine replacement therapy (NRT). NRT provides nicotine without the other harmful chemicals in tobacco. NRT gradually lowers the dosage of nicotine in the body and reduces withdrawal symptoms. NRT is available as: ? Over-the-counter gums, lozenges, and skin patches. ? Prescription mouth inhalers and nasal sprays.  Medicine that acts on the brain to reduce cravings and withdrawal symptoms.  A type of talk therapy that examines your triggers for tobacco use, how to avoid them, and how to cope with cravings (behavioral therapy).  Hypnosis. This may help with withdrawal symptoms.  Joining a support group for others coping with TUD. The best treatment for TUD is usually a combination of medicine, talk therapy, and support groups. Recovery can be a long process. Many people start using tobacco again after stopping (relapse). If you relapse, it does not mean that treatment will not work. Follow these instructions at home: Lifestyle  Do not use any products that contain nicotine or tobacco, such as cigarettes and e-cigarettes.  Avoid things that trigger tobacco use as much as you can. Triggers include people and situations that usually cause you to use tobacco.  Avoid drinks that contain caffeine, including coffee. These may worsen some withdrawal symptoms.  Find ways to manage stress. Wanting to smoke may cause stress, and stress can make you want to smoke. Relaxation techniques such as deep breathing, meditation, and yoga may help.  Attend support groups as needed. These groups are an important part of long-term recovery for many people. General instructions  Take  over-the-counter and prescription medicines only as told by your health care provider.  Check with your health care provider before taking any new prescription or over-the-counter medicines.  Decide on a friend, family member, or smoking quit-line (such as 1-800-QUIT-NOW in the [place] that you can call or text when you feel the urge to smoke or when you need help coping with cravings.  Keep all follow-up visits as told by your health care provider and therapist. This is important.   Contact a health care provider if:  You are not able to take your medicines as prescribed.  Your symptoms get worse, even with treatment. Summary  Tobacco use disorder (TUD) occurs when a person craves, seeks, and uses tobacco regardless of the consequences.  This condition may be diagnosed based on your current and past tobacco use and a physical exam.  Many people are unable to quit on their own and need help. Recovery can be a long process.  The most effective treatment for TUD is usually a combination of medicine, talk therapy, and support groups. This information is not intended to replace advice given to you by your health care provider. Make sure you discuss any questions you have with your health care provider. Document Revised: 06/19/2017 Document Reviewed: 06/19/2017 Elsevier Patient Education  2021 [employer].

## 2020-08-22 ENCOUNTER — Other Ambulatory Visit: Payer: Self-pay

## 2020-08-22 ENCOUNTER — Ambulatory Visit: Payer: Medicaid Other | Admitting: Neurology

## 2020-08-22 DIAGNOSIS — G40909 Epilepsy, unspecified, not intractable, without status epilepticus: Secondary | ICD-10-CM | POA: Diagnosis not present

## 2020-08-26 NOTE — Progress Notes (Signed)
Pt advised of EEG results.

## 2020-08-26 NOTE — Procedures (Signed)
ELECTROENCEPHALOGRAM REPORT  Dates of Recording: 08/22/2020 at 08:04 to 08/24/2020 at 07:17  Patient's Name: Cheryl Thompson MRN: [id number] Date of Birth: 03-24-1966  Referring Provider: Asencion Partridge. Smith Robert, FNP  Procedure: 48-hour ambulatory EEG  History: 55 year old female with chronic pain syndrome and left sided numbness with remote history of seizures presents for recurrent drop attacks.  Medications:  VENTOLIN HFA 108 (90 Base) MCG/ACT inhaler NORVASC 10 MG tablet SARNA lotion LOTRISONE cream FLEXERIL 5 MG tablet SYMTUZA 800-150-200-10 MG TABS BENTYL 10 MG capsule CYMBALTA 60 MG capsule VITAMIN D2 1.25 MG (50000 UT) capsule TRICOR 145 MG tablet HYDRODIURIL 25 MG tablet ATARAX/VISTARIL 25 MG tablet LEVSIN SL 0.125 MG SL tablet ADVIL 800 MG tablet KEPPRA 500 MG tablet XYZAL 5 MG tablet SYNTHROID 175 MCG tablet GLUCOPHAGE 500 MG tablet NICODERM CQ - DOSED IN MG/24 HOURS 21 mg/24hr patch FISH OIL 1000 MG CAPS RAYOS PO LYRICA 75 MG capsule ULTRAM 50 MG tablet  Technical Summary: This is a 48-hour multichannel digital EEG recording measured by the international 10-20 system with electrodes applied with paste and impedances below 5000 ohms performed as portable with EKG monitoring.  The digital EEG was referentially recorded, reformatted, and digitally filtered in a variety of bipolar and referential montages for optimal display.    DESCRIPTION OF RECORDING: During maximal wakefulness, the background activity consisted of a symmetric 9Hz  posterior dominant rhythm which was reactive to eye opening.  There were no epileptiform discharges or focal slowing seen in wakefulness.  During the recording, the patient progresses through wakefulness, drowsiness, and Stage 2 sleep.  Again, there were no epileptiform discharges seen.  Events: 08/23/2020 at 4:25 AM - patient reported left arm numbness 08/23/2020 at 7:36-7:40 AM - patient reported that she almost fell down in the  kitchen.  There were no electrographic seizures seen.  EKG lead was unremarkable.  IMPRESSION: This 48-hour ambulatory EEG study is normal.    CLINICAL CORRELATION: A normal EEG does not exclude a clinical diagnosis of epilepsy.  If further clinical questions remain, inpatient video EEG monitoring may be helpful.   Metta Clines, DO

## 2020-08-26 NOTE — Progress Notes (Signed)
2nd attempt to contact pt.

## 2020-08-26 NOTE — Progress Notes (Signed)
Tried caling pt, No answer. LMOVM

## 2020-08-30 ENCOUNTER — Ambulatory Visit (INDEPENDENT_AMBULATORY_CARE_PROVIDER_SITE_OTHER): Payer: Medicaid Other | Admitting: Family Medicine

## 2020-08-30 ENCOUNTER — Ambulatory Visit: Payer: Self-pay

## 2020-08-30 ENCOUNTER — Other Ambulatory Visit: Payer: Self-pay

## 2020-08-30 VITALS — BP 103/66 | Ht 69.0 in | Wt 230.0 lb

## 2020-08-30 DIAGNOSIS — M25562 Pain in left knee: Secondary | ICD-10-CM

## 2020-08-30 DIAGNOSIS — S82142A Displaced bicondylar fracture of left tibia, initial encounter for closed fracture: Secondary | ICD-10-CM | POA: Diagnosis not present

## 2020-08-30 DIAGNOSIS — M170 Bilateral primary osteoarthritis of knee: Secondary | ICD-10-CM
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
## 2020-08-30 NOTE — Assessment & Plan Note (Signed)
Symptoms are acute on chronic in nature.  Has tried injections in the past.  Recently had a tibial plateau fracture of the left knee.  Right knee has ongoing chronic issues. -Counseled on home exercise therapy and supportive care. -Referral to physical therapy. -Referral to orthopedics for consideration of knee replacement.

## 2020-08-30 NOTE — Patient Instructions (Signed)
Good to see you Please use ice as needed  Please try physical therapy  I referred you to [facility] with Dr. Maureen Ralphs  Please send me a message in [place] with any questions or updates.  Please see me back in 4 weeks or as needed.   --Dr. Raeford Razor

## 2020-08-30 NOTE — Assessment & Plan Note (Signed)
Initial fracture was 11/6.  Has changes on ultrasound to suggest sensitivity of that lateral tibial plateau. -Counseled on home exercise therapy and supportive care. -Further physical therapy. -Could consider injection if needed.

## 2020-09-01 ENCOUNTER — Other Ambulatory Visit: Payer: Self-pay

## 2020-09-01 ENCOUNTER — Ambulatory Visit: Payer: Medicaid Other | Attending: Family Medicine

## 2020-09-01 DIAGNOSIS — G8929 Other chronic pain: Secondary | ICD-10-CM

## 2020-09-01 DIAGNOSIS — R2681 Unsteadiness on feet: Secondary | ICD-10-CM | POA: Insufficient documentation

## 2020-09-01 DIAGNOSIS — R296 Repeated falls: Secondary | ICD-10-CM | POA: Diagnosis present

## 2020-09-01 DIAGNOSIS — M25561 Pain in right knee: Secondary | ICD-10-CM | POA: Insufficient documentation

## 2020-09-01 DIAGNOSIS — M25661 Stiffness of right knee, not elsewhere classified: Secondary | ICD-10-CM | POA: Diagnosis present

## 2020-09-01 DIAGNOSIS — M25662 Stiffness of left knee, not elsewhere classified: Secondary | ICD-10-CM

## 2020-09-01 DIAGNOSIS — M25562 Pain in left knee: Secondary | ICD-10-CM | POA: Diagnosis not present

## 2020-09-01 DIAGNOSIS — M6281 Muscle weakness (generalized): Secondary | ICD-10-CM

## 2020-09-01 DIAGNOSIS — M545 Low back pain, unspecified: Secondary | ICD-10-CM | POA: Insufficient documentation
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
## 2020-09-02 ENCOUNTER — Other Ambulatory Visit: Payer: Self-pay | Admitting: Nurse Practitioner

## 2020-09-02 DIAGNOSIS — I10 Essential (primary) hypertension: Secondary | ICD-10-CM

## 2020-09-05 ENCOUNTER — Encounter: Payer: Self-pay | Admitting: Family Medicine

## 2020-09-07 ENCOUNTER — Other Ambulatory Visit: Payer: Self-pay

## 2020-09-07 ENCOUNTER — Ambulatory Visit: Payer: Medicaid Other

## 2020-09-07 ENCOUNTER — Ambulatory Visit (HOSPITAL_BASED_OUTPATIENT_CLINIC_OR_DEPARTMENT_OTHER)
Admission: RE | Admit: 2020-09-07 | Discharge: 2020-09-07 | Disposition: A | Discharge status: Home / Self Care | Payer: Medicaid Other | Source: Ambulatory Visit | Attending: Family Medicine | Admitting: Family Medicine

## 2020-09-07 ENCOUNTER — Ambulatory Visit (INDEPENDENT_AMBULATORY_CARE_PROVIDER_SITE_OTHER): Payer: Medicaid Other | Admitting: Family Medicine

## 2020-09-07 ENCOUNTER — Ambulatory Visit: Payer: Self-pay

## 2020-09-07 VITALS — BP 122/80 | Ht 69.0 in | Wt 230.0 lb

## 2020-09-07 DIAGNOSIS — M25562 Pain in left knee: Secondary | ICD-10-CM | POA: Diagnosis not present

## 2020-09-07 DIAGNOSIS — R2681 Unsteadiness on feet: Secondary | ICD-10-CM

## 2020-09-07 DIAGNOSIS — S82839A Other fracture of upper and lower end of unspecified fibula, initial encounter for closed fracture: Secondary | ICD-10-CM | POA: Diagnosis present

## 2020-09-07 DIAGNOSIS — G40909 Epilepsy, unspecified, not intractable, without status epilepticus: Secondary | ICD-10-CM

## 2020-09-07 DIAGNOSIS — R296 Repeated falls: Secondary | ICD-10-CM

## 2020-09-07 DIAGNOSIS — M6281 Muscle weakness (generalized): Secondary | ICD-10-CM

## 2020-09-07 DIAGNOSIS — M25571 Pain in right ankle and joints of right foot: Secondary | ICD-10-CM

## 2020-09-07 DIAGNOSIS — M25662 Stiffness of left knee, not elsewhere classified: Secondary | ICD-10-CM

## 2020-09-07 MED ORDER — LEVETIRACETAM 500 MG PO TABS: ORAL_TABLET | ORAL | 2 refills | Status: DC
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
## 2020-09-07 NOTE — Patient Instructions (Signed)
Good to see you Please try ice  Please perform range of motion  I will call with the results from today   Please send me a message in MyChart with any questions or updates.  Please see me back in 2 weeks.   --Dr. Raeford Razor
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
## 2020-09-09 ENCOUNTER — Encounter: Payer: Self-pay | Admitting: Physical Therapy

## 2020-09-09 ENCOUNTER — Other Ambulatory Visit: Payer: Self-pay

## 2020-09-09 ENCOUNTER — Ambulatory Visit: Payer: Medicaid Other | Admitting: Physical Therapy

## 2020-09-09 DIAGNOSIS — M25562 Pain in left knee: Secondary | ICD-10-CM | POA: Diagnosis not present

## 2020-09-09 DIAGNOSIS — M25661 Stiffness of right knee, not elsewhere classified: Secondary | ICD-10-CM

## 2020-09-09 DIAGNOSIS — R2681 Unsteadiness on feet: Secondary | ICD-10-CM

## 2020-09-09 DIAGNOSIS — M25662 Stiffness of left knee, not elsewhere classified: Secondary | ICD-10-CM

## 2020-09-09 DIAGNOSIS — M545 Low back pain, unspecified: Secondary | ICD-10-CM

## 2020-09-09 DIAGNOSIS — R296 Repeated falls: Secondary | ICD-10-CM

## 2020-09-09 DIAGNOSIS — M6281 Muscle weakness (generalized): Secondary | ICD-10-CM

## 2020-09-09 DIAGNOSIS — G8929 Other chronic pain: Secondary | ICD-10-CM

## 2020-09-09 DIAGNOSIS — M25561 Pain in right knee: Secondary | ICD-10-CM
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
## 2020-09-14 ENCOUNTER — Ambulatory Visit: Payer: Medicaid Other | Attending: Family Medicine

## 2020-09-14 ENCOUNTER — Other Ambulatory Visit: Payer: Self-pay

## 2020-09-14 DIAGNOSIS — M25662 Stiffness of left knee, not elsewhere classified: Secondary | ICD-10-CM | POA: Diagnosis present

## 2020-09-14 DIAGNOSIS — R296 Repeated falls: Secondary | ICD-10-CM | POA: Diagnosis present

## 2020-09-14 DIAGNOSIS — M25661 Stiffness of right knee, not elsewhere classified: Secondary | ICD-10-CM

## 2020-09-14 DIAGNOSIS — R2681 Unsteadiness on feet: Secondary | ICD-10-CM | POA: Diagnosis present

## 2020-09-14 DIAGNOSIS — M25561 Pain in right knee: Secondary | ICD-10-CM | POA: Insufficient documentation

## 2020-09-14 DIAGNOSIS — M25562 Pain in left knee: Secondary | ICD-10-CM | POA: Diagnosis not present

## 2020-09-14 DIAGNOSIS — M6281 Muscle weakness (generalized): Secondary | ICD-10-CM | POA: Diagnosis present
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
## 2020-09-15 ENCOUNTER — Encounter: Payer: Self-pay | Admitting: Family Medicine

## 2020-09-16 ENCOUNTER — Other Ambulatory Visit: Payer: Self-pay

## 2020-09-16 ENCOUNTER — Ambulatory Visit: Payer: Medicaid Other

## 2020-09-16 DIAGNOSIS — M25662 Stiffness of left knee, not elsewhere classified: Secondary | ICD-10-CM

## 2020-09-16 DIAGNOSIS — M25562 Pain in left knee: Secondary | ICD-10-CM

## 2020-09-16 DIAGNOSIS — M6281 Muscle weakness (generalized): Secondary | ICD-10-CM

## 2020-09-16 DIAGNOSIS — R296 Repeated falls: Secondary | ICD-10-CM
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
## 2020-09-20 ENCOUNTER — Ambulatory Visit: Payer: Medicaid Other | Admitting: Physical Therapy

## 2020-09-21 ENCOUNTER — Other Ambulatory Visit: Payer: Self-pay

## 2020-09-21 ENCOUNTER — Ambulatory Visit: Payer: Medicaid Other | Admitting: Family Medicine

## 2020-09-21 ENCOUNTER — Encounter: Payer: Self-pay | Admitting: Family Medicine

## 2020-09-21 DIAGNOSIS — S82839A Other fracture of upper and lower end of unspecified fibula, initial encounter for closed fracture: Secondary | ICD-10-CM | POA: Diagnosis not present
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
## 2020-09-21 NOTE — Assessment & Plan Note (Signed)
Initial injury on 2/22.  Having improvement with the cam walker. -Counseled on home exercise therapy and supportive care. -Transition to ASO and discontinue CAM Walker. -Could consider physical therapy.

## 2020-09-21 NOTE — Patient Instructions (Signed)
Good to see you Please use ice as needed  Please try the exercises  Please try the lace up brace  Please send me a message in MyChart with any questions or updates.  Please see me back in 4 weeks or as needed if better.   --Dr. Raeford Razor

## 2020-09-27 ENCOUNTER — Ambulatory Visit: Payer: Medicaid Other | Admitting: Family Medicine
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
## 2020-09-30 ENCOUNTER — Encounter: Payer: Medicaid Other | Admitting: Physical Therapy

## 2020-10-04 ENCOUNTER — Encounter: Payer: Medicaid Other | Admitting: Physical Therapy

## 2020-10-04 ENCOUNTER — Other Ambulatory Visit: Payer: Self-pay | Admitting: Family Medicine

## 2020-10-04 DIAGNOSIS — I10 Essential (primary) hypertension: Secondary | ICD-10-CM

## 2020-10-07 ENCOUNTER — Encounter: Payer: Medicaid Other | Admitting: Physical Therapy

## 2020-10-11 ENCOUNTER — Telehealth: Payer: Self-pay

## 2020-10-11 DIAGNOSIS — R2689 Other abnormalities of gait and mobility: Secondary | ICD-10-CM

## 2020-10-11 NOTE — Telephone Encounter (Signed)
Pt called and informed that with the  Longstanding history of alcohol use would possibly contribute to balance problems. However, Dr Tomi Likens would have expected to see some changes in the brain which were not seen on previous brain MRI a few years ago. As she continues to have frequent falls, he  would like to check MRI of brain with and without contrast. Pt thankful and verbalized understanding,

## 2020-10-14 ENCOUNTER — Encounter: Payer: Medicaid Other | Admitting: Physical Therapy

## 2020-10-19 ENCOUNTER — Encounter: Payer: Self-pay | Admitting: Family Medicine

## 2020-10-19 ENCOUNTER — Ambulatory Visit (INDEPENDENT_AMBULATORY_CARE_PROVIDER_SITE_OTHER): Payer: Medicaid Other | Admitting: Family Medicine

## 2020-10-19 ENCOUNTER — Other Ambulatory Visit: Payer: Self-pay

## 2020-10-19 VITALS — BP 133/82 | HR 101 | Ht 69.0 in | Wt 228.0 lb

## 2020-10-19 DIAGNOSIS — L918 Other hypertrophic disorders of the skin: Secondary | ICD-10-CM | POA: Diagnosis not present

## 2020-10-19 DIAGNOSIS — Z09 Encounter for follow-up examination after completed treatment for conditions other than malignant neoplasm: Secondary | ICD-10-CM

## 2020-10-19 DIAGNOSIS — I10 Essential (primary) hypertension: Secondary | ICD-10-CM
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
## 2020-10-21 ENCOUNTER — Encounter: Payer: Medicaid Other | Admitting: Physical Therapy

## 2020-10-24 ENCOUNTER — Ambulatory Visit: Payer: Medicaid Other | Admitting: Internal Medicine

## 2020-10-25 ENCOUNTER — Encounter: Payer: Self-pay | Admitting: Family Medicine

## 2020-10-25 ENCOUNTER — Other Ambulatory Visit: Payer: Self-pay

## 2020-10-25 ENCOUNTER — Ambulatory Visit (INDEPENDENT_AMBULATORY_CARE_PROVIDER_SITE_OTHER): Payer: Medicaid Other | Admitting: Internal Medicine

## 2020-10-25 ENCOUNTER — Encounter: Payer: Self-pay | Admitting: Internal Medicine

## 2020-10-25 ENCOUNTER — Other Ambulatory Visit (HOSPITAL_COMMUNITY)
Admission: RE | Admit: 2020-10-25 | Discharge: 2020-10-25 | Disposition: A | Discharge status: Home / Self Care | Payer: Medicaid Other | Source: Ambulatory Visit | Attending: Internal Medicine | Admitting: Internal Medicine

## 2020-10-25 VITALS — BP 135/85 | HR 89 | Temp 97.5°F | Ht 69.0 in | Wt 224.0 lb

## 2020-10-25 DIAGNOSIS — K7460 Unspecified cirrhosis of liver: Secondary | ICD-10-CM

## 2020-10-25 DIAGNOSIS — Z21 Asymptomatic human immunodeficiency virus [HIV] infection status: Secondary | ICD-10-CM

## 2020-10-25 DIAGNOSIS — Z113 Encounter for screening for infections with a predominantly sexual mode of transmission: Secondary | ICD-10-CM

## 2020-10-25 NOTE — Assessment & Plan Note (Signed)
She continues to do well and no new concerns.  Continue with Symptuza and rtc in 6 months

## 2020-10-25 NOTE — Assessment & Plan Note (Signed)
Will screen 

## 2020-10-25 NOTE — Assessment & Plan Note (Signed)
Will check her ultrasound next visit for [facility] screening.

## 2020-10-25 NOTE — Progress Notes (Signed)
   Subjective:    Patient ID: Cheryl Thompson, female    DOB: 1965-08-04, 55 y.o.   MRN: [id number]  HPI She is here for follow up of HIV She cotninues on Symtuza with no new issues.  No labs prior to the visit.  She recently broke her leg and is scheduled for a knee replacement in May.  No issues with getting, taking or tolerating the medication.  She remains alcohol free and will be 6 years sober on 4/18.     Review of Systems  Constitutional: Negative for fatigue.  Gastrointestinal: Negative for diarrhea and nausea.  Skin: Negative for rash.       Objective:   Physical Exam Eyes:     General: No scleral icterus. Cardiovascular:     Rate and Rhythm: Normal rate and regular rhythm.  Pulmonary:     Effort: Pulmonary effort is normal.  Skin:    Findings: No rash.  Neurological:     Mental Status: She is alert.  Psychiatric:        Mood and Affect: Mood normal.   SH: + tobacco        Assessment & Plan:

## 2020-10-26 LAB — URINE CYTOLOGY ANCILLARY ONLY
Chlamydia: NEGATIVE
Comment: NEGATIVE
Comment: NORMAL
Neisseria Gonorrhea: NEGATIVE

## 2020-10-26 LAB — T-HELPER CELL (CD4) - (RCID CLINIC ONLY)
CD4 % Helper T Cell: 46 % (ref 33–65)
CD4 T Cell Abs: 1607 /uL (ref 400–1790)

## 2020-10-27 ENCOUNTER — Encounter: Payer: Medicaid Other | Admitting: Physical Therapy

## 2020-10-28 LAB — HIV-1 RNA QUANT-NO REFLEX-BLD
HIV 1 RNA Quant: <20 Copies/mL — ABNORMAL HIGH
HIV-1 RNA Quant, Log: <1.3 Log cps/mL — ABNORMAL HIGH

## 2020-10-28 LAB — RPR: RPR Ser Ql: NONREACTIVE

## 2020-11-07 ENCOUNTER — Ambulatory Visit
Admission: RE | Admit: 2020-11-07 | Discharge: 2020-11-07 | Disposition: A | Discharge status: Home / Self Care | Payer: Medicaid Other | Source: Ambulatory Visit | Attending: Neurology | Admitting: Neurology

## 2020-11-07 ENCOUNTER — Other Ambulatory Visit: Payer: Self-pay

## 2020-11-07 DIAGNOSIS — R2689 Other abnormalities of gait and mobility: Secondary | ICD-10-CM

## 2020-11-07 MED ORDER — GADOBENATE DIMEGLUMINE 529 MG/ML IV SOLN
20.0000 mL | Freq: Once | INTRAVENOUS | Status: AC | PRN
  Administered 2020-11-07: 20 mL via INTRAVENOUS

## 2020-11-08 ENCOUNTER — Telehealth: Payer: Self-pay

## 2020-11-08 NOTE — Telephone Encounter (Signed)
Neurologic workup has been unremarkable.

## 2020-11-08 NOTE — Telephone Encounter (Signed)
MRI of brain is unremarkable. There are some mild chronic age-related changes but nothing concerning such as tumor, stroke or signs of chronic alcoholism.   Pt advised of her MRI results.   Pt upset that nothing letting her know or giving her  answering why she keeps falling. Per pt something has to be wrong. I can't be falling now reason at all  Per pt she had a fall yesterday. She is still going to the orthopedics, and having PT to help with strengthen.    Pt states she having the slurred speech and gait issues. What is going on.

## 2020-11-08 NOTE — Progress Notes (Signed)
Tried calling pt no answer. LMOVM to call the office back °

## 2020-11-10 NOTE — Progress Notes (Signed)
DUE TO COVID-19 ONLY ONE VISITOR IS ALLOWED TO COME WITH YOU AND STAY IN THE WAITING ROOM ONLY DURING PRE OP AND PROCEDURE DAY OF SURGERY. THE 1 VISITOR  MAY VISIT WITH YOU AFTER SURGERY IN YOUR PRIVATE ROOM DURING VISITING HOURS ONLY!  YOU NEED TO HAVE A COVID 19 TEST ON__5/5/22_____ @_______ , THIS TEST MUST BE DONE BEFORE SURGERY,  COVID TESTING SITE [place] [address], IT IS ON THE RIGHT GOING OUT [address] APPROXIMATELY  2 MINUTES PAST [employer] ON THE RIGHT. ONCE YOUR COVID TEST IS COMPLETED,  PLEASE BEGIN THE QUARANTINE INSTRUCTIONS AS OUTLINED IN YOUR HANDOUT.                Cheryl Thompson  11/10/2020   Your procedure is scheduled on:  11/21/20  Report to [facility] Main  Entrance   Report to admitting at       0900 AM     Call this number if you have problems the morning of surgery [phone]    REMEMBER: NO  SOLID FOOD CANDY OR GUM AFTER MIDNIGHT. CLEAR LIQUIDS UNTIL   0830am       . NOTHING BY MOUTH EXCEPT CLEAR LIQUIDS UNTIL    08630am    . PLEASE FINISH ENSURE DRINK PER SURGEON ORDER  WHICH NEEDS TO BE COMPLETED AT    0830am   .      CLEAR LIQUID DIET   Foods Allowed                                                                    Coffee and tea, regular and decaf                            Fruit ices (not with fruit pulp)                                      Iced Popsicles                                    Carbonated beverages, regular and diet                                    Cranberry, grape and apple juices Sports drinks like Gatorade Lightly seasoned clear broth or consume(fat free) Sugar, honey syrup ___________________________________________________________________      BRUSH YOUR TEETH MORNING OF SURGERY AND RINSE YOUR MOUTH OUT, NO CHEWING GUM CANDY OR MINTS.     Take these medicines the morning of surgery with A SIP OF WATER:     Inhalers as usual and breing, cymbalta, keppra, synthroid , lyrica   DO NOT TAKE ANY DIABETIC MEDICATIONS DAY OF YOUR SURGERY                               You may not have any metal on your body including hair pins and  piercings  Do not wear jewelry, make-up, lotions, powders or perfumes, deodorant             Do not wear nail polish on your fingernails.  Do not shave  48 hours prior to surgery.              Men may shave face and neck.   Do not bring valuables to the hospital. [place].  Contacts, dentures or bridgework may not be worn into surgery.  Leave suitcase in the car. After surgery it may be brought to your room.     Patients discharged the day of surgery will not be allowed to drive home. IF YOU ARE HAVING SURGERY AND GOING HOME THE SAME DAY, YOU MUST HAVE AN ADULT TO DRIVE YOU HOME AND BE WITH YOU FOR 24 HOURS. YOU MAY GO HOME BY TAXI OR UBER OR ORTHERWISE, BUT AN ADULT MUST ACCOMPANY YOU HOME AND STAY WITH YOU FOR 24 HOURS.  Name and phone number of your driver:  Special Instructions: N/A              Please read over the following fact sheets you were given: _____________________________________________________________________  [facility] - Preparing for Surgery Before surgery, you can play an important role.  Because skin is not sterile, your skin needs to be as free of germs as possible.  You can reduce the number of germs on your skin by washing with CHG (chlorahexidine gluconate) soap before surgery.  CHG is an antiseptic cleaner which kills germs and bonds with the skin to continue killing germs even after washing. Please DO NOT use if you have an allergy to CHG or antibacterial soaps.  If your skin becomes reddened/irritated stop using the CHG and inform your nurse when you arrive at Short Stay. Do not shave (including legs and underarms) for at least 48 hours prior to the first CHG shower.  You may shave your face/neck. Please follow these instructions carefully:  1.   Shower with CHG Soap the night before surgery and the  morning of Surgery.  2.  If you choose to wash your hair, wash your hair first as usual with your  normal  shampoo.  3.  After you shampoo, rinse your hair and body thoroughly to remove the  shampoo.                           4.  Use CHG as you would any other liquid soap.  You can apply chg directly  to the skin and wash                       Gently with a scrungie or clean washcloth.  5.  Apply the CHG Soap to your body ONLY FROM THE NECK DOWN.   Do not use on face/ open                           Wound or open sores. Avoid contact with eyes, ears mouth and genitals (private parts).                       Wash face,  Genitals (private parts) with your normal soap.             6.  Wash thoroughly, paying special attention to the area where your surgery  will be performed.  7.  Thoroughly rinse your body with warm water from the neck down.  8.  DO NOT shower/wash with your normal soap after using and rinsing off  the CHG Soap.                9.  Pat yourself dry with a clean towel.            10.  Wear clean pajamas.            11.  Place clean sheets on your bed the night of your first shower and do not  sleep with pets. Day of Surgery : Do not apply any lotions/deodorants the morning of surgery.  Please wear clean clothes to the hospital/surgery center.  FAILURE TO FOLLOW THESE INSTRUCTIONS MAY RESULT IN THE CANCELLATION OF YOUR SURGERY PATIENT SIGNATURE_________________________________  NURSE SIGNATURE__________________________________  ________________________________________________________________________

## 2020-11-11 ENCOUNTER — Other Ambulatory Visit: Payer: Self-pay

## 2020-11-11 ENCOUNTER — Encounter (HOSPITAL_COMMUNITY)
Admission: RE | Admit: 2020-11-11 | Discharge: 2020-11-11 | Disposition: A | Discharge status: Home / Self Care | Payer: Medicaid Other | Source: Ambulatory Visit | Attending: Orthopedic Surgery | Admitting: Orthopedic Surgery

## 2020-11-11 ENCOUNTER — Encounter (HOSPITAL_COMMUNITY): Payer: Self-pay

## 2020-11-11 DIAGNOSIS — Z01812 Encounter for preprocedural laboratory examination: Secondary | ICD-10-CM | POA: Insufficient documentation

## 2020-11-11 LAB — CBC
HCT: 38.7 % (ref 36.0–46.0)
Hemoglobin: 12.4 g/dL (ref 12.0–15.0)
MCH: 29.9 pg (ref 26.0–34.0)
MCHC: 32 g/dL (ref 30.0–36.0)
MCV: 93.3 fL (ref 80.0–100.0)
Platelets: 140 10*3/uL — ABNORMAL LOW (ref 150–400)
RBC: 4.15 MIL/uL (ref 3.87–5.11)
RDW: 13.6 % (ref 11.5–15.5)
WBC: 9 10*3/uL (ref 4.0–10.5)
nRBC: 0 % (ref 0.0–0.2)

## 2020-11-11 LAB — COMPREHENSIVE METABOLIC PANEL
ALT: 40 U/L (ref 0–44)
AST: 48 U/L — ABNORMAL HIGH (ref 15–41)
Albumin: 4 g/dL (ref 3.5–5.0)
Alkaline Phosphatase: 73 U/L (ref 38–126)
Anion gap: 10 (ref 5–15)
BUN: 14 mg/dL (ref 6–20)
CO2: 29 mmol/L (ref 22–32)
Calcium: 9.5 mg/dL (ref 8.9–10.3)
Chloride: 100 mmol/L (ref 98–111)
Creatinine, Ser: 0.74 mg/dL (ref 0.44–1.00)
GFR, Estimated: >60 mL/min (ref >60)
Glucose, Bld: 100 mg/dL — ABNORMAL HIGH (ref 70–99)
Potassium: 3.4 mmol/L — ABNORMAL LOW (ref 3.5–5.1)
Sodium: 139 mmol/L (ref 135–145)
Total Bilirubin: 0.1 mg/dL — ABNORMAL LOW (ref 0.3–1.2)
Total Protein: 8.3 g/dL — ABNORMAL HIGH (ref 6.5–8.1)

## 2020-11-11 LAB — APTT: aPTT: 30 seconds (ref 24–36)

## 2020-11-11 LAB — HEMOGLOBIN A1C
Hgb A1c MFr Bld: 6.4 % — ABNORMAL HIGH (ref 4.8–5.6)
Mean Plasma Glucose: 136.98 mg/dL

## 2020-11-11 LAB — PROTIME-INR
INR: 1 (ref 0.8–1.2)
Prothrombin Time: 12.8 seconds (ref 11.4–15.2)

## 2020-11-11 LAB — SURGICAL PCR SCREEN
MRSA, PCR: NEGATIVE
Staphylococcus aureus: POSITIVE — AB

## 2020-11-11 LAB — GLUCOSE, CAPILLARY: Glucose-Capillary: 110 mg/dL — ABNORMAL HIGH (ref 70–99)

## 2020-11-11 NOTE — Progress Notes (Addendum)
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  Cardiologist : none  Neuro- DR Tomi Likens Clearance 09/23/20 on chart  Chest x-ray : EKG : 04/30/2020- [place]- have reuqested 12 lead ekg tracing  04/30/20- EKG on chart  Echo : Stress test: Cardiac Cath :  Activity level:  Cannot do a fight of stairs without difficulty  Sleep Study/ CPAP : none  Fasting Blood Sugar :      / Checks Blood Sugar -- times a day:   Blood Thinner/ Instructions /Last Dose: ASA / Instructions/ Last Dose :  Hx of stroke- slight weakness on left seide per pt  Hx of seizure- last one 6 years ago  HIV  DM- borderline per pt does not check glucose at home  AT [facility] for Chest pain and elevated blood pressure- 10/16/2`1- in epic  hgba1c-11/11/20- 6.4  CBC done 11/10/20 routed to DR Aluisio.

## 2020-11-15 ENCOUNTER — Other Ambulatory Visit: Payer: Self-pay | Admitting: Family Medicine

## 2020-11-15 ENCOUNTER — Telehealth: Payer: Self-pay

## 2020-11-15 DIAGNOSIS — I10 Essential (primary) hypertension: Secondary | ICD-10-CM

## 2020-11-15 DIAGNOSIS — E039 Hypothyroidism, unspecified: Secondary | ICD-10-CM

## 2020-11-15 NOTE — Progress Notes (Signed)
Orders Placed This Encounter  Procedures  . Thyroid Panel With TSH    Standing Status:   Future    Standing Expiration Date:   11/15/2021  . Comprehensive metabolic panel    Standing Status:   Future    Standing Expiration Date:   11/15/2021

## 2020-11-15 NOTE — Telephone Encounter (Signed)
Spoke with patient informed her that she will need come in to have lab do first , before we send refills  to pharmacy. Pt has check in over 6 months

## 2020-11-15 NOTE — Telephone Encounter (Signed)
Called and lvm for patient call us back. 

## 2020-11-15 NOTE — Telephone Encounter (Signed)
Pt states need her thyroid medicine to be refilled

## 2020-11-16 ENCOUNTER — Other Ambulatory Visit: Payer: Self-pay

## 2020-11-16 ENCOUNTER — Other Ambulatory Visit: Payer: Medicaid Other

## 2020-11-16 DIAGNOSIS — M2550 Pain in unspecified joint: Secondary | ICD-10-CM

## 2020-11-16 DIAGNOSIS — I10 Essential (primary) hypertension: Secondary | ICD-10-CM

## 2020-11-16 DIAGNOSIS — E039 Hypothyroidism, unspecified: Secondary | ICD-10-CM
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
## 2020-11-17 ENCOUNTER — Other Ambulatory Visit: Payer: Self-pay | Admitting: Family Medicine

## 2020-11-17 ENCOUNTER — Other Ambulatory Visit (HOSPITAL_COMMUNITY)
Admission: RE | Admit: 2020-11-17 | Discharge: 2020-11-17 | Disposition: A | Discharge status: Home / Self Care | Payer: Medicaid Other | Source: Ambulatory Visit | Attending: Orthopedic Surgery | Admitting: Orthopedic Surgery

## 2020-11-17 DIAGNOSIS — Z20822 Contact with and (suspected) exposure to covid-19: Secondary | ICD-10-CM | POA: Insufficient documentation

## 2020-11-17 DIAGNOSIS — Z01812 Encounter for preprocedural laboratory examination: Secondary | ICD-10-CM | POA: Diagnosis not present

## 2020-11-17 DIAGNOSIS — I10 Essential (primary) hypertension: Secondary | ICD-10-CM

## 2020-11-17 LAB — COMPREHENSIVE METABOLIC PANEL
ALT: 17 IU/L (ref 0–32)
AST: 24 IU/L (ref 0–40)
Albumin/Globulin Ratio: 1.1 — ABNORMAL LOW (ref 1.2–2.2)
Albumin: 4.2 g/dL (ref 3.8–4.9)
Alkaline Phosphatase: 91 IU/L (ref 44–121)
BUN/Creatinine Ratio: 13 (ref 9–23)
BUN: 11 mg/dL (ref 6–24)
Bilirubin Total: 0.2 mg/dL (ref 0.0–1.2)
CO2: 24 mmol/L (ref 20–29)
Calcium: 9.3 mg/dL (ref 8.7–10.2)
Chloride: 97 mmol/L (ref 96–106)
Creatinine, Ser: 0.87 mg/dL (ref 0.57–1.00)
Globulin, Total: 3.8 g/dL (ref 1.5–4.5)
Glucose: 92 mg/dL (ref 65–99)
Potassium: 3.5 mmol/L (ref 3.5–5.2)
Sodium: 138 mmol/L (ref 134–144)
Total Protein: 8 g/dL (ref 6.0–8.5)
eGFR: 79 mL/min/{1.73_m2} (ref >59)

## 2020-11-17 LAB — THYROID PANEL WITH TSH
Free Thyroxine Index: 1.3 (ref 1.2–4.9)
T3 Uptake Ratio: 19 % — ABNORMAL LOW (ref 24–39)
T4, Total: 6.8 ug/dL (ref 4.5–12.0)
TSH: 10.2 u[IU]/mL — ABNORMAL HIGH (ref 0.450–4.500)

## 2020-11-18 LAB — ANA,IFA RA DIAG PNL W/RFLX TIT/PATN
ANA Titer 1: NEGATIVE
Cyclic Citrullin Peptide Ab: 10 units (ref 0–19)
Rheumatoid fact SerPl-aCnc: 10.3 IU/mL (ref <14.0)

## 2020-11-18 LAB — SARS CORONAVIRUS 2 (TAT 6-24 HRS): SARS Coronavirus 2: NEGATIVE

## 2020-11-20 MED ORDER — BUPIVACAINE LIPOSOME 1.3 % IJ SUSP
20.0000 mL | INTRAMUSCULAR | Status: DC
  Filled 2020-11-20 (×2): qty 20

## 2020-11-21 ENCOUNTER — Observation Stay (HOSPITAL_COMMUNITY)
Admission: RE | Admit: 2020-11-21 | Discharge: 2020-11-22 | Disposition: A | Discharge status: Home / Self Care | Payer: Medicaid Other | Source: Ambulatory Visit | Attending: Orthopedic Surgery | Admitting: Orthopedic Surgery

## 2020-11-21 ENCOUNTER — Ambulatory Visit (HOSPITAL_COMMUNITY): Payer: Medicaid Other | Admitting: Physician Assistant

## 2020-11-21 ENCOUNTER — Other Ambulatory Visit: Payer: Self-pay

## 2020-11-21 ENCOUNTER — Ambulatory Visit (HOSPITAL_COMMUNITY): Payer: Medicaid Other | Admitting: Certified Registered Nurse Anesthetist

## 2020-11-21 ENCOUNTER — Encounter (HOSPITAL_COMMUNITY): Payer: Self-pay | Admitting: Orthopedic Surgery

## 2020-11-21 ENCOUNTER — Encounter (HOSPITAL_COMMUNITY)
Admission: RE | Disposition: A | Discharge status: Home / Self Care | Payer: Self-pay | Source: Ambulatory Visit | Attending: Orthopedic Surgery

## 2020-11-21 DIAGNOSIS — I10 Essential (primary) hypertension: Secondary | ICD-10-CM | POA: Insufficient documentation

## 2020-11-21 DIAGNOSIS — Z7984 Long term (current) use of oral hypoglycemic drugs: Secondary | ICD-10-CM | POA: Diagnosis not present

## 2020-11-21 DIAGNOSIS — E039 Hypothyroidism, unspecified: Secondary | ICD-10-CM | POA: Diagnosis not present

## 2020-11-21 DIAGNOSIS — E119 Type 2 diabetes mellitus without complications: Secondary | ICD-10-CM | POA: Diagnosis not present

## 2020-11-21 DIAGNOSIS — M1712 Unilateral primary osteoarthritis, left knee: Secondary | ICD-10-CM | POA: Diagnosis not present

## 2020-11-21 DIAGNOSIS — F17210 Nicotine dependence, cigarettes, uncomplicated: Secondary | ICD-10-CM | POA: Insufficient documentation

## 2020-11-21 DIAGNOSIS — Z79899 Other long term (current) drug therapy: Secondary | ICD-10-CM | POA: Insufficient documentation

## 2020-11-21 DIAGNOSIS — M1710 Unilateral primary osteoarthritis, unspecified knee: Secondary | ICD-10-CM | POA: Diagnosis present

## 2020-11-21 DIAGNOSIS — M179 Osteoarthritis of knee, unspecified: Secondary | ICD-10-CM | POA: Diagnosis present

## 2020-11-21 HISTORY — PX: TOTAL KNEE ARTHROPLASTY: SHX125

## 2020-11-21 LAB — TYPE AND SCREEN
ABO/RH(D): A POS
Antibody Screen: NEGATIVE

## 2020-11-21 LAB — ABO/RH: ABO/RH(D): A POS

## 2020-11-21 LAB — GLUCOSE, CAPILLARY
Glucose-Capillary: 110 mg/dL — ABNORMAL HIGH (ref 70–99)
Glucose-Capillary: 95 mg/dL (ref 70–99)

## 2020-11-21 SURGERY — ARTHROPLASTY, KNEE, TOTAL
Anesthesia: Spinal | Site: Knee | Laterality: Left

## 2020-11-21 MED ORDER — LEVETIRACETAM 500 MG PO TABS
500.0000 mg | ORAL_TABLET | Freq: Two times a day (BID) | ORAL | Status: DC
  Administered 2020-11-21 – 2020-11-22 (×2): 500 mg via ORAL
  Filled 2020-11-21 (×2): qty 1

## 2020-11-21 MED ORDER — DEXAMETHASONE SODIUM PHOSPHATE 10 MG/ML IJ SOLN
8.0000 mg | Freq: Once | INTRAMUSCULAR | Status: AC
  Administered 2020-11-21: 8 mg via INTRAVENOUS

## 2020-11-21 MED ORDER — RIVAROXABAN 10 MG PO TABS: 10.0000 mg | ORAL_TABLET | Freq: Every day | ORAL | Status: DC

## 2020-11-21 MED ORDER — ROPIVACAINE HCL 5 MG/ML IJ SOLN
INTRAMUSCULAR | Status: DC | PRN
  Administered 2020-11-21: 20 mL via PERINEURAL

## 2020-11-21 MED ORDER — ONDANSETRON HCL 4 MG/2ML IJ SOLN
INTRAMUSCULAR | Status: AC
  Filled 2020-11-21: qty 2

## 2020-11-21 MED ORDER — ORAL CARE MOUTH RINSE: 15.0000 mL | Freq: Once | OROMUCOSAL | Status: AC

## 2020-11-21 MED ORDER — ONDANSETRON HCL 4 MG PO TABS: 4.0000 mg | ORAL_TABLET | Freq: Four times a day (QID) | ORAL | Status: DC | PRN

## 2020-11-21 MED ORDER — MIDAZOLAM HCL 2 MG/2ML IJ SOLN
1.0000 mg | Freq: Once | INTRAMUSCULAR | Status: AC
  Administered 2020-11-21: 2 mg via INTRAVENOUS
  Filled 2020-11-21: qty 2

## 2020-11-21 MED ORDER — DIPHENHYDRAMINE HCL 12.5 MG/5ML PO ELIX
12.5000 mg | ORAL_SOLUTION | ORAL | Status: DC | PRN
Start: 2020-11-21 — End: 2020-11-22

## 2020-11-21 MED ORDER — LIDOCAINE 2% (20 MG/ML) 5 ML SYRINGE
INTRAMUSCULAR | Status: AC
  Filled 2020-11-21: qty 5

## 2020-11-21 MED ORDER — PROPOFOL 500 MG/50ML IV EMUL
INTRAVENOUS | Status: DC | PRN
  Administered 2020-11-21: 125 ug/kg/min via INTRAVENOUS

## 2020-11-21 MED ORDER — PROPOFOL 1000 MG/100ML IV EMUL
INTRAVENOUS | Status: AC
  Filled 2020-11-21: qty 100

## 2020-11-21 MED ORDER — DEXAMETHASONE SODIUM PHOSPHATE 10 MG/ML IJ SOLN
INTRAMUSCULAR | Status: AC
  Filled 2020-11-21: qty 1

## 2020-11-21 MED ORDER — PROPOFOL 10 MG/ML IV BOLUS
INTRAVENOUS | Status: DC | PRN
  Administered 2020-11-21: 20 mg via INTRAVENOUS

## 2020-11-21 MED ORDER — SODIUM CHLORIDE (PF) 0.9 % IJ SOLN
INTRAMUSCULAR | Status: AC
  Filled 2020-11-21: qty 10

## 2020-11-21 MED ORDER — LEVOTHYROXINE SODIUM 50 MCG PO TABS
175.0000 ug | ORAL_TABLET | Freq: Every day | ORAL | Status: DC
  Administered 2020-11-22: 175 ug via ORAL
  Filled 2020-11-21: qty 1

## 2020-11-21 MED ORDER — MORPHINE SULFATE (PF) 2 MG/ML IV SOLN
1.0000 mg | Freq: Once | INTRAVENOUS | Status: AC
  Administered 2020-11-21: 1 mg via INTRAVENOUS
  Filled 2020-11-21: qty 1

## 2020-11-21 MED ORDER — PHENYLEPHRINE HCL-NACL 10-0.9 MG/250ML-% IV SOLN
INTRAVENOUS | Status: DC | PRN
  Administered 2020-11-21: 50 ug/min via INTRAVENOUS

## 2020-11-21 MED ORDER — METOCLOPRAMIDE HCL 5 MG PO TABS
5.0000 mg | ORAL_TABLET | Freq: Three times a day (TID) | ORAL | Status: DC | PRN
Start: 2020-11-21 — End: 2020-11-22

## 2020-11-21 MED ORDER — SODIUM CHLORIDE (PF) 0.9 % IJ SOLN
INTRAMUSCULAR | Status: DC | PRN
  Administered 2020-11-21: 60 mL

## 2020-11-21 MED ORDER — CYCLOBENZAPRINE HCL 10 MG PO TABS
10.0000 mg | ORAL_TABLET | Freq: Three times a day (TID) | ORAL | Status: DC | PRN
  Administered 2020-11-21: 10 mg via ORAL
  Filled 2020-11-21 (×2): qty 1

## 2020-11-21 MED ORDER — ASPIRIN EC 325 MG PO TBEC
325.0000 mg | DELAYED_RELEASE_TABLET | Freq: Two times a day (BID) | ORAL | Status: DC
  Administered 2020-11-22: 325 mg via ORAL
  Filled 2020-11-21: qty 1

## 2020-11-21 MED ORDER — OXYCODONE HCL 5 MG PO TABS
5.0000 mg | ORAL_TABLET | ORAL | Status: DC | PRN
  Administered 2020-11-21 – 2020-11-22 (×3): 10 mg via ORAL
  Administered 2020-11-22: 5 mg via ORAL
  Filled 2020-11-21 (×3): qty 2
  Filled 2020-11-21: qty 1

## 2020-11-21 MED ORDER — FENTANYL CITRATE (PF) 100 MCG/2ML IJ SOLN: 25.0000 ug | INTRAMUSCULAR | Status: DC | PRN

## 2020-11-21 MED ORDER — PHENOL 1.4 % MT LIQD: 1.0000 | OROMUCOSAL | Status: DC | PRN

## 2020-11-21 MED ORDER — DOCUSATE SODIUM 100 MG PO CAPS
100.0000 mg | ORAL_CAPSULE | Freq: Two times a day (BID) | ORAL | Status: DC
  Administered 2020-11-21 – 2020-11-22 (×2): 100 mg via ORAL
  Filled 2020-11-21 (×2): qty 1

## 2020-11-21 MED ORDER — PREGABALIN 75 MG PO CAPS
75.0000 mg | ORAL_CAPSULE | Freq: Three times a day (TID) | ORAL | Status: DC
  Administered 2020-11-21 – 2020-11-22 (×4): 75 mg via ORAL
  Filled 2020-11-21 (×4): qty 1

## 2020-11-21 MED ORDER — ACETAMINOPHEN 10 MG/ML IV SOLN
1000.0000 mg | Freq: Four times a day (QID) | INTRAVENOUS | Status: DC
  Administered 2020-11-21: 1000 mg via INTRAVENOUS
  Filled 2020-11-21: qty 100

## 2020-11-21 MED ORDER — METOCLOPRAMIDE HCL 5 MG/ML IJ SOLN
5.0000 mg | Freq: Three times a day (TID) | INTRAMUSCULAR | Status: DC | PRN
Start: 2020-11-21 — End: 2020-11-22

## 2020-11-21 MED ORDER — FENOFIBRATE 160 MG PO TABS
160.0000 mg | ORAL_TABLET | Freq: Every day | ORAL | Status: DC
  Filled 2020-11-21: qty 1

## 2020-11-21 MED ORDER — SODIUM CHLORIDE 0.9 % IR SOLN
Status: DC | PRN
  Administered 2020-11-21 (×2): 1000 mL

## 2020-11-21 MED ORDER — BUPIVACAINE IN DEXTROSE 0.75-8.25 % IT SOLN
INTRATHECAL | Status: DC | PRN
  Administered 2020-11-21: 1.6 mL via INTRATHECAL

## 2020-11-21 MED ORDER — POLYETHYLENE GLYCOL 3350 17 G PO PACK: 17.0000 g | PACK | Freq: Every day | ORAL | Status: DC | PRN

## 2020-11-21 MED ORDER — ONDANSETRON HCL 4 MG/2ML IJ SOLN
INTRAMUSCULAR | Status: DC | PRN
  Administered 2020-11-21: 4 mg via INTRAVENOUS

## 2020-11-21 MED ORDER — DEXAMETHASONE SODIUM PHOSPHATE 10 MG/ML IJ SOLN
10.0000 mg | Freq: Once | INTRAMUSCULAR | Status: AC
  Administered 2020-11-22: 10 mg via INTRAVENOUS
  Filled 2020-11-21: qty 1

## 2020-11-21 MED ORDER — HYDROCHLOROTHIAZIDE 25 MG PO TABS
25.0000 mg | ORAL_TABLET | Freq: Every day | ORAL | Status: DC
  Administered 2020-11-22: 25 mg via ORAL
  Filled 2020-11-21: qty 1

## 2020-11-21 MED ORDER — ALBUTEROL SULFATE HFA 108 (90 BASE) MCG/ACT IN AERS: 2.0000 | INHALATION_SPRAY | Freq: Four times a day (QID) | RESPIRATORY_TRACT | Status: DC | PRN

## 2020-11-21 MED ORDER — MENTHOL 3 MG MT LOZG: 1.0000 | LOZENGE | OROMUCOSAL | Status: DC | PRN

## 2020-11-21 MED ORDER — PROMETHAZINE HCL 25 MG/ML IJ SOLN: 6.2500 mg | INTRAMUSCULAR | Status: DC | PRN

## 2020-11-21 MED ORDER — HYDROXYZINE HCL 25 MG PO TABS: 25.0000 mg | ORAL_TABLET | Freq: Three times a day (TID) | ORAL | Status: DC | PRN

## 2020-11-21 MED ORDER — LACTATED RINGERS IV SOLN: INTRAVENOUS | Status: DC

## 2020-11-21 MED ORDER — BUPIVACAINE LIPOSOME 1.3 % IJ SUSP
INTRAMUSCULAR | Status: DC | PRN
  Administered 2020-11-21: 20 mL

## 2020-11-21 MED ORDER — VANCOMYCIN HCL 1000 MG/200ML IV SOLN
1000.0000 mg | Freq: Two times a day (BID) | INTRAVENOUS | Status: AC
  Administered 2020-11-21: 1000 mg via INTRAVENOUS
  Filled 2020-11-21: qty 200

## 2020-11-21 MED ORDER — FENTANYL CITRATE (PF) 100 MCG/2ML IJ SOLN
INTRAMUSCULAR | Status: AC
  Filled 2020-11-21: qty 2

## 2020-11-21 MED ORDER — ONDANSETRON HCL 4 MG/2ML IJ SOLN: 4.0000 mg | Freq: Four times a day (QID) | INTRAMUSCULAR | Status: DC | PRN

## 2020-11-21 MED ORDER — ACETAMINOPHEN 325 MG PO TABS
325.0000 mg | ORAL_TABLET | Freq: Four times a day (QID) | ORAL | Status: DC | PRN
  Administered 2020-11-21: 650 mg via ORAL
  Filled 2020-11-21: qty 2

## 2020-11-21 MED ORDER — TRAMADOL HCL 50 MG PO TABS: 50.0000 mg | ORAL_TABLET | Freq: Four times a day (QID) | ORAL | Status: DC | PRN

## 2020-11-21 MED ORDER — TRAMADOL HCL 50 MG PO TABS
50.0000 mg | ORAL_TABLET | Freq: Three times a day (TID) | ORAL | Status: DC | PRN
  Administered 2020-11-21: 50 mg via ORAL
  Filled 2020-11-21 (×2): qty 1

## 2020-11-21 MED ORDER — LIDOCAINE 2% (20 MG/ML) 5 ML SYRINGE
INTRAMUSCULAR | Status: DC | PRN
  Administered 2020-11-21: 50 mg via INTRAVENOUS

## 2020-11-21 MED ORDER — TRANEXAMIC ACID-NACL 1000-0.7 MG/100ML-% IV SOLN
1000.0000 mg | INTRAVENOUS | Status: AC
  Administered 2020-11-21: 1000 mg via INTRAVENOUS
  Filled 2020-11-21: qty 100

## 2020-11-21 MED ORDER — CHLORHEXIDINE GLUCONATE 0.12 % MT SOLN
15.0000 mL | Freq: Once | OROMUCOSAL | Status: AC
  Administered 2020-11-21: 15 mL via OROMUCOSAL

## 2020-11-21 MED ORDER — MORPHINE SULFATE (PF) 2 MG/ML IV SOLN
1.0000 mg | INTRAVENOUS | Status: DC | PRN
  Administered 2020-11-21: 1 mg via INTRAVENOUS
  Filled 2020-11-21: qty 1

## 2020-11-21 MED ORDER — SODIUM CHLORIDE 0.9 % IV SOLN: INTRAVENOUS | Status: DC

## 2020-11-21 MED ORDER — FENTANYL CITRATE (PF) 100 MCG/2ML IJ SOLN
50.0000 ug | Freq: Once | INTRAMUSCULAR | Status: AC
Start: 2020-11-21 — End: 2020-11-21
  Administered 2020-11-21: 100 ug via INTRAVENOUS
  Filled 2020-11-21: qty 2

## 2020-11-21 MED ORDER — BISACODYL 10 MG RE SUPP: 10.0000 mg | Freq: Every day | RECTAL | Status: DC | PRN

## 2020-11-21 MED ORDER — POVIDONE-IODINE 10 % EX SWAB
2.0000 "application " | Freq: Once | CUTANEOUS | Status: AC
  Administered 2020-11-21: 2 via TOPICAL

## 2020-11-21 MED ORDER — DULOXETINE HCL 60 MG PO CPEP
60.0000 mg | ORAL_CAPSULE | Freq: Every day | ORAL | Status: DC
  Administered 2020-11-21 – 2020-11-22 (×2): 60 mg via ORAL
  Filled 2020-11-21 (×2): qty 1

## 2020-11-21 MED ORDER — DARUN-COBIC-EMTRICIT-TENOFAF 800-150-200-10 MG PO TABS
1.0000 | ORAL_TABLET | Freq: Every day | ORAL | Status: DC
  Administered 2020-11-21 – 2020-11-22 (×2): 1 via ORAL
  Filled 2020-11-21 (×2): qty 1

## 2020-11-21 MED ORDER — PROPOFOL 10 MG/ML IV BOLUS
INTRAVENOUS | Status: AC
  Filled 2020-11-21: qty 20

## 2020-11-21 MED ORDER — HYDRALAZINE HCL 20 MG/ML IJ SOLN
10.0000 mg | Freq: Four times a day (QID) | INTRAMUSCULAR | Status: DC | PRN
  Administered 2020-11-21 (×2): 10 mg via INTRAVENOUS
  Filled 2020-11-21 (×2): qty 1

## 2020-11-21 MED ORDER — FLEET ENEMA 7-19 GM/118ML RE ENEM: 1.0000 | ENEMA | Freq: Once | RECTAL | Status: DC | PRN

## 2020-11-21 MED ORDER — VANCOMYCIN HCL IN DEXTROSE 1-5 GM/200ML-% IV SOLN
1000.0000 mg | INTRAVENOUS | Status: AC
  Administered 2020-11-21: 1000 mg via INTRAVENOUS
  Filled 2020-11-21: qty 200

## 2020-11-21 SURGICAL SUPPLY — 57 items
ATTUNE PS FEM LT SZ 6 CEM KNEE (Femur) ×1 IMPLANT
ATTUNE PSRP INSR SZ6 8 KNEE (Insert) ×1 IMPLANT
BAG DECANTER FOR FLEXI CONT (MISCELLANEOUS) ×1 IMPLANT
BAG SPEC THK2 15X12 ZIP CLS (MISCELLANEOUS) ×1
BAG ZIPLOCK 12X15 (MISCELLANEOUS) ×2 IMPLANT
BASE TIBIAL ROT PLAT SZ 5 KNEE (Knees) IMPLANT
BLADE SAG 18X100X1.27 (BLADE) ×2 IMPLANT
BLADE SAW SGTL 11.0X1.19X90.0M (BLADE) ×2 IMPLANT
BNDG ELASTIC 6X5.8 VLCR STR LF (GAUZE/BANDAGES/DRESSINGS) ×2 IMPLANT
BOWL SMART MIX CTS (DISPOSABLE) ×2 IMPLANT
BSPLAT TIB 5 CMNT ROT PLAT STR (Knees) ×1 IMPLANT
CEMENT HV SMART SET (Cement) ×4 IMPLANT
COVER SURGICAL LIGHT HANDLE (MISCELLANEOUS) ×2 IMPLANT
COVER WAND RF STERILE (DRAPES) IMPLANT
CUFF TOURN SGL QUICK 34 (TOURNIQUET CUFF) ×2
CUFF TRNQT CYL 34X4.125X (TOURNIQUET CUFF) ×1 IMPLANT
DECANTER SPIKE VIAL GLASS SM (MISCELLANEOUS) ×2 IMPLANT
DRAPE U-SHAPE 47X51 STRL (DRAPES) ×2 IMPLANT
DRSG AQUACEL AG ADV 3.5X10 (GAUZE/BANDAGES/DRESSINGS) ×2 IMPLANT
DURAPREP 26ML APPLICATOR (WOUND CARE) ×2 IMPLANT
ELECT REM PT RETURN 15FT ADLT (MISCELLANEOUS) ×2 IMPLANT
GLOVE SRG 8 PF TXTR STRL LF DI (GLOVE) ×1 IMPLANT
GLOVE SURG ENC MOIS LTX SZ6.5 (GLOVE) ×2 IMPLANT
GLOVE SURG ENC MOIS LTX SZ8 (GLOVE) ×4 IMPLANT
GLOVE SURG UNDER POLY LF SZ7 (GLOVE) ×2 IMPLANT
GLOVE SURG UNDER POLY LF SZ8 (GLOVE) ×2
GLOVE SURG UNDER POLY LF SZ8.5 (GLOVE) ×2 IMPLANT
GOWN STRL REUS W/TWL LRG LVL3 (GOWN DISPOSABLE) ×4 IMPLANT
GOWN STRL REUS W/TWL XL LVL3 (GOWN DISPOSABLE) ×2 IMPLANT
HANDPIECE INTERPULSE COAX TIP (DISPOSABLE) ×2
HOLDER FOLEY CATH W/STRAP (MISCELLANEOUS) ×1 IMPLANT
HOOD PEEL AWAY FLYTE STAYCOOL (MISCELLANEOUS) ×4 IMPLANT
IMMOBILIZER KNEE 20 (SOFTGOODS) ×2
IMMOBILIZER KNEE 20 THIGH 36 (SOFTGOODS) ×1 IMPLANT
KIT TURNOVER KIT A (KITS) ×2 IMPLANT
MANIFOLD NEPTUNE II (INSTRUMENTS) ×2 IMPLANT
NS IRRIG 1000ML POUR BTL (IV SOLUTION) ×2 IMPLANT
PACK TOTAL KNEE CUSTOM (KITS) ×2 IMPLANT
PADDING CAST COTTON 6X4 STRL (CAST SUPPLIES) ×3 IMPLANT
PATELLA MEDIAL ATTUN 35MM KNEE (Knees) ×1 IMPLANT
PENCIL SMOKE EVACUATOR (MISCELLANEOUS) ×2 IMPLANT
PIN DRILL FIX HALF THREAD (BIT) ×1 IMPLANT
PIN STEINMAN FIXATION KNEE (PIN) ×1 IMPLANT
PROTECTOR NERVE ULNAR (MISCELLANEOUS) ×2 IMPLANT
SET HNDPC FAN SPRY TIP SCT (DISPOSABLE) ×1 IMPLANT
STRIP CLOSURE SKIN 1/2X4 (GAUZE/BANDAGES/DRESSINGS) ×4 IMPLANT
SUT MNCRL AB 4-0 PS2 18 (SUTURE) ×2 IMPLANT
SUT STRATAFIX 0 PDS 27 VIOLET (SUTURE) ×2
SUT VIC AB 2-0 CT1 27 (SUTURE) ×6
SUT VIC AB 2-0 CT1 TAPERPNT 27 (SUTURE) ×3 IMPLANT
SUTURE STRATFX 0 PDS 27 VIOLET (SUTURE) ×1 IMPLANT
TIBIAL BASE ROT PLAT SZ 5 KNEE (Knees) ×2 IMPLANT
TRAY FOLEY MTR SLVR 14FR STAT (SET/KITS/TRAYS/PACK) ×1 IMPLANT
TRAY FOLEY MTR SLVR 16FR STAT (SET/KITS/TRAYS/PACK) ×1 IMPLANT
TUBE SUCTION HIGH CAP CLEAR NV (SUCTIONS) ×2 IMPLANT
WATER STERILE IRR 1000ML POUR (IV SOLUTION) ×4 IMPLANT
WRAP KNEE MAXI GEL POST OP (GAUZE/BANDAGES/DRESSINGS) ×2 IMPLANT

## 2020-11-21 NOTE — Anesthesia Preprocedure Evaluation (Signed)
Anesthesia Evaluation  Patient identified by MRN, date of birth, ID band Patient awake    Reviewed: Allergy & Precautions, NPO status , Patient's Chart, lab work & pertinent test results  Airway Mallampati: II  TM Distance: >3 FB Neck ROM: Full    Dental  (+) Dental Advisory Given   Pulmonary Current Smoker,    breath sounds clear to auscultation       Cardiovascular hypertension, Pt. on medications  Rhythm:Regular Rate:Normal     Neuro/Psych Seizures -,  CVA    GI/Hepatic hiatal hernia, (+) Hepatitis -, C  Endo/Other  diabetes, Type 2, Oral Hypoglycemic AgentsHypothyroidism   Renal/GU negative Renal ROS     Musculoskeletal  (+) Arthritis ,   Abdominal   Peds  Hematology  (+) HIV,   Anesthesia Other Findings   Reproductive/Obstetrics                             Anesthesia Physical Anesthesia Plan  ASA: III  Anesthesia Plan: Spinal   Post-op Pain Management:  Regional for Post-op pain   Induction:   PONV Risk Score and Plan: 1 and Propofol infusion, Ondansetron and Treatment may vary due to age or medical condition  Airway Management Planned: Natural Airway and Simple Face Mask  Additional Equipment:   Intra-op Plan:   Post-operative Plan:   Informed Consent: I have reviewed the patients History and Physical, chart, labs and discussed the procedure including the risks, benefits and alternatives for the proposed anesthesia with the patient or authorized representative who has indicated his/her understanding and acceptance.       Plan Discussed with:   Anesthesia Plan Comments:         Anesthesia Quick Evaluation

## 2020-11-21 NOTE — Anesthesia Procedure Notes (Signed)
Anesthesia Regional Block: Adductor canal block   Pre-Anesthetic Checklist: ,, timeout performed, Correct Patient, Correct Site, Correct Laterality, Correct Procedure, Correct Position, site marked, Risks and benefits discussed,  Surgical consent,  Pre-op evaluation,  At surgeon's request and post-op pain management  Laterality: Left  Prep: chloraprep       Needles:  Injection technique: Single-shot  Needle Type: Echogenic Needle     Needle Length: 9cm  Needle Gauge: 21     Additional Needles:   Procedures:,,,, ultrasound used (permanent image in chart),,,,  Narrative:  Start time: 11/21/2020 10:32 AM End time: 11/21/2020 10:36 AM Injection made incrementally with aspirations every 5 mL.  Performed by: Personally  Anesthesiologist: Suzette Battiest, MD

## 2020-11-21 NOTE — Discharge Instructions (Addendum)
 Frank Aluisio, MD Total Joint Specialist [facility] [address] [place], [place] [address] [phone]  TOTAL KNEE REPLACEMENT POSTOPERATIVE DIRECTIONS    Knee Rehabilitation, Guidelines Following Surgery  Results after knee surgery are often greatly improved when you follow the exercise, range of motion and muscle strengthening exercises prescribed by your doctor. Safety measures are also important to protect the knee from further injury. If any of these exercises cause you to have increased pain or swelling in your knee joint, decrease the amount until you are comfortable again and slowly increase them. If you have problems or questions, call your caregiver or physical therapist for advice.   BLOOD CLOT PREVENTION . Take a 10 mg Xarelto once a day for three weeks following surgery. Then take an 81 mg Aspirin once a day for three weeks. Then discontinue Aspirin. . You may resume your vitamins/supplements once you have discontinued the Xarelto. . Do not take any NSAIDs (Advil, Aleve, Ibuprofen, Meloxicam, etc.) until you have discontinued the Xarelto.   HOME CARE INSTRUCTIONS  . Remove items at home which could result in a fall. This includes throw rugs or furniture in walking pathways.  . ICE to the affected knee as much as tolerated. Icing helps control swelling. If the swelling is well controlled you will be more comfortable and rehab easier. Continue to use ice on the knee for pain and swelling from surgery. You may notice swelling that will progress down to the foot and ankle. This is normal after surgery. Elevate the leg when you are not up walking on it.    . Continue to use the breathing machine which will help keep your temperature down. It is common for your temperature to cycle up and down following surgery, especially at night when you are not up moving around and exerting yourself. The breathing machine keeps your lungs expanded and your  temperature down. . Do not place pillow under the operative knee, focus on keeping the knee straight while resting  DIET You may resume your previous home diet once you are discharged from the hospital.  DRESSING / WOUND CARE / SHOWERING . Keep your bulky bandage on for 2 days. On the third post-operative day you may remove the Ace bandage and gauze. There is a waterproof adhesive bandage on your skin which will stay in place until your first follow-up appointment. Once you remove this you will not need to place another bandage . You may begin showering 3 days following surgery, but do not submerge the incision under water.  ACTIVITY For the first 5 days, the key is rest and control of pain and swelling . Do your home exercises twice a day starting on post-operative day 3. On the days you go to physical therapy, just do the home exercises once that day. . You should rest, ice and elevate the leg for 50 minutes out of every hour. Get up and walk/stretch for 10 minutes per hour. After 5 days you can increase your activity slowly as tolerated. . Walk with your walker as instructed. Use the walker until you are comfortable transitioning to a cane. Walk with the cane in the opposite hand of the operative leg. You may discontinue the cane once you are comfortable and walking steadily. . Avoid periods of inactivity such as sitting longer than an hour when not asleep. This helps prevent blood clots.  . You may discontinue the knee immobilizer once you are able to perform a straight leg   raise while lying down. . You may resume a sexual relationship in one month or when given the OK by your doctor.  . You may return to work once you are cleared by your doctor.  . Do not drive a car for 6 weeks or until released by your surgeon.  . Do not drive while taking narcotics.  TED HOSE STOCKINGS Wear the elastic stockings on both legs for three weeks following surgery during the day. You may remove them at night  for sleeping.  WEIGHT BEARING Weight bearing as tolerated with assist device (walker, cane, etc) as directed, use it as long as suggested by your surgeon or therapist, typically at least 4-6 weeks.  POSTOPERATIVE CONSTIPATION PROTOCOL Constipation - defined medically as fewer than three stools per week and severe constipation as less than one stool per week.  One of the most common issues patients have following surgery is constipation.  Even if you have a regular bowel pattern at home, your normal regimen is likely to be disrupted due to multiple reasons following surgery.  Combination of anesthesia, postoperative narcotics, change in appetite and fluid intake all can affect your bowels.  In order to avoid complications following surgery, here are some recommendations in order to help you during your recovery period.  . Colace (docusate) - Pick up an over-the-counter form of Colace or another stool softener and take twice a day as long as you are requiring postoperative pain medications.  Take with a full glass of water daily.  If you experience loose stools or diarrhea, hold the colace until you stool forms back up. If your symptoms do not get better within 1 week or if they get worse, check with your doctor. . Dulcolax (bisacodyl) - Pick up over-the-counter and take as directed by the product packaging as needed to assist with the movement of your bowels.  Take with a full glass of water.  Use this product as needed if not relieved by Colace only.  . MiraLax (polyethylene glycol) - Pick up over-the-counter to have on hand. MiraLax is a solution that will increase the amount of water in your bowels to assist with bowel movements.  Take as directed and can mix with a glass of water, juice, soda, coffee, or tea. Take if you go more than two days without a movement. Do not use MiraLax more than once per day. Call your doctor if you are still constipated or irregular after using this medication for 7 days  in a row.  If you continue to have problems with postoperative constipation, please contact the office for further assistance and recommendations.  If you experience "the worst abdominal pain ever" or develop nausea or vomiting, please contact the office immediatly for further recommendations for treatment.  ITCHING If you experience itching with your medications, try taking only a single pain pill, or even half a pain pill at a time.  You can also use Benadryl over the counter for itching or also to help with sleep.   MEDICATIONS See your medication summary on the "After Visit Summary" that the nursing staff will review with you prior to discharge.  You may have some home medications which will be placed on hold until you complete the course of blood thinner medication.  It is important for you to complete the blood thinner medication as prescribed by your surgeon.  Continue your approved medications as instructed at time of discharge.  PRECAUTIONS . If you experience chest pain or shortness of breath -   call 911 immediately for transfer to the hospital emergency department.  . If you develop a fever greater that 101 F, purulent drainage from wound, increased redness or drainage from wound, foul odor from the wound/dressing, or calf pain - CONTACT YOUR SURGEON.                                                   FOLLOW-UP APPOINTMENTS Make sure you keep all of your appointments after your operation with your surgeon and caregivers. You should call the office at the above phone number and make an appointment for approximately two weeks after the date of your surgery or on the date instructed by your surgeon outlined in the "After Visit Summary".  RANGE OF MOTION AND STRENGTHENING EXERCISES  Rehabilitation of the knee is important following a knee injury or an operation. After just a few days of immobilization, the muscles of the thigh which control the knee become weakened and shrink (atrophy). Knee  exercises are designed to build up the tone and strength of the thigh muscles and to improve knee motion. Often times heat used for twenty to thirty minutes before working out will loosen up your tissues and help with improving the range of motion but do not use heat for the first two weeks following surgery. These exercises can be done on a training (exercise) mat, on the floor, on a table or on a bed. Use what ever works the best and is most comfortable for you Knee exercises include:  . Leg Lifts - While your knee is still immobilized in a splint or cast, you can do straight leg raises. Lift the leg to 60 degrees, hold for 3 sec, and slowly lower the leg. Repeat 10-20 times 2-3 times daily. Perform this exercise against resistance later as your knee gets better.  Javier Docker and Hamstring Sets - Tighten up the muscle on the front of the thigh (Quad) and hold for 5-10 sec. Repeat this 10-20 times hourly. Hamstring sets are done by pushing the foot backward against an object and holding for 5-10 sec. Repeat as with quad sets.   Leg Slides: Lying on your back, slowly slide your foot toward your buttocks, bending your knee up off the floor (only go as far as is comfortable). Then slowly slide your foot back down until your leg is flat on the floor again.  Angel Wings: Lying on your back spread your legs to the side as far apart as you can without causing discomfort.  A rehabilitation program following serious knee injuries can speed recovery and prevent re-injury in the future due to weakened muscles. Contact your doctor or a physical therapist for more information on knee rehabilitation.   POST-OPERATIVE OPIOID TAPER INSTRUCTIONS: . It is important to wean off of your opioid medication as soon as possible. If you do not need pain medication after your surgery it is ok to stop day one. Marland Kitchen Opioids include: o Codeine, Hydrocodone(Norco, Vicodin), Oxycodone(Percocet, oxycontin) and hydromorphone amongst others.   . Long term and even short term use of opiods can cause: o Increased pain response o Dependence o Constipation o Depression o Respiratory depression o And more.  . Withdrawal symptoms can include o Flu like symptoms o Nausea, vomiting o And more . Techniques to manage these symptoms o Hydrate well o Eat regular healthy meals  o Stay active o Use relaxation techniques(deep breathing, meditating, yoga) . Do Not substitute Alcohol to help with tapering . If you have been on opioids for less than two weeks and do not have pain than it is ok to stop all together.  . Plan to wean off of opioids o This plan should start within one week post op of your joint replacement. o Maintain the same interval or time between taking each dose and first decrease the dose.  o Cut the total daily intake of opioids by one tablet each day o Next start to increase the time between doses. o The last dose that should be eliminated is the evening dose.     IF YOU ARE TRANSFERRED TO A SKILLED REHAB FACILITY If the patient is transferred to a skilled rehab facility following release from the hospital, a list of the current medications will be sent to the facility for the patient to continue.  When discharged from the skilled rehab facility, please have the facility set up the patient's [place] prior to being released. Also, the skilled facility will be responsible for providing the patient with their medications at time of release from the facility to include their pain medication, the muscle relaxants, and their blood thinner medication. If the patient is still at the rehab facility at time of the two week follow up appointment, the skilled rehab facility will also need to assist the patient in arranging follow up appointment in our office and any transportation needs.  MAKE SURE YOU:  . Understand these instructions.  . Get help right away if you are not doing well or get worse.   DENTAL  ANTIBIOTICS:  In most cases prophylactic antibiotics for Dental procdeures after total joint surgery are not necessary.  Exceptions are as follows:  1. History of prior total joint infection  2. Severely immunocompromised (Organ Transplant, cancer chemotherapy, Rheumatoid biologic meds such as [place])  3. Poorly controlled diabetes (A1C &gt; 8.0, blood glucose over 200)  If you have one of these conditions, contact your surgeon for an antibiotic prescription, prior to your dental procedure.    Pick up stool softner and laxative for home use following surgery while on pain medications. Do not submerge incision under water. Please use good hand washing techniques while changing dressing each day. May shower starting three days after surgery. Please use a clean towel to pat the incision dry following showers. Continue to use ice for pain and swelling after surgery. Do not use any lotions or creams on the incision until instructed by your surgeon.  _____________________________________________________________________________________________________________________________

## 2020-11-21 NOTE — Evaluation (Signed)
Physical Therapy Evaluation Patient Details Name: Cheryl Thompson MRN: [id number] DOB: 1966/04/25 Today's Date: 11/21/2020   History of Present Illness  Patient is 55 y.o. female s/p Lt TKA On 11/21/20 with PMH significant for Stroke with Lt sided residual weakness, seizures, HTN, hypohyroidism, HIV+, OA, HLD, DM, depression, anxiety.    Clinical Impression  Cheryl Thompson is a 55 y.o. female POD 0 s/p Lt TKA. Patient reports independence with mobility at baseline. Patient is now limited by functional impairments (see PT problem list below) and requires min-mod assist for transfers and gait with RW. Patient was able to take several small steps forward and turn with RW and min assist to step to reclienr. Patient instructed in exercise to facilitate circulation to manage edema and reduce risk of DVT. Patient will benefit from continued skilled PT interventions to address impairments and progress towards PLOF. Acute PT will follow to progress mobility and stair training in preparation for safe discharge home.     Follow Up Recommendations Follow surgeon's recommendation for DC plan and follow-up therapies;Outpatient PT    Equipment Recommendations  Rolling walker with 5" wheels;3in1 (PT)    Recommendations for Other Services       Precautions / Restrictions Precautions Precautions: Fall Restrictions Weight Bearing Restrictions: No Other Position/Activity Restrictions: WBAT      Mobility  Bed Mobility Overal bed mobility: Needs Assistance Bed Mobility: Supine to Sit                Transfers Overall transfer level: Needs assistance Equipment used: Rolling walker (2 wheeled) Transfers: Sit to/from [facility] Sit to Stand: Mod assist;+2 safety/equipment;+2 physical assistance;From elevated surface Stand pivot transfers: Min assist;+2 safety/equipment;From elevated surface          Ambulation/Gait                Stairs            Wheelchair  Mobility    Modified Rankin (Stroke Patients Only)       Balance Overall balance assessment: Needs assistance Sitting-balance support: Feet supported;Bilateral upper extremity supported Sitting balance-Leahy Scale: Fair     Standing balance support: During functional activity;Bilateral upper extremity supported Standing balance-Leahy Scale: Poor Standing balance comment: heavy reliance on RW and external support                             Pertinent Vitals/Pain Pain Assessment: Faces Faces Pain Scale: Hurts whole lot Pain Location: Lt knee Pain Descriptors / Indicators: Aching;Discomfort;Grimacing;Guarding Pain Intervention(s): Limited activity within patient's tolerance;Monitored during session;Premedicated before session;Repositioned;Ice applied    Home Living Family/patient expects to be discharged to:: Private residence Living Arrangements: Alone Available Help at Discharge: Family Type of Home: House Home Access: Stairs to enter Entrance Stairs-Rails: None Entrance Stairs-Number of Steps: 2 Home Layout: One level Home Equipment: Cane - single point;Toilet riser Additional Comments: pt reports her brother is going to stay and help her for 2 weeks    Prior Function Level of Independence: Independent               Hand Dominance   Dominant Hand: Right    Extremity/Trunk Assessment   Upper Extremity Assessment Upper Extremity Assessment: Overall WFL for tasks assessed    Lower Extremity Assessment Lower Extremity Assessment: LLE deficits/detail LLE: Unable to fully assess due to pain LLE Sensation: WNL LLE Coordination: WNL    Cervical / Trunk Assessment Cervical / Trunk Assessment:  Normal  Communication   Communication: No difficulties  Cognition Arousal/Alertness: Awake/alert Behavior During Therapy: WFL for tasks assessed/performed Overall Cognitive Status: Within Functional Limits for tasks assessed                                         General Comments      Exercises Total Joint Exercises Ankle Circles/Pumps: AROM;Both;20 reps;Seated   Assessment/Plan    PT Assessment Patient needs continued PT services  PT Problem List Decreased strength;Decreased range of motion;Decreased activity tolerance;Decreased balance;Decreased mobility;Decreased knowledge of use of DME;Decreased knowledge of precautions;Pain       PT Treatment Interventions DME instruction;Gait training;Stair training;Functional mobility training;Therapeutic activities;Therapeutic exercise;Balance training;Patient/family education    PT Goals (Current goals can be found in the Care Plan section)  Acute Rehab PT Goals Patient Stated Goal: get through PT to recover independence and stop hurting PT Goal Formulation: With patient Time For Goal Achievement: 11/28/20 Potential to Achieve Goals: Good    Frequency 7X/week   Barriers to discharge        Co-evaluation               AM-PAC PT "6 Clicks" Mobility  Outcome Measure Help needed turning from your back to your side while in a flat bed without using bedrails?: A Little Help needed moving from lying on your back to sitting on the side of a flat bed without using bedrails?: A Little Help needed moving to and from a bed to a chair (including a wheelchair)?: A Lot Help needed standing up from a chair using your arms (e.g., wheelchair or bedside chair)?: A Lot Help needed to walk in hospital room?: A Lot Help needed climbing 3-5 steps with a railing? : Total 6 Click Score: 13    End of Session Equipment Utilized During Treatment: Gait belt Activity Tolerance: Patient limited by pain Patient left: in chair;with call bell/phone within reach;with chair alarm set;with family/visitor present Nurse Communication: Mobility status;Patient requests pain meds PT Visit Diagnosis: Muscle weakness (generalized) (M62.81);Difficulty in walking, not elsewhere classified (R26.2)     Time: 1640-1701 PT Time Calculation (min) (ACUTE ONLY): 21 min   Charges:   PT Evaluation $PT Eval Low Complexity: 1 Low          Verner Mould, DPT Acute Rehabilitation Services Office [phone] Pager [phone]    Jacques Navy 11/21/2020, 5:32 PM

## 2020-11-21 NOTE — Anesthesia Procedure Notes (Signed)
Procedure Name: MAC Date/Time: 11/21/2020 11:20 AM Performed by: Claudia Desanctis, CRNA Pre-anesthesia Checklist: Patient identified, Emergency Drugs available, Suction available and Patient being monitored Patient Re-evaluated:Patient Re-evaluated prior to induction Oxygen Delivery Method: Simple face mask

## 2020-11-21 NOTE — Op Note (Signed)
OPERATIVE REPORT-TOTAL KNEE ARTHROPLASTY   Pre-operative diagnosis- Osteoarthritis  Left knee(s)  Post-operative diagnosis- Osteoarthritis Left knee(s)  Procedure-  Left  Total Knee Arthroplasty  Surgeon- Dione Plover. Marios Gaiser, MD  Assistant- Molli Barrows, PA-C   Anesthesia-  Adductor canal block and spinal  EBL-25 mL   Drains None  Tourniquet time-  Total Tourniquet Time Documented: area (Left) - 36 minutes Total: area (Left) - 36 minutes     Complications- None  Condition-PACU - hemodynamically stable.   Brief Clinical Note  Cheryl Thompson is a 55 y.o. year old female with end stage OA of her left knee with progressively worsening pain and dysfunction. She has constant pain, with activity and at rest and significant functional deficits with difficulties even with ADLs. She has had extensive non-op management including analgesics, injections of cortisone and viscosupplements, and home exercise program, but remains in significant pain with significant dysfunction. Radiographs show bone on bone arthritis medial and patellofemoral. She presents now for left Total Knee Arthroplasty.    Procedure in detail---   The patient is brought into the operating room and positioned supine on the operating table. After successful administration of  Adductor canal block and spinal,   a tourniquet is placed high on the  Left thigh(s) and the lower extremity is prepped and draped in the usual sterile fashion. Time out is performed by the operating team and then the  Left lower extremity is wrapped in Esmarch, knee flexed and the tourniquet inflated to 300 mmHg.       A midline incision is made with a ten blade through the subcutaneous tissue to the level of the extensor mechanism. A fresh blade is used to make a medial parapatellar arthrotomy. Soft tissue over the proximal medial tibia is subperiosteally elevated to the joint line with a knife and into the semimembranosus bursa with a Cobb elevator.  Soft tissue over the proximal lateral tibia is elevated with attention being paid to avoiding the patellar tendon on the tibial tubercle. The patella is everted, knee flexed 90 degrees and the ACL and PCL are removed. Findings are bone on bone medial and patellofemoral with large global osteophytes        The drill is used to create a starting hole in the distal femur and the canal is thoroughly irrigated with sterile saline to remove the fatty contents. The 5 degree Left  valgus alignment guide is placed into the femoral canal and the distal femoral cutting block is pinned to remove 9 mm off the distal femur. Resection is made with an oscillating saw.      The tibia is subluxed forward and the menisci are removed. The extramedullary alignment guide is placed referencing proximally at the medial aspect of the tibial tubercle and distally along the second metatarsal axis and tibial crest. The block is pinned to remove 49mm off the more deficient medial  side. Resection is made with an oscillating saw. Size 5is the most appropriate size for the tibia and the proximal tibia is prepared with the modular drill and keel punch for that size.      The femoral sizing guide is placed and size 6 is most appropriate. Rotation is marked off the epicondylar axis and confirmed by creating a rectangular flexion gap at 90 degrees. The size 6 cutting block is pinned in this rotation and the anterior, posterior and chamfer cuts are made with the oscillating saw. The intercondylar block is then placed and that cut is made.  Trial size 5 tibial component, trial size 6 posterior stabilized femur and a 8  mm posterior stabilized rotating platform insert trial is placed. Full extension is achieved with excellent varus/valgus and anterior/posterior balance throughout full range of motion. The patella is everted and thickness measured to be 24  mm. Free hand resection is taken to 14 mm, a 35 template is placed, lug holes are drilled,  trial patella is placed, and it tracks normally. Osteophytes are removed off the posterior femur with the trial in place. All trials are removed and the cut bone surfaces prepared with pulsatile lavage. Cement is mixed and once ready for implantation, the size 5 tibial implant, size  6 posterior stabilized femoral component, and the size 35 patella are cemented in place and the patella is held with the clamp. The trial insert is placed and the knee held in full extension. The Exparel (20 ml mixed with 60 ml saline) is injected into the extensor mechanism, posterior capsule, medial and lateral gutters and subcutaneous tissues.  All extruded cement is removed and once the cement is hard the permanent 8 mm posterior stabilized rotating platform insert is placed into the tibial tray.      The wound is copiously irrigated with saline solution and the extensor mechanism closed with # 0 Stratofix suture. The tourniquet is released for a total tourniquet time of 36  minutes. Flexion against gravity is 140 degrees and the patella tracks normally. Subcutaneous tissue is closed with 2.0 vicryl and subcuticular with running 4.0 Monocryl. The incision is cleaned and dried and steri-strips and a bulky sterile dressing are applied. The limb is placed into a knee immobilizer and the patient is awakened and transported to recovery in stable condition.      Please note that a surgical assistant was a medical necessity for this procedure in order to perform it in a safe and expeditious manner. Surgical assistant was necessary to retract the ligaments and vital neurovascular structures to prevent injury to them and also necessary for proper positioning of the limb to allow for anatomic placement of the prosthesis.   Dione Plover Swetha Rayle, MD    11/21/2020, 12:22 PM

## 2020-11-21 NOTE — Progress Notes (Signed)
Orthopedic Tech Progress Note Patient Details:  Cheryl Thompson 06/02/1966 [id number]  CPM Left Knee CPM Left Knee: On Left Knee Flexion (Degrees): 40 Left Knee Extension (Degrees): 10  Post Interventions Patient Tolerated: Well  [name] [name] 11/21/2020, 12:56 PM

## 2020-11-21 NOTE — Interval H&P Note (Signed)
History and Physical Interval Note:  11/21/2020 10:19 AM  Cheryl Thompson  has presented today for surgery, with the diagnosis of BILATERAL  knee osteoarthritis.  The various methods of treatment have been discussed with the patient and family. After consideration of risks, benefits and other options for treatment, the patient has consented to  Procedure(s) with comments: TOTAL KNEE ARTHROPLASTY, RIGHT CORTISONE INJECTIONS (Left) - 48min as a surgical intervention.  The patient's history has been reviewed, patient examined, no change in status, stable for surgery.  I have reviewed the patient's chart and labs.  Questions were answered to the patient's satisfaction.     Pilar Plate An Lannan

## 2020-11-21 NOTE — Transfer of Care (Signed)
Immediate Anesthesia Transfer of Care Note  Patient: Cheryl Thompson  Procedure(s) Performed: TOTAL KNEE ARTHROPLASTY, RIGHT CORTISONE INJECTIONS (Left Knee)  Patient Location: PACU  Anesthesia Type:Spinal  Level of Consciousness: awake and patient cooperative  Airway & Oxygen Therapy: Patient Spontanous Breathing and Patient connected to face mask  Post-op Assessment: Report given to RN and Post -op Vital signs reviewed and stable  Post vital signs: Reviewed and stable  Last Vitals:  Vitals Value Taken Time  BP 124/84 11/21/20 1249  Temp    Pulse 80 11/21/20 1251  Resp 16 11/21/20 1251  SpO2 100 % 11/21/20 1251  Vitals shown include unvalidated device data.  Last Pain:  Vitals:   11/21/20 1036  TempSrc:   PainSc: 0-No pain      Patients Stated Pain Goal: 4 (84/66/59 [id number])  Complications: No complications documented.

## 2020-11-21 NOTE — Plan of Care (Signed)
?  Problem: Education: ?Goal: Knowledge of General Education information will improve ?Description: Including pain rating scale, medication(s)/side effects and non-pharmacologic comfort measures ?Outcome: Progressing ?  ?Problem: Activity: ?Goal: Risk for activity intolerance will decrease ?Outcome: Progressing ?  ?Problem: Nutrition: ?Goal: Adequate nutrition will be maintained ?Outcome: Progressing ?  ?Problem: Elimination: ?Goal: Will not experience complications related to bowel motility ?Outcome: Progressing ?  ?Problem: Pain Managment: ?Goal: General experience of comfort will improve ?Outcome: Progressing ?  ?Problem: Education: ?Goal: Knowledge of the prescribed therapeutic regimen will improve ?Outcome: Progressing ?  ?Problem: Activity: ?Goal: Ability to avoid complications of mobility impairment will improve ?Outcome: Progressing ?  ?Problem: Pain Management: ?Goal: Pain level will decrease with appropriate interventions ?Outcome: Progressing ?  ?

## 2020-11-21 NOTE — Anesthesia Procedure Notes (Addendum)
Spinal  Patient location during procedure: OR Start time: 11/21/2020 11:07 AM End time: 11/21/2020 11:14 AM Reason for block: surgical anesthesia Staffing Performed: anesthesiologist  Anesthesiologist: Suzette Battiest, MD Preanesthetic Checklist Completed: patient identified, IV checked, site marked, risks and benefits discussed, surgical consent, monitors and equipment checked, pre-op evaluation and timeout performed Spinal Block Patient position: sitting Prep: DuraPrep Patient monitoring: heart rate, cardiac monitor, continuous pulse ox and blood pressure Approach: midline Location: L4-5 Injection technique: single-shot Needle Needle type: Pencan  Needle gauge: 24 G Needle length: 9 cm Assessment Sensory level: T4 Events: CSF return

## 2020-11-21 NOTE — Anesthesia Postprocedure Evaluation (Signed)
Anesthesia Post Note  Patient: Cheryl Thompson  Procedure(s) Performed: TOTAL KNEE ARTHROPLASTY, RIGHT CORTISONE INJECTIONS (Left Knee)     Patient location during evaluation: PACU Anesthesia Type: Spinal Level of consciousness: awake and alert Pain management: pain level controlled Vital Signs Assessment: post-procedure vital signs reviewed and stable Respiratory status: spontaneous breathing and respiratory function stable Cardiovascular status: blood pressure returned to baseline and stable Postop Assessment: spinal receding Anesthetic complications: no   No complications documented.  Last Vitals:  Vitals:   11/21/20 1338 11/21/20 1343  BP: (!) 143/100 (!) 150/99  Pulse: 75 79  Resp: 18 18  Temp: 36.8 C (!) 36.4 C  SpO2: 100% 100%    Last Pain:  Vitals:   11/21/20 1447  TempSrc:   PainSc: 8                  Tiajuana Amass

## 2020-11-21 NOTE — Progress Notes (Signed)
Assisted Dr. Rob Fitzgerald with left, ultrasound guided, adductor canal block. Side rails up, monitors on throughout procedure. See vital signs in flow sheet. Tolerated Procedure well.  

## 2020-11-22 ENCOUNTER — Encounter (HOSPITAL_COMMUNITY): Payer: Self-pay | Admitting: Orthopedic Surgery

## 2020-11-22 DIAGNOSIS — M1712 Unilateral primary osteoarthritis, left knee: Secondary | ICD-10-CM | POA: Diagnosis not present

## 2020-11-22 LAB — CBC
HCT: 31 % — ABNORMAL LOW (ref 36.0–46.0)
Hemoglobin: 10 g/dL — ABNORMAL LOW (ref 12.0–15.0)
MCH: 29.9 pg (ref 26.0–34.0)
MCHC: 32.3 g/dL (ref 30.0–36.0)
MCV: 92.5 fL (ref 80.0–100.0)
Platelets: 146 10*3/uL — ABNORMAL LOW (ref 150–400)
RBC: 3.35 MIL/uL — ABNORMAL LOW (ref 3.87–5.11)
RDW: 13.9 % (ref 11.5–15.5)
WBC: 15.2 10*3/uL — ABNORMAL HIGH (ref 4.0–10.5)
nRBC: 0 % (ref 0.0–0.2)

## 2020-11-22 LAB — BASIC METABOLIC PANEL
Anion gap: 6 (ref 5–15)
BUN: 12 mg/dL (ref 6–20)
CO2: 24 mmol/L (ref 22–32)
Calcium: 8.6 mg/dL — ABNORMAL LOW (ref 8.9–10.3)
Chloride: 103 mmol/L (ref 98–111)
Creatinine, Ser: 0.74 mg/dL (ref 0.44–1.00)
GFR, Estimated: >60 mL/min (ref >60)
Glucose, Bld: 144 mg/dL — ABNORMAL HIGH (ref 70–99)
Potassium: 3.7 mmol/L (ref 3.5–5.1)
Sodium: 133 mmol/L — ABNORMAL LOW (ref 135–145)

## 2020-11-22 MED ORDER — TRAMADOL HCL 50 MG PO TABS: 50.0000 mg | ORAL_TABLET | Freq: Three times a day (TID) | ORAL | 0 refills | Status: DC | PRN

## 2020-11-22 MED ORDER — CYCLOBENZAPRINE HCL 10 MG PO TABS: 10.0000 mg | ORAL_TABLET | Freq: Three times a day (TID) | ORAL | 0 refills | Status: DC | PRN

## 2020-11-22 MED ORDER — OXYCODONE HCL 5 MG PO TABS: 5.0000 mg | ORAL_TABLET | ORAL | 0 refills | Status: DC | PRN

## 2020-11-22 MED ORDER — ASPIRIN 325 MG PO TBEC: 325.0000 mg | DELAYED_RELEASE_TABLET | Freq: Two times a day (BID) | ORAL | 0 refills | Status: AC

## 2020-11-22 MED ORDER — PREGABALIN 75 MG PO CAPS: ORAL_CAPSULE | ORAL | 0 refills | Status: DC

## 2020-11-22 NOTE — TOC Transition Note (Signed)
Transition of Care [facility]) - CM/SW Discharge Note   Patient Details  Name: Cheryl Thompson MRN: [id number] Date of Birth: 04-Oct-1965  [facility] CM/SW Contact:  Lennart Pall, LCSW Phone Number: 11/22/2020, 10:31 AM   Clinical Narrative:    Met briefly with pt and confirming need for rw and 3n1 commode - orders with [employer].  Plan for OPPT via [place] [facility].  No further TOC needs.   Final next level of care: OP Rehab Barriers to Discharge: No Barriers Identified   Patient Goals and CMS Choice Patient states their goals for this hospitalization and ongoing recovery are:: return home      Discharge Placement                       Discharge Plan and Services                DME Arranged: 3-N-1,Walker rolling DME Agency: [employer] Date DME Agency Contacted:  (orders placed prior to admit)                Social Determinants of Health (SDOH) Interventions     Readmission Risk Interventions No flowsheet data found.

## 2020-11-22 NOTE — Plan of Care (Signed)
Patient discharged home in stable condition, waiting on her ride 

## 2020-11-22 NOTE — Plan of Care (Signed)
Pt was in severe pain at start of shift and very fatigued. Pt has reported some relief and has been sleeping through the night. Activity tolerance has increased.  Problem: Activity: Goal: Risk for activity intolerance will decrease Outcome: Progressing   Problem: Pain Managment: Goal: General experience of comfort will improve Outcome: Progressing   Problem: Pain Management: Goal: Pain level will decrease with appropriate interventions Outcome: Progressing   Problem: Clinical Measurements: Goal: Postoperative complications will be avoided or minimized Outcome: Progressing

## 2020-11-22 NOTE — Progress Notes (Signed)
   11/22/20 1400  PT Visit Information  Last PT Received On 11/22/20  Assistance Needed +1 excellent progress. Pt is ready to d/c with family assist from PT standpoint  History of Present Illness Patient is 55 y.o. female s/p Lt TKA On 11/21/20 with PMH significant for Stroke with Lt sided residual weakness, seizures, HTN, hypohyroidism, HIV+, OA, HLD, DM, depression, anxiety.  Subjective Data  Patient Stated Goal get through PT to recover independence and stop hurting  Precautions  Precautions Fall;Knee  Required Braces or Orthoses Knee Immobilizer - Left  Knee Immobilizer - Left Discontinue once straight leg raise with < 10 degree lag  Restrictions  Other Position/Activity Restrictions WBAT  Pain Assessment  Pain Assessment 0-10  Faces Pain Scale 4  Pain Location Lt knee  Pain Descriptors / Indicators Aching;Discomfort;Grimacing;Guarding  Pain Intervention(s) Limited activity within patient's tolerance;Monitored during session;Repositioned;Ice applied  Cognition  Arousal/Alertness Awake/alert  Behavior During Therapy WFL for tasks assessed/performed  Overall Cognitive Status Within Functional Limits for tasks assessed  Bed Mobility  General bed mobility comments in recliner  Transfers  Overall transfer level Needs assistance  Equipment used Rolling walker (2 wheeled)  Transfers Sit to/from Stand  Sit to Stand Supervision  Ambulation/Gait  Ambulation/Gait assistance Supervision;Min guard  Gait Distance (Feet) 70 Feet  Assistive device Rolling walker (2 wheeled)  Gait Pattern/deviations Step-to pattern  Stairs Yes  Stairs assistance Min guard;Min assist  Stair Management No rails;One rail Right;Step to pattern;Backwards;With walker  Number of Stairs 2  General stair comments up steps backwards with RW and min assist for walker cues for sequence; pt hold on to post and descends stairs backward at baseline, repeated this/practiced today--pt performed with min/guard and cues for  sequence  Exercises  Exercises Total Joint  PT - End of Session  Equipment Utilized During Treatment Gait belt  Activity Tolerance Patient limited by pain  Patient left in chair;with call bell/phone within reach;with chair alarm set;with family/visitor present  Nurse Communication Mobility status;Patient requests pain meds   PT - Assessment/Plan  PT Visit Diagnosis Muscle weakness (generalized) (M62.81);Difficulty in walking, not elsewhere classified (R26.2)  PT Frequency (ACUTE ONLY) 7X/week  Follow Up Recommendations Follow surgeon's recommendation for DC plan and follow-up therapies;Outpatient PT  PT equipment Rolling walker with 5" wheels;3in1 (PT)  AM-PAC PT "6 Clicks" Mobility Outcome Measure (Version 2)  Help needed turning from your back to your side while in a flat bed without using bedrails? 3  Help needed moving from lying on your back to sitting on the side of a flat bed without using bedrails? 3  Help needed moving to and from a bed to a chair (including a wheelchair)? 3  Help needed standing up from a chair using your arms (e.g., wheelchair or bedside chair)? 3  Help needed to walk in hospital room? 3  Help needed climbing 3-5 steps with a railing?  1  6 Click Score 16  Consider Recommendation of Discharge To: Home with [facility]  PT Goal Progression  Progress towards PT goals Progressing toward goals  Acute Rehab PT Goals  PT Goal Formulation With patient  Time For Goal Achievement 11/28/20  Potential to Achieve Goals Good  PT Time Calculation  PT Start Time (ACUTE ONLY) 1422  PT Stop Time (ACUTE ONLY) 1437  PT Time Calculation (min) (ACUTE ONLY) 15 min  PT General Charges  $$ ACUTE PT VISIT 1 Visit  PT Treatments  $Gait Training 8-22 mins

## 2020-11-22 NOTE — Progress Notes (Signed)
Subjective: 1 Day Post-Op Procedure(s) (LRB): TOTAL KNEE ARTHROPLASTY, RIGHT CORTISONE INJECTIONS (Left) Patient reports pain as mild.   Patient seen in rounds by Dr. Wynelle Link. Patient is well, and has had no acute complaints or problems. Had to add in oxycodone for pain control last night, patients original goal was to avoid narcotics all together. Will send home with limited prescription. Denies chest pain, SOB, or calf pain. Foley catheter to be removed this AM. We will continue therapy today.   Objective: Vital signs in last 24 hours: Temp:  [97.5 F (36.4 C)-98.6 F (37 C)] 98.1 F (36.7 C) (05/10 0143) Pulse Rate:  [75-97] 96 (05/10 0143) Resp:  [16-19] 16 (05/10 0143) BP: (118-181)/(80-113) 118/80 (05/10 0143) SpO2:  [99 %-100 %] 100 % (05/10 0143) Weight:  [99.8 kg] 99.8 kg (05/09 1400)  Intake/Output from previous day:  Intake/Output Summary (Last 24 hours) at 11/22/2020 0740 Last data filed at 11/22/2020 0604 Gross per 24 hour  Intake 3440.34 ml  Output 1375 ml  Net 2065.34 ml     Intake/Output this shift: No intake/output data recorded.  Labs: Recent Labs    11/22/20 0336  HGB 10.0*   Recent Labs    11/22/20 0336  WBC 15.2*  RBC 3.35*  HCT 31.0*  PLT 146*   Recent Labs    11/22/20 0336  NA 133*  K 3.7  CL 103  CO2 24  BUN 12  CREATININE 0.74  GLUCOSE 144*  CALCIUM 8.6*   No results for input(s): LABPT, INR in the last 72 hours.  Exam: General - Patient is Alert and Oriented Extremity - Neurologically intact Neurovascular intact Sensation intact distally Dorsiflexion/Plantar flexion intact Dressing - dressing C/D/I Motor Function - intact, moving foot and toes well on exam.   Past Medical History:  Diagnosis Date  . Acute alcoholic hepatitis   . Alcoholism ([place])   . Anxiety   . Arthritis   . Colon polyps   . Depression   . Diabetes ([place])    borderline on Metformin   . Diverticulitis y-1  . Diverticulosis y-1  . Hepatitis C    . Hiatal hernia   . HIV (human immunodeficiency virus infection) ([place])   . HLD (hyperlipidemia)   . Hypertension   . Hypothyroidism   . Pneumonia    as a teenager   . Seizures ([place])   . Stroke ([place])    weakness on left side     Assessment/Plan: 1 Day Post-Op Procedure(s) (LRB): TOTAL KNEE ARTHROPLASTY, RIGHT CORTISONE INJECTIONS (Left) Principal Problem:   OA (osteoarthritis) of knee Active Problems:   Primary osteoarthritis of left knee  Estimated body mass index is 34.46 kg/m as calculated from the following:   Height as of this encounter: 5\' 7"  (1.702 m).   Weight as of this encounter: 99.8 kg. Advance diet Up with therapy D/C IV fluids   Patient's anticipated LOS is less than 2 midnights, meeting these requirements: - Younger than 64 - Lives within 1 hour of care - Has a competent adult at home to recover with post-op recover - NO history of  - Chronic pain requiring opiods  - Diabetes  - Coronary Artery Disease  - Heart failure  - Heart attack  - Stroke  - DVT/VTE  - Cardiac arrhythmia  - Respiratory Failure/COPD  - Renal failure  - Anemia  - Advanced Liver disease  History of TIA, however cannot have Xarelto or Eliquis with HIV medication per pharmacy. Will have to use  ASA for DVT prophylaxis.   DVT Prophylaxis - Aspirin Weight bearing as tolerated. Continue therapy.  Plan is to go Home after hospital stay. Plan for discharge later today if progresses with therapy and meeting her goals. Scheduled for OPPT at [facility]) Follow-up in the office in 2 weeks  The PDMP database was reviewed today prior to any opioid medications being prescribed to this patient.  Theresa Duty, PA-C Orthopedic Surgery [phone] 11/22/2020, 7:40 AM

## 2020-11-22 NOTE — Progress Notes (Signed)
Physical Therapy Treatment Patient Details Name: Cheryl Thompson MRN: [id number] DOB: Nov 22, 1965 Today's Date: 11/22/2020    History of Present Illness Patient is 55 y.o. female s/p Lt TKA On 11/21/20 with PMH significant for Stroke with Lt sided residual weakness, seizures, HTN, hypohyroidism, HIV+, OA, HLD, DM, depression, anxiety.    PT Comments    Pt  Progressing well with gait and ex program. Will see for a second session and pt should be ready for d/c later today  Follow Up Recommendations  Follow surgeon's recommendation for DC plan and follow-up therapies;Outpatient PT     Equipment Recommendations  Rolling walker with 5" wheels;3in1 (PT)    Recommendations for Other Services       Precautions / Restrictions Precautions Precautions: Fall;Knee Restrictions Weight Bearing Restrictions: No Other Position/Activity Restrictions: WBAT    Mobility  Bed Mobility Overal bed mobility: Needs Assistance Bed Mobility: Supine to Sit     Supine to sit: Min assist     General bed mobility comments: assist with LLE, instructed in use of gait belt as leg lifter    Transfers Overall transfer level: Needs assistance Equipment used: Rolling walker (2 wheeled) Transfers: Sit to/from Stand Sit to Stand: Min guard;Min assist            Ambulation/Gait Ambulation/Gait assistance: Supervision;Min guard Gait Distance (Feet): 80 Feet Assistive device: Rolling walker (2 wheeled) Gait Pattern/deviations: Step-to pattern         Stairs             Wheelchair Mobility    Modified Rankin (Stroke Patients Only)       Balance Overall balance assessment: Needs assistance Sitting-balance support: Feet supported;Bilateral upper extremity supported Sitting balance-Leahy Scale: Fair     Standing balance support: During functional activity;Bilateral upper extremity supported Standing balance-Leahy Scale: Poor Standing balance comment: heavy reliance on RW and  external support                            Cognition Arousal/Alertness: Awake/alert Behavior During Therapy: WFL for tasks assessed/performed Overall Cognitive Status: Within Functional Limits for tasks assessed                                        Exercises Total Joint Exercises Ankle Circles/Pumps: AROM;Both;20 reps;Seated Quad Sets: AROM;Both;10 reps Heel Slides: AROM;AAROM;Left;10 reps Straight Leg Raises: AAROM;Left;10 reps    General Comments        Pertinent Vitals/Pain Pain Assessment: 0-10 Faces Pain Scale: Hurts little more Pain Location: Lt knee Pain Descriptors / Indicators: Aching;Discomfort;Grimacing;Guarding Pain Intervention(s): Limited activity within patient's tolerance;Monitored during session;Premedicated before session;Repositioned    Home Living                      Prior Function            PT Goals (current goals can now be found in the care plan section) Acute Rehab PT Goals Patient Stated Goal: get through PT to recover independence and stop hurting PT Goal Formulation: With patient Time For Goal Achievement: 11/28/20 Potential to Achieve Goals: Good Progress towards PT goals: Progressing toward goals    Frequency    7X/week      PT Plan      Co-evaluation              AM-PAC PT "6  Clicks" Mobility   Outcome Measure  Help needed turning from your back to your side while in a flat bed without using bedrails?: A Little Help needed moving from lying on your back to sitting on the side of a flat bed without using bedrails?: A Little Help needed moving to and from a bed to a chair (including a wheelchair)?: A Little Help needed standing up from a chair using your arms (e.g., wheelchair or bedside chair)?: A Little Help needed to walk in hospital room?: A Little Help needed climbing 3-5 steps with a railing? : Total 6 Click Score: 16    End of Session Equipment Utilized During Treatment:  Gait belt Activity Tolerance: Patient limited by pain Patient left: in chair;with call bell/phone within reach;with chair alarm set;with family/visitor present Nurse Communication: Mobility status;Patient requests pain meds PT Visit Diagnosis: Muscle weakness (generalized) (M62.81);Difficulty in walking, not elsewhere classified (R26.2)     Time: 8115-7262 PT Time Calculation (min) (ACUTE ONLY): 26 min  Charges:  $Gait Training: 8-22 mins $Therapeutic Exercise: 8-22 mins                     Baxter Flattery, PT  Acute Rehab Dept ([place]) [phone] Pager [phone]  11/22/2020    [facility] 11/22/2020, 11:27 AM

## 2020-11-24 ENCOUNTER — Other Ambulatory Visit: Payer: Self-pay

## 2020-11-24 ENCOUNTER — Encounter: Payer: Self-pay | Admitting: Physical Therapy

## 2020-11-24 ENCOUNTER — Ambulatory Visit: Payer: Medicaid Other | Attending: Student | Admitting: Physical Therapy

## 2020-11-24 DIAGNOSIS — M6281 Muscle weakness (generalized): Secondary | ICD-10-CM | POA: Insufficient documentation

## 2020-11-24 DIAGNOSIS — R2689 Other abnormalities of gait and mobility: Secondary | ICD-10-CM | POA: Insufficient documentation

## 2020-11-24 DIAGNOSIS — M25662 Stiffness of left knee, not elsewhere classified: Secondary | ICD-10-CM | POA: Diagnosis present

## 2020-11-24 DIAGNOSIS — R600 Localized edema: Secondary | ICD-10-CM | POA: Diagnosis present

## 2020-11-24 DIAGNOSIS — M25562 Pain in left knee: Secondary | ICD-10-CM | POA: Insufficient documentation
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
## 2020-11-28 ENCOUNTER — Telehealth: Payer: Self-pay

## 2020-11-28 ENCOUNTER — Other Ambulatory Visit: Payer: Self-pay | Admitting: Family Medicine

## 2020-11-28 DIAGNOSIS — E039 Hypothyroidism, unspecified: Secondary | ICD-10-CM

## 2020-11-28 MED ORDER — LEVOTHYROXINE SODIUM 175 MCG PO TABS: 175.0000 ug | ORAL_TABLET | Freq: Every day | ORAL | 1 refills | Status: DC

## 2020-11-28 NOTE — Telephone Encounter (Signed)
Med refill  levothyroxine

## 2020-11-28 NOTE — Discharge Summary (Signed)
Physician Discharge Summary   Patient ID: Cheryl Thompson MRN: [id number] DOB/AGE: Jan 22, 1966 55 y.o.  Admit date: 11/21/2020 Discharge date: 11/22/2020  Primary Diagnosis: Osteoarthritis, left knee   Admission Diagnoses:  Past Medical History:  Diagnosis Date  . Acute alcoholic hepatitis   . Alcoholism ([place])   . Anxiety   . Arthritis   . Colon polyps   . Depression   . Diabetes ([place])    borderline on Metformin   . Diverticulitis y-1  . Diverticulosis y-1  . Hepatitis C   . Hiatal hernia   . HIV (human immunodeficiency virus infection) ([place])   . HLD (hyperlipidemia)   . Hypertension   . Hypothyroidism   . Pneumonia    as a teenager   . Seizures ([place])   . Stroke [facility])    weakness on left side    Discharge Diagnoses:   Principal Problem:   OA (osteoarthritis) of knee Active Problems:   Primary osteoarthritis of left knee  Estimated body mass index is 34.46 kg/m as calculated from the following:   Height as of this encounter: '5\' 7"'  (1.702 m).   Weight as of this encounter: 99.8 kg.  Procedure:  Procedure(s) (LRB): TOTAL KNEE ARTHROPLASTY, RIGHT CORTISONE INJECTIONS (Left)   Consults: None  HPI: Cheryl Thompson is a 55 y.o. year old female with end stage OA of her left knee with progressively worsening pain and dysfunction. She has constant pain, with activity and at rest and significant functional deficits with difficulties even with ADLs. She has had extensive non-op management including analgesics, injections of cortisone and viscosupplements, and home exercise program, but remains in significant pain with significant dysfunction. Radiographs show bone on bone arthritis medial and patellofemoral. She presents now for left Total Knee Arthroplasty.    Laboratory Data: Admission on 11/21/2020, Discharged on 11/22/2020  Component Date Value Ref Range Status  . ABO/RH(D) 11/21/2020    Final                   Value:A POS Performed at [facility],  [place] [address] [place], [place] [address]   . Glucose-Capillary 11/21/2020 110* 70 - 99 mg/dL Final   Glucose reference range applies only to samples taken after fasting for at least 8 hours.  . Glucose-Capillary 11/21/2020 95  70 - 99 mg/dL Final   Glucose reference range applies only to samples taken after fasting for at least 8 hours.  . Comment 1 11/21/2020 Document in Chart   Final  . WBC 11/22/2020 15.2* 4.0 - 10.5 K/uL Final  . RBC 11/22/2020 3.35* 3.87 - 5.11 MIL/uL Final  . Hemoglobin 11/22/2020 10.0* 12.0 - 15.0 g/dL Final  . HCT 11/22/2020 31.0* 36.0 - 46.0 % Final  . MCV 11/22/2020 92.5  80.0 - 100.0 fL Final  . MCH 11/22/2020 29.9  26.0 - 34.0 pg Final  . MCHC 11/22/2020 32.3  30.0 - 36.0 g/dL Final  . RDW 11/22/2020 13.9  11.5 - 15.5 % Final  . Platelets 11/22/2020 146* 150 - 400 K/uL Final   REPEATED TO VERIFY  . nRBC 11/22/2020 0.0  0.0 - 0.2 % Final   Performed at [place] [address] [place], [place] [address]  . Sodium 11/22/2020 133* 135 - 145 mmol/L Final  . Potassium 11/22/2020 3.7  3.5 - 5.1 mmol/L Final  . Chloride 11/22/2020 103  98 - 111 mmol/L Final  . CO2 11/22/2020 24  22 - 32 mmol/L Final  .  Glucose, Bld 11/22/2020 144* 70 - 99 mg/dL Final   Glucose reference range applies only to samples taken after fasting for at least 8 hours.  . BUN 11/22/2020 12  6 - 20 mg/dL Final  . Creatinine, Ser 11/22/2020 0.74  0.44 - 1.00 mg/dL Final  . Calcium 11/22/2020 8.6* 8.9 - 10.3 mg/dL Final  . GFR, Estimated 11/22/2020 >60  >60 mL/min Final   Comment: (NOTE) Calculated using the CKD-EPI Creatinine Equation (2021)   . Anion gap 11/22/2020 6  5 - 15 Final   Performed at [facility], [place] [address] [place], [place] [address]  Hospital Outpatient Visit on 11/17/2020  Component Date Value Ref Range Status  . SARS Coronavirus 2 11/17/2020 NEGATIVE  NEGATIVE Final   Comment: (NOTE) SARS-CoV-2 target nucleic acids are  NOT DETECTED.  The SARS-CoV-2 RNA is generally detectable in upper and lower respiratory specimens during the acute phase of infection. Negative results do not preclude SARS-CoV-2 infection, do not rule out co-infections with other pathogens, and should not be used as the sole basis for treatment or other patient management decisions. Negative results must be combined with clinical observations, patient history, and epidemiological information. The expected result is Negative.  Fact Sheet for Patients: SugarRoll.be  Fact Sheet for Healthcare Providers: https://www.woods-mathews.com/  This test is not yet approved or cleared by the [place] FDA and  has been authorized for detection and/or diagnosis of SARS-CoV-2 by FDA under an Emergency Use Authorization (EUA). This EUA will remain  in effect (meaning this test can be used) for the duration of the COVID-19 declaration under Se                          ction 564(b)(1) of the Act, 21 U.S.C. section 360bbb-3(b)(1), unless the authorization is terminated or revoked sooner.  Performed at [facility], [place] [address] [place], [place] [address]   Appointment on 11/16/2020  Component Date Value Ref Range Status  . Glucose 11/16/2020 92  65 - 99 mg/dL Final  . BUN 11/16/2020 11  6 - 24 mg/dL Final  . Creatinine, Ser 11/16/2020 0.87  0.57 - 1.00 mg/dL Final  . eGFR 11/16/2020 79  >59 mL/min/1.73 Final  . BUN/Creatinine Ratio 11/16/2020 13  9 - 23 Final  . Sodium 11/16/2020 138  134 - 144 mmol/L Final  . Potassium 11/16/2020 3.5  3.5 - 5.2 mmol/L Final  . Chloride 11/16/2020 97  96 - 106 mmol/L Final  . CO2 11/16/2020 24  20 - 29 mmol/L Final  . Calcium 11/16/2020 9.3  8.7 - 10.2 mg/dL Final  . Total Protein 11/16/2020 8.0  6.0 - 8.5 g/dL Final  . Albumin 11/16/2020 4.2  3.8 - 4.9 g/dL Final  . Globulin, Total 11/16/2020 3.8  1.5 - 4.5 g/dL Final  . Albumin/Globulin Ratio  11/16/2020 1.1* 1.2 - 2.2 Final  . Bilirubin Total 11/16/2020 0.2  0.0 - 1.2 mg/dL Final  . Alkaline Phosphatase 11/16/2020 91  44 - 121 IU/L Final  . AST 11/16/2020 24  0 - 40 IU/L Final  . ALT 11/16/2020 17  0 - 32 IU/L Final  . TSH 11/16/2020 10.200* 0.450 - 4.500 uIU/mL Final  . T4, Total 11/16/2020 6.8  4.5 - 12.0 ug/dL Final  . T3 Uptake Ratio 11/16/2020 19* 24 - 39 % Final  . Free Thyroxine Index 11/16/2020 1.3  1.2 - 4.9 Final  . ANA Titer 1 11/16/2020 Negative  Final   Comment:                                      Negative   <1:80                                      Borderline  1:80                                      Positive   >1:80 ICAP nomenclature: AC-0 For more information about Hep-2 cell patterns use ANApatterns.org, the official website for the International Consensus on Antinuclear Antibody (ANA) Patterns (ICAP).   . Rhuematoid fact SerPl-aCnc 11/16/2020 10.3  <14.0 IU/mL Final  . Cyclic Citrullin Peptide Ab 11/16/2020 10  0 - 19 units Final   Comment:                           Negative               <20                           Weak positive      20 - 39                           Moderate positive  40 - 59                           Strong positive        >59   Hospital Outpatient Visit on 11/11/2020  Component Date Value Ref Range Status  . MRSA, PCR 11/11/2020 NEGATIVE  NEGATIVE Final  . Staphylococcus aureus 11/11/2020 POSITIVE* NEGATIVE Final   Comment: (NOTE) The Xpert SA Assay (FDA approved for NASAL specimens in patients 55 years of age and older), is one component of a comprehensive surveillance program. It is not intended to diagnose infection nor to guide or monitor treatment. Performed at [facility], [place] [address] [place], [place] [address]   . WBC 11/11/2020 9.0  4.0 - 10.5 K/uL Final  . RBC 11/11/2020 4.15  3.87 - 5.11 MIL/uL Final  . Hemoglobin 11/11/2020 12.4  12.0 - 15.0 g/dL Final  . HCT 11/11/2020 38.7  36.0 -  46.0 % Final  . MCV 11/11/2020 93.3  80.0 - 100.0 fL Final  . MCH 11/11/2020 29.9  26.0 - 34.0 pg Final  . MCHC 11/11/2020 32.0  30.0 - 36.0 g/dL Final  . RDW 11/11/2020 13.6  11.5 - 15.5 % Final  . Platelets 11/11/2020 140* 150 - 400 K/uL Final  . nRBC 11/11/2020 0.0  0.0 - 0.2 % Final   Performed at [facility], [place] [address], [place], [place] [address]  . Sodium 11/11/2020 139  135 - 145 mmol/L Final  . Potassium 11/11/2020 3.4* 3.5 - 5.1 mmol/L Final  . Chloride 11/11/2020 100  98 - 111 mmol/L Final  . CO2 11/11/2020 29  22 - 32 mmol/L Final  . Glucose, Bld 11/11/2020 100* 70 - 99 mg/dL Final   Glucose reference range applies only to  samples taken after fasting for at least 8 hours.  . BUN 11/11/2020 14  6 - 20 mg/dL Final  . Creatinine, Ser 11/11/2020 0.74  0.44 - 1.00 mg/dL Final  . Calcium 11/11/2020 9.5  8.9 - 10.3 mg/dL Final  . Total Protein 11/11/2020 8.3* 6.5 - 8.1 g/dL Final  . Albumin 11/11/2020 4.0  3.5 - 5.0 g/dL Final  . AST 11/11/2020 48* 15 - 41 U/L Final  . ALT 11/11/2020 40  0 - 44 U/L Final  . Alkaline Phosphatase 11/11/2020 73  38 - 126 U/L Final  . Total Bilirubin 11/11/2020 0.1* 0.3 - 1.2 mg/dL Final  . GFR, Estimated 11/11/2020 >60  >60 mL/min Final   Comment: (NOTE) Calculated using the CKD-EPI Creatinine Equation (2021)   . Anion gap 11/11/2020 10  5 - 15 Final   Performed at [facility], [place] [address] [place], [place] [address]  . Prothrombin Time 11/11/2020 12.8  11.4 - 15.2 seconds Final  . INR 11/11/2020 1.0  0.8 - 1.2 Final   Comment: (NOTE) INR goal varies based on device and disease states. Performed at [facility], [place] [address], [place], [place] [address]   . aPTT 11/11/2020 30  24 - 36 seconds Final   Performed at [facility], [place] [address] [place], [place] [address]  . ABO/RH(D) 11/11/2020 A POS   Final  . Antibody Screen 11/11/2020 NEG   Final  . Sample  Expiration 11/11/2020 11/24/2020,2359   Final  . Extend sample reason 11/11/2020    Final                   Value:NO TRANSFUSIONS OR PREGNANCY IN THE PAST 3 MONTHS Performed at [place] [address], [place], [place] [address]   . Glucose-Capillary 11/11/2020 110* 70 - 99 mg/dL Final   Glucose reference range applies only to samples taken after fasting for at least 8 hours.  . Hgb A1c MFr Bld 11/11/2020 6.4* 4.8 - 5.6 % Final   Comment: (NOTE) Pre diabetes:          5.7%-6.4%  Diabetes:              >6.4%  Glycemic control for   <7.0% adults with diabetes   . Mean Plasma Glucose 11/11/2020 136.98  mg/dL Final   Performed at [place] [address] [place], [place] [address]  Office Visit on 10/25/2020  Component Date Value Ref Range Status  . CD4 T Cell Abs 10/25/2020 1,607  400 - 1,790 /uL Final  . CD4 % Helper T Cell 10/25/2020 46  33 - 65 % Final   Performed at [facility], [place] [address], [place], [place] [address]  . HIV 1 RNA Quant 10/25/2020 <20* Copies/mL Final   HIV-1 RNA Detected  . HIV-1 RNA Quant, Log 10/25/2020 <1.30* Log cps/mL Final   Comment: HIV-1 RNA Detected . HIV-1 RNA was detected below 20 copies/mL. Viral nucleic acid detected below this level cannot be quantified by the assay. . . Reference Range:                           Not Detected     copies/mL                           Not Detected Log copies/mL . Marland Kitchen The test was  performed using Real-Time Polymerase Chain Reaction. . . Reportable Range: 20 copies/mL to 10,000,000 copies/mL (1.30 Log copies/mL to 7.00 Log copies/mL). .   . RPR Ser Ql 10/25/2020 NON-REACTIVE  NON-REACTI Final  . Neisseria Gonorrhea 10/25/2020 Negative   Final  . Chlamydia 10/25/2020 Negative   Final  . Comment 10/25/2020 Normal Reference Ranger Chlamydia - Negative   Final  . Comment 10/25/2020 Normal Reference Range Neisseria Gonorrhea - Negative   Final      X-Rays:MR BRAIN W WO CONTRAST  Result Date: 11/07/2020 CLINICAL DATA:  Remote left medial orbital EXAM: MRI HEAD WITHOUT AND WITH CONTRAST TECHNIQUE: Multiplanar, multiecho pulse sequences of the brain and surrounding structures were obtained without and with intravenous contrast. CONTRAST:  73m MULTIHANCE GADOBENATE DIMEGLUMINE 529 MG/ML IV SOLN COMPARISON:  CT head 03/12/2013.  MRI September 14, 2009. FINDINGS: Brain: No acute infarction, hemorrhage, hydrocephalus, extra-axial collection or mass lesion. Mild scattered punctate T2/FLAIR hyperintensities, nonspecific but most likely related to chronic microvascular ischemic disease that is within normal limits for patient age. No abnormal enhancement. Vascular: Major arterial flow voids are maintained at the skull base. Skull and upper cervical spine: Normal marrow signal. Sinuses/Orbits: Remote left medial orbital wall fracture. Otherwise, unremarkable orbits. Clear sinuses. Other: No mastoid effusions. IMPRESSION: No evidence of acute intracranial abnormality. Electronically Signed   By: FMargaretha SheffieldMD   On: 11/07/2020 16:27    EKG: Orders placed or performed in visit on 02/22/17  . EKG 12-Lead  . EKG     Hospital Course: LFarhiya Rostenis a 55y.o. [facility] They were brought to the operating room on 11/21/2020 and underwent Procedure(s): TOTAL KNEE ARTHROPLASTY, RIGHT CORTISONE INJECTIONS.  Patient tolerated the procedure well and was later transferred to the recovery room and then to the orthopaedic floor for postoperative care. They were given PO and IV analgesics for pain control following their surgery. They were given 24 hours of postoperative antibiotics of  Anti-infectives (From admission, onward)   Start     Dose/Rate Route Frequency Ordered Stop   11/21/20 2300  vancomycin (VANCOREADY) IVPB 1000 mg/200 mL        1,000 mg 200 mL/hr over 60 Minutes Intravenous Every 12 hours 11/21/20 1345 11/22/20  0004   11/21/20 1700  Darunavir-Cobicisctat-Emtricitabine-Tenofovir Alafenamide (SYMTUZA) 800-150-200-10 MG TABS 1 tablet  Status:  Discontinued        1 tablet Oral Daily with breakfast 11/21/20 1344 11/22/20 2250   11/21/20 0945  vancomycin (VANCOCIN) IVPB 1000 mg/200 mL premix        1,000 mg 200 mL/hr over 60 Minutes Intravenous On call to O.R. 11/21/20 0930 11/21/20 1138     and started on DVT prophylaxis in the form of Aspirin.   PT and OT were ordered for total joint protocol. Discharge planning consulted to help with postop disposition and equipment needs.  Patient had a good night on the evening of surgery. They started to get up OOB with therapy on POD #0. Pt was seen during rounds and was ready to go home pending progress with therapy. She worked with therapy on POD #1 and was meeting her goals. Pt was discharged to home later that day in stable condition.  Diet: Regular diet Activity: WBAT Follow-up: in 2 weeks Disposition: Home with OPPT Discharged Condition: stable   Discharge Instructions    Call MD / Call 911   Complete by: As directed    If you experience chest pain or  shortness of breath, CALL 911 and be transported to the hospital emergency room.  If you develope a fever above 101 F, pus (white drainage) or increased drainage or redness at the wound, or calf pain, call your surgeon's office.   Change dressing   Complete by: As directed    You may remove the bulky bandage (ACE wrap and gauze) two days after surgery. You will have an adhesive waterproof bandage underneath. Leave this in place until your first follow-up appointment.   Constipation Prevention   Complete by: As directed    Drink plenty of fluids.  Prune juice may be helpful.  You may use a stool softener, such as Colace (over the counter) 100 mg twice a day.  Use MiraLax (over the counter) for constipation as needed.   Diet - low sodium heart healthy   Complete by: As directed    Do not put a pillow under  the knee. Place it under the heel.   Complete by: As directed    Driving restrictions   Complete by: As directed    No driving for two weeks   Post-operative opioid taper instructions:   Complete by: As directed    POST-OPERATIVE OPIOID TAPER INSTRUCTIONS: It is important to wean off of your opioid medication as soon as possible. If you do not need pain medication after your surgery it is ok to stop day one. Opioids include: Codeine, Hydrocodone(Norco, Vicodin), Oxycodone(Percocet, oxycontin) and hydromorphone amongst others.  Long term and even short term use of opiods can cause: Increased pain response Dependence Constipation Depression Respiratory depression And more.  Withdrawal symptoms can include Flu like symptoms Nausea, vomiting And more Techniques to manage these symptoms Hydrate well Eat regular healthy meals Stay active Use relaxation techniques(deep breathing, meditating, yoga) Do Not substitute Alcohol to help with tapering If you have been on opioids for less than two weeks and do not have pain than it is ok to stop all together.  Plan to wean off of opioids This plan should start within one week post op of your joint replacement. Maintain the same interval or time between taking each dose and first decrease the dose.  Cut the total daily intake of opioids by one tablet each day Next start to increase the time between doses. The last dose that should be eliminated is the evening dose.      TED hose   Complete by: As directed    Use stockings (TED hose) for three weeks on both leg(s).  You may remove them at night for sleeping.   Weight bearing as tolerated   Complete by: As directed      Allergies as of 11/22/2020      Reactions   Penicillins Anaphylaxis   Naproxen Hives, Itching, Rash   Orange tablet=itching   Doxycycline Hives   blisters   Morphine And Related    Recovering narcotic user-prefers no narcs   Statins Nausea And Vomiting   Biktarvy  [bictegravir-emtricitab-tenofov] Rash      Medication List    STOP taking these medications   clotrimazole-betamethasone cream Commonly known as: Lotrisone   ibuprofen 800 MG tablet Commonly known as: ADVIL   triamcinolone ointment 0.5 % Commonly known as: KENALOG     TAKE these medications   albuterol 108 (90 Base) MCG/ACT inhaler Commonly known as: VENTOLIN HFA Inhale 2 puffs into the lungs every 6 (six) hours as needed for wheezing or shortness of breath.   aspirin 325 MG EC tablet Take  1 tablet (325 mg total) by mouth 2 (two) times daily for 20 days. Then take one 81 mg aspirin once a day for three weeks. Then discontinue aspirin.   BENGAY EX Apply 1 application topically daily as needed (pain).   cyclobenzaprine 10 MG tablet Commonly known as: FLEXERIL Take 1 tablet (10 mg total) by mouth 3 (three) times daily as needed for muscle spasms. What changed:   medication strength  how much to take  when to take this  reasons to take this   DULoxetine 60 MG capsule Commonly known as: CYMBALTA TAKE 1 CAPSULE(60 MG) BY MOUTH DAILY What changed:   how much to take  how to take this  when to take this  additional instructions   fenofibrate 145 MG tablet Commonly known as: TRICOR Take 1 tablet (145 mg total) by mouth daily. What changed: when to take this   hydrochlorothiazide 25 MG tablet Commonly known as: HYDRODIURIL TAKE 1 TABLET(25 MG) BY MOUTH DAILY FOR HIGH BLOOD PRESSURE   hydrOXYzine 25 MG tablet Commonly known as: ATARAX/VISTARIL Take 1 tablet (25 mg total) by mouth 3 (three) times daily as needed.   hyoscyamine 0.125 MG SL tablet Commonly known as: LEVSIN SL Place 1 tablet (0.125 mg total) under the tongue every 4 (four) hours as needed.   levETIRAcetam 500 MG tablet Commonly known as: KEPPRA TAKE 1 TABLET(500 MG) BY MOUTH TWICE DAILY What changed:   how much to take  how to take this  when to take this   levocetirizine 5 MG  tablet Commonly known as: Xyzal Take 1 tablet (5 mg total) by mouth every evening.   levothyroxine 175 MCG tablet Commonly known as: SYNTHROID Take 1 tablet (175 mcg total) by mouth daily before breakfast.   metFORMIN 500 MG tablet Commonly known as: GLUCOPHAGE Take 1 tablet (500 mg total) by mouth daily with breakfast.   oxyCODONE 5 MG immediate release tablet Commonly known as: Oxy IR/ROXICODONE Take 1-2 tablets (5-10 mg total) by mouth every 4 (four) hours as needed for severe pain. Not to exceed 6 tablets a day.   pregabalin 75 MG capsule Commonly known as: LYRICA Take one 75 mg capsule by mouth three times a day for two weeks. Then take one 75 mg capsule twice a day for two weeks. Then resume one 25 mg capsule twice a day. What changed:   medication strength  how much to take  how to take this  when to take this  additional instructions   Sarna lotion Generic drug: camphor-menthol Apply 1 application topically as needed for itching.   Symtuza 800-150-200-10 MG Tabs Generic drug: Darunavir-Cobicisctat-Emtricitabine-Tenofovir Alafenamide Take 1 tablet by mouth daily with breakfast.   traMADol 50 MG tablet Commonly known as: ULTRAM Take 1 tablet (50 mg total) by mouth every 8 (eight) hours as needed for moderate pain or severe pain.            Discharge Care Instructions  (From admission, onward)         Start     Ordered   11/22/20 0000  Weight bearing as tolerated        11/22/20 0748   11/22/20 0000  Change dressing       Comments: You may remove the bulky bandage (ACE wrap and gauze) two days after surgery. You will have an adhesive waterproof bandage underneath. Leave this in place until your first follow-up appointment.   11/22/20 3664  Follow-up Information    Gaynelle Arabian, MD. Schedule an appointment as soon as possible for a visit in 2 week(s).   Specialty: Orthopedic Surgery Contact information: [address] [place]  [place] [address] [phone]               Signed: Theresa Duty, PA-C Orthopedic Surgery 11/28/2020, 9:28 AM

## 2020-11-28 NOTE — Telephone Encounter (Signed)
Medication refilled

## 2020-11-30 ENCOUNTER — Ambulatory Visit: Payer: Medicaid Other

## 2020-11-30 ENCOUNTER — Other Ambulatory Visit: Payer: Self-pay

## 2020-11-30 DIAGNOSIS — M25562 Pain in left knee: Secondary | ICD-10-CM

## 2020-11-30 DIAGNOSIS — R600 Localized edema: Secondary | ICD-10-CM

## 2020-11-30 DIAGNOSIS — R2689 Other abnormalities of gait and mobility: Secondary | ICD-10-CM

## 2020-11-30 DIAGNOSIS — M6281 Muscle weakness (generalized): Secondary | ICD-10-CM

## 2020-11-30 DIAGNOSIS — M25662 Stiffness of left knee, not elsewhere classified: Secondary | ICD-10-CM
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
## 2020-12-05 ENCOUNTER — Encounter: Payer: Medicaid Other | Admitting: Physical Therapy

## 2020-12-06 ENCOUNTER — Ambulatory Visit: Payer: Medicaid Other

## 2020-12-06 ENCOUNTER — Other Ambulatory Visit: Payer: Self-pay

## 2020-12-06 DIAGNOSIS — M25662 Stiffness of left knee, not elsewhere classified: Secondary | ICD-10-CM

## 2020-12-06 DIAGNOSIS — M6281 Muscle weakness (generalized): Secondary | ICD-10-CM

## 2020-12-06 DIAGNOSIS — R2689 Other abnormalities of gait and mobility: Secondary | ICD-10-CM

## 2020-12-06 DIAGNOSIS — M25562 Pain in left knee: Secondary | ICD-10-CM

## 2020-12-06 DIAGNOSIS — R600 Localized edema: Secondary | ICD-10-CM
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
## 2020-12-08 ENCOUNTER — Ambulatory Visit: Payer: Medicaid Other

## 2020-12-09 ENCOUNTER — Ambulatory Visit: Payer: Medicaid Other

## 2020-12-13 ENCOUNTER — Encounter: Payer: Medicaid Other | Admitting: Physical Therapy

## 2020-12-14 ENCOUNTER — Ambulatory Visit: Payer: Medicaid Other | Attending: Student | Admitting: Physical Therapy

## 2020-12-14 ENCOUNTER — Encounter: Payer: Self-pay | Admitting: Physical Therapy

## 2020-12-14 ENCOUNTER — Other Ambulatory Visit: Payer: Self-pay

## 2020-12-14 DIAGNOSIS — M25662 Stiffness of left knee, not elsewhere classified: Secondary | ICD-10-CM | POA: Insufficient documentation

## 2020-12-14 DIAGNOSIS — M6281 Muscle weakness (generalized): Secondary | ICD-10-CM | POA: Diagnosis present

## 2020-12-14 DIAGNOSIS — R2681 Unsteadiness on feet: Secondary | ICD-10-CM | POA: Diagnosis present

## 2020-12-14 DIAGNOSIS — R600 Localized edema: Secondary | ICD-10-CM | POA: Diagnosis present

## 2020-12-14 DIAGNOSIS — M25562 Pain in left knee: Secondary | ICD-10-CM | POA: Diagnosis not present

## 2020-12-14 DIAGNOSIS — R2689 Other abnormalities of gait and mobility: Secondary | ICD-10-CM | POA: Diagnosis present

## 2020-12-14 DIAGNOSIS — R296 Repeated falls: Secondary | ICD-10-CM | POA: Diagnosis present
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
## 2020-12-19 ENCOUNTER — Ambulatory Visit: Payer: Medicaid Other

## 2020-12-19 ENCOUNTER — Other Ambulatory Visit: Payer: Self-pay

## 2020-12-19 DIAGNOSIS — M25562 Pain in left knee: Secondary | ICD-10-CM

## 2020-12-19 DIAGNOSIS — R2689 Other abnormalities of gait and mobility: Secondary | ICD-10-CM

## 2020-12-19 DIAGNOSIS — R600 Localized edema: Secondary | ICD-10-CM

## 2020-12-19 DIAGNOSIS — R296 Repeated falls: Secondary | ICD-10-CM

## 2020-12-19 DIAGNOSIS — M6281 Muscle weakness (generalized): Secondary | ICD-10-CM

## 2020-12-19 DIAGNOSIS — M25662 Stiffness of left knee, not elsewhere classified: Secondary | ICD-10-CM
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
## 2020-12-21 ENCOUNTER — Encounter: Payer: Medicaid Other | Admitting: Physical Therapy

## 2020-12-22 ENCOUNTER — Encounter: Payer: Medicaid Other | Admitting: Physical Therapy

## 2020-12-26 ENCOUNTER — Ambulatory Visit: Payer: Medicaid Other | Admitting: Physical Therapy

## 2020-12-26 ENCOUNTER — Other Ambulatory Visit: Payer: Self-pay

## 2020-12-26 ENCOUNTER — Encounter: Payer: Self-pay | Admitting: Physical Therapy

## 2020-12-26 DIAGNOSIS — M25562 Pain in left knee: Secondary | ICD-10-CM

## 2020-12-26 DIAGNOSIS — M25662 Stiffness of left knee, not elsewhere classified: Secondary | ICD-10-CM

## 2020-12-26 DIAGNOSIS — R600 Localized edema: Secondary | ICD-10-CM

## 2020-12-26 DIAGNOSIS — R2681 Unsteadiness on feet: Secondary | ICD-10-CM

## 2020-12-26 DIAGNOSIS — M6281 Muscle weakness (generalized): Secondary | ICD-10-CM

## 2020-12-26 DIAGNOSIS — R2689 Other abnormalities of gait and mobility: Secondary | ICD-10-CM
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
## 2020-12-29 ENCOUNTER — Encounter: Payer: Medicaid Other | Admitting: Physical Therapy

## 2021-01-02 ENCOUNTER — Ambulatory Visit: Payer: Medicaid Other | Admitting: Physical Therapy

## 2021-01-17 ENCOUNTER — Telehealth (INDEPENDENT_AMBULATORY_CARE_PROVIDER_SITE_OTHER): Payer: Medicaid Other | Admitting: Family Medicine

## 2021-01-17 ENCOUNTER — Other Ambulatory Visit: Payer: Self-pay

## 2021-01-17 DIAGNOSIS — M62838 Other muscle spasm: Secondary | ICD-10-CM

## 2021-01-17 DIAGNOSIS — I10 Essential (primary) hypertension: Secondary | ICD-10-CM | POA: Diagnosis not present

## 2021-01-17 DIAGNOSIS — E1165 Type 2 diabetes mellitus with hyperglycemia: Secondary | ICD-10-CM | POA: Diagnosis not present

## 2021-01-17 DIAGNOSIS — E038 Other specified hypothyroidism: Secondary | ICD-10-CM | POA: Diagnosis not present

## 2021-01-17 MED ORDER — HYDROCHLOROTHIAZIDE 25 MG PO TABS: ORAL_TABLET | ORAL | 2 refills | Status: DC

## 2021-01-17 MED ORDER — CYCLOBENZAPRINE HCL 10 MG PO TABS: 10.0000 mg | ORAL_TABLET | Freq: Three times a day (TID) | ORAL | 0 refills | Status: DC | PRN

## 2021-01-17 NOTE — Progress Notes (Signed)
Cheryl Thompson are scheduled for a virtual visit with your provider today.    Just as we do with appointments in the office, we must obtain your consent to participate.  Your consent will be active for this visit and any virtual visit you may have with one of our providers in the next 365 days.    If you have a MyChart account, I can also send a copy of this consent to you electronically.  All virtual visits are billed to your insurance company just like a traditional visit in the office.  As this is a virtual visit, video technology does not allow for your provider to perform a traditional examination.  This may limit your provider's ability to fully assess your condition.  If your provider identifies any concerns that need to be evaluated in person or the need to arrange testing such as labs, EKG, etc, we will make arrangements to do so.    Although advances in technology are sophisticated, we cannot ensure that it will always work on either your end or our end.  If the connection with a video visit is poor, we may have to switch to a telephone visit.  With either a video or telephone visit, we are not always able to ensure that we have a secure connection.   I need to obtain your verbal consent now.   Are you willing to proceed with your visit today?   Cheryl Thompson has provided verbal consent on 01/17/2021 for a virtual visit (video or telephone). Virtual Visit via Video Note  I connected with Cheryl Thompson on 01/17/21 at  2:00 PM EDT by a video enabled telemedicine application and verified that I am speaking with the correct person using two identifiers.  Location: Patient: Home Provider: [place]   I discussed the limitations of evaluation and management by telemedicine and the availability of in person appointments. The patient expressed understanding and agreed to proceed.  History of Present Illness: Cheryl Thompson is a very pleasant 55 year old female with a  past medical history significant for hypothyroidism, hypertension, obesity, depression, HIV, hepatitis C, type 2 diabetes, and history of seizure disorders presents via video for follow-up of chronic conditions.  Patient is also requesting medication refills today.  She says that she is doing well and has minimal complaints on today.  Patient is status post left knee replacement.  Prior to knee replacement, patient was having frequent falls that have continued.  She is followed by neurology.  She states that legs periodically "give out".  Patient was last seen in the emergency department on 12/20/2020 following a fall.  During the fall, patient sustained injuries, she was in the laundry room and fell for no explained reason.  She says that she has been having frequent falls for about a year and a half.  No known cause for falls at this time. Patient says that she has been taking all medications consistently and without interruption.  She has been following up with her specialist consistently.  Patient is HIV disease continues to be well controlled. Patient has a history of type 2 diabetes mellitus.  Lately, patient has been following a low-fat, low carbohydrate diet divided over small meals.  She has had a significant decrease in her weight.  Patient states that she has been following this diet for several months.  She started prior to knee replacement. She denies polyuria, polydipsia, polyphagia, blurry vision, headaches, nausea, shortness of breath, or  urinary symptoms today   Past Medical History:  Diagnosis Date   Acute alcoholic hepatitis    Alcoholism ([place])    Anxiety    Arthritis    Colon polyps    Depression    Diabetes ([place])    borderline on Metformin    Diverticulitis y-1   Diverticulosis y-1   Hepatitis C    Hiatal hernia    HIV (human immunodeficiency virus infection) ([place])    HLD (hyperlipidemia)    Hypertension    Hypothyroidism    Pneumonia    as a teenager    Seizures ([place])     Stroke ([place])    weakness on left side     Past Surgical History:  Procedure Laterality Date   CHOLECYSTECTOMY     dental     right knee surgery     around 55 years old, for ? chronic dislocation   TOTAL ABDOMINAL HYSTERECTOMY W/ BILATERAL SALPINGOOPHORECTOMY  2006   TOTAL KNEE ARTHROPLASTY Left 11/21/2020   Procedure: TOTAL KNEE ARTHROPLASTY, RIGHT CORTISONE INJECTIONS;  Surgeon: Gaynelle Arabian, MD;  Location: [facility];  Service: Orthopedics;  Laterality: Left;  14min    Allergies  Allergen Reactions   Penicillins Anaphylaxis   Naproxen Hives, Itching and Rash    Orange tablet=itching   Codeine    Doxycycline Hives    blisters   Morphine And Related     Recovering narcotic user-prefers no narcs   Other    Penicillin G    Statins Nausea And Vomiting   Biktarvy [Bictegravir-Emtricitab-Tenofov] Rash    Immunization History  Administered Date(s) Administered   Hepatitis B 04/20/2010, 06/27/2010   Hepatitis B, adult 04/20/2010, 06/27/2010   Influenza Whole 03/28/2010   Influenza,inj,Quad PF,6+ Mos 03/16/2014, 05/03/2016, 04/16/2017, 04/10/2018, 03/30/2019, 04/25/2020   Influenza-Unspecified 03/24/2015, 04/16/2017   Moderna Sars-Covid-2 Vaccination 10/18/2019, 11/15/2019   Pneumococcal Conjugate-13 03/05/2018   Pneumococcal Polysaccharide-23 03/28/2010, 02/22/2015   Td 02/14/2010    Assessment and Plan: 1. Essential hypertension, benign - hydrochlorothiazide (HYDRODIURIL) 25 MG tablet; TAKE 1 TABLET(25 MG) BY MOUTH DAILY FOR HIGH BLOOD PRESSURE  Dispense: 90 tablet; Refill: 2  2. Muscle spasm - cyclobenzaprine (FLEXERIL) 10 MG tablet; Take 1 tablet (10 mg total) by mouth 3 (three) times daily as needed for muscle spasms.  Dispense: 90 tablet; Refill: 0  3. Other specified hypothyroidism  - Thyroid Panel With TSH; Future  4. Type 2 diabetes mellitus with hyperglycemia, without long-term current use of insulin (HCC)  - Hemoglobin A1c; Future   Follow Up  Instructions:  Patient will follow-up in office next week for labs.  Also, will schedule an in person visit in 3 months. I discussed the assessment and treatment plan with the patient. The patient was provided an opportunity to ask questions and all were answered. The patient agreed with the plan and demonstrated an understanding of the instructions.   The patient was advised to call back or seek an in-person evaluation if the symptoms worsen or if the condition fails to improve as anticipated.  I provided 10 minutes of non-face-to-face time during this encounter.   [name]  APRN, MSN, FNP-C [facility] [address] [place], [place] [address] [phone]   Cammie Sickle, [place] 01/17/2021  1:48 PM

## 2021-01-25 ENCOUNTER — Other Ambulatory Visit: Payer: Self-pay

## 2021-01-25 ENCOUNTER — Other Ambulatory Visit: Payer: Medicaid Other

## 2021-01-25 DIAGNOSIS — E1165 Type 2 diabetes mellitus with hyperglycemia: Secondary | ICD-10-CM

## 2021-01-25 DIAGNOSIS — E038 Other specified hypothyroidism: Secondary | ICD-10-CM

## 2021-01-25 NOTE — Progress Notes (Signed)
Patient in for labs

## 2021-01-26 LAB — THYROID PANEL WITH TSH
Free Thyroxine Index: 1.7 (ref 1.2–4.9)
T3 Uptake Ratio: 22 % — ABNORMAL LOW (ref 24–39)
T4, Total: 7.8 ug/dL (ref 4.5–12.0)
TSH: 2.86 u[IU]/mL (ref 0.450–4.500)

## 2021-01-26 LAB — HEMOGLOBIN A1C
Est. average glucose Bld gHb Est-mCnc: 126 mg/dL
Hgb A1c MFr Bld: 6 % — ABNORMAL HIGH (ref 4.8–5.6)

## 2021-01-30 ENCOUNTER — Encounter: Payer: Self-pay | Admitting: Family Medicine
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
## 2021-02-03 ENCOUNTER — Other Ambulatory Visit: Payer: Self-pay

## 2021-02-03 ENCOUNTER — Encounter: Payer: Self-pay | Admitting: Neurology

## 2021-02-03 ENCOUNTER — Ambulatory Visit: Payer: Medicaid Other | Admitting: Neurology

## 2021-02-03 VITALS — BP 130/88 | HR 99 | Ht 67.0 in | Wt 213.2 lb

## 2021-02-03 DIAGNOSIS — G959 Disease of spinal cord, unspecified: Secondary | ICD-10-CM

## 2021-02-03 DIAGNOSIS — R2689 Other abnormalities of gait and mobility: Secondary | ICD-10-CM

## 2021-02-03 DIAGNOSIS — R200 Anesthesia of skin: Secondary | ICD-10-CM

## 2021-02-03 DIAGNOSIS — R296 Repeated falls: Secondary | ICD-10-CM | POA: Diagnosis not present

## 2021-02-03 NOTE — Patient Instructions (Signed)
We will check MRI of cervical spine to look for any significant spinal cord injury

## 2021-02-07 ENCOUNTER — Telehealth: Payer: Self-pay | Admitting: Family Medicine

## 2021-02-07 NOTE — Telephone Encounter (Signed)
Patient notified of results, verbally understood no additional questions. Appointment was scheduled for 10/25.

## 2021-02-07 NOTE — Telephone Encounter (Signed)
Cheryl Thompson is a very pleasant 55 year old female that presented for labs on 01/25/2021.  Thyroid panel reviewed, TSH within normal range.  No medication changes are warranted at this time.  Hemoglobin A1c remains stable at 6.0, which is consistent with prediabetes.  Will continue current medication regimen.  Also, remind patient of the importance of continuing to follow her low-carb, low-fat diet.  Have patient follow-up in 3 months.   [name]  APRN, MSN, FNP-C [facility] [address] [place], [place] [address] [phone]

## 2021-02-08 ENCOUNTER — Ambulatory Visit: Payer: Medicaid Other | Attending: Orthopedic Surgery | Admitting: Physical Therapy

## 2021-02-27 ENCOUNTER — Ambulatory Visit: Payer: Medicaid Other | Admitting: Physical Therapy

## 2021-03-06 ENCOUNTER — Encounter: Payer: Self-pay | Admitting: Physical Therapy

## 2021-03-06 ENCOUNTER — Other Ambulatory Visit: Payer: Self-pay

## 2021-03-06 ENCOUNTER — Ambulatory Visit: Payer: Medicaid Other | Attending: Orthopedic Surgery | Admitting: Physical Therapy

## 2021-03-06 DIAGNOSIS — M545 Low back pain, unspecified: Secondary | ICD-10-CM | POA: Diagnosis present

## 2021-03-06 DIAGNOSIS — R2689 Other abnormalities of gait and mobility: Secondary | ICD-10-CM | POA: Diagnosis present

## 2021-03-06 DIAGNOSIS — M6281 Muscle weakness (generalized): Secondary | ICD-10-CM | POA: Diagnosis present

## 2021-03-06 DIAGNOSIS — R296 Repeated falls: Secondary | ICD-10-CM | POA: Diagnosis present

## 2021-03-06 DIAGNOSIS — G8929 Other chronic pain: Secondary | ICD-10-CM | POA: Diagnosis present

## 2021-03-06 DIAGNOSIS — R2681 Unsteadiness on feet: Secondary | ICD-10-CM | POA: Diagnosis present

## 2021-03-06 DIAGNOSIS — M25562 Pain in left knee: Secondary | ICD-10-CM | POA: Insufficient documentation

## 2021-03-06 NOTE — Patient Instructions (Signed)
Access Code: [email or web address] URL: [email or web address] Date: 03/06/2021 Prepared by: Glenetta Hew  Exercises Supine Lower Trunk Rotation - 1 x daily - 7 x weekly - 2 sets - 10 reps Supine Bridge - 1 x daily - 7 x weekly - 2 sets - 10 reps Beginner Prone Single Leg Raise - 1 x daily - 7 x weekly - 3 sets - 10 reps
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
## 2021-03-14 ENCOUNTER — Other Ambulatory Visit: Payer: Self-pay | Admitting: Family Medicine

## 2021-03-14 DIAGNOSIS — G629 Polyneuropathy, unspecified: Secondary | ICD-10-CM

## 2021-03-14 DIAGNOSIS — E1165 Type 2 diabetes mellitus with hyperglycemia: Secondary | ICD-10-CM

## 2021-03-14 DIAGNOSIS — B20 Human immunodeficiency virus [HIV] disease: Secondary | ICD-10-CM

## 2021-03-14 MED ORDER — METFORMIN HCL 500 MG PO TABS
500.0000 mg | ORAL_TABLET | Freq: Two times a day (BID) | ORAL | 3 refills | Status: DC
Start: 2021-03-14 — End: 2022-03-29

## 2021-03-14 MED ORDER — DULOXETINE HCL 60 MG PO CPEP: 60.0000 mg | ORAL_CAPSULE | Freq: Every day | ORAL | 3 refills | Status: DC

## 2021-03-14 MED ORDER — PREGABALIN 25 MG PO CAPS: ORAL_CAPSULE | ORAL | 1 refills | Status: DC

## 2021-03-14 NOTE — Progress Notes (Signed)
Meds ordered this encounter  Medications   DULoxetine (CYMBALTA) 60 MG capsule    Sig: Take 1 capsule (60 mg total) by mouth daily.    Dispense:  90 capsule    Refill:  3    Order Specific Question:   Supervising Provider    Answer:   Tresa Garter [id number]   pregabalin (LYRICA) 25 MG capsule    Sig: Take  25 mg capsule twice a day.    Dispense:  180 capsule    Refill:  1    Order Specific Question:   Supervising Provider    Answer:   Tresa Garter [id number]   metFORMIN (GLUCOPHAGE) 500 MG tablet    Sig: Take 1 tablet (500 mg total) by mouth 2 (two) times daily with a meal.    Dispense:  180 tablet    Refill:  3    Order Specific Question:   Supervising Provider    Answer:   Tresa Garter [id number]     [name]  APRN, MSN, FNP-C [facility] [address] [place], [place] [address] [phone]

## 2021-03-16 ENCOUNTER — Other Ambulatory Visit: Payer: Self-pay

## 2021-03-16 ENCOUNTER — Ambulatory Visit: Payer: Medicaid Other | Attending: Orthopedic Surgery

## 2021-03-16 DIAGNOSIS — R2689 Other abnormalities of gait and mobility: Secondary | ICD-10-CM | POA: Insufficient documentation

## 2021-03-16 DIAGNOSIS — R2681 Unsteadiness on feet: Secondary | ICD-10-CM | POA: Diagnosis present

## 2021-03-16 DIAGNOSIS — M545 Low back pain, unspecified: Secondary | ICD-10-CM | POA: Diagnosis present

## 2021-03-16 DIAGNOSIS — M25562 Pain in left knee: Secondary | ICD-10-CM | POA: Insufficient documentation

## 2021-03-16 DIAGNOSIS — G8929 Other chronic pain: Secondary | ICD-10-CM | POA: Diagnosis present

## 2021-03-16 DIAGNOSIS — M25561 Pain in right knee: Secondary | ICD-10-CM | POA: Diagnosis present

## 2021-03-16 DIAGNOSIS — R296 Repeated falls: Secondary | ICD-10-CM | POA: Insufficient documentation

## 2021-03-16 DIAGNOSIS — M6281 Muscle weakness (generalized): Secondary | ICD-10-CM | POA: Diagnosis present
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
## 2021-03-23 ENCOUNTER — Other Ambulatory Visit: Payer: Self-pay

## 2021-03-23 ENCOUNTER — Ambulatory Visit: Payer: Medicaid Other | Admitting: Physical Therapy

## 2021-03-23 DIAGNOSIS — M6281 Muscle weakness (generalized): Secondary | ICD-10-CM

## 2021-03-23 DIAGNOSIS — M25562 Pain in left knee: Secondary | ICD-10-CM | POA: Diagnosis not present

## 2021-03-23 DIAGNOSIS — R296 Repeated falls: Secondary | ICD-10-CM

## 2021-03-23 DIAGNOSIS — G8929 Other chronic pain: Secondary | ICD-10-CM

## 2021-03-23 DIAGNOSIS — R2689 Other abnormalities of gait and mobility: Secondary | ICD-10-CM

## 2021-03-23 DIAGNOSIS — M545 Low back pain, unspecified: Secondary | ICD-10-CM

## 2021-03-23 DIAGNOSIS — R2681 Unsteadiness on feet: Secondary | ICD-10-CM

## 2021-03-23 NOTE — Patient Instructions (Signed)
Access Code: [email or web address] URL: [email or web address] Date: 03/23/2021 Prepared by: Glenetta Hew  Exercises Tandem Stance with Support - 1 x daily - 7 x weekly - 3 sets - 10 reps Standing Balance in Corner - 1 x daily - 7 x weekly - 3 sets - 10 reps Standing Balance in Corner with Eyes Closed - 1 x daily - 7 x weekly - 3 sets - 10 reps
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
## 2021-03-24 ENCOUNTER — Ambulatory Visit: Payer: Medicaid Other

## 2021-03-28 ENCOUNTER — Ambulatory Visit: Payer: Medicaid Other | Admitting: Physical Therapy

## 2021-03-31 ENCOUNTER — Ambulatory Visit: Payer: Medicaid Other

## 2021-04-04 ENCOUNTER — Ambulatory Visit: Payer: Medicaid Other | Admitting: Physical Therapy

## 2021-04-04 ENCOUNTER — Other Ambulatory Visit: Payer: Self-pay

## 2021-04-04 ENCOUNTER — Encounter: Payer: Self-pay | Admitting: Physical Therapy

## 2021-04-04 DIAGNOSIS — M545 Low back pain, unspecified: Secondary | ICD-10-CM

## 2021-04-04 DIAGNOSIS — M6281 Muscle weakness (generalized): Secondary | ICD-10-CM

## 2021-04-04 DIAGNOSIS — M25561 Pain in right knee: Secondary | ICD-10-CM

## 2021-04-04 DIAGNOSIS — R2689 Other abnormalities of gait and mobility: Secondary | ICD-10-CM

## 2021-04-04 DIAGNOSIS — R2681 Unsteadiness on feet: Secondary | ICD-10-CM

## 2021-04-04 DIAGNOSIS — G8929 Other chronic pain: Secondary | ICD-10-CM

## 2021-04-04 DIAGNOSIS — M25562 Pain in left knee: Secondary | ICD-10-CM

## 2021-04-04 DIAGNOSIS — R296 Repeated falls: Secondary | ICD-10-CM
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
## 2021-04-13 ENCOUNTER — Telehealth: Payer: Self-pay

## 2021-04-13 ENCOUNTER — Other Ambulatory Visit: Payer: Self-pay | Admitting: Family Medicine

## 2021-04-13 NOTE — Telephone Encounter (Signed)
Ibuprofen

## 2021-04-17 ENCOUNTER — Other Ambulatory Visit: Payer: Self-pay | Admitting: Family Medicine

## 2021-04-17 MED ORDER — IBUPROFEN 600 MG PO TABS: 600.0000 mg | ORAL_TABLET | Freq: Three times a day (TID) | ORAL | 0 refills | Status: DC | PRN

## 2021-04-18 ENCOUNTER — Ambulatory Visit: Payer: Medicaid Other

## 2021-04-20 MED ORDER — IBUPROFEN 600 MG PO TABS: 600.0000 mg | ORAL_TABLET | Freq: Three times a day (TID) | ORAL | 0 refills | Status: DC | PRN

## 2021-04-24 ENCOUNTER — Ambulatory Visit: Payer: Medicaid Other | Admitting: Internal Medicine

## 2021-04-25 ENCOUNTER — Ambulatory Visit: Payer: Medicaid Other | Admitting: Physical Therapy

## 2021-05-02 ENCOUNTER — Other Ambulatory Visit: Payer: Self-pay

## 2021-05-02 ENCOUNTER — Encounter: Payer: Self-pay | Admitting: Internal Medicine

## 2021-05-02 ENCOUNTER — Ambulatory Visit (INDEPENDENT_AMBULATORY_CARE_PROVIDER_SITE_OTHER): Payer: Medicaid Other | Admitting: Internal Medicine

## 2021-05-02 ENCOUNTER — Encounter: Payer: Self-pay | Admitting: Physical Therapy

## 2021-05-02 ENCOUNTER — Ambulatory Visit: Payer: Medicaid Other | Attending: Orthopedic Surgery | Admitting: Physical Therapy

## 2021-05-02 VITALS — BP 122/74 | HR 100 | Temp 97.4°F | Resp 16 | Wt 215.0 lb

## 2021-05-02 DIAGNOSIS — Z5181 Encounter for therapeutic drug level monitoring: Secondary | ICD-10-CM

## 2021-05-02 DIAGNOSIS — G8929 Other chronic pain: Secondary | ICD-10-CM | POA: Insufficient documentation

## 2021-05-02 DIAGNOSIS — M6281 Muscle weakness (generalized): Secondary | ICD-10-CM | POA: Insufficient documentation

## 2021-05-02 DIAGNOSIS — M545 Low back pain, unspecified: Secondary | ICD-10-CM | POA: Insufficient documentation

## 2021-05-02 DIAGNOSIS — M25562 Pain in left knee: Secondary | ICD-10-CM | POA: Diagnosis not present

## 2021-05-02 DIAGNOSIS — E781 Pure hyperglyceridemia: Secondary | ICD-10-CM

## 2021-05-02 DIAGNOSIS — K7460 Unspecified cirrhosis of liver: Secondary | ICD-10-CM

## 2021-05-02 DIAGNOSIS — Z21 Asymptomatic human immunodeficiency virus [HIV] infection status: Secondary | ICD-10-CM

## 2021-05-02 DIAGNOSIS — Z23 Encounter for immunization: Secondary | ICD-10-CM

## 2021-05-02 DIAGNOSIS — D696 Thrombocytopenia, unspecified: Secondary | ICD-10-CM | POA: Diagnosis not present

## 2021-05-02 DIAGNOSIS — R2681 Unsteadiness on feet: Secondary | ICD-10-CM | POA: Insufficient documentation

## 2021-05-02 DIAGNOSIS — R2689 Other abnormalities of gait and mobility: Secondary | ICD-10-CM | POA: Diagnosis present

## 2021-05-02 DIAGNOSIS — R296 Repeated falls: Secondary | ICD-10-CM | POA: Insufficient documentation

## 2021-05-02 DIAGNOSIS — Z113 Encounter for screening for infections with a predominantly sexual mode of transmission: Secondary | ICD-10-CM

## 2021-05-02 DIAGNOSIS — M25561 Pain in right knee: Secondary | ICD-10-CM | POA: Insufficient documentation
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
## 2021-05-02 NOTE — Patient Instructions (Signed)
Access Code: [email or web address] URL: [email or web address] Date: 05/02/2021 Prepared by: Glenetta Hew  Exercises Static Prone on Elbows - 1 x daily - 7 x weekly - 2 sets - 10 reps Prone Knee Flexion - 1 x daily - 7 x weekly - 2 sets - 10 reps Standing Hip Abduction with Counter Support - 1 x daily - 7 x weekly - 2 sets - 10 reps Standing Hip Extension with Counter Support - 1 x daily - 7 x weekly - 2 sets - 10 reps Mini Squat with Counter Support - 1 x daily - 7 x weekly - 2 sets - 10 reps

## 2021-05-03 ENCOUNTER — Encounter: Payer: Self-pay | Admitting: Internal Medicine

## 2021-05-03 DIAGNOSIS — Z5181 Encounter for therapeutic drug level monitoring: Secondary | ICD-10-CM | POA: Insufficient documentation

## 2021-05-03 LAB — T-HELPER CELL (CD4) - (RCID CLINIC ONLY)
CD4 % Helper T Cell: 48 % (ref 33–65)
CD4 T Cell Abs: 1312 /uL (ref 400–1790)

## 2021-05-03 NOTE — Assessment & Plan Note (Signed)
Will screen today with RPR

## 2021-05-03 NOTE — Assessment & Plan Note (Signed)
Will check her lipid panel 

## 2021-05-03 NOTE — Assessment & Plan Note (Signed)
elastography with F3/4 but no overt cirrhosis on ultrasound.  I will recheck the elastography next visit for comparison.

## 2021-05-03 NOTE — Assessment & Plan Note (Signed)
Persistent thrombocytopenia, possibly due to liver disease.  Stable and will continue to monitor

## 2021-05-03 NOTE — Assessment & Plan Note (Signed)
Creat, LFTs remain wnl

## 2021-05-03 NOTE — Progress Notes (Signed)
   Subjective:    Patient ID: Cheryl Thompson, female    DOB: 06-11-1966, 55 y.o.   MRN: [id number]  HPI Here for follow up of HIV She continues on Symtuza with no missed doses.  No issues with getting, taking  or tolerating the medication.  She has no complaints today.  She had her knee replaced recently and is continuing with rehab.  She continues to have issues with falling and follows neurology.     Review of Systems  Constitutional:  Negative for fatigue.  Gastrointestinal:  Negative for diarrhea and nausea.  Skin:  Negative for rash.      Objective:   Physical Exam Eyes:     General: No scleral icterus. Pulmonary:     Effort: Pulmonary effort is normal.  Skin:    Findings: No rash.  Neurological:     General: No focal deficit present.     Mental Status: She is alert.  Psychiatric:        Mood and Affect: Mood normal.   SH: remains sober       Assessment & Plan:

## 2021-05-03 NOTE — Assessment & Plan Note (Signed)
She continues to do well on her regimen and no concerns with her medication.  Labs done at the visit and she will follow up in 6 months unless there are concerns

## 2021-05-04 ENCOUNTER — Ambulatory Visit: Payer: Self-pay

## 2021-05-04 ENCOUNTER — Ambulatory Visit (INDEPENDENT_AMBULATORY_CARE_PROVIDER_SITE_OTHER): Payer: Medicaid Other | Admitting: Family Medicine

## 2021-05-04 VITALS — BP 120/64 | Ht 69.0 in | Wt 215.0 lb

## 2021-05-04 DIAGNOSIS — S8001XA Contusion of right knee, initial encounter: Secondary | ICD-10-CM | POA: Diagnosis not present

## 2021-05-04 DIAGNOSIS — M11261 Other chondrocalcinosis, right knee: Secondary | ICD-10-CM

## 2021-05-04 DIAGNOSIS — Z96652 Presence of left artificial knee joint: Secondary | ICD-10-CM | POA: Insufficient documentation

## 2021-05-04 LAB — LIPID PANEL
Cholesterol: 179 mg/dL (ref <200)
HDL: 24 mg/dL — ABNORMAL LOW (ref >=50)
Non-HDL Cholesterol (Calc): 155 mg/dL (calc) — ABNORMAL HIGH (ref <130)
Total CHOL/HDL Ratio: 7.5 (calc) — ABNORMAL HIGH (ref <5.0)
Triglycerides: 583 mg/dL — ABNORMAL HIGH (ref <150)

## 2021-05-04 LAB — CBC WITH DIFFERENTIAL/PLATELET
Absolute Monocytes: 555 cells/uL (ref 200–950)
Basophils Absolute: 30 cells/uL (ref 0–200)
Basophils Relative: 0.4 %
Eosinophils Absolute: 274 cells/uL (ref 15–500)
Eosinophils Relative: 3.7 %
HCT: 31.2 % — ABNORMAL LOW (ref 35.0–45.0)
Hemoglobin: 10.3 g/dL — ABNORMAL LOW (ref 11.7–15.5)
Lymphs Abs: 2457 cells/uL (ref 850–3900)
MCH: 29.4 pg (ref 27.0–33.0)
MCHC: 33 g/dL (ref 32.0–36.0)
MCV: 89.1 fL (ref 80.0–100.0)
MPV: 13.4 fL — ABNORMAL HIGH (ref 7.5–12.5)
Monocytes Relative: 7.5 %
Neutro Abs: 4085 cells/uL (ref 1500–7800)
Neutrophils Relative %: 55.2 %
Platelets: 126 10*3/uL — ABNORMAL LOW (ref 140–400)
RBC: 3.5 10*6/uL — ABNORMAL LOW (ref 3.80–5.10)
RDW: 14.5 % (ref 11.0–15.0)
Total Lymphocyte: 33.2 %
WBC: 7.4 10*3/uL (ref 3.8–10.8)

## 2021-05-04 LAB — COMPLETE METABOLIC PANEL WITH GFR
AG Ratio: 1.1 (calc) (ref 1.0–2.5)
ALT: 10 U/L (ref 6–29)
AST: 20 U/L (ref 10–35)
Albumin: 4 g/dL (ref 3.6–5.1)
Alkaline phosphatase (APISO): 78 U/L (ref 37–153)
BUN: 13 mg/dL (ref 7–25)
CO2: 30 mmol/L (ref 20–32)
Calcium: 9.3 mg/dL (ref 8.6–10.4)
Chloride: 100 mmol/L (ref 98–110)
Creat: 0.91 mg/dL (ref 0.50–1.03)
Globulin: 3.6 g/dL (calc) (ref 1.9–3.7)
Glucose, Bld: 82 mg/dL (ref 65–99)
Potassium: 3.8 mmol/L (ref 3.5–5.3)
Sodium: 135 mmol/L (ref 135–146)
Total Bilirubin: 0.3 mg/dL (ref 0.2–1.2)
Total Protein: 7.6 g/dL (ref 6.1–8.1)
eGFR: 74 mL/min/{1.73_m2} (ref >=60)

## 2021-05-04 LAB — HIV-1 RNA QUANT-NO REFLEX-BLD
HIV 1 RNA Quant: NOT DETECTED Copies/mL
HIV-1 RNA Quant, Log: NOT DETECTED Log cps/mL

## 2021-05-04 LAB — RPR: RPR Ser Ql: NONREACTIVE

## 2021-05-04 MED ORDER — DICLOFENAC SODIUM 1 % EX GEL: 4.0000 g | Freq: Four times a day (QID) | CUTANEOUS | 11 refills | Status: DC
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
## 2021-05-04 NOTE — Patient Instructions (Signed)
Good to see you Please use ice as needed  Please try voltaren  I'm getting xrays on the right knee and I will call with the results.   Please send me a message in MyChart with any questions or updates.  Please see me back in 2-3 weeks or as needed if better.   --Dr. Raeford Razor

## 2021-05-09 ENCOUNTER — Ambulatory Visit: Payer: Medicaid Other | Admitting: Family Medicine

## 2021-05-11 ENCOUNTER — Other Ambulatory Visit: Payer: Self-pay | Admitting: Family Medicine

## 2021-05-11 DIAGNOSIS — M62838 Other muscle spasm: Secondary | ICD-10-CM

## 2021-05-14 ENCOUNTER — Other Ambulatory Visit: Payer: Self-pay

## 2021-05-15 ENCOUNTER — Ambulatory Visit: Payer: Medicaid Other | Admitting: Dermatology

## 2021-05-15 ENCOUNTER — Other Ambulatory Visit: Payer: Self-pay

## 2021-05-15 DIAGNOSIS — L309 Dermatitis, unspecified: Secondary | ICD-10-CM

## 2021-05-15 DIAGNOSIS — L819 Disorder of pigmentation, unspecified: Secondary | ICD-10-CM | POA: Diagnosis not present

## 2021-05-15 DIAGNOSIS — L853 Xerosis cutis: Secondary | ICD-10-CM

## 2021-05-15 MED ORDER — TRIAMCINOLONE ACETONIDE 0.1 % EX CREA: TOPICAL_CREAM | CUTANEOUS | 1 refills | Status: DC

## 2021-05-15 NOTE — Patient Instructions (Addendum)
Start Triamcinolone 0.1% cream. Apply once a day to arms and legs 7 days/week for 2 weeks. Then decrease to 5 days/week 1 lb jar #1rf Avoid applying to face, groin, and axilla. Use as directed. Long-term use can cause thinning of the skin.  Start applying CeraVe cream once a day    If you have any questions or concerns for your doctor, please call our main line at [phone] and press option 4 to reach your doctor's medical assistant. If no one answers, please leave a voicemail as directed and we will return your call as soon as possible. Messages left after 4 pm will be answered the following business day.   You may also send [place] a message via [place]. We typically respond to MyChart messages within 1-2 business days.  For prescription refills, please ask your pharmacy to contact our office. Our fax number is [phone].  If you have an urgent issue when the clinic is closed that cannot wait until the next business day, you can page your doctor at the number below.    Please note that while we do our best to be available for urgent issues outside of office hours, we are not available 24/7.   If you have an urgent issue and are unable to reach [place], you may choose to seek medical care at your doctor's office, retail clinic, urgent care center, or emergency room.  If you have a medical emergency, please immediately call 911 or go to the emergency department.  Pager Numbers  - Dr. Nehemiah Massed: [phone]  - Dr. Laurence Ferrari: [phone]  - Dr. Nicole Kindred: [phone]  In the event of inclement weather, please call our main line at [phone] for an update on the status of any delays or closures.  Dermatology Medication Tips: Please keep the boxes that topical medications come in in order to help keep track of the instructions about where and how to use these. Pharmacies typically print the medication instructions only on the boxes and not directly on the medication tubes.   If your  medication is too expensive, please contact our office at [phone] option 4 or send [place] a message through [place].   We are unable to tell what your co-pay for medications will be in advance as this is different depending on your insurance coverage. However, we may be able to find a substitute medication at lower cost or fill out paperwork to get insurance to cover a needed medication.   If a prior authorization is required to get your medication covered by your insurance company, please allow [place] 1-2 business days to complete this process.  Drug prices often vary depending on where the prescription is filled and some pharmacies may offer cheaper prices.  The website www.goodrx.com contains coupons for medications through different pharmacies. The prices here do not account for what the cost may be with help from insurance (it may be cheaper with your insurance), but the website can give you the price if you did not use any insurance.  - You can print the associated coupon and take it with your prescription to the pharmacy.  - You may also stop by our office during regular business hours and pick up a GoodRx coupon card.  - If you need your prescription sent electronically to a different pharmacy, notify our office through [facility] or by phone at [phone] option 4.

## 2021-05-15 NOTE — Progress Notes (Signed)
   New Patient Visit  Subjective  Cheryl Thompson is a 55 y.o. female who presents for the following: Skin Problem (Pt c/o white spots on arms and legs x approx 1 mo. She states that it has been getting worse. Pt states that spots are itchy. She denies any changes in medications or soaps. Pt has been applying vasaline to areas. ).  Objective  Well appearing patient in no apparent distress; mood and affect are within normal limits.  A focused examination was performed including trunk, arms, extremeties. Relevant physical exam findings are noted in the Assessment and Plan.  trunk and extremeties Hypopigmented scaly patches on trunk and extremities, worse on posterior of legs             Assessment & Plan  Eczema, with dyschromia (Pityriasis Alba) trunk and extremeties  Atopic dermatitis (eczema) is a chronic, relapsing, pruritic condition that can significantly affect quality of life. It is often associated with allergic rhinitis and/or asthma and can require treatment with topical medications, phototherapy, or in severe cases biologic injectable medication (Dupixent; Adbry) or Oral JAK inhibitors.  Start TMC 0.1% cream. Apply qd to arms and legs 7 days/week for 2 weeks. Then decrease to 5 days/week 1 lb jar #1rf Avoid applying to face, groin, and axilla. Use as directed. Long-term use can cause thinning of the skin.  Start applying CeraVe cream qd  Consider biopsy if not improving.  Topical steroids (such as triamcinolone, fluocinolone, fluocinonide, mometasone, clobetasol, halobetasol, betamethasone, hydrocortisone) can cause thinning and lightening of the skin if they are used for too long in the same area. Your physician has selected the right strength medicine for your problem and area affected on the body. Please use your medication only as directed by your physician to prevent side effects.   triamcinolone cream (KENALOG) 0.1 % - trunk and extremeties Apply qd to arms  and legs 7 days/week for 2 weeks. Then decrease to 5 days/week  Xerosis - diffuse xerotic patches - recommend gentle, hydrating skin care - CeraVe cream qd; mild cleansers - Dove soap. - gentle skin care handout given  Return in about 3 months (around 08/15/2021) for atopic dermatitis f/u.  IHarriett Sine, CMA, am acting as scribe for Sarina Ser, MD.  Documentation: I have reviewed the above documentation for accuracy and completeness, and I agree with the above.  Sarina Ser, MD

## 2021-05-16 ENCOUNTER — Encounter: Payer: Self-pay | Admitting: Dermatology

## 2021-05-23 ENCOUNTER — Encounter: Payer: Self-pay | Admitting: Physical Therapy

## 2021-05-23 ENCOUNTER — Other Ambulatory Visit: Payer: Self-pay

## 2021-05-23 ENCOUNTER — Ambulatory Visit: Payer: Medicaid Other | Attending: Physician Assistant | Admitting: Physical Therapy

## 2021-05-23 DIAGNOSIS — R600 Localized edema: Secondary | ICD-10-CM | POA: Insufficient documentation

## 2021-05-23 DIAGNOSIS — R2681 Unsteadiness on feet: Secondary | ICD-10-CM | POA: Diagnosis present

## 2021-05-23 DIAGNOSIS — R2689 Other abnormalities of gait and mobility: Secondary | ICD-10-CM | POA: Insufficient documentation

## 2021-05-23 DIAGNOSIS — M5441 Lumbago with sciatica, right side: Secondary | ICD-10-CM | POA: Diagnosis present

## 2021-05-23 DIAGNOSIS — M25562 Pain in left knee: Secondary | ICD-10-CM | POA: Diagnosis present

## 2021-05-23 DIAGNOSIS — M25661 Stiffness of right knee, not elsewhere classified: Secondary | ICD-10-CM | POA: Diagnosis present

## 2021-05-23 DIAGNOSIS — M25662 Stiffness of left knee, not elsewhere classified: Secondary | ICD-10-CM | POA: Diagnosis present

## 2021-05-23 DIAGNOSIS — M6281 Muscle weakness (generalized): Secondary | ICD-10-CM | POA: Diagnosis present

## 2021-05-23 DIAGNOSIS — M25561 Pain in right knee: Secondary | ICD-10-CM | POA: Insufficient documentation

## 2021-05-23 DIAGNOSIS — M5442 Lumbago with sciatica, left side: Secondary | ICD-10-CM | POA: Diagnosis present

## 2021-05-23 NOTE — Patient Instructions (Signed)
   Access Code: [facility] URL: [email or web address] Date: 05/23/2021 Prepared by: Annie Paras  Exercises Seated Hamstring Stretch - 2-3 x daily - 7 x weekly - 3 reps - 30 sec hold Hooklying Single Knee to Chest Stretch with Towel - 2-3 x daily - 7 x weekly - 3 reps - 30 sec hold Supine Lower Trunk Rotation - 2-3 x daily - 7 x weekly - 5 reps - 10 sec hold Supine Hip Adduction Isometric with Ball - 2 x daily - 7 x weekly - 2 sets - 10 reps - 5 sec hold Hooklying Bilateral Isometric Clamshell - 2 x daily - 7 x weekly - 2 sets - 10 reps - 3 sec hold Bent Knee Fallouts with Alternating Legs - 2 x daily - 7 x weekly - 2 sets - 10 reps - 3 sec hold
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
## 2021-05-31 ENCOUNTER — Ambulatory Visit: Payer: Medicaid Other | Admitting: Physical Therapy

## 2021-05-31 ENCOUNTER — Encounter: Payer: Self-pay | Admitting: Physical Therapy

## 2021-05-31 ENCOUNTER — Other Ambulatory Visit: Payer: Self-pay

## 2021-05-31 DIAGNOSIS — M25561 Pain in right knee: Secondary | ICD-10-CM

## 2021-05-31 DIAGNOSIS — M25562 Pain in left knee: Secondary | ICD-10-CM

## 2021-05-31 DIAGNOSIS — R2689 Other abnormalities of gait and mobility: Secondary | ICD-10-CM

## 2021-05-31 DIAGNOSIS — M25662 Stiffness of left knee, not elsewhere classified: Secondary | ICD-10-CM

## 2021-05-31 DIAGNOSIS — M6281 Muscle weakness (generalized): Secondary | ICD-10-CM

## 2021-05-31 DIAGNOSIS — M25661 Stiffness of right knee, not elsewhere classified: Secondary | ICD-10-CM

## 2021-05-31 DIAGNOSIS — R2681 Unsteadiness on feet: Secondary | ICD-10-CM

## 2021-05-31 DIAGNOSIS — R600 Localized edema: Secondary | ICD-10-CM

## 2021-05-31 NOTE — Patient Instructions (Addendum)
      Access Code: [facility] URL: [email or web address] Date: 05/31/2021 Prepared by: Annie Paras  Exercises  Seated Long Arc Quad - 1 x daily - 7 x weekly - 2 sets - 10 reps - 3 sec hold Seated Hamstring Curl with Anchored Resistance - 1 x daily - 7 x weekly - 2 sets - 10 reps - 3 sec hold Standing Terminal Knee Extension at Wall with Ball - 1 x daily - 7 x weekly - 2 sets - 10 reps - 5 sec hold Standing Hip Abduction with Counter Support - 1 x daily - 7 x weekly - 2 sets - 10 reps - 2-3 sec hold Standing Hip Extension with Counter Support - 1 x daily - 7 x weekly - 2 sets - 10 reps - 2-3 sec hold Standing March with Counter Support - 1 x daily - 7 x weekly - 2 sets - 10 reps - 2-3 sec hold
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
## 2021-06-01 ENCOUNTER — Ambulatory Visit (INDEPENDENT_AMBULATORY_CARE_PROVIDER_SITE_OTHER): Payer: Medicaid Other | Admitting: Family Medicine

## 2021-06-01 ENCOUNTER — Encounter: Payer: Self-pay | Admitting: Family Medicine

## 2021-06-01 ENCOUNTER — Ambulatory Visit: Payer: Self-pay

## 2021-06-01 VITALS — BP 126/86 | Ht 69.0 in | Wt 212.0 lb

## 2021-06-01 DIAGNOSIS — M1711 Unilateral primary osteoarthritis, right knee: Secondary | ICD-10-CM | POA: Diagnosis not present

## 2021-06-01 MED ORDER — TRIAMCINOLONE ACETONIDE 40 MG/ML IJ SUSP
40.0000 mg | Freq: Once | INTRAMUSCULAR | Status: AC
  Administered 2021-06-01: 11:00:00 40 mg via INTRA_ARTICULAR

## 2021-06-01 NOTE — Assessment & Plan Note (Signed)
Pain has been ongoing.  Limited provement with conservative measures thus far. -Counseled on home exercise therapy and supportive care. -Injection today. -Could consider physical therapy.
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
## 2021-06-01 NOTE — Patient Instructions (Signed)
Good to see you Please use ice as needed   Please send me a message in MyChart with any questions or updates.  Please see me back in 4-6 weeks.   --Dr. Shequila Neglia  

## 2021-06-06 ENCOUNTER — Other Ambulatory Visit: Payer: Self-pay

## 2021-06-06 ENCOUNTER — Encounter: Payer: Self-pay | Admitting: Family Medicine

## 2021-06-06 ENCOUNTER — Ambulatory Visit (INDEPENDENT_AMBULATORY_CARE_PROVIDER_SITE_OTHER): Payer: Medicaid Other | Admitting: Family Medicine

## 2021-06-06 VITALS — BP 132/80 | HR 95 | Temp 97.6°F | Ht 69.0 in | Wt 213.6 lb

## 2021-06-06 DIAGNOSIS — Z23 Encounter for immunization: Secondary | ICD-10-CM | POA: Diagnosis not present

## 2021-06-06 DIAGNOSIS — Z1231 Encounter for screening mammogram for malignant neoplasm of breast: Secondary | ICD-10-CM

## 2021-06-06 DIAGNOSIS — Z9109 Other allergy status, other than to drugs and biological substances: Secondary | ICD-10-CM

## 2021-06-06 DIAGNOSIS — E039 Hypothyroidism, unspecified: Secondary | ICD-10-CM

## 2021-06-06 DIAGNOSIS — H6500 Acute serous otitis media, unspecified ear: Secondary | ICD-10-CM

## 2021-06-06 DIAGNOSIS — E1165 Type 2 diabetes mellitus with hyperglycemia: Secondary | ICD-10-CM | POA: Diagnosis not present

## 2021-06-06 DIAGNOSIS — M62838 Other muscle spasm: Secondary | ICD-10-CM

## 2021-06-06 DIAGNOSIS — J0110 Acute frontal sinusitis, unspecified: Secondary | ICD-10-CM

## 2021-06-06 DIAGNOSIS — G894 Chronic pain syndrome: Secondary | ICD-10-CM

## 2021-06-06 LAB — POCT GLYCOSYLATED HEMOGLOBIN (HGB A1C)
HbA1c POC (<> result, manual entry): 5.7 % (ref 4.0–5.6)
HbA1c, POC (controlled diabetic range): 5.7 % (ref 0.0–7.0)
HbA1c, POC (prediabetic range): 5.7 % (ref 5.7–6.4)
Hemoglobin A1C: 5.7 % — AB (ref 4.0–5.6)

## 2021-06-06 MED ORDER — FLUTICASONE PROPIONATE 50 MCG/ACT NA SUSP: 2.0000 | Freq: Every day | NASAL | 0 refills | Status: DC

## 2021-06-06 MED ORDER — IBUPROFEN 600 MG PO TABS
600.0000 mg | ORAL_TABLET | Freq: Three times a day (TID) | ORAL | 2 refills | Status: DC | PRN
Start: 2021-06-06 — End: 2022-01-11

## 2021-06-06 MED ORDER — LEVOCETIRIZINE DIHYDROCHLORIDE 5 MG PO TABS: 5.0000 mg | ORAL_TABLET | Freq: Every evening | ORAL | 11 refills | Status: DC

## 2021-06-06 MED ORDER — CYCLOBENZAPRINE HCL 10 MG PO TABS: 10.0000 mg | ORAL_TABLET | Freq: Three times a day (TID) | ORAL | 2 refills | Status: DC | PRN

## 2021-06-06 MED ORDER — AZITHROMYCIN 250 MG PO TABS: ORAL_TABLET | ORAL | 0 refills | Status: AC
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
## 2021-06-06 NOTE — Patient Instructions (Signed)
Sinusitis, Adult °Sinusitis is soreness and swelling (inflammation) of your sinuses. Sinuses are hollow spaces in the bones around your face. They are located: °Around your eyes. °In the middle of your forehead. °Behind your nose. °In your cheekbones. °Your sinuses and nasal passages are lined with a fluid called mucus. Mucus drains out of your sinuses. Swelling can trap mucus in your sinuses. This lets germs (bacteria, virus, or fungus) grow, which leads to infection. Most of the time, this condition is caused by a virus. °What are the causes? °This condition is caused by: °Allergies. °Asthma. °Germs. °Things that block your nose or sinuses. °Growths in the nose (nasal polyps). °Chemicals or irritants in the air. °Fungus (rare). °What increases the risk? °You are more likely to develop this condition if: °You have a weak body defense system (immune system). °You do a lot of swimming or diving. °You use nasal sprays too much. °You smoke. °What are the signs or symptoms? °The main symptoms of this condition are pain and a feeling of pressure around the sinuses. Other symptoms include: °Stuffy nose (congestion). °Runny nose (drainage). °Swelling and warmth in the sinuses. °Headache. °Toothache. °A cough that may get worse at night. °Mucus that collects in the throat or the back of the nose (postnasal drip). °Being unable to smell and taste. °Being very tired (fatigue). °A fever. °Sore throat. °Bad breath. °How is this diagnosed? °This condition is diagnosed based on: °Your symptoms. °Your medical history. °A physical exam. °Tests to find out if your condition is short-term (acute) or long-term (chronic). Your doctor may: °Check your nose for growths (polyps). °Check your sinuses using a tool that has a light (endoscope). °Check for allergies or germs. °Do imaging tests, such as an MRI or CT scan. °How is this treated? °Treatment for this condition depends on the cause and whether it is short-term or long-term. °If  caused by a virus, your symptoms should go away on their own within 10 days. You may be given medicines to relieve symptoms. They include: °Medicines that shrink swollen tissue in the nose. °Medicines that treat allergies (antihistamines). °A spray that treats swelling of the nostrils.  °Rinses that help get rid of thick mucus in your nose (nasal saline washes). °If caused by bacteria, your doctor may wait to see if you will get better without treatment. You may be given antibiotic medicine if you have: °A very bad infection. °A weak body defense system. °If caused by growths in the nose, you may need to have surgery. °Follow these instructions at home: °Medicines °Take, use, or apply over-the-counter and prescription medicines only as told by your doctor. These may include nasal sprays. °If you were prescribed an antibiotic medicine, take it as told by your doctor. Do not stop taking the antibiotic even if you start to feel better. °Hydrate and humidify ° °Drink enough water to keep your pee (urine) pale yellow. °Use a cool mist humidifier to keep the humidity level in your home above 50%. °Breathe in steam for 10-15 minutes, 3-4 times a day, or as told by your doctor. You can do this in the bathroom while a hot shower is running. °Try not to spend time in cool or dry air. °Rest °Rest as much as you can. °Sleep with your head raised (elevated). °Make sure you get enough sleep each night. °General instructions ° °Put a warm, moist washcloth on your face 3-4 times a day, or as often as told by your doctor. This will help with discomfort. °  Wash your hands often with soap and water. If there is no soap and water, use hand sanitizer. °Do not smoke. Avoid being around people who are smoking (secondhand smoke). °Keep all follow-up visits as told by your doctor. This is important. °Contact a doctor if: °You have a fever. °Your symptoms get worse. °Your symptoms do not get better within 10 days. °Get help right away  if: °You have a very bad headache. °You cannot stop throwing up (vomiting). °You have very bad pain or swelling around your face or eyes. °You have trouble seeing. °You feel confused. °Your neck is stiff. °You have trouble breathing. °Summary °Sinusitis is swelling of your sinuses. Sinuses are hollow spaces in the bones around your face. °This condition is caused by tissues in your nose that become inflamed or swollen. This traps germs. These can lead to infection. °If you were prescribed an antibiotic medicine, take it as told by your doctor. Do not stop taking it even if you start to feel better. °Keep all follow-up visits as told by your doctor. This is important. °This information is not intended to replace advice given to you by your health care provider. Make sure you discuss any questions you have with your health care provider. °Document Revised: 12/02/2017 Document Reviewed: 12/02/2017 °Elsevier Patient Education © 2022 [employer] ° °

## 2021-06-07 ENCOUNTER — Ambulatory Visit: Payer: Medicaid Other

## 2021-06-07 LAB — THYROID PANEL WITH TSH
Free Thyroxine Index: 2 (ref 1.2–4.9)
T3 Uptake Ratio: 25 % (ref 24–39)
T4, Total: 7.8 ug/dL (ref 4.5–12.0)
TSH: 6.18 u[IU]/mL — ABNORMAL HIGH (ref 0.450–4.500)

## 2021-06-14 ENCOUNTER — Ambulatory Visit: Payer: Medicaid Other

## 2021-06-14 ENCOUNTER — Other Ambulatory Visit: Payer: Self-pay

## 2021-06-14 DIAGNOSIS — R600 Localized edema: Secondary | ICD-10-CM

## 2021-06-14 DIAGNOSIS — R2689 Other abnormalities of gait and mobility: Secondary | ICD-10-CM

## 2021-06-14 DIAGNOSIS — M25662 Stiffness of left knee, not elsewhere classified: Secondary | ICD-10-CM

## 2021-06-14 DIAGNOSIS — R2681 Unsteadiness on feet: Secondary | ICD-10-CM

## 2021-06-14 DIAGNOSIS — M25661 Stiffness of right knee, not elsewhere classified: Secondary | ICD-10-CM

## 2021-06-14 DIAGNOSIS — M6281 Muscle weakness (generalized): Secondary | ICD-10-CM

## 2021-06-14 DIAGNOSIS — M25561 Pain in right knee: Secondary | ICD-10-CM

## 2021-06-14 DIAGNOSIS — M25562 Pain in left knee: Secondary | ICD-10-CM | POA: Diagnosis not present
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
## 2021-06-22 ENCOUNTER — Encounter: Payer: Medicaid Other | Admitting: Physical Therapy

## 2021-06-23 NOTE — Telephone Encounter (Signed)
Unable to reach patient, no option to leave vm.

## 2021-06-26 ENCOUNTER — Encounter: Payer: Medicaid Other | Admitting: Physical Therapy

## 2021-06-29 ENCOUNTER — Ambulatory Visit: Payer: Medicaid Other

## 2021-06-30 ENCOUNTER — Other Ambulatory Visit: Payer: Self-pay

## 2021-06-30 ENCOUNTER — Ambulatory Visit: Payer: Medicaid Other | Admitting: Nurse Practitioner

## 2021-07-03 ENCOUNTER — Encounter: Payer: Self-pay | Admitting: Nurse Practitioner

## 2021-07-03 ENCOUNTER — Telehealth (INDEPENDENT_AMBULATORY_CARE_PROVIDER_SITE_OTHER): Payer: Medicaid Other | Admitting: Nurse Practitioner

## 2021-07-03 ENCOUNTER — Telehealth: Payer: Medicaid Other | Admitting: Nurse Practitioner

## 2021-07-03 ENCOUNTER — Ambulatory Visit: Payer: Medicaid Other | Attending: Physician Assistant | Admitting: Physical Therapy

## 2021-07-03 DIAGNOSIS — H6500 Acute serous otitis media, unspecified ear: Secondary | ICD-10-CM | POA: Diagnosis not present

## 2021-07-03 DIAGNOSIS — J0110 Acute frontal sinusitis, unspecified: Secondary | ICD-10-CM | POA: Diagnosis not present

## 2021-07-03 DIAGNOSIS — J069 Acute upper respiratory infection, unspecified: Secondary | ICD-10-CM | POA: Diagnosis not present

## 2021-07-03 MED ORDER — LEVOFLOXACIN 750 MG PO TABS: 750.0000 mg | ORAL_TABLET | Freq: Every day | ORAL | 0 refills | Status: AC

## 2021-07-03 NOTE — Progress Notes (Signed)
Virtual Visit via Video Note  I connected with Cheryl Thompson on 07/03/21 at  1:40 PM EST by video and verified that I am speaking with the correct person using two identifiers.   I discussed the limitations, risks, security and privacy concerns of performing an evaluation and management service by video and the availability of in person appointments. I also discussed with the patient that there may be a patient responsible charge related to this service. The patient expressed understanding and agreed to proceed.  Patient home Provider Office  History of Present Illness: Cheryl Thompson, 55 y/o female who  has a past medical history of Acute alcoholic hepatitis, Alcoholism ([place]), Anxiety, Arthritis, Colon polyps, Depression, Diabetes ([place]), Diverticulitis (y-1), Diverticulosis (y-1), Hepatitis C, Hiatal hernia, HIV (human immunodeficiency virus infection) ([place]), HLD (hyperlipidemia), Hypertension, Hypothyroidism, Pneumonia, Seizures ([place]), and Stroke ([place]).  [facility] virtual visit today for URI unresponsive to treatment.   Patient states that she was previously evaluated by PCP and prescribed antibiotics and other medications with only mild improvement in symptoms. Has been compliant with treatment plan. States that she continues to have nasal congestion with drainage and a productive cough with yellow sputum. Suspects that she needs refill of antibiotics. Also endorses having some dizziness, but suspects that is related to ear infection. Denies any fever.  Denies any other complaints. Denies any fatigue, chest pain, shortness of breath, HA or dizziness. Denies any blurred vision, numbness or tingling.   Review of Systems  Constitutional:  Positive for malaise/fatigue. Negative for chills and fever.  HENT:  Positive for congestion. Negative for ear discharge, ear pain and sinus pain.   Eyes: Negative.   Respiratory:  Positive for cough and sputum production. Negative for shortness of breath and  wheezing.   Cardiovascular:  Negative for chest pain, palpitations and leg swelling.  Gastrointestinal:  Negative for abdominal pain, blood in stool, constipation, diarrhea, nausea and vomiting.  Skin: Negative.   Neurological:  Positive for dizziness. Negative for tingling, tremors, sensory change, speech change, focal weakness, seizures, loss of consciousness, weakness and headaches.  Psychiatric/Behavioral:  Negative for depression. The patient is not nervous/anxious.   All other systems reviewed and are negative.    Observations/Objective: Virtual visit no exam   Assessment and Plan: 1. Upper respiratory tract infection, unspecified type - levofloxacin (LEVAQUIN) 750 MG tablet; Take 1 tablet (750 mg total) by mouth daily for 7 days.  Dispense: 7 tablet; Refill: 0 -Informed to continue taking inhaled steroids and Flonase as prescribed -Informed to continue taking OTC medications as needed for symptoms -Discussed non pharmacological methods that may help with management of symptoms  2. Subacute frontal sinusitis - levofloxacin (LEVAQUIN) 750 MG tablet; Take 1 tablet (750 mg total) by mouth daily for 7 days.  Dispense: 7 tablet; Refill: 0  3. Non-recurrent acute serous otitis media, unspecified laterality    Follow Up Instructions: Follow up in 1 wk , if symptoms worsen or do not improve, sooner as needed    I discussed the assessment and treatment plan with the patient. The patient was provided an opportunity to ask questions and all were answered. The patient agreed with the plan and demonstrated an understanding of the instructions.   The patient was advised to call back or seek an in-person evaluation if the symptoms worsen or if the condition fails to improve as anticipated.  I provided 20 minutes of video- visit time during this encounter.   Teena Dunk, NP

## 2021-08-08 ENCOUNTER — Encounter: Payer: Self-pay | Admitting: Gastroenterology

## 2021-08-10 ENCOUNTER — Other Ambulatory Visit: Payer: Self-pay

## 2021-08-10 DIAGNOSIS — F419 Anxiety disorder, unspecified: Secondary | ICD-10-CM

## 2021-08-21 ENCOUNTER — Ambulatory Visit: Payer: Medicaid Other | Admitting: Dermatology

## 2021-08-31 ENCOUNTER — Encounter: Payer: Self-pay | Admitting: Gastroenterology

## 2021-09-06 ENCOUNTER — Encounter: Payer: Self-pay | Admitting: Family Medicine

## 2021-09-06 ENCOUNTER — Ambulatory Visit (INDEPENDENT_AMBULATORY_CARE_PROVIDER_SITE_OTHER): Payer: Medicaid Other | Admitting: Family Medicine

## 2021-09-06 VITALS — BP 160/100 | Ht 69.0 in | Wt 213.0 lb

## 2021-09-06 DIAGNOSIS — M533 Sacrococcygeal disorders, not elsewhere classified: Secondary | ICD-10-CM | POA: Diagnosis not present

## 2021-09-06 MED ORDER — BACLOFEN 10 MG PO TABS: 5.0000 mg | ORAL_TABLET | Freq: Three times a day (TID) | ORAL | 0 refills | Status: DC | PRN

## 2021-09-06 NOTE — Patient Instructions (Signed)
Good to see you Please try heat  Please try the exercises   Please send me a message in MyChart with any questions or updates.  Please see me back in 4-6 weeks.   --Dr. Raeford Razor

## 2021-09-06 NOTE — Progress Notes (Signed)
°  Cheryl Thompson - 56 y.o. female MRN [id number]  Date of birth: 01-26-1966  SUBJECTIVE:  Including CC & ROS.  No chief complaint on file.   Cheryl Thompson is a 56 y.o. female that is presenting with low back pain after a fall.  She was in her kitchen yesterday and fell onto her chest.  Now having low back pain.  No radicular pain.   Review of Systems See HPI   HISTORY: Past Medical, Surgical, Social, and Family History Reviewed & Updated per EMR.   Pertinent Historical Findings include:  Past Medical History:  Diagnosis Date   Acute alcoholic hepatitis    Alcoholism ([place])    Anxiety    Arthritis    Colon polyps    Depression    Diabetes ([place])    borderline on Metformin    Diverticulitis y-1   Diverticulosis y-1   Hepatitis C    Hiatal hernia    HIV (human immunodeficiency virus infection) ([place])    HLD (hyperlipidemia)    Hypertension    Hypothyroidism    Pneumonia    as a teenager    Seizures ([place])    Stroke ([place])    weakness on left side     Past Surgical History:  Procedure Laterality Date   CHOLECYSTECTOMY     dental     right knee surgery     around 56 years old, for ? chronic dislocation   TOTAL ABDOMINAL HYSTERECTOMY W/ BILATERAL SALPINGOOPHORECTOMY  2006   TOTAL KNEE ARTHROPLASTY Left 11/21/2020   Procedure: TOTAL KNEE ARTHROPLASTY, RIGHT CORTISONE INJECTIONS;  Surgeon: Gaynelle Arabian, MD;  Location: [facility];  Service: Orthopedics;  Laterality: Left;  30min     PHYSICAL EXAM:  VS: BP (!) 160/100 (BP Location: Left Arm, Patient Position: Sitting)    Ht 5\' 9"  (1.753 m)    Wt 213 lb (96.6 kg)    BMI 31.45 kg/m  Physical Exam Gen: NAD, alert, cooperative with exam, well-appearing MSK:  Neurovascularly intact       ASSESSMENT & PLAN:   Sacroiliac joint dysfunction of left side Acutely occurring.  Had a fall in her kitchen where she fell forward.  Having pain in multiple places but most significant over the SI joint. -Counseled on home exercise  therapy and supportive care. -Baclofen. -Could consider injection or physical therapy.

## 2021-09-06 NOTE — Assessment & Plan Note (Signed)
Acutely occurring.  Had a fall in her kitchen where she fell forward.  Having pain in multiple places but most significant over the SI joint. -Counseled on home exercise therapy and supportive care. -Baclofen. -Could consider injection or physical therapy.

## 2021-09-08 ENCOUNTER — Emergency Department (HOSPITAL_COMMUNITY): Payer: Medicare Other

## 2021-09-08 ENCOUNTER — Other Ambulatory Visit: Payer: Self-pay

## 2021-09-08 ENCOUNTER — Emergency Department (HOSPITAL_COMMUNITY)
Admission: EM | Admit: 2021-09-08 | Discharge: 2021-09-08 | Disposition: A | Discharge status: Home / Self Care | Payer: Medicare Other | Attending: Emergency Medicine | Admitting: Emergency Medicine

## 2021-09-08 ENCOUNTER — Encounter (HOSPITAL_COMMUNITY): Payer: Self-pay | Admitting: Emergency Medicine

## 2021-09-08 DIAGNOSIS — R519 Headache, unspecified: Secondary | ICD-10-CM | POA: Insufficient documentation

## 2021-09-08 DIAGNOSIS — Z79899 Other long term (current) drug therapy: Secondary | ICD-10-CM | POA: Diagnosis not present

## 2021-09-08 DIAGNOSIS — R42 Dizziness and giddiness: Secondary | ICD-10-CM | POA: Diagnosis not present

## 2021-09-08 DIAGNOSIS — M542 Cervicalgia: Secondary | ICD-10-CM | POA: Insufficient documentation

## 2021-09-08 DIAGNOSIS — W01198A Fall on same level from slipping, tripping and stumbling with subsequent striking against other object, initial encounter: Secondary | ICD-10-CM | POA: Diagnosis not present

## 2021-09-08 DIAGNOSIS — M549 Dorsalgia, unspecified: Secondary | ICD-10-CM | POA: Diagnosis present

## 2021-09-08 DIAGNOSIS — Y92000 Kitchen of unspecified non-institutional (private) residence as  the place of occurrence of the external cause: Secondary | ICD-10-CM | POA: Diagnosis not present

## 2021-09-08 DIAGNOSIS — W19XXXA Unspecified fall, initial encounter: Secondary | ICD-10-CM

## 2021-09-08 DIAGNOSIS — I10 Essential (primary) hypertension: Secondary | ICD-10-CM | POA: Insufficient documentation

## 2021-09-08 MED ORDER — LIDOCAINE 5 % EX PTCH
1.0000 | MEDICATED_PATCH | CUTANEOUS | 0 refills | Status: DC
Start: 2021-09-08 — End: 2022-02-27

## 2021-09-08 MED ORDER — LIDOCAINE 5 % EX PTCH
2.0000 | MEDICATED_PATCH | CUTANEOUS | Status: DC
  Administered 2021-09-08: 2 via TRANSDERMAL
  Filled 2021-09-08: qty 2

## 2021-09-08 NOTE — ED Triage Notes (Signed)
Patient reports falling earlier this week, reports hitting her head on the stove. Now has HA, neck and back pain and reports high blood pressure despite BP medication compliance. Reports feeling dizzy since the fall.

## 2021-09-08 NOTE — ED Provider Notes (Signed)
Accepted handoff at shift change from [place], [place]. Please see prior provider note for more detail.   Briefly: "Patient is 56 y.o. female presents with complaint of headache, dizziness, generalized neck and back pain after a fall which occurred 3 days ago.  Patient states that she was rushing through her kitchen to get a pan off the stove when she slipped and fell forwards landing on her belly and sliding across the kitchen floor.  Patient states she is already in physical therapy for pain in her knees, went to her doctor for pain in her left SI/sciatica is already scheduled after the fall, is taking baclofen without improvement in the pain she is experiencing.  Feels like her pain is causing her blood pressure to run high causing her to have a headache and dizziness.  Patient is not anticoagulated.  No other injuries, complaints, concerns."  DDX: concern for MSK injury  Plan: Plain films of spine ordered. Low concern for fracture. Low concern for intracranial abnormality. F/U on BP. Discharge pending imaging.  Physical Exam  BP (!) 159/98    Pulse 78    Temp 98.2 F (36.8 C) (Oral)    Resp 18    Ht 5\' 9"  (1.753 m)    Wt 100.7 kg    SpO2 100%    BMI 32.78 kg/m   Physical Exam Vitals and nursing note reviewed.  Constitutional:      General: She is not in acute distress.    Appearance: Normal appearance. She is well-developed. She is not ill-appearing, toxic-appearing or diaphoretic.  HENT:     Head: Normocephalic and atraumatic.     Nose: No nasal deformity.     Mouth/Throat:     Lips: Pink. No lesions.  Eyes:     General: Gaze aligned appropriately. No scleral icterus.       Right eye: No discharge.        Left eye: No discharge.     Conjunctiva/sclera: Conjunctivae normal.     Right eye: Right conjunctiva is not injected. No exudate or hemorrhage.    Left eye: Left conjunctiva is not injected. No exudate or hemorrhage. Pulmonary:     Effort: Pulmonary effort is normal. No respiratory  distress.  Musculoskeletal:     Comments: No C, T, or L spine midline ttp or stepoffs  Skin:    General: Skin is warm and dry.  Neurological:     Mental Status: She is alert and oriented to person, place, and time.  Psychiatric:        Mood and Affect: Mood normal.        Speech: Speech normal.        Behavior: Behavior normal. Behavior is cooperative.    Procedures  Procedures  ED Course / MDM   Clinical Course as of 09/08/21 1852  Fri Sep 08, 2021  1350 BP 159/98 on recheck [LM]  1503 Mechanical fall. BP improving. Xrays pending. [GL]  7654 I discussed patient care with patient. She really hasn't noticed much of a difference after the Lidocaine patch, but she feels comfortable knowing that the imaging was negative. She really was more concerned about her BP which has since normalized. We have discussed following up with PCP for BP management. We also discussed supportive measures for back pain, adding tylenol/ibuprofen, switching Baclofen to Flexeril and following up with PCP regarding pain. [GL]    Clinical Course User Index [GL] Muriel Hannold, Adora Fridge, PA-C [LM] Roque Lias   Medical Decision  Making Amount and/or Complexity of Data Reviewed Radiology: ordered.  Risk Prescription drug management.   Plain film imaging is negative. I reassessed this patient and pain is about the same but we came up with a discharge plan together to try to get pain under control at home. I recommend Adding tylenol/Ibuprofen alternation, changing to flexiril, starting lidocaine patch, continuing supportive measures, and f/u with PCP.  BP is also under control on reassessment. She will f/u with PCP for medication management.        Sheila Oats 09/08/21 Randol Kern    Pattricia Boss, MD 09/11/21 [phone]

## 2021-09-08 NOTE — Discharge Instructions (Addendum)
Your X rays were negative for any acute injury. Recheck with your primary care provider.  Consider referral for physical therapy for your back pain.  If headache and dizziness persist, consider referral to concussion clinic. You can alternate tylenol and ibuprofen for pain. You can also use Lidocaine patches. You can use flexeril instead of baclofen to see if you notice any difference. Continue heating pads, stretching, and movement of back.   Your Blood pressure is within a limit that is safe. You should follow up with your PCP to make further changes to your medications.

## 2021-09-08 NOTE — ED Provider Notes (Signed)
[facility] DEPT Provider Note   CSN: [id number] Arrival date & time: 09/08/21  1137     History  Chief Complaint  Patient presents with   Fall   Headache   Back Pain    Cheryl Thompson is a 56 y.o. female.  56 year old female presents with complaint of headache, dizziness, generalized neck and back pain after a fall which occurred 3 days ago.  Patient states that she was rushing through her kitchen to get a pan off the stove when she slipped and fell forwards landing on her belly and sliding across the kitchen floor.  Patient states she is already in physical therapy for pain in her knees, went to her doctor for pain in her left SI/sciatica is already scheduled after the fall, is taking baclofen without improvement in the pain she is experiencing.  Feels like her pain is causing her blood pressure to run high causing her to have a headache and dizziness.  Patient is not anticoagulated.  No other injuries, complaints, concerns.      Home Medications Prior to Admission medications   Medication Sig Start Date End Date Taking? Authorizing Provider  lidocaine (LIDODERM) 5 % Place 1 patch onto the skin daily. Remove & Discard patch within 12 hours or as directed by MD 09/08/21  Yes Tacy Learn, PA-C  albuterol (VENTOLIN HFA) 108 (90 Base) MCG/ACT inhaler Inhale 2 puffs into the lungs every 6 (six) hours as needed for wheezing or shortness of breath. 03/05/19   Lanae Boast, FNP  azithromycin (ZITHROMAX) 250 MG tablet azithromycin 250 mg tablet  TAKE 2 TABLETS BY MOUTH ON DAY 1, AND THEN TAKE 1 TABLET BY MOUTH ONCE A DAY ON DAY 2 THROUGH DAY 5    [provider]  baclofen (LIORESAL) 10 MG tablet Take 0.5 tablets (5 mg total) by mouth 3 (three) times daily as needed for muscle spasms. 09/06/21   Rosemarie Ax, MD  camphor-menthol [facility]) lotion Apply 1 application topically as needed for itching. 01/12/20   [name], FNP   Darunavir-Cobicisctat-Emtricitabine-Tenofovir Alafenamide [facility]) 800-150-200-10 MG TABS Take 1 tablet by mouth daily with breakfast. 04/25/20   Comer, Okey Regal, MD  diclofenac Sodium (VOLTAREN) 1 % GEL Apply 4 g topically 4 (four) times daily. To affected joint. 05/04/21   Rosemarie Ax, MD  DULoxetine (CYMBALTA) 60 MG capsule Take 1 capsule (60 mg total) by mouth daily. 03/14/21   [name], FNP  fenofibrate (TRICOR) 145 MG tablet Take 1 tablet (145 mg total) by mouth daily. 08/10/20   [name], FNP  fluticasone (FLONASE) 50 MCG/ACT nasal spray Place 2 sprays into both nostrils daily. 06/06/21   [name], FNP  hydrochlorothiazide (HYDRODIURIL) 25 MG tablet TAKE 1 TABLET(25 MG) BY MOUTH DAILY FOR HIGH BLOOD PRESSURE 01/17/21   [name], FNP  hydrOXYzine (ATARAX/VISTARIL) 25 MG tablet Take 1 tablet (25 mg total) by mouth 3 (three) times daily as needed. 12/24/19   [name], FNP  hyoscyamine (LEVSIN SL) 0.125 MG SL tablet Place 1 tablet (0.125 mg total) under the tongue every 4 (four) hours as needed. 12/09/19   Armbruster, Carlota Raspberry, MD  ibuprofen (ADVIL) 600 MG tablet Take 1 tablet (600 mg total) by mouth every 8 (eight) hours as needed. 06/06/21   [name], FNP  levETIRAcetam (KEPPRA) 500 MG tablet TAKE 1 TABLET(500 MG) BY MOUTH TWICE DAILY Patient taking differently: Take 500 mg by mouth 2 (two)  times daily. TAKE 1 TABLET(500 MG) BY MOUTH TWICE DAILY 09/07/20   [name], FNP  levocetirizine (XYZAL) 5 MG tablet Take 1 tablet (5 mg total) by mouth every evening. 06/06/21   [name], FNP  levothyroxine (SYNTHROID) 175 MCG tablet Take 1 tablet (175 mcg total) by mouth daily before breakfast. 11/28/20   [name], FNP  Menthol, Topical Analgesic, (BENGAY EX) Apply 1 application topically daily as needed (pain).    [provider]  metFORMIN (GLUCOPHAGE) 500 MG tablet Take 1 tablet (500 mg total) by mouth 2 (two) times  daily with a meal. 03/14/21   [name], FNP  pregabalin (LYRICA) 25 MG capsule Take  25 mg capsule twice a day. 03/14/21   [name], FNP  SEROQUEL 50 MG tablet Take 50 mg by mouth at bedtime. 12/29/20   [provider]  triamcinolone cream (KENALOG) 0.1 % Apply qd to arms and legs 7 days/week for 2 weeks. Then decrease to 5 days/week 05/15/21   Ralene Bathe, MD      Allergies    Penicillins, Naproxen, Codeine, Doxycycline, Morphine and related, Other, Penicillin g, Statins, and Biktarvy [bictegravir-emtricitab-tenofov]    Review of Systems   Review of Systems Negative except as per HPI Physical Exam Updated Vital Signs BP (!) 162/99 (BP Location: Left Arm)    Pulse 78    Temp 98.2 F (36.8 C) (Oral)    Resp 18    Ht 5\' 9"  (1.753 m)    Wt 100.7 kg    SpO2 100%    BMI 32.78 kg/m  Physical Exam Vitals and nursing note reviewed.  Constitutional:      General: She is not in acute distress.    Appearance: She is well-developed. She is not diaphoretic.  HENT:     Head: Normocephalic and atraumatic.  Cardiovascular:     Rate and Rhythm: Normal rate and regular rhythm.     Heart sounds: Normal heart sounds.  Pulmonary:     Effort: Pulmonary effort is normal.     Breath sounds: Normal breath sounds.  Musculoskeletal:     Comments: Generalized back tenderness  Neurological:     Mental Status: She is alert and oriented to person, place, and time.     GCS: GCS eye subscore is 4. GCS verbal subscore is 5. GCS motor subscore is 6.  Psychiatric:        Behavior: Behavior normal.    ED Results / Procedures / Treatments   Labs (all labs ordered are listed, but only abnormal results are displayed) Labs Reviewed - No data to display  EKG None  Radiology No results found.  Procedures Procedures    Medications Ordered in ED Medications  lidocaine (LIDODERM) 5 % 2 patch (2 patches Transdermal Patch Applied 09/08/21 1400)    ED Course/ Medical Decision  Making/ A&P Clinical Course as of 09/08/21 1502  Fri Sep 08, 2021  1350 BP 159/98 on recheck [LM]    Clinical Course User Index [LM] Tacy Learn, PA-C                           Medical Decision Making Amount and/or Complexity of Data Reviewed Radiology: ordered.  Risk Prescription drug management.   56 year old female presents with complaint of back pain with headache and dizziness since a fall at home 3 days ago.  Not anticoagulated, no loss of consciousness with the event.  Plan is for x-rays of C-spine, T-spine, L-spine.  Likely follow-up with PCP/physical therapy. Care signed out at change of shift pending x-rays.        Final Clinical Impression(s) / ED Diagnoses Final diagnoses:  Fall, initial encounter  Acute back pain, unspecified back location, unspecified back pain laterality    Rx / DC Orders ED Discharge Orders          Ordered    lidocaine (LIDODERM) 5 %  Every 24 hours        09/08/21 1453              Tacy Learn, PA-C 09/08/21 1502    Milton Ferguson, MD 09/08/21 1721

## 2021-09-12 ENCOUNTER — Encounter: Payer: Self-pay | Admitting: Physical Therapy

## 2021-09-12 ENCOUNTER — Other Ambulatory Visit: Payer: Self-pay

## 2021-09-12 ENCOUNTER — Ambulatory Visit: Payer: Medicaid Other | Attending: Orthopedic Surgery | Admitting: Physical Therapy

## 2021-09-12 DIAGNOSIS — R2681 Unsteadiness on feet: Secondary | ICD-10-CM

## 2021-09-12 DIAGNOSIS — M6281 Muscle weakness (generalized): Secondary | ICD-10-CM

## 2021-09-12 DIAGNOSIS — G8929 Other chronic pain: Secondary | ICD-10-CM | POA: Diagnosis present

## 2021-09-12 DIAGNOSIS — M25561 Pain in right knee: Secondary | ICD-10-CM | POA: Diagnosis present

## 2021-09-12 DIAGNOSIS — M25661 Stiffness of right knee, not elsewhere classified: Secondary | ICD-10-CM | POA: Diagnosis present

## 2021-09-12 DIAGNOSIS — M25562 Pain in left knee: Secondary | ICD-10-CM

## 2021-09-12 DIAGNOSIS — M545 Low back pain, unspecified: Secondary | ICD-10-CM

## 2021-09-12 DIAGNOSIS — R600 Localized edema: Secondary | ICD-10-CM | POA: Diagnosis present

## 2021-09-12 DIAGNOSIS — R2689 Other abnormalities of gait and mobility: Secondary | ICD-10-CM | POA: Diagnosis present
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
## 2021-09-12 NOTE — Patient Instructions (Signed)
Access Code: [email or web address] URL: [email or web address] Date: 09/12/2021 Prepared by: Glenetta Hew  Exercises Seated Quad Set - 3 x daily - 7 x weekly - 1 sets - 10 reps - 5-10 sec hold Long Sitting Quad Set - 2 x daily - 7 x weekly - 1 sets - 10 reps - 5-10 sec hold Supine Short Arc Quad - 2 x daily - 7 x weekly - 2 sets - 10 reps

## 2021-09-18 ENCOUNTER — Other Ambulatory Visit: Payer: Self-pay

## 2021-09-18 ENCOUNTER — Ambulatory Visit: Payer: Medicaid Other | Attending: Orthopedic Surgery

## 2021-09-18 DIAGNOSIS — M6281 Muscle weakness (generalized): Secondary | ICD-10-CM | POA: Diagnosis present

## 2021-09-18 DIAGNOSIS — M25562 Pain in left knee: Secondary | ICD-10-CM | POA: Diagnosis present

## 2021-09-18 DIAGNOSIS — R2681 Unsteadiness on feet: Secondary | ICD-10-CM | POA: Insufficient documentation

## 2021-09-18 DIAGNOSIS — M25661 Stiffness of right knee, not elsewhere classified: Secondary | ICD-10-CM | POA: Insufficient documentation

## 2021-09-18 DIAGNOSIS — R2689 Other abnormalities of gait and mobility: Secondary | ICD-10-CM | POA: Insufficient documentation

## 2021-09-18 DIAGNOSIS — R600 Localized edema: Secondary | ICD-10-CM | POA: Insufficient documentation

## 2021-09-18 DIAGNOSIS — M25561 Pain in right knee: Secondary | ICD-10-CM | POA: Insufficient documentation

## 2021-09-18 DIAGNOSIS — M545 Low back pain, unspecified: Secondary | ICD-10-CM | POA: Diagnosis present

## 2021-09-18 DIAGNOSIS — G8929 Other chronic pain: Secondary | ICD-10-CM | POA: Diagnosis present

## 2021-09-18 NOTE — Patient Instructions (Signed)
Access Code: [email or web address] ?URL: [email or web address] ?Date: 09/18/2021 ?Prepared by: Clarene Essex ? ?Exercises ?Seated Quad Set - 3 x daily - 7 x weekly - 1 sets - 10 reps - 5-10 sec hold ?Long Sitting Quad Set - 2 x daily - 7 x weekly - 1 sets - 10 reps - 5-10 sec hold ?Supine Short Arc Quad - 2 x daily - 7 x weekly - 2 sets - 10 reps ?Sit to Stand - 1 x daily - 7 x weekly - 3 sets - 10 reps ?Active Straight Leg Raise with Quad Set - 1 x daily - 7 x weekly - 3 sets - 10 reps ? ?
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
## 2021-09-21 ENCOUNTER — Other Ambulatory Visit: Payer: Self-pay

## 2021-09-21 ENCOUNTER — Encounter: Payer: Self-pay | Admitting: Physical Therapy

## 2021-09-21 ENCOUNTER — Ambulatory Visit: Payer: Medicaid Other | Admitting: Physical Therapy

## 2021-09-21 DIAGNOSIS — M25562 Pain in left knee: Secondary | ICD-10-CM

## 2021-09-21 DIAGNOSIS — R2689 Other abnormalities of gait and mobility: Secondary | ICD-10-CM

## 2021-09-21 DIAGNOSIS — M25661 Stiffness of right knee, not elsewhere classified: Secondary | ICD-10-CM

## 2021-09-21 DIAGNOSIS — M6281 Muscle weakness (generalized): Secondary | ICD-10-CM

## 2021-09-21 DIAGNOSIS — M545 Low back pain, unspecified: Secondary | ICD-10-CM

## 2021-09-21 DIAGNOSIS — M25561 Pain in right knee: Secondary | ICD-10-CM | POA: Diagnosis not present
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
## 2021-09-25 ENCOUNTER — Ambulatory Visit: Payer: Medicaid Other

## 2021-09-25 ENCOUNTER — Other Ambulatory Visit: Payer: Self-pay

## 2021-09-25 ENCOUNTER — Other Ambulatory Visit: Payer: Self-pay | Admitting: Family Medicine

## 2021-09-25 DIAGNOSIS — R2689 Other abnormalities of gait and mobility: Secondary | ICD-10-CM

## 2021-09-25 DIAGNOSIS — M25661 Stiffness of right knee, not elsewhere classified: Secondary | ICD-10-CM

## 2021-09-25 DIAGNOSIS — M25562 Pain in left knee: Secondary | ICD-10-CM

## 2021-09-25 DIAGNOSIS — M6281 Muscle weakness (generalized): Secondary | ICD-10-CM

## 2021-09-25 DIAGNOSIS — G8929 Other chronic pain: Secondary | ICD-10-CM

## 2021-09-25 DIAGNOSIS — L299 Pruritus, unspecified: Secondary | ICD-10-CM

## 2021-09-25 DIAGNOSIS — M25561 Pain in right knee: Secondary | ICD-10-CM

## 2021-09-25 MED ORDER — HYDROXYZINE HCL 25 MG PO TABS: 25.0000 mg | ORAL_TABLET | Freq: Three times a day (TID) | ORAL | 2 refills | Status: DC | PRN
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
## 2021-09-25 NOTE — Progress Notes (Signed)
Reviewed PDMP substance reporting system prior to prescribing opiate medications. No inconsistencies noted.  ?Meds ordered this encounter  ?Medications  ? hydrOXYzine (ATARAX) 25 MG tablet  ?  Sig: Take 1 tablet (25 mg total) by mouth 3 (three) times daily as needed.  ?  Dispense:  30 tablet  ?  Refill:  2  ?  Order Specific Question:   Supervising Provider  ?  AnswerTresa Garter [[id number]]  ? [name]  APRN, MSN, FNP-C ?[facility] ?[address]  ?[place], [place] [address] ?[phone] ? ?

## 2021-09-28 ENCOUNTER — Other Ambulatory Visit: Payer: Self-pay

## 2021-09-28 ENCOUNTER — Ambulatory Visit (AMBULATORY_SURGERY_CENTER): Payer: Medicaid Other | Admitting: *Deleted

## 2021-09-28 ENCOUNTER — Ambulatory Visit: Payer: Medicaid Other | Admitting: Physical Therapy

## 2021-09-28 VITALS — Ht 69.0 in | Wt 213.0 lb

## 2021-09-28 DIAGNOSIS — Z800 Family history of malignant neoplasm of digestive organs: Secondary | ICD-10-CM

## 2021-09-28 DIAGNOSIS — Z860100 Personal history of colon polyps, unspecified: Secondary | ICD-10-CM

## 2021-09-28 MED ORDER — PEG 3350-KCL-NA BICARB-NACL 420 G PO SOLR: 4000.0000 mL | Freq: Once | ORAL | 0 refills | Status: AC

## 2021-09-28 NOTE — Progress Notes (Signed)
Patient's pre-visit was done today over the phone with the patient. Name,DOB and address verified. Patient denies any allergies to Eggs and Soy. Patient denies any problems with anesthesia/sedation. "Hard to wake up" per pt. Patient is not taking any diet pills or blood thinners. No home Oxygen.  ? ?Prep instructions sent to pt's MyChart-pt aware. Patient understands to call us back with any questions or concerns. Patient is aware of our care-partner policy and LPNPY-05 safety protocol.  ? ?EMMI education assigned to the patient for the procedure, sent to [facility].  ? ?The patient is COVID-19 vaccinated.   ?

## 2021-10-02 ENCOUNTER — Ambulatory Visit: Payer: Medicaid Other | Admitting: Physical Therapy

## 2021-10-03 ENCOUNTER — Other Ambulatory Visit: Payer: Self-pay

## 2021-10-03 ENCOUNTER — Ambulatory Visit: Payer: Medicaid Other

## 2021-10-03 DIAGNOSIS — M25561 Pain in right knee: Secondary | ICD-10-CM

## 2021-10-03 DIAGNOSIS — M25562 Pain in left knee: Secondary | ICD-10-CM

## 2021-10-03 DIAGNOSIS — R2689 Other abnormalities of gait and mobility: Secondary | ICD-10-CM

## 2021-10-03 DIAGNOSIS — M25661 Stiffness of right knee, not elsewhere classified: Secondary | ICD-10-CM

## 2021-10-03 DIAGNOSIS — M545 Low back pain, unspecified: Secondary | ICD-10-CM

## 2021-10-03 DIAGNOSIS — M6281 Muscle weakness (generalized): Secondary | ICD-10-CM

## 2021-10-03 NOTE — Therapy (Addendum)
PHYSICAL THERAPY DISCHARGE SUMMARY ? ?Visits from Start of Care: 5 ? ?Current functional level related to goals / functional outcomes: ?See note below ?  ?Remaining deficits: ?See note below ?  ?Education / Equipment: ?HEP  ?Plan: ?Patient agrees to discharge.  Patient goals were not met. Patient is being discharged due planning on having hip replacement, wants to save visits for rehab afterwards.     ? ?Rennie Natter, PT, DPT 10/17/2021 10:59AM ? ? ?[facility] ?[place] ?[place], [place], [address] ?Phone: [phone]   Fax:  [phone] ? ?Physical Therapy Treatment ? ?Patient Details  ?Name: Cheryl Thompson ?MRN: [id number] ?Date of Birth: 02/23/1966 ?Referring Provider (PT): Aluisio, Pilar Plate ? ? ?Encounter Date: 10/03/2021 ? ? PT End of Session - 10/03/21 1151   ? ? Visit Number 5   ? Number of Visits 12   ? Date for PT Re-Evaluation 10/24/21   ? Authorization Type Medicaid   ? Authorization - Visit Number 1   ? Authorization - Number of Visits 12   ? Progress Note Due on Visit 10   ? PT Start Time 1102   ? PT Stop Time 1144   ? PT Time Calculation (min) 42 min   ? Activity Tolerance Patient tolerated treatment well   ? Behavior During Therapy [facility] for tasks assessed/performed   ? ?  ?  ? ?  ? ? ?Past Medical History:  ?Diagnosis Date  ? Acute alcoholic hepatitis   ? Alcoholism ([place])   ? Anxiety   ? Arthritis   ? Colon polyps   ? Depression   ? Diabetes ([place])   ? borderline on Metformin   ? Diverticulitis 2021  ? Diverticulosis 2021  ? Hepatitis C   ? Hiatal hernia   ? HIV (human immunodeficiency virus infection) ([place])   ? HLD (hyperlipidemia)   ? Hypertension   ? Hypothyroidism   ? Pneumonia   ? as a teenager   ? Seizures ([place])   ? 2016 last seizure per pt  ? Stroke [facility]) 2018  ? weakness on left side   ? ? ?Past Surgical History:  ?Procedure Laterality Date  ? CHOLECYSTECTOMY    ? COLONOSCOPY  08/11/2018  ? [facility] polyp  ? dental    ?  right knee surgery    ? around 56 years old, for ? chronic dislocation  ? TOTAL ABDOMINAL HYSTERECTOMY W/ BILATERAL SALPINGOOPHORECTOMY  2006  ? TOTAL KNEE ARTHROPLASTY Left 11/21/2020  ? Procedure: TOTAL KNEE ARTHROPLASTY, RIGHT CORTISONE INJECTIONS;  Surgeon: Gaynelle Arabian, MD;  [facility];  Service: Orthopedics;  Laterality: Left;  14mn  ? ? ?There were no vitals filed for this visit. ? ? Subjective Assessment - 10/03/21 1104   ? ? Subjective Pt denies any falls today, almost fell one time last week becuase the R knee gave out on her.   ? Pertinent History L TKA 11/21/20, recent fall on 09/03/21, CVA in 2018, seizure disorder, HIV   ? Diagnostic tests 09/08/21- Xrays of neck and back, negative for acute findings   ? Patient Stated Goals improve R knee pain, improve bil LE strength   ? Currently in Pain? Yes   ? Pain Score 6    ? Pain Location Knee   ? Pain Orientation Right   ? Pain Descriptors / Indicators Throbbing;Sharp   ? ?  ?  ? ?  ? ? ? ? ? ? ? ? ? ? ? ? ? ? ? ? ? ? ? ?  [facility] Adult PT Treatment/Exercise - 10/03/21 0001   ? ?  ? Knee/Hip Exercises: Aerobic  ? Recumbent Bike L1x25mn   ?  ? Knee/Hip Exercises: Standing  ? Heel Raises Both;10 reps;3 seconds;2 sets   ? Heel Raises Limitations TKE + counter support   ? Hip Flexion AROM;Both;10 reps;Knee straight   ? Hip Flexion Limitations 1 hand support   ? Step Down Right;2 sets;10 reps;Hand Hold: 1;Step Height: 4";Left   ? Step Down Limitations lateral step down   ? Functional Squat 10 reps;3 seconds   ? Functional Squat Limitations 2 hand support   ?  ? Knee/Hip Exercises: Supine  ? Bridges with Clamshell Strengthening;Both;10 reps   green TB  ?  ? Knee/Hip Exercises: Sidelying  ? Hip ABduction Strengthening;Both;10 reps   ? Hip ABduction Limitations fire hydrant   ? Clams R/L clams GTB  x 10, 3 sec hold   ? ?  ?  ? ?  ? ? ? ? ? ? ? ? ? ? ? ? PT Short Term Goals - 09/25/21 1324   ? ?  ? PT SHORT TERM GOAL #1  ? Title Patient will be independent in initial  HEP to improve strength/mobility for better functional independence with ADLs   ? Time 2   ? Period Weeks   ? Status Achieved   09/25/21  ? Target Date 09/26/21   ?  ? PT SHORT TERM GOAL #2  ? Title Pt will report good understanding of falls prevention in the home   ? Time 2   ? Period Weeks   ? Status Achieved   09/25/21  ? Target Date 09/26/21   ? ?  ?  ? ?  ? ? ? ? PT Long Term Goals - 10/03/21 1149   ? ?  ? PT LONG TERM GOAL #1  ? Title Patient will be independent with ongoing/advanced HEP for self-management at home in order to build upon functional gains in therapy   ? Time 6   ? Period Weeks   ? Status On-going   ? Target Date 10/24/21   ?  ? PT LONG TERM GOAL #2  ? Title Patient to report reduction in frequency and intensity of LBP and B knee pain by >/= 50% to allow for improved activity tolerance   ? Baseline severe R knee pain 8/10, also reports increased LBP following fall   ? Time 6   ? Period Weeks   ? Status On-going   knee pain improved to 60% on L, 30% on R  ? Target Date 10/24/21   ?  ? PT LONG TERM GOAL #3  ? Title Pt. will demonstrate improved functional strength by completing 5x STS in < 20 seconds   ? Baseline 40 seconds, hands on thighs, increased pain   ? Status On-going   ?  ? PT LONG TERM GOAL #4  ? Title Pt. will improve gait velocity to >0.8 m/s to ambulate safely in community   ? Baseline 0.6 m/s, SPC, antalgic   ? Time 6   ? Period Weeks   ? Status On-going   ? Target Date 10/24/21   ?  ? PT LONG TERM GOAL #5  ? Title Patient will demonstrate improved RLE strength to >/= 4/5 for improved stability and ease of mobility   ? Baseline R knee strength 3-/5, R hip 3/5   ? Time 6   ? Period Weeks   ? Status On-going   ?  Target Date 10/24/21   ? ?  ?  ? ?  ? ? ? ? ? ? ? ? Plan - 10/03/21 1152   ? ? Clinical Impression Statement Pt denies any falls since last visit, however incidence of her R knee giving out her still. We did work more on ecc quad strength to help reduce knee buckling and improve  stabilization. Cues needed for post WS with squats. Cues needed for toe tap to keep tension on stance leg with lateral step downs. Pt reported 60% improvement in L knee pain and 30% improvement in R knee, slowly making progress.   ? Personal Factors and Comorbidities Comorbidity 3+   ? Comorbidities history of CVA, severe R knee OA, L TKA, HTN, asthma, seizure disorder, cirrhosis   ? PT Frequency 2x / week   ? PT Duration 6 weeks   ? PT Treatment/Interventions ADLs/Self Care Home Management;Cryotherapy;Electrical Stimulation;Iontophoresis 31m/ml Dexamethasone;Moist Heat;Ultrasound;Gait training;Stair training;Functional mobility training;Therapeutic activities;Therapeutic exercise;Balance training;Neuromuscular re-education;Patient/family education;Manual techniques;Passive range of motion;Dry needling;Taping;Joint Manipulations   ? PT Next Visit Plan review/progress exercises for core/hip/LE strengthening as tolerated, modalities R knee (ionto, DN, [place]   ? PT Home Exercise Plan Access Code: [email or web address]  ? Consulted and Agree with Plan of Care Patient   ? ?  ?  ? ?  ? ? ?Patient will benefit from skilled therapeutic intervention in order to improve the following deficits and impairments:  Abnormal gait, Decreased activity tolerance, Decreased endurance, Decreased range of motion, Decreased strength, Hypomobility, Increased fascial restricitons, Impaired sensation, Pain, Improper body mechanics, Impaired tone, Impaired flexibility, Increased muscle spasms, Difficulty walking, Decreased mobility, Decreased balance ? ?Visit Diagnosis: ?Acute pain of right knee ? ?Stiffness of right knee, not elsewhere classified ? ?Muscle weakness (generalized) ? ?Other abnormalities of gait and mobility ? ?Chronic low back pain, unspecified back pain laterality, unspecified whether sciatica present ? ?Acute pain of left knee ? ? ? ? ?Problem List ?Patient Active Problem List  ? Diagnosis Date Noted  ? Sacroiliac joint dysfunction of  left side 09/06/2021  ? Patellar contusion, right, initial encounter 05/04/2021  ? History of total knee arthroplasty, left 05/04/2021  ? Medication monitoring encounter 05/03/2021  ? Primary osteoarthrit

## 2021-10-12 ENCOUNTER — Encounter: Payer: Medicaid Other | Admitting: Gastroenterology

## 2021-10-16 ENCOUNTER — Other Ambulatory Visit: Payer: Self-pay | Admitting: Family Medicine

## 2021-10-16 ENCOUNTER — Telehealth: Payer: Self-pay | Admitting: Family Medicine

## 2021-10-16 DIAGNOSIS — E039 Hypothyroidism, unspecified: Secondary | ICD-10-CM

## 2021-10-16 MED ORDER — LEVOTHYROXINE SODIUM 175 MCG PO TABS: 175.0000 ug | ORAL_TABLET | Freq: Every day | ORAL | 2 refills | Status: DC

## 2021-10-16 MED ORDER — HYOSCYAMINE SULFATE 0.125 MG SL SUBL: 0.1250 mg | SUBLINGUAL_TABLET | SUBLINGUAL | 1 refills | Status: AC | PRN

## 2021-10-16 NOTE — Telephone Encounter (Signed)
Synthroid and luxaten refill requested  ?

## 2021-10-16 NOTE — Progress Notes (Signed)
Meds ordered this encounter  ?Medications  ? levothyroxine (SYNTHROID) 175 MCG tablet  ?  Sig: Take 1 tablet (175 mcg total) by mouth daily before breakfast.  ?  Dispense:  90 tablet  ?  Refill:  2  ?  Order Specific Question:   Supervising Provider  ?  AnswerTresa Garter [[id number]]  ? hyoscyamine (LEVSIN SL) 0.125 MG SL tablet  ?  Sig: Place 1 tablet (0.125 mg total) under the tongue every 4 (four) hours as needed.  ?  Dispense:  90 tablet  ?  Refill:  1  ?  Order Specific Question:   Supervising Provider  ?  AnswerTresa Garter [[id number]]  ? [name]  APRN, MSN, FNP-C ?[facility] ?[address]  ?[place], [place] [address] ?[phone] ? ?

## 2021-10-19 ENCOUNTER — Ambulatory Visit: Payer: Medicaid Other | Admitting: Dermatology

## 2021-10-24 ENCOUNTER — Ambulatory Visit: Payer: Medicaid Other | Admitting: Physical Therapy

## 2021-11-01 ENCOUNTER — Ambulatory Visit (INDEPENDENT_AMBULATORY_CARE_PROVIDER_SITE_OTHER): Payer: Medicaid Other | Admitting: Internal Medicine

## 2021-11-01 ENCOUNTER — Encounter: Payer: Self-pay | Admitting: Internal Medicine

## 2021-11-01 ENCOUNTER — Other Ambulatory Visit: Payer: Self-pay

## 2021-11-01 ENCOUNTER — Ambulatory Visit: Payer: Medicaid Other | Admitting: Internal Medicine

## 2021-11-01 VITALS — BP 146/87 | HR 77 | Temp 98.1°F | Ht 69.0 in | Wt 216.0 lb

## 2021-11-01 DIAGNOSIS — K7460 Unspecified cirrhosis of liver: Secondary | ICD-10-CM | POA: Diagnosis not present

## 2021-11-01 DIAGNOSIS — Z21 Asymptomatic human immunodeficiency virus [HIV] infection status: Secondary | ICD-10-CM

## 2021-11-01 DIAGNOSIS — M170 Bilateral primary osteoarthritis of knee: Secondary | ICD-10-CM | POA: Diagnosis not present

## 2021-11-01 DIAGNOSIS — G894 Chronic pain syndrome: Secondary | ICD-10-CM | POA: Diagnosis not present

## 2021-11-01 DIAGNOSIS — M62838 Other muscle spasm: Secondary | ICD-10-CM

## 2021-11-01 DIAGNOSIS — B20 Human immunodeficiency virus [HIV] disease: Secondary | ICD-10-CM | POA: Diagnosis not present

## 2021-11-01 DIAGNOSIS — F332 Major depressive disorder, recurrent severe without psychotic features: Secondary | ICD-10-CM | POA: Diagnosis present

## 2021-11-01 MED ORDER — SYMTUZA 800-150-200-10 MG PO TABS: 1.0000 | ORAL_TABLET | Freq: Every day | ORAL | 11 refills | Status: DC

## 2021-11-01 MED ORDER — IBUPROFEN 800 MG PO TABS: 800.0000 mg | ORAL_TABLET | Freq: Three times a day (TID) | ORAL | 3 refills | Status: DC | PRN

## 2021-11-01 MED ORDER — SEROQUEL 50 MG PO TABS: 50.0000 mg | ORAL_TABLET | Freq: Every day | ORAL | 1 refills | Status: DC

## 2021-11-03 ENCOUNTER — Encounter: Payer: Self-pay | Admitting: Internal Medicine

## 2021-11-03 NOTE — Assessment & Plan Note (Signed)
Ok from infectious disease standpoint for knee replacement  ?

## 2021-11-03 NOTE — Assessment & Plan Note (Signed)
She continues to do well, no concerns.  CD4 and viral load have remained stable.  Continue Biktarvy and rtc in 6 months.  ?

## 2021-11-03 NOTE — Progress Notes (Signed)
? ?  Subjective:  ? ? Patient ID: Cheryl Thompson, female    DOB: October 11, 1965, 56 y.o.   MRN: [id number] ? ?HPI ?She is here for folllow up of HIV ?She continues on [place] and denies any missed doses.  She remainssober, now going on 7 years and remains pleased with the progress.  No issues with getting, taking or tolerating her medication. Plan for a knee replacement by Dr. Maureen Ralphs in August.   ? ? ?Review of Systems  ?Constitutional:  Negative for fatigue.  ?Gastrointestinal:  Negative for diarrhea and nausea.  ?Skin:  Negative for rash.  ? ?   ?Objective:  ? Physical Exam ?Eyes:  ?   General: No scleral icterus. ?Pulmonary:  ?   Effort: Pulmonary effort is normal.  ?Neurological:  ?   General: No focal deficit present.  ?   Mental Status: She is alert.  ?Psychiatric:     ?   Mood and Affect: Mood normal.  ? ?SH: no alcohol ? ? ? ?   ?Assessment & Plan:  ? ? ?

## 2021-11-03 NOTE — Assessment & Plan Note (Signed)
Due for a screening ultrasound.  This was being done by GI.  I will clarify next visit if this is being done outside or if I need to order it.   ?

## 2021-11-04 LAB — HELPER T-LYMPH-CD4 (ARMC ONLY)
% CD 4 Pos. Lymph.: 48.8 % (ref 30.8–58.5)
Absolute CD 4 Helper: 1513 /uL (ref 359–1519)
Basophils Absolute: 0 10*3/uL (ref 0.0–0.2)
Basos: 0 %
EOS (ABSOLUTE): 0.2 10*3/uL (ref 0.0–0.4)
Eos: 3 %
Hematocrit: 36.9 % (ref 34.0–46.6)
Hemoglobin: 11.9 g/dL (ref 11.1–15.9)
Immature Grans (Abs): 0 10*3/uL (ref 0.0–0.1)
Immature Granulocytes: 0 %
Lymphocytes Absolute: 3.1 10*3/uL (ref 0.7–3.1)
Lymphs: 41 %
MCH: 29.6 pg (ref 26.6–33.0)
MCHC: 32.2 g/dL (ref 31.5–35.7)
MCV: 92 fL (ref 79–97)
Monocytes Absolute: 0.4 10*3/uL (ref 0.1–0.9)
Monocytes: 5 %
Neutrophils Absolute: 3.7 10*3/uL (ref 1.4–7.0)
Neutrophils: 51 %
Platelets: 154 10*3/uL (ref 150–450)
RBC: 4.02 x10E6/uL (ref 3.77–5.28)
RDW: 13.1 % (ref 11.7–15.4)
WBC: 7.4 10*3/uL (ref 3.4–10.8)

## 2021-11-04 LAB — HIV-1 RNA QUANT-NO REFLEX-BLD
HIV 1 RNA Quant: <20 copies/mL — AB
HIV-1 RNA Quant, Log: <1.3 Log copies/mL — AB

## 2021-11-07 LAB — T-HELPER CELL (CD4) - (RCID CLINIC ONLY)

## 2021-11-08 ENCOUNTER — Encounter: Payer: Medicaid Other | Admitting: Physical Therapy

## 2021-11-10 ENCOUNTER — Telehealth: Payer: Self-pay | Admitting: Family Medicine

## 2021-11-10 MED ORDER — CYCLOBENZAPRINE HCL 10 MG PO TABS: ORAL_TABLET | ORAL | 1 refills | Status: DC

## 2021-11-10 NOTE — Telephone Encounter (Signed)
Patient called states Chronic Back pain flared up &  muscle relaxer (Baclofen last given isn't working to relieve pain . ? ?--Patient ask for a different muscle relaxer. ? ?--Pt uses:  ?[place] [place] [facility] [place]  ?[address], [place] [address]  ?Phone:  [phone]  Fax:  [phone]  ? ?--glh ?

## 2021-11-20 ENCOUNTER — Ambulatory Visit: Payer: Medicaid Other | Admitting: Family Medicine

## 2021-11-22 ENCOUNTER — Encounter: Payer: Self-pay | Admitting: Family Medicine

## 2021-11-22 ENCOUNTER — Ambulatory Visit (INDEPENDENT_AMBULATORY_CARE_PROVIDER_SITE_OTHER): Payer: Medicaid Other | Admitting: Family Medicine

## 2021-11-22 VITALS — BP 136/80 | Ht 69.0 in | Wt 216.0 lb

## 2021-11-22 DIAGNOSIS — M5416 Radiculopathy, lumbar region: Secondary | ICD-10-CM

## 2021-11-22 NOTE — Patient Instructions (Signed)
Good to see you ?We will get the MRI at the [facility] ?Please send me a message in MyChart with any questions or updates.  ?We will set up a virtual visit once the MRI is resulted..  ? ?--Dr. Raeford Razor ? ?

## 2021-11-22 NOTE — Progress Notes (Signed)
?  Cheryl Thompson - 56 y.o. female MRN [id number]  Date of birth: [birth year] ? ?SUBJECTIVE:  Including CC & ROS.  ?No chief complaint on file. ? ? ?Cheryl Thompson is a 56 y.o. female that is presenting with acute worsening of her low back pain and radicular pain.  She has had repeated falls over the past 2 months.  Previous x-ray has been normal.  She has localized low back pain as well as radicular pain down each leg.  No history of back surgery. ? ? ?Review of Systems ?See HPI  ? ?HISTORY: Past Medical, Surgical, Social, and Family History Reviewed & Updated per EMR.   ?Pertinent Historical Findings include: ? ?Past Medical History:  ?Diagnosis Date  ? Acute alcoholic hepatitis   ? Alcoholism ([place])   ? Anxiety   ? Arthritis   ? Colon polyps   ? Depression   ? Diabetes ([place])   ? borderline on Metformin   ? Diverticulitis 2021  ? Diverticulosis 2021  ? Hepatitis C   ? Hiatal hernia   ? HIV (human immunodeficiency virus infection) ([place])   ? HLD (hyperlipidemia)   ? Hypertension   ? Hypothyroidism   ? Pneumonia   ? as a teenager   ? Seizures ([place])   ? 2016 last seizure per pt  ? Stroke [facility]) 2018  ? weakness on left side   ? ? ?Past Surgical History:  ?Procedure Laterality Date  ? CHOLECYSTECTOMY    ? COLONOSCOPY  08/11/2018  ? [facility] polyp  ? dental    ? right knee surgery    ? around 56 years old, for ? chronic dislocation  ? TOTAL ABDOMINAL HYSTERECTOMY W/ BILATERAL SALPINGOOPHORECTOMY  2006  ? TOTAL KNEE ARTHROPLASTY Left 11/21/2020  ? Procedure: TOTAL KNEE ARTHROPLASTY, RIGHT CORTISONE INJECTIONS;  Surgeon: Gaynelle Arabian, MD;  [facility];  Service: Orthopedics;  Laterality: Left;  49mn  ? ? ? ?PHYSICAL EXAM:  ?VS: BP 136/80 (BP Location: Left Arm, Patient Position: Sitting)   Ht '5\' 9"'$  (1.753 m)   Wt 216 lb (98 kg)   BMI 31.90 kg/m?  ?Physical Exam ?Gen: NAD, alert, cooperative with exam, well-appearing ?MSK:  ?Back:  ?Pain with flexion and extension. ?Diminished deep tendon reflexes at the  Achilles bilaterally. ?Positive straight leg raise on the right ?Neurovascularly intact   ? ? ? ? ?ASSESSMENT & PLAN:  ? ?Lumbar radiculopathy ?Acute on chronic in nature.  Has had repeated falls over the past few months.  Now she is having more constant low back pain with radicular pain down the right and left leg.  Previous x-rays have been normal.  She has been under greater than 6 weeks of physician directed home exercise therapy ?-Counseled on home exercise therapy and supportive care. ?-MRI of the lumbar spine to evaluate for nerve impingement for consideration of epidural use. ? ? ? ? ?

## 2021-11-22 NOTE — Assessment & Plan Note (Signed)
Acute on chronic in nature.  Has had repeated falls over the past few months.  Now she is having more constant low back pain with radicular pain down the right and left leg.  Previous x-rays have been normal.  She has been under greater than 6 weeks of physician directed home exercise therapy ?-Counseled on home exercise therapy and supportive care. ?-MRI of the lumbar spine to evaluate for nerve impingement for consideration of epidural use. ?

## 2021-11-26 ENCOUNTER — Ambulatory Visit (INDEPENDENT_AMBULATORY_CARE_PROVIDER_SITE_OTHER): Payer: Medicaid Other

## 2021-11-26 DIAGNOSIS — M5416 Radiculopathy, lumbar region: Secondary | ICD-10-CM | POA: Diagnosis not present

## 2021-11-28 ENCOUNTER — Telehealth: Payer: Self-pay | Admitting: Family Medicine

## 2021-11-28 NOTE — Telephone Encounter (Signed)
Called pt to schedule Virtual OV for MRI result review.--No answer--Message left. ?--glh ?

## 2021-11-29 DIAGNOSIS — S060XAA Concussion with loss of consciousness status unknown, initial encounter: Secondary | ICD-10-CM

## 2021-11-29 HISTORY — DX: Concussion with loss of consciousness status unknown, initial encounter: S06.0XAA

## 2021-11-30 ENCOUNTER — Telehealth (INDEPENDENT_AMBULATORY_CARE_PROVIDER_SITE_OTHER): Payer: Medicaid Other | Admitting: Family Medicine

## 2021-11-30 DIAGNOSIS — M5416 Radiculopathy, lumbar region: Secondary | ICD-10-CM

## 2021-11-30 NOTE — Assessment & Plan Note (Signed)
MRI was not demonstrating a nerve impingement.  Possible for pain be related to the facet joints or the SI joint. -Counseled on home exercise therapy and supportive care. -Could consider injections or a nerve study.

## 2021-11-30 NOTE — Progress Notes (Signed)
Virtual Visit via Telephone Note  I connected with [name] on 11/30/21 at  1:10 PM EDT by telephone and verified that I am speaking with the correct person using two identifiers.  Location: Patient: home Provider: office   I discussed the limitations, risks, security and privacy concerns of performing an evaluation and management service by telephone and the availability of in person appointments. I also discussed with the patient that there may be a patient responsible charge related to this service. The patient expressed understanding and agreed to proceed.   History of Present Illness:  Ms. Leidy is a 56 year old female that is following up after the MRI of her lumbar spine.  This was not demonstrating any significant canal or neuroforaminal stenosis.  She does show mild facet degenerative changes at L4-5 and L5-S1.  She continues to have pain in the legs.   Observations/Objective:   Assessment and Plan:  Lumbar radiculopathy: MRI was not demonstrating a nerve impingement.  Possible for pain be related to the facet joints or the SI joint. -Counseled on home exercise therapy and supportive care. -Could consider injections or a nerve study.  Follow Up Instructions:    I discussed the assessment and treatment plan with the patient. The patient was provided an opportunity to ask questions and all were answered. The patient agreed with the plan and demonstrated an understanding of the instructions.   The patient was advised to call back or seek an in-person evaluation if the symptoms worsen or if the condition fails to improve as anticipated.  I provided 7 minutes of non-face-to-face time during this encounter.   Clearance Coots, MD

## 2021-12-01 ENCOUNTER — Ambulatory Visit (INDEPENDENT_AMBULATORY_CARE_PROVIDER_SITE_OTHER): Payer: Medicare Other | Admitting: Family Medicine

## 2021-12-01 ENCOUNTER — Ambulatory Visit: Payer: Self-pay

## 2021-12-01 ENCOUNTER — Encounter: Payer: Self-pay | Admitting: Family Medicine

## 2021-12-01 VITALS — BP 130/80 | Ht 69.0 in | Wt 216.0 lb

## 2021-12-01 DIAGNOSIS — S161XXA Strain of muscle, fascia and tendon at neck level, initial encounter: Secondary | ICD-10-CM | POA: Diagnosis not present

## 2021-12-01 DIAGNOSIS — Z96652 Presence of left artificial knee joint: Secondary | ICD-10-CM

## 2021-12-01 DIAGNOSIS — S060X9A Concussion with loss of consciousness of unspecified duration, initial encounter: Secondary | ICD-10-CM | POA: Diagnosis not present
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
## 2021-12-01 NOTE — Patient Instructions (Signed)
Good to see you Please try heat on the neck and ice on the knee. Please try the exercises. Please send me a message in MyChart with any questions or updates.  Please see me back to start shockwave therapy.   --Dr. Raeford Razor
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
## 2021-12-04 ENCOUNTER — Telehealth: Payer: Self-pay | Admitting: Family Medicine

## 2021-12-04 ENCOUNTER — Telehealth: Payer: Self-pay | Admitting: Gastroenterology
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
## 2021-12-04 NOTE — Telephone Encounter (Signed)
Ok to update OOW note?
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
## 2021-12-05 ENCOUNTER — Encounter: Payer: Medicaid Other | Admitting: Gastroenterology

## 2021-12-05 ENCOUNTER — Encounter: Payer: Self-pay | Admitting: Family Medicine

## 2021-12-05 ENCOUNTER — Ambulatory Visit: Payer: Medicaid Other | Admitting: Family Medicine

## 2021-12-05 ENCOUNTER — Ambulatory Visit (INDEPENDENT_AMBULATORY_CARE_PROVIDER_SITE_OTHER): Payer: Medicaid Other | Admitting: Family Medicine

## 2021-12-05 VITALS — BP 152/84 | HR 66 | Temp 97.4°F | Ht 69.0 in | Wt 219.8 lb

## 2021-12-05 DIAGNOSIS — R82998 Other abnormal findings in urine: Secondary | ICD-10-CM

## 2021-12-05 DIAGNOSIS — E039 Hypothyroidism, unspecified: Secondary | ICD-10-CM | POA: Diagnosis not present

## 2021-12-05 DIAGNOSIS — I10 Essential (primary) hypertension: Secondary | ICD-10-CM

## 2021-12-05 DIAGNOSIS — G894 Chronic pain syndrome: Secondary | ICD-10-CM

## 2021-12-05 DIAGNOSIS — E1165 Type 2 diabetes mellitus with hyperglycemia: Secondary | ICD-10-CM | POA: Diagnosis not present

## 2021-12-05 DIAGNOSIS — M25562 Pain in left knee: Secondary | ICD-10-CM | POA: Diagnosis not present

## 2021-12-05 DIAGNOSIS — F17200 Nicotine dependence, unspecified, uncomplicated: Secondary | ICD-10-CM

## 2021-12-05 LAB — POCT URINALYSIS DIP (CLINITEK)
Bilirubin, UA: NEGATIVE
Blood, UA: NEGATIVE
Glucose, UA: NEGATIVE mg/dL
Ketones, POC UA: NEGATIVE mg/dL
Nitrite, UA: NEGATIVE
POC PROTEIN,UA: NEGATIVE
Spec Grav, UA: 1.02 (ref 1.010–1.025)
Urobilinogen, UA: 0.2 E.U./dL
pH, UA: 5.5 (ref 5.0–8.0)

## 2021-12-05 LAB — POCT GLYCOSYLATED HEMOGLOBIN (HGB A1C)
HbA1c POC (<> result, manual entry): 6 % (ref 4.0–5.6)
HbA1c, POC (controlled diabetic range): 6 % (ref 0.0–7.0)
HbA1c, POC (prediabetic range): 6 % (ref 5.7–6.4)
Hemoglobin A1C: 6 % — AB (ref 4.0–5.6)

## 2021-12-05 MED ORDER — KETOROLAC TROMETHAMINE 60 MG/2ML IM SOLN
60.0000 mg | Freq: Once | INTRAMUSCULAR | Status: AC
  Administered 2021-12-05: 60 mg via INTRAMUSCULAR

## 2021-12-05 NOTE — Telephone Encounter (Signed)
Okay thanks for letting me know
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
## 2021-12-06 LAB — COMPREHENSIVE METABOLIC PANEL
ALT: 17 IU/L (ref 0–32)
AST: 30 IU/L (ref 0–40)
Albumin/Globulin Ratio: 1 — ABNORMAL LOW (ref 1.2–2.2)
Albumin: 4.1 g/dL (ref 3.8–4.9)
Alkaline Phosphatase: 76 IU/L (ref 44–121)
BUN/Creatinine Ratio: 14 (ref 9–23)
BUN: 12 mg/dL (ref 6–24)
Bilirubin Total: <0.2 mg/dL (ref 0.0–1.2)
CO2: 26 mmol/L (ref 20–29)
Calcium: 9.5 mg/dL (ref 8.7–10.2)
Chloride: 104 mmol/L (ref 96–106)
Creatinine, Ser: 0.84 mg/dL (ref 0.57–1.00)
Globulin, Total: 4 g/dL (ref 1.5–4.5)
Glucose: 84 mg/dL (ref 70–99)
Potassium: 4.1 mmol/L (ref 3.5–5.2)
Sodium: 140 mmol/L (ref 134–144)
Total Protein: 8.1 g/dL (ref 6.0–8.5)
eGFR: 82 mL/min/{1.73_m2} (ref >59)

## 2021-12-06 LAB — THYROID PANEL WITH TSH
Free Thyroxine Index: 2.4 (ref 1.2–4.9)
T3 Uptake Ratio: 28 % (ref 24–39)
T4, Total: 8.6 ug/dL (ref 4.5–12.0)
TSH: 1.27 u[IU]/mL (ref 0.450–4.500)

## 2021-12-07 ENCOUNTER — Encounter: Payer: Self-pay | Admitting: Internal Medicine

## 2021-12-07 ENCOUNTER — Encounter: Payer: Self-pay | Admitting: Family Medicine

## 2021-12-07 ENCOUNTER — Ambulatory Visit (INDEPENDENT_AMBULATORY_CARE_PROVIDER_SITE_OTHER): Payer: Self-pay | Admitting: Family Medicine

## 2021-12-07 VITALS — Ht 69.0 in | Wt 219.0 lb

## 2021-12-07 DIAGNOSIS — S161XXD Strain of muscle, fascia and tendon at neck level, subsequent encounter: Secondary | ICD-10-CM

## 2021-12-07 DIAGNOSIS — Z96652 Presence of left artificial knee joint: Secondary | ICD-10-CM
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
## 2021-12-07 NOTE — Assessment & Plan Note (Signed)
Completed shockwave therapy  
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
## 2021-12-09 LAB — URINE CULTURE

## 2021-12-15 ENCOUNTER — Ambulatory Visit (HOSPITAL_BASED_OUTPATIENT_CLINIC_OR_DEPARTMENT_OTHER)
Admission: RE | Admit: 2021-12-15 | Discharge: 2021-12-15 | Disposition: A | Discharge status: Home / Self Care | Payer: Medicare Other | Source: Ambulatory Visit | Attending: Family Medicine | Admitting: Family Medicine

## 2021-12-15 DIAGNOSIS — Z96652 Presence of left artificial knee joint: Secondary | ICD-10-CM | POA: Insufficient documentation

## 2021-12-19 ENCOUNTER — Encounter: Payer: Self-pay | Admitting: Family Medicine

## 2021-12-19 ENCOUNTER — Telehealth (INDEPENDENT_AMBULATORY_CARE_PROVIDER_SITE_OTHER): Payer: Medicaid Other | Admitting: Family Medicine

## 2021-12-19 DIAGNOSIS — Z96652 Presence of left artificial knee joint: Secondary | ICD-10-CM | POA: Diagnosis present
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
## 2021-12-21 ENCOUNTER — Telehealth: Payer: Self-pay

## 2021-12-21 NOTE — Telephone Encounter (Signed)
Paperwork has been faxed on 12/21/21

## 2022-01-01 ENCOUNTER — Telehealth (HOSPITAL_COMMUNITY): Payer: Self-pay

## 2022-01-01 ENCOUNTER — Encounter (HOSPITAL_COMMUNITY): Payer: Self-pay | Admitting: Psychiatry

## 2022-01-01 ENCOUNTER — Telehealth (INDEPENDENT_AMBULATORY_CARE_PROVIDER_SITE_OTHER): Payer: Medicare Other | Admitting: Psychiatry

## 2022-01-01 DIAGNOSIS — G629 Polyneuropathy, unspecified: Secondary | ICD-10-CM

## 2022-01-01 DIAGNOSIS — F332 Major depressive disorder, recurrent severe without psychotic features: Secondary | ICD-10-CM

## 2022-01-01 DIAGNOSIS — F4310 Post-traumatic stress disorder, unspecified: Secondary | ICD-10-CM

## 2022-01-01 DIAGNOSIS — F411 Generalized anxiety disorder: Secondary | ICD-10-CM

## 2022-01-01 DIAGNOSIS — F3163 Bipolar disorder, current episode mixed, severe, without psychotic features: Secondary | ICD-10-CM | POA: Diagnosis not present

## 2022-01-01 MED ORDER — QUETIAPINE FUMARATE 50 MG PO TABS: 50.0000 mg | ORAL_TABLET | Freq: Every day | ORAL | 1 refills | Status: DC

## 2022-01-01 MED ORDER — PREGABALIN 25 MG PO CAPS: ORAL_CAPSULE | ORAL | 1 refills | Status: DC

## 2022-01-01 MED ORDER — SEROQUEL 50 MG PO TABS: 50.0000 mg | ORAL_TABLET | Freq: Every day | ORAL | 1 refills | Status: DC

## 2022-01-01 MED ORDER — DULOXETINE HCL 60 MG PO CPEP: 60.0000 mg | ORAL_CAPSULE | Freq: Every day | ORAL | 0 refills | Status: DC

## 2022-01-01 NOTE — Telephone Encounter (Signed)
Please change Seroquel to generic as it is a preferred medication for Medicaid unless you want the brand name medically necessary then I will need to do a prior authorization. Please advise.
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
## 2022-01-02 ENCOUNTER — Ambulatory Visit (HOSPITAL_COMMUNITY): Payer: Medicaid Other | Admitting: Licensed Clinical Social Worker

## 2022-01-02 NOTE — Progress Notes (Signed)
Therapist contacted patient through My Chart for an assessment and she did not respond session is a no show.

## 2022-01-11 ENCOUNTER — Encounter: Payer: Self-pay | Admitting: Gastroenterology

## 2022-01-11 ENCOUNTER — Ambulatory Visit (AMBULATORY_SURGERY_CENTER): Payer: Medicaid Other | Admitting: Gastroenterology

## 2022-01-11 VITALS — BP 125/80 | HR 74 | Temp 97.3°F | Resp 14 | Ht 69.0 in | Wt 213.0 lb

## 2022-01-11 DIAGNOSIS — Z09 Encounter for follow-up examination after completed treatment for conditions other than malignant neoplasm: Secondary | ICD-10-CM

## 2022-01-11 DIAGNOSIS — D125 Benign neoplasm of sigmoid colon: Secondary | ICD-10-CM

## 2022-01-11 DIAGNOSIS — Z800 Family history of malignant neoplasm of digestive organs: Secondary | ICD-10-CM | POA: Diagnosis not present

## 2022-01-11 DIAGNOSIS — Z860100 Personal history of colon polyps, unspecified: Secondary | ICD-10-CM

## 2022-01-11 DIAGNOSIS — D123 Benign neoplasm of transverse colon: Secondary | ICD-10-CM | POA: Diagnosis not present

## 2022-01-11 MED ORDER — SODIUM CHLORIDE 0.9 % IV SOLN: 500.0000 mL | Freq: Once | INTRAVENOUS | Status: DC

## 2022-01-11 NOTE — Op Note (Signed)
[place] Patient Name: Cheryl Thompson Procedure Date: 01/11/2022 10:51 AM MRN: [id number] Endoscopist: Remo Lipps P. Havery Moros , MD Age: 56 Referring MD:  Date of Birth: [birth year] Gender: Female Account #: [id number] Procedure:                Colonoscopy Indications:              High risk colon cancer surveillance: Personal                            history of colonic polyps - 1cm TVA removed 07/2018,                            brother with colon cancer diagnosed age 62s Medicines:                Monitored Anesthesia Care Procedure:                Pre-Anesthesia Assessment:                           - Prior to the procedure, a History and Physical                            was performed, and patient medications and                            allergies were reviewed. The patient's tolerance of                            previous anesthesia was also reviewed. The risks                            and benefits of the procedure and the sedation                            options and risks were discussed with the patient.                            All questions were answered, and informed consent                            was obtained. Prior Anticoagulants: The patient has                            taken no previous anticoagulant or antiplatelet                            agents. ASA Grade Assessment: III - A patient with                            severe systemic disease. After reviewing the risks                            and benefits, the patient was deemed in  satisfactory condition to undergo the procedure.                           After obtaining informed consent, the colonoscope                            was passed under direct vision. Throughout the                            procedure, the patient's blood pressure, pulse, and                            oxygen saturations were monitored continuously. The                             PCF-HQ190L Colonoscope was introduced through the                            anus and advanced to the the cecum, identified by                            appendiceal orifice and ileocecal valve. The                            colonoscopy was performed without difficulty. The                            patient tolerated the procedure well. The quality                            of the bowel preparation was adequate. The                            ileocecal valve, appendiceal orifice, and rectum                            were photographed. Scope In: 11:07:12 AM Scope Out: 11:30:51 AM Scope Withdrawal Time: 0 hours 16 minutes 34 seconds  Total Procedure Duration: 0 hours 23 minutes 39 seconds  Findings:                 The perianal and digital rectal examinations were                            normal.                           The colon was tortuous.                           Many small-mouthed diverticula were found in the                            entire colon.  There were two small lipomas, in the ascending                            colon.                           Three sessile polyps were found in the transverse                            colon. The polyps were 3 to 5 mm in size. These                            polyps were removed with a cold snare. Resection                            and retrieval were complete.                           Two flat and sessile polyps were found in the                            sigmoid colon. The polyps were 3 to 5 mm in size.                            These polyps were removed with a cold snare.                            Resection and retrieval were complete.                           There were numerous small benign appearing                            hyperplastic polyps in the left colon. The exam was                            otherwise without abnormality. Complications:            No immediate complications.  Estimated blood loss:                            Minimal. Estimated Blood Loss:     Estimated blood loss was minimal. Impression:               - Tortuous colon.                           - Diverticulosis in the entire examined colon.                           - Small lipoma x 2 in the ascending colon.                           - Three 3 to 5 mm polyps in the transverse colon,  removed with a cold snare. Resected and retrieved.                           - Two 3 to 5 mm polyps in the sigmoid colon,                            removed with a cold snare. Resected and retrieved.                           - Numerous benign small hyperplastic polyps of the                            left colon                           - The examination was otherwise normal. Recommendation:           - Patient has a contact number available for                            emergencies. The signs and symptoms of potential                            delayed complications were discussed with the                            patient. Return to normal activities tomorrow.                            Written discharge instructions were provided to the                            patient.                           - Resume previous diet.                           - Continue present medications.                           - Await pathology results. Remo Lipps P. Meribeth Vitug, MD 01/11/2022 11:36:40 AM This report has been signed electronically.

## 2022-01-11 NOTE — Progress Notes (Signed)
Pt's states no medical or surgical changes since previsit or office visit. 

## 2022-01-11 NOTE — Patient Instructions (Signed)
Please read handouts provided. Continue present medications. Await pathology results.   YOU HAD AN ENDOSCOPIC PROCEDURE TODAY AT THE [facility]:   Refer to the procedure report that was given to you for any specific questions about what was found during the examination.  If the procedure report does not answer your questions, please call your gastroenterologist to clarify.  If you requested that your care partner not be given the details of your procedure findings, then the procedure report has been included in a sealed envelope for you to review at your convenience later.  YOU SHOULD EXPECT: Some feelings of bloating in the abdomen. Passage of more gas than usual.  Walking can help get rid of the air that was put into your GI tract during the procedure and reduce the bloating. If you had a lower endoscopy (such as a colonoscopy or flexible sigmoidoscopy) you may notice spotting of blood in your stool or on the toilet paper. If you underwent a bowel prep for your procedure, you may not have a normal bowel movement for a few days.  Please Note:  You might notice some irritation and congestion in your nose or some drainage.  This is from the oxygen used during your procedure.  There is no need for concern and it should clear up in a day or so.  SYMPTOMS TO REPORT IMMEDIATELY:  Following lower endoscopy (colonoscopy or flexible sigmoidoscopy):  Excessive amounts of blood in the stool  Significant tenderness or worsening of abdominal pains  Swelling of the abdomen that is new, acute  Fever of 100F or higher  For urgent or emergent issues, a gastroenterologist can be reached at any hour by calling [phone]. Do not use MyChart messaging for urgent concerns.    DIET:  We do recommend a small meal at first, but then you may proceed to your regular diet.  Drink plenty of fluids but you should avoid alcoholic beverages for 24 hours.  ACTIVITY:  You should plan to take it easy for  the rest of today and you should NOT DRIVE or use heavy machinery until tomorrow (because of the sedation medicines used during the test).    FOLLOW UP: Our staff will call the number listed on your records the next business day following your procedure.  We will call around 7:15- 8:00 am to check on you and address any questions or concerns that you may have regarding the information given to you following your procedure. If we do not reach you, we will leave a message.  If you develop any symptoms (ie: fever, flu-like symptoms, shortness of breath, cough etc.) before then, please call [phone].  If you test positive for Covid 19 in the 2 weeks post procedure, please call and report this information to us.    If any biopsies were taken you will be contacted by phone or by letter within the next 1-3 weeks.  Please call us at [phone] if you have not heard about the biopsies in 3 weeks.    SIGNATURES/CONFIDENTIALITY: You and/or your care partner have signed paperwork which will be entered into your electronic medical record.  These signatures attest to the fact that that the information above on your After Visit Summary has been reviewed and is understood.  Full responsibility of the confidentiality of this discharge information lies with you and/or your care-partner.  

## 2022-01-11 NOTE — Progress Notes (Signed)
Report to PACU, RN, vss, BBS= Clear.  
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
## 2022-01-11 NOTE — Progress Notes (Signed)
Called to room to assist during endoscopic procedure.  Patient ID and intended procedure confirmed with present staff. Received instructions for my participation in the procedure from the performing physician.  

## 2022-01-12 ENCOUNTER — Telehealth: Payer: Self-pay | Admitting: *Deleted

## 2022-01-12 NOTE — Telephone Encounter (Signed)
  Follow up Call-     01/11/2022   10:34 AM  Call back number  Post procedure Call Back phone  # [phone]  Permission to leave phone message Yes     Patient questions:  Do you have a fever, pain , or abdominal swelling? No. Pain Score  0 *  Have you tolerated food without any problems? Yes.    Have you been able to return to your normal activities? Yes.    Do you have any questions about your discharge instructions: Diet   No. Medications  No. Follow up visit  No.  Do you have questions or concerns about your Care? No.  Actions: * If pain score is 4 or above: No action needed, pain <4.

## 2022-01-31 ENCOUNTER — Ambulatory Visit (INDEPENDENT_AMBULATORY_CARE_PROVIDER_SITE_OTHER): Payer: Medicare Other | Admitting: Licensed Clinical Social Worker

## 2022-01-31 DIAGNOSIS — F3163 Bipolar disorder, current episode mixed, severe, without psychotic features: Secondary | ICD-10-CM | POA: Diagnosis not present

## 2022-01-31 DIAGNOSIS — F4310 Post-traumatic stress disorder, unspecified: Secondary | ICD-10-CM | POA: Diagnosis not present

## 2022-01-31 DIAGNOSIS — F332 Major depressive disorder, recurrent severe without psychotic features: Secondary | ICD-10-CM

## 2022-01-31 DIAGNOSIS — F411 Generalized anxiety disorder: Secondary | ICD-10-CM
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
## 2022-02-08 ENCOUNTER — Ambulatory Visit (HOSPITAL_COMMUNITY): Payer: Medicaid Other | Admitting: Psychiatry
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
## 2022-02-14 NOTE — Patient Instructions (Signed)
DUE TO SPACE LIMITATIONS, ONLY TWO VISITORS  (aged 57 and older) ARE ALLOWED TO COME WITH YOU AND STAY IN THE WAITING ROOM DURING YOUR PRE OP AND PROCEDURE.   **NO VISITORS ARE ALLOWED IN THE SHORT STAY AREA OR RECOVERY ROOM!!**  IF YOU WILL BE ADMITTED INTO THE HOSPITAL YOU ARE ALLOWED ONLY FOUR SUPPORT PEOPLE DURING VISITATION HOURS (7 AM -8PM)   The support person(s) must pass our screening, and use Hand sanitizing gel. Visitors GUEST BADGE MUST BE WORN VISIBLY  One adult visitor may remain with you overnight and MUST be in the room by 8 P.M.   You are not required to quarantine at this time prior to your surgery. However, you must do this: Hand Hygiene often Do NOT share personal items Notify your provider if you are in close contact with someone who has COVID or you develop fever 100.4 or greater, new onset of sneezing, cough, sore throat, shortness of breath or body aches.       Your procedure is scheduled on:  Monday February 26, 2022  Report to [facility] Main Entrance.  Report to admitting at: 09:00    AM  +++++Call this number if you have any questions or problems the morning of surgery [phone]  Do not eat food :After Midnight the night prior to your surgery/procedure.  After Midnight you may have the following liquids until   08:30  AM  DAY OF SURGERY  Clear Liquid Diet Water Black Coffee (sugar ok, NO MILK/CREAM OR CREAMERS)  Tea (sugar ok, NO MILK/CREAM OR CREAMERS) regular and decaf                             Plain Jell-O (NO RED)                                           Fruit ices (not with fruit pulp, NO RED)                                     Popsicles (NO RED)                                                                  Juice: apple, WHITE grape, WHITE cranberry Sports drinks like Gatorade (NO RED)                    The day of surgery:  Drink ONE (1) Pre-Surgery G2 at   08:30 AM the morning of surgery. Drink in one sitting. Do not sip.   This drink was given to you during your hospital pre-op appointment visit. Nothing else to drink after completing the Pre-Surgery G2.   If you have questions, please contact your surgeon's office.   FOLLOW ANY ADDITIONAL PRE OP INSTRUCTIONS YOU RECEIVED FROM YOUR SURGEON'S OFFICE!!!   Oral Hygiene is also important to reduce your risk of infection.        Remember - BRUSH YOUR TEETH THE MORNING OF SURGERY WITH YOUR REGULAR TOOTHPASTE  Do NOT  smoke after Midnight the night before surgery.  Take ONLY these medicines the morning of surgery with A SIP OF WATER: Keppra, Lyrica, Cymbalta, Symtuza, Synthroid, and if needed, you may take Hydroxyzine, and use Albuterol Inhaler.                   You may not have any metal on your body including hair pins, jewelry, and body piercing  Do not wear make-up, lotions, powders, perfumes, or deodorant  Do not wear nail polish including gel and S&S, artificial / acrylic nails, or any other type of covering on natural nails including finger and toenails. If you have artificial nails, gel coating, etc., that needs to be removed by a nail salon, Please have this removed prior to surgery. Not doing so may mean that your surgery could be cancelled or delayed if the Surgeon or anesthesia staff feels like they are unable to monitor you safely.   Do not shave 48 hours prior to surgery to avoid nicks in your skin which may contribute to postoperative infections.   You may bring a small overnight bag with you on the day of surgery, only pack items that are not valuable .[facility] IS NOT RESPONSIBLE   FOR VALUABLES THAT ARE LOST OR STOLEN.   DO NOT [place]. PHARMACY WILL DISPENSE MEDICATIONS LISTED ON YOUR MEDICATION LIST TO YOU DURING YOUR ADMISSION [place]!   Special Instructions: Bring a copy of your healthcare power of attorney and living will documents the day of surgery, if you wish to have them scanned into your  [facility] Medical Records- EPIC  Please read over the following fact sheets you were given: IF YOU HAVE QUESTIONS ABOUT YOUR PRE-OP INSTRUCTIONS, PLEASE CALL [phone]  ([place])   [facility] - Preparing for Surgery Before surgery, you can play an important role.  Because skin is not sterile, your skin needs to be as free of germs as possible.  You can reduce the number of germs on your skin by washing with CHG (chlorahexidine gluconate) soap before surgery.  CHG is an antiseptic cleaner which kills germs and bonds with the skin to continue killing germs even after washing. Please DO NOT use if you have an allergy to CHG or antibacterial soaps.  If your skin becomes reddened/irritated stop using the CHG and inform your nurse when you arrive at Short Stay. Do not shave (including legs and underarms) for at least 48 hours prior to the first CHG shower.  You may shave your face/neck.  Please follow these instructions carefully:  1.  Shower with CHG Soap the night before surgery and the  morning of surgery.  2.  If you choose to wash your hair, wash your hair first as usual with your normal  shampoo.  3.  After you shampoo, rinse your hair and body thoroughly to remove the shampoo.                             4.  Use CHG as you would any other liquid soap.  You can apply chg directly to the skin and wash.  Gently with a scrungie or clean washcloth.  5.  Apply the CHG Soap to your body ONLY FROM THE NECK DOWN.   Do not use on face/ open  Wound or open sores. Avoid contact with eyes, ears mouth and genitals (private parts).                       Wash face,  Genitals (private parts) with your normal soap.             6.  Wash thoroughly, paying special attention to the area where your  surgery  will be performed.  7.  Thoroughly rinse your body with warm water from the neck down.  8.  DO NOT shower/wash with your normal soap after using and rinsing off the CHG Soap.             9.  Pat yourself dry with a clean towel.            10.  Wear clean pajamas.            11.  Place clean sheets on your bed the night of your first shower and do not  sleep with pets.  ON THE DAY OF SURGERY : Do not apply any lotions/deodorants the morning of surgery.  Please wear clean clothes to the hospital/surgery center.    FAILURE TO FOLLOW THESE INSTRUCTIONS MAY RESULT IN THE CANCELLATION OF YOUR SURGERY  PATIENT SIGNATURE_________________________________  NURSE SIGNATURE__________________________________  ________________________________________________________________________   Adam Phenix    An incentive spirometer is a tool that can help keep your lungs clear and active. This tool measures how well you are filling your lungs with each breath. Taking long deep breaths may help reverse or decrease the chance of developing breathing (pulmonary) problems (especially infection) following: A long period of time when you are unable to move or be active. BEFORE THE PROCEDURE  If the spirometer includes an indicator to show your best effort, your nurse or respiratory therapist will set it to a desired goal. If possible, sit up straight or lean slightly forward. Try not to slouch. Hold the incentive spirometer in an upright position. INSTRUCTIONS FOR USE  Sit on the edge of your bed if possible, or sit up as far as you can in bed or on a chair. Hold the incentive spirometer in an upright position. Breathe out normally. Place the mouthpiece in your mouth and seal your lips tightly around it. Breathe in slowly and as deeply as possible, raising the piston or the ball toward the top of the column. Hold your breath for 3-5 seconds or for as long as possible. Allow the piston or ball to fall to the bottom of the column. Remove the mouthpiece from your mouth and breathe out normally. Rest for a few seconds and repeat Steps 1 through 7 at least 10 times every 1-2 hours when you  are awake. Take your time and take a few normal breaths between deep breaths. The spirometer may include an indicator to show your best effort. Use the indicator as a goal to work toward during each repetition. After each set of 10 deep breaths, practice coughing to be sure your lungs are clear. If you have an incision (the cut made at the time of surgery), support your incision when coughing by placing a pillow or rolled up towels firmly against it. Once you are able to get out of bed, walk around indoors and cough well. You may stop using the incentive spirometer when instructed by your caregiver.  RISKS AND COMPLICATIONS Take your time so you do not get dizzy or light-headed. If you are in pain, you may  need to take or ask for pain medication before doing incentive spirometry. It is harder to take a deep breath if you are having pain. AFTER USE Rest and breathe slowly and easily. It can be helpful to keep track of a log of your progress. Your caregiver can provide you with a simple table to help with this. If you are using the spirometer at home, follow these instructions: [place] IF:  You are having difficultly using the spirometer. You have trouble using the spirometer as often as instructed. Your pain medication is not giving enough relief while using the spirometer. You develop fever of 100.5 F (38.1 C) or higher.                                                                                                    SEEK IMMEDIATE MEDICAL CARE IF:  You cough up bloody sputum that had not been present before. You develop fever of 102 F (38.9 C) or greater. You develop worsening pain at or near the incision site. MAKE SURE YOU:  Understand these instructions. Will watch your condition. Will get help right away if you are not doing well or get worse. Document Released: 11/12/2006 Document Revised: 09/24/2011 Document Reviewed: 01/13/2007 [facility] Patient Information 2014  [place], [place].

## 2022-02-14 NOTE — Progress Notes (Signed)
COVID Vaccine received:  '[]'$  No '[x]'$  Yes  Date of any COVID positive Test in last 90 days:  PCP - Cammie Sickle, FNP Cardiologist -  Infectious Disease- Scharlene Gloss, MD  Chest x-ray -  EKG -  2018,  will repeat at PST Stress Test -  ECHO -  Cardiac Cath -   Pacemaker/ICD device     '[]'$  N/A Spinal Cord Stimulator:'[]'$  No '[]'$  Yes   Other Implants:   History of Sleep Apnea? '[]'$  No '[]'$  Yes  '[]'$  unknown Sleep Study Date:   CPAP used?- '[]'$  No '[]'$  Yes  (Instruct to bring their mask & Tubing)  Does the patient monitor blood sugar? '[]'$  No '[]'$  Yes  '[]'$  N/A Does patient have a [employer] or Dexacom? '[]'$  No '[]'$  Yes   Fasting Blood Sugar Ranges-  Checks Blood Sugar _____ times a day  Blood Thinner Instructions: Aspirin Instructions: Last Dose:  ERAS Protocol Ordered: '[]'$  No  '[x]'$  Yes PRE-SURGERY '[]'$  ENSURE  '[x]'$  G2   Comments:   Activity level: Patient can / can not do a flight of stairs without difficutly   Anesthesia review: Seizures, Hep C /cirrhosis, Hx CVA, SMoker  Patient denies shortness of breath, fever, cough and chest pain at PAT appointment  Patient verbalized understanding and agreement to the Pre-Surgical Instructions that were given to them at this PAT appointment. Patient was also educated of the need to review these PAT instructions again prior to his/her surgery.I reviewed the appropriate phone numbers to call if they have any and questions or concerns.

## 2022-02-15 ENCOUNTER — Encounter (HOSPITAL_COMMUNITY)
Admission: RE | Admit: 2022-02-15 | Discharge: 2022-02-15 | Disposition: A | Discharge status: Home / Self Care | Payer: Medicare Other | Source: Ambulatory Visit | Attending: Orthopedic Surgery | Admitting: Orthopedic Surgery

## 2022-02-15 ENCOUNTER — Encounter (HOSPITAL_COMMUNITY): Payer: Self-pay

## 2022-02-15 VITALS — BP 146/96 | HR 80 | Temp 98.4°F | Resp 20 | Ht 69.0 in | Wt 223.0 lb

## 2022-02-15 DIAGNOSIS — R7303 Prediabetes: Secondary | ICD-10-CM | POA: Insufficient documentation

## 2022-02-15 DIAGNOSIS — K769 Liver disease, unspecified: Secondary | ICD-10-CM | POA: Insufficient documentation

## 2022-02-15 DIAGNOSIS — Z01818 Encounter for other preprocedural examination: Secondary | ICD-10-CM | POA: Insufficient documentation

## 2022-02-15 DIAGNOSIS — I10 Essential (primary) hypertension: Secondary | ICD-10-CM | POA: Diagnosis not present

## 2022-02-15 HISTORY — DX: Bipolar disorder, unspecified: F31.9

## 2022-02-15 HISTORY — DX: Myoneural disorder, unspecified: G70.9

## 2022-02-15 HISTORY — DX: Prediabetes: R73.03

## 2022-02-15 HISTORY — DX: Personal history of other diseases of the digestive system: Z87.19

## 2022-02-15 LAB — COMPREHENSIVE METABOLIC PANEL
ALT: 15 U/L (ref 0–44)
AST: 24 U/L (ref 15–41)
Albumin: 3.9 g/dL (ref 3.5–5.0)
Alkaline Phosphatase: 60 U/L (ref 38–126)
Anion gap: 7 (ref 5–15)
BUN: 13 mg/dL (ref 6–20)
CO2: 27 mmol/L (ref 22–32)
Calcium: 9.1 mg/dL (ref 8.9–10.3)
Chloride: 103 mmol/L (ref 98–111)
Creatinine, Ser: 0.87 mg/dL (ref 0.44–1.00)
GFR, Estimated: >60 mL/min (ref >60)
Glucose, Bld: 89 mg/dL (ref 70–99)
Potassium: 3.5 mmol/L (ref 3.5–5.1)
Sodium: 137 mmol/L (ref 135–145)
Total Bilirubin: 0.3 mg/dL (ref 0.3–1.2)
Total Protein: 8.2 g/dL — ABNORMAL HIGH (ref 6.5–8.1)

## 2022-02-15 LAB — CBC
HCT: 34.9 % — ABNORMAL LOW (ref 36.0–46.0)
Hemoglobin: 11.4 g/dL — ABNORMAL LOW (ref 12.0–15.0)
MCH: 30.2 pg (ref 26.0–34.0)
MCHC: 32.7 g/dL (ref 30.0–36.0)
MCV: 92.3 fL (ref 80.0–100.0)
Platelets: 129 10*3/uL — ABNORMAL LOW (ref 150–400)
RBC: 3.78 MIL/uL — ABNORMAL LOW (ref 3.87–5.11)
RDW: 14.3 % (ref 11.5–15.5)
WBC: 6.9 10*3/uL (ref 4.0–10.5)
nRBC: 0 % (ref 0.0–0.2)

## 2022-02-15 LAB — SURGICAL PCR SCREEN
MRSA, PCR: NEGATIVE
Staphylococcus aureus: NEGATIVE

## 2022-02-15 LAB — HEMOGLOBIN A1C
Hgb A1c MFr Bld: 6 % — ABNORMAL HIGH (ref 4.8–5.6)
Mean Plasma Glucose: 125.5 mg/dL

## 2022-02-15 LAB — GLUCOSE, CAPILLARY: Glucose-Capillary: 98 mg/dL (ref 70–99)

## 2022-02-22 ENCOUNTER — Ambulatory Visit (INDEPENDENT_AMBULATORY_CARE_PROVIDER_SITE_OTHER): Payer: Medicare Other | Admitting: Psychiatry

## 2022-02-22 ENCOUNTER — Encounter (HOSPITAL_COMMUNITY): Payer: Self-pay | Admitting: Psychiatry

## 2022-02-22 VITALS — BP 150/88 | HR 94 | Temp 98.6°F | Ht 69.0 in | Wt 229.0 lb

## 2022-02-22 DIAGNOSIS — F411 Generalized anxiety disorder: Secondary | ICD-10-CM

## 2022-02-22 DIAGNOSIS — F3163 Bipolar disorder, current episode mixed, severe, without psychotic features: Secondary | ICD-10-CM | POA: Diagnosis not present

## 2022-02-22 DIAGNOSIS — F332 Major depressive disorder, recurrent severe without psychotic features: Secondary | ICD-10-CM

## 2022-02-22 DIAGNOSIS — F4310 Post-traumatic stress disorder, unspecified: Secondary | ICD-10-CM

## 2022-02-22 DIAGNOSIS — L299 Pruritus, unspecified: Secondary | ICD-10-CM

## 2022-02-22 MED ORDER — QUETIAPINE FUMARATE 50 MG PO TABS: 50.0000 mg | ORAL_TABLET | Freq: Every day | ORAL | 1 refills | Status: DC

## 2022-02-22 MED ORDER — HYDROXYZINE HCL 25 MG PO TABS: 25.0000 mg | ORAL_TABLET | Freq: Every day | ORAL | 1 refills | Status: DC | PRN
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
## 2022-02-26 ENCOUNTER — Encounter (HOSPITAL_COMMUNITY): Payer: Self-pay | Admitting: Orthopedic Surgery

## 2022-02-26 ENCOUNTER — Encounter (HOSPITAL_COMMUNITY)
Admission: RE | Disposition: A | Discharge status: Home / Self Care | Payer: Self-pay | Source: Home / Self Care | Attending: Orthopedic Surgery

## 2022-02-26 ENCOUNTER — Observation Stay (HOSPITAL_COMMUNITY)
Admission: RE | Admit: 2022-02-26 | Discharge: 2022-02-27 | Disposition: A | Discharge status: Home / Self Care | Payer: Medicare Other | Attending: Orthopedic Surgery | Admitting: Orthopedic Surgery

## 2022-02-26 ENCOUNTER — Ambulatory Visit (HOSPITAL_COMMUNITY): Payer: Medicare Other | Admitting: Physician Assistant

## 2022-02-26 ENCOUNTER — Other Ambulatory Visit: Payer: Self-pay

## 2022-02-26 ENCOUNTER — Ambulatory Visit (HOSPITAL_BASED_OUTPATIENT_CLINIC_OR_DEPARTMENT_OTHER): Payer: Medicare Other | Admitting: Certified Registered"

## 2022-02-26 DIAGNOSIS — Z8673 Personal history of transient ischemic attack (TIA), and cerebral infarction without residual deficits: Secondary | ICD-10-CM | POA: Diagnosis not present

## 2022-02-26 DIAGNOSIS — M1711 Unilateral primary osteoarthritis, right knee: Secondary | ICD-10-CM

## 2022-02-26 DIAGNOSIS — E039 Hypothyroidism, unspecified: Secondary | ICD-10-CM | POA: Diagnosis not present

## 2022-02-26 DIAGNOSIS — G8918 Other acute postprocedural pain: Secondary | ICD-10-CM | POA: Diagnosis not present

## 2022-02-26 DIAGNOSIS — I10 Essential (primary) hypertension: Secondary | ICD-10-CM | POA: Diagnosis not present

## 2022-02-26 DIAGNOSIS — Z7984 Long term (current) use of oral hypoglycemic drugs: Secondary | ICD-10-CM | POA: Diagnosis not present

## 2022-02-26 DIAGNOSIS — D638 Anemia in other chronic diseases classified elsewhere: Secondary | ICD-10-CM

## 2022-02-26 DIAGNOSIS — E119 Type 2 diabetes mellitus without complications: Secondary | ICD-10-CM | POA: Diagnosis not present

## 2022-02-26 DIAGNOSIS — R7303 Prediabetes: Secondary | ICD-10-CM

## 2022-02-26 DIAGNOSIS — M25561 Pain in right knee: Secondary | ICD-10-CM | POA: Diagnosis not present

## 2022-02-26 DIAGNOSIS — B20 Human immunodeficiency virus [HIV] disease: Secondary | ICD-10-CM | POA: Insufficient documentation

## 2022-02-26 DIAGNOSIS — Z96652 Presence of left artificial knee joint: Secondary | ICD-10-CM | POA: Diagnosis not present

## 2022-02-26 DIAGNOSIS — Z79899 Other long term (current) drug therapy: Secondary | ICD-10-CM | POA: Insufficient documentation

## 2022-02-26 DIAGNOSIS — F17210 Nicotine dependence, cigarettes, uncomplicated: Secondary | ICD-10-CM | POA: Diagnosis not present

## 2022-02-26 DIAGNOSIS — F17200 Nicotine dependence, unspecified, uncomplicated: Secondary | ICD-10-CM | POA: Diagnosis not present

## 2022-02-26 HISTORY — PX: TOTAL KNEE ARTHROPLASTY: SHX125

## 2022-02-26 LAB — GLUCOSE, CAPILLARY
Glucose-Capillary: 97 mg/dL (ref 70–99)
Glucose-Capillary: 101 mg/dL — ABNORMAL HIGH (ref 70–99)
Glucose-Capillary: 176 mg/dL — ABNORMAL HIGH (ref 70–99)
Glucose-Capillary: 166 mg/dL — ABNORMAL HIGH (ref 70–99)

## 2022-02-26 SURGERY — ARTHROPLASTY, KNEE, TOTAL
Anesthesia: Monitor Anesthesia Care | Site: Knee | Laterality: Right

## 2022-02-26 MED ORDER — DARUN-COBIC-EMTRICIT-TENOFAF 800-150-200-10 MG PO TABS
1.0000 | ORAL_TABLET | Freq: Every day | ORAL | Status: DC
  Filled 2022-02-26: qty 1

## 2022-02-26 MED ORDER — ACETAMINOPHEN 500 MG PO TABS: 1000.0000 mg | ORAL_TABLET | Freq: Once | ORAL | Status: DC | PRN

## 2022-02-26 MED ORDER — ONDANSETRON HCL 4 MG/2ML IJ SOLN
INTRAMUSCULAR | Status: AC
  Filled 2022-02-26: qty 2

## 2022-02-26 MED ORDER — FENTANYL CITRATE (PF) 100 MCG/2ML IJ SOLN
INTRAMUSCULAR | Status: AC
  Filled 2022-02-26: qty 2

## 2022-02-26 MED ORDER — POLYETHYLENE GLYCOL 3350 17 G PO PACK: 17.0000 g | PACK | Freq: Every day | ORAL | Status: DC | PRN

## 2022-02-26 MED ORDER — ACETAMINOPHEN 10 MG/ML IV SOLN: 1000.0000 mg | Freq: Once | INTRAVENOUS | Status: DC | PRN

## 2022-02-26 MED ORDER — LORATADINE 10 MG PO TABS
10.0000 mg | ORAL_TABLET | Freq: Every evening | ORAL | Status: DC
Start: 2022-02-26 — End: 2022-02-27
  Administered 2022-02-26: 10 mg via ORAL
  Filled 2022-02-26: qty 1

## 2022-02-26 MED ORDER — TRAMADOL HCL 50 MG PO TABS
ORAL_TABLET | ORAL | Status: AC
  Filled 2022-02-26: qty 1

## 2022-02-26 MED ORDER — FENTANYL CITRATE PF 50 MCG/ML IJ SOSY
25.0000 ug | PREFILLED_SYRINGE | INTRAMUSCULAR | Status: DC | PRN
  Administered 2022-02-26: 50 ug via INTRAVENOUS

## 2022-02-26 MED ORDER — DEXAMETHASONE SODIUM PHOSPHATE 10 MG/ML IJ SOLN
8.0000 mg | Freq: Once | INTRAMUSCULAR | Status: AC
  Administered 2022-02-26: 8 mg via INTRAVENOUS

## 2022-02-26 MED ORDER — FENTANYL CITRATE PF 50 MCG/ML IJ SOSY
PREFILLED_SYRINGE | INTRAMUSCULAR | Status: AC
  Administered 2022-02-26: 50 ug via INTRAVENOUS
  Filled 2022-02-26: qty 1

## 2022-02-26 MED ORDER — SODIUM CHLORIDE 0.9 % IR SOLN
Status: DC | PRN
  Administered 2022-02-26 (×2): 1000 mL

## 2022-02-26 MED ORDER — BUPIVACAINE IN DEXTROSE 0.75-8.25 % IT SOLN
INTRATHECAL | Status: DC | PRN
  Administered 2022-02-26: 1.8 mL via INTRATHECAL

## 2022-02-26 MED ORDER — ACETAMINOPHEN 160 MG/5ML PO SOLN: 1000.0000 mg | Freq: Once | ORAL | Status: DC | PRN

## 2022-02-26 MED ORDER — LEVETIRACETAM 500 MG PO TABS
500.0000 mg | ORAL_TABLET | Freq: Two times a day (BID) | ORAL | Status: DC
  Administered 2022-02-26 – 2022-02-27 (×2): 500 mg via ORAL
  Filled 2022-02-26 (×2): qty 1

## 2022-02-26 MED ORDER — DEXAMETHASONE SODIUM PHOSPHATE 10 MG/ML IJ SOLN
INTRAMUSCULAR | Status: AC
  Filled 2022-02-26: qty 1

## 2022-02-26 MED ORDER — MIDAZOLAM HCL 2 MG/2ML IJ SOLN
INTRAMUSCULAR | Status: AC
  Filled 2022-02-26: qty 2

## 2022-02-26 MED ORDER — FENTANYL CITRATE PF 50 MCG/ML IJ SOSY
PREFILLED_SYRINGE | INTRAMUSCULAR | Status: AC
  Filled 2022-02-26: qty 1

## 2022-02-26 MED ORDER — LACTATED RINGERS IV SOLN: INTRAVENOUS | Status: DC

## 2022-02-26 MED ORDER — CYCLOBENZAPRINE HCL 10 MG PO TABS
10.0000 mg | ORAL_TABLET | Freq: Three times a day (TID) | ORAL | Status: DC | PRN
Start: 2022-02-26 — End: 2022-02-27
  Administered 2022-02-26: 10 mg via ORAL
  Filled 2022-02-26: qty 1

## 2022-02-26 MED ORDER — MENTHOL 3 MG MT LOZG: 1.0000 | LOZENGE | OROMUCOSAL | Status: DC | PRN

## 2022-02-26 MED ORDER — FENTANYL CITRATE PF 50 MCG/ML IJ SOSY: 100.0000 ug | PREFILLED_SYRINGE | Freq: Once | INTRAMUSCULAR | Status: AC

## 2022-02-26 MED ORDER — METOCLOPRAMIDE HCL 5 MG/ML IJ SOLN: 5.0000 mg | Freq: Three times a day (TID) | INTRAMUSCULAR | Status: DC | PRN

## 2022-02-26 MED ORDER — HYDROCHLOROTHIAZIDE 25 MG PO TABS
25.0000 mg | ORAL_TABLET | Freq: Every day | ORAL | Status: DC
  Administered 2022-02-27: 25 mg via ORAL
  Filled 2022-02-26: qty 1

## 2022-02-26 MED ORDER — OXYCODONE HCL 5 MG PO TABS
5.0000 mg | ORAL_TABLET | Freq: Once | ORAL | Status: AC | PRN
  Administered 2022-02-26: 5 mg via ORAL

## 2022-02-26 MED ORDER — ASPIRIN 325 MG PO TBEC
325.0000 mg | DELAYED_RELEASE_TABLET | Freq: Two times a day (BID) | ORAL | Status: DC
  Filled 2022-02-26: qty 1

## 2022-02-26 MED ORDER — PHENYLEPHRINE 80 MCG/ML (10ML) SYRINGE FOR IV PUSH (FOR BLOOD PRESSURE SUPPORT)
PREFILLED_SYRINGE | INTRAVENOUS | Status: AC
  Filled 2022-02-26: qty 10

## 2022-02-26 MED ORDER — MIDAZOLAM HCL 2 MG/2ML IJ SOLN
INTRAMUSCULAR | Status: AC
  Administered 2022-02-26: 2 mg via INTRAVENOUS
  Filled 2022-02-26: qty 2

## 2022-02-26 MED ORDER — ACETAMINOPHEN 500 MG PO TABS
1000.0000 mg | ORAL_TABLET | Freq: Four times a day (QID) | ORAL | Status: DC
  Administered 2022-02-26 – 2022-02-27 (×3): 1000 mg via ORAL
  Filled 2022-02-26 (×3): qty 2

## 2022-02-26 MED ORDER — ACETAMINOPHEN 10 MG/ML IV SOLN
1000.0000 mg | Freq: Four times a day (QID) | INTRAVENOUS | Status: DC
  Administered 2022-02-26: 1000 mg via INTRAVENOUS
  Filled 2022-02-26: qty 100

## 2022-02-26 MED ORDER — SODIUM CHLORIDE (PF) 0.9 % IJ SOLN
INTRAMUSCULAR | Status: AC
  Filled 2022-02-26: qty 50

## 2022-02-26 MED ORDER — PROPOFOL 500 MG/50ML IV EMUL
INTRAVENOUS | Status: DC | PRN
  Administered 2022-02-26: 50 ug/kg/min via INTRAVENOUS

## 2022-02-26 MED ORDER — INSULIN ASPART 100 UNIT/ML IJ SOLN
0.0000 [IU] | Freq: Three times a day (TID) | INTRAMUSCULAR | Status: DC
  Administered 2022-02-26: 3 [IU] via SUBCUTANEOUS
  Administered 2022-02-27: 2 [IU] via SUBCUTANEOUS

## 2022-02-26 MED ORDER — ONDANSETRON HCL 4 MG/2ML IJ SOLN
INTRAMUSCULAR | Status: DC | PRN
  Administered 2022-02-26: 4 mg via INTRAVENOUS

## 2022-02-26 MED ORDER — METOCLOPRAMIDE HCL 5 MG PO TABS: 5.0000 mg | ORAL_TABLET | Freq: Three times a day (TID) | ORAL | Status: DC | PRN

## 2022-02-26 MED ORDER — LEVOTHYROXINE SODIUM 50 MCG PO TABS
175.0000 ug | ORAL_TABLET | Freq: Every day | ORAL | Status: DC
  Administered 2022-02-27: 175 ug via ORAL
  Filled 2022-02-26: qty 1

## 2022-02-26 MED ORDER — TRAMADOL HCL 50 MG PO TABS
50.0000 mg | ORAL_TABLET | Freq: Four times a day (QID) | ORAL | Status: DC
  Administered 2022-02-26 – 2022-02-27 (×4): 50 mg via ORAL
  Filled 2022-02-26 (×3): qty 1

## 2022-02-26 MED ORDER — BUPIVACAINE LIPOSOME 1.3 % IJ SUSP: 20.0000 mL | Freq: Once | INTRAMUSCULAR | Status: DC

## 2022-02-26 MED ORDER — SODIUM CHLORIDE (PF) 0.9 % IJ SOLN
INTRAMUSCULAR | Status: AC
  Filled 2022-02-26: qty 10

## 2022-02-26 MED ORDER — DOCUSATE SODIUM 100 MG PO CAPS
100.0000 mg | ORAL_CAPSULE | Freq: Two times a day (BID) | ORAL | Status: DC
  Administered 2022-02-26 – 2022-02-27 (×2): 100 mg via ORAL
  Filled 2022-02-26 (×2): qty 1

## 2022-02-26 MED ORDER — POVIDONE-IODINE 10 % EX SWAB
2.0000 | Freq: Once | CUTANEOUS | Status: AC
  Administered 2022-02-26: 2 via TOPICAL

## 2022-02-26 MED ORDER — BUPIVACAINE LIPOSOME 1.3 % IJ SUSP
INTRAMUSCULAR | Status: AC
Start: 2022-02-26 — End: ?
  Filled 2022-02-26: qty 20

## 2022-02-26 MED ORDER — FENTANYL CITRATE PF 50 MCG/ML IJ SOSY
PREFILLED_SYRINGE | INTRAMUSCULAR | Status: AC
  Administered 2022-02-26: 100 ug via INTRAVENOUS
  Filled 2022-02-26: qty 2

## 2022-02-26 MED ORDER — PREGABALIN 25 MG PO CAPS
25.0000 mg | ORAL_CAPSULE | Freq: Three times a day (TID) | ORAL | Status: DC
  Administered 2022-02-26 – 2022-02-27 (×2): 25 mg via ORAL
  Filled 2022-02-26 (×2): qty 1

## 2022-02-26 MED ORDER — SODIUM CHLORIDE (PF) 0.9 % IJ SOLN
INTRAMUSCULAR | Status: DC | PRN
  Administered 2022-02-26: 50 mL

## 2022-02-26 MED ORDER — ONDANSETRON HCL 4 MG PO TABS: 4.0000 mg | ORAL_TABLET | Freq: Four times a day (QID) | ORAL | Status: DC | PRN

## 2022-02-26 MED ORDER — ONDANSETRON HCL 4 MG/2ML IJ SOLN: 4.0000 mg | Freq: Four times a day (QID) | INTRAMUSCULAR | Status: DC | PRN

## 2022-02-26 MED ORDER — VANCOMYCIN HCL 1500 MG/300ML IV SOLN
1500.0000 mg | INTRAVENOUS | Status: AC
  Administered 2022-02-26: 1500 mg via INTRAVENOUS
  Filled 2022-02-26: qty 300

## 2022-02-26 MED ORDER — OXYCODONE HCL 5 MG PO TABS
ORAL_TABLET | ORAL | Status: AC
  Filled 2022-02-26: qty 1

## 2022-02-26 MED ORDER — MIDAZOLAM HCL 2 MG/2ML IJ SOLN: 2.0000 mg | Freq: Once | INTRAMUSCULAR | Status: AC

## 2022-02-26 MED ORDER — SODIUM CHLORIDE 0.9 % IV SOLN: INTRAVENOUS | Status: DC

## 2022-02-26 MED ORDER — INSULIN ASPART 100 UNIT/ML IJ SOLN: 0.0000 [IU] | Freq: Every day | INTRAMUSCULAR | Status: DC

## 2022-02-26 MED ORDER — FLEET ENEMA 7-19 GM/118ML RE ENEM: 1.0000 | ENEMA | Freq: Once | RECTAL | Status: DC | PRN

## 2022-02-26 MED ORDER — OXYCODONE HCL 5 MG PO TABS
5.0000 mg | ORAL_TABLET | ORAL | Status: DC | PRN
  Administered 2022-02-26 – 2022-02-27 (×3): 10 mg via ORAL
  Filled 2022-02-26 (×3): qty 2

## 2022-02-26 MED ORDER — LORATADINE 10 MG PO TABS: 10.0000 mg | ORAL_TABLET | Freq: Every evening | ORAL | Status: DC

## 2022-02-26 MED ORDER — PROPOFOL 1000 MG/100ML IV EMUL
INTRAVENOUS | Status: AC
  Filled 2022-02-26: qty 100

## 2022-02-26 MED ORDER — CHLORHEXIDINE GLUCONATE 0.12 % MT SOLN: 15.0000 mL | Freq: Once | OROMUCOSAL | Status: AC

## 2022-02-26 MED ORDER — OXYCODONE HCL 5 MG/5ML PO SOLN: 5.0000 mg | Freq: Once | ORAL | Status: AC | PRN

## 2022-02-26 MED ORDER — DIPHENHYDRAMINE HCL 12.5 MG/5ML PO ELIX: 12.5000 mg | ORAL_SOLUTION | ORAL | Status: DC | PRN

## 2022-02-26 MED ORDER — PHENOL 1.4 % MT LIQD: 1.0000 | OROMUCOSAL | Status: DC | PRN

## 2022-02-26 MED ORDER — HYDROXYZINE HCL 25 MG PO TABS: 25.0000 mg | ORAL_TABLET | Freq: Every day | ORAL | Status: DC | PRN

## 2022-02-26 MED ORDER — ORAL CARE MOUTH RINSE
15.0000 mL | Freq: Once | OROMUCOSAL | Status: AC
  Administered 2022-02-26: 15 mL via OROMUCOSAL

## 2022-02-26 MED ORDER — TRANEXAMIC ACID-NACL 1000-0.7 MG/100ML-% IV SOLN
1000.0000 mg | INTRAVENOUS | Status: AC
  Administered 2022-02-26: 1000 mg via INTRAVENOUS
  Filled 2022-02-26: qty 100

## 2022-02-26 MED ORDER — VANCOMYCIN HCL IN DEXTROSE 1-5 GM/200ML-% IV SOLN
1000.0000 mg | Freq: Two times a day (BID) | INTRAVENOUS | Status: AC
  Administered 2022-02-26: 1000 mg via INTRAVENOUS
  Filled 2022-02-26: qty 200

## 2022-02-26 MED ORDER — QUETIAPINE FUMARATE 50 MG PO TABS
50.0000 mg | ORAL_TABLET | Freq: Every day | ORAL | Status: DC
  Administered 2022-02-26: 50 mg via ORAL
  Filled 2022-02-26: qty 1

## 2022-02-26 MED ORDER — HYOSCYAMINE SULFATE 0.125 MG SL SUBL: 0.1250 mg | SUBLINGUAL_TABLET | SUBLINGUAL | Status: DC | PRN

## 2022-02-26 MED ORDER — BISACODYL 10 MG RE SUPP: 10.0000 mg | Freq: Every day | RECTAL | Status: DC | PRN

## 2022-02-26 MED ORDER — ROPIVACAINE HCL 7.5 MG/ML IJ SOLN
INTRAMUSCULAR | Status: DC | PRN
  Administered 2022-02-26: 20 mL via PERINEURAL

## 2022-02-26 MED ORDER — DULOXETINE HCL 60 MG PO CPEP
60.0000 mg | ORAL_CAPSULE | Freq: Every day | ORAL | Status: DC
  Administered 2022-02-27: 60 mg via ORAL
  Filled 2022-02-26: qty 1

## 2022-02-26 MED ORDER — MORPHINE SULFATE (PF) 2 MG/ML IV SOLN
1.0000 mg | INTRAVENOUS | Status: DC | PRN
  Administered 2022-02-26: 2 mg via INTRAVENOUS
  Filled 2022-02-26: qty 1

## 2022-02-26 MED ORDER — ALBUTEROL SULFATE (2.5 MG/3ML) 0.083% IN NEBU: 2.5000 mg | INHALATION_SOLUTION | Freq: Four times a day (QID) | RESPIRATORY_TRACT | Status: DC | PRN

## 2022-02-26 MED ORDER — BUPIVACAINE LIPOSOME 1.3 % IJ SUSP
INTRAMUSCULAR | Status: DC | PRN
  Administered 2022-02-26: 20 mL

## 2022-02-26 SURGICAL SUPPLY — 58 items
ATTUNE PS FEM RT SZ 6 CEM KNEE (Femur) ×1 IMPLANT
ATTUNE PSRP INSR SZ6 7 KNEE (Insert) ×1 IMPLANT
BAG COUNTER SPONGE SURGICOUNT (BAG) ×1 IMPLANT
BAG SPEC THK2 15X12 ZIP CLS (MISCELLANEOUS) ×1
BAG SPNG CNTER NS LX DISP (BAG) ×1
BAG ZIPLOCK 12X15 (MISCELLANEOUS) ×3 IMPLANT
BASE TIBIAL ROT PLAT SZ 5 KNEE (Knees) IMPLANT
BLADE SAG 18X100X1.27 (BLADE) ×3 IMPLANT
BLADE SAW SGTL 11.0X1.19X90.0M (BLADE) ×3 IMPLANT
BNDG ELASTIC 6X5.8 VLCR STR LF (GAUZE/BANDAGES/DRESSINGS) ×3 IMPLANT
BOWL SMART MIX CTS (DISPOSABLE) ×3 IMPLANT
BSPLAT TIB 5 CMNT ROT PLAT STR (Knees) ×1 IMPLANT
CEMENT HV SMART SET (Cement) ×6 IMPLANT
COVER SURGICAL LIGHT HANDLE (MISCELLANEOUS) ×3 IMPLANT
CUFF TOURN SGL QUICK 34 (TOURNIQUET CUFF) ×2
CUFF TRNQT CYL 34X4.125X (TOURNIQUET CUFF) ×2 IMPLANT
DRAPE INCISE IOBAN 66X45 STRL (DRAPES) ×3 IMPLANT
DRAPE U-SHAPE 47X51 STRL (DRAPES) ×3 IMPLANT
DRSG AQUACEL AG ADV 3.5X10 (GAUZE/BANDAGES/DRESSINGS) ×3 IMPLANT
DURAPREP 26ML APPLICATOR (WOUND CARE) ×3 IMPLANT
ELECT REM PT RETURN 15FT ADLT (MISCELLANEOUS) ×3 IMPLANT
GLOVE BIO SURGEON STRL SZ 6.5 (GLOVE) ×1 IMPLANT
GLOVE BIO SURGEON STRL SZ7.5 (GLOVE) IMPLANT
GLOVE BIO SURGEON STRL SZ8 (GLOVE) ×3 IMPLANT
GLOVE BIOGEL PI IND STRL 6.5 (GLOVE) IMPLANT
GLOVE BIOGEL PI IND STRL 7.0 (GLOVE) IMPLANT
GLOVE BIOGEL PI IND STRL 8 (GLOVE) ×2 IMPLANT
GLOVE BIOGEL PI INDICATOR 6.5 (GLOVE)
GLOVE BIOGEL PI INDICATOR 7.0 (GLOVE) ×1
GLOVE BIOGEL PI INDICATOR 8 (GLOVE) ×1
GOWN STRL REUS W/ TWL LRG LVL3 (GOWN DISPOSABLE) ×2 IMPLANT
GOWN STRL REUS W/ TWL XL LVL3 (GOWN DISPOSABLE) IMPLANT
GOWN STRL REUS W/TWL LRG LVL3 (GOWN DISPOSABLE) ×2
GOWN STRL REUS W/TWL XL LVL3 (GOWN DISPOSABLE)
HANDPIECE INTERPULSE COAX TIP (DISPOSABLE) ×2
HOLDER FOLEY CATH W/STRAP (MISCELLANEOUS) ×1 IMPLANT
IMMOBILIZER KNEE 20 (SOFTGOODS) ×2
IMMOBILIZER KNEE 20 THIGH 36 (SOFTGOODS) ×2 IMPLANT
KIT TURNOVER KIT A (KITS) ×1 IMPLANT
MANIFOLD NEPTUNE II (INSTRUMENTS) ×3 IMPLANT
NS IRRIG 1000ML POUR BTL (IV SOLUTION) ×3 IMPLANT
PACK TOTAL KNEE CUSTOM (KITS) ×3 IMPLANT
PADDING CAST COTTON 6X4 STRL (CAST SUPPLIES) ×5 IMPLANT
PATELLA MEDIAL ATTUN 35MM KNEE (Knees) ×1 IMPLANT
PROTECTOR NERVE ULNAR (MISCELLANEOUS) ×3 IMPLANT
SET HNDPC FAN SPRY TIP SCT (DISPOSABLE) ×2 IMPLANT
SPIKE FLUID TRANSFER (MISCELLANEOUS) ×3 IMPLANT
STRIP CLOSURE SKIN 1/2X4 (GAUZE/BANDAGES/DRESSINGS) ×6 IMPLANT
SUT MNCRL AB 4-0 PS2 18 (SUTURE) ×3 IMPLANT
SUT STRATAFIX 0 PDS 27 VIOLET (SUTURE) ×2
SUT VIC AB 2-0 CT1 27 (SUTURE) ×6
SUT VIC AB 2-0 CT1 TAPERPNT 27 (SUTURE) ×6 IMPLANT
SUTURE STRATFX 0 PDS 27 VIOLET (SUTURE) ×2 IMPLANT
TIBIAL BASE ROT PLAT SZ 5 KNEE (Knees) ×2 IMPLANT
TRAY FOLEY MTR SLVR 16FR STAT (SET/KITS/TRAYS/PACK) ×3 IMPLANT
TUBE SUCTION HIGH CAP CLEAR NV (SUCTIONS) ×3 IMPLANT
WATER STERILE IRR 1000ML POUR (IV SOLUTION) ×6 IMPLANT
WRAP KNEE MAXI GEL POST OP (GAUZE/BANDAGES/DRESSINGS) ×3 IMPLANT

## 2022-02-26 NOTE — Plan of Care (Signed)
  Problem: Activity: Goal: Ability to avoid complications of mobility impairment will improve Outcome: Progressing Goal: Range of joint motion will improve Outcome: Progressing   Problem: Pain Management: Goal: Pain level will decrease with appropriate interventions Outcome: Progressing   

## 2022-02-26 NOTE — Discharge Instructions (Addendum)
Cheryl Arabian, MD Total Joint Specialist [facility] [address], [address] [place], [place] [address] [phone]  TOTAL KNEE REPLACEMENT POSTOPERATIVE DIRECTIONS    Knee Rehabilitation, Guidelines Following Surgery  Results after knee surgery are often greatly improved when you follow the exercise, range of motion and muscle strengthening exercises prescribed by your doctor. Safety measures are also important to protect the knee from further injury. If any of these exercises cause you to have increased pain or swelling in your knee joint, decrease the amount until you are comfortable again and slowly increase them. If you have problems or questions, call your caregiver or physical therapist for advice.   BLOOD CLOT PREVENTION Take a 325 mg Aspirin two times a day for three weeks following surgery. Then take an 81 mg Aspirin once a day for three weeks. Then discontinue Aspirin. You may resume your vitamins/supplements upon discharge from the hospital. Do not take any NSAIDs (Advil, Aleve, Ibuprofen, Meloxicam, etc.) until you have discontinued the 325 mg Aspirin.  PREGABALIN INSTRUCTIONS Take one 25 mg capsule by mouth three times a day for two weeks. Then resume one 25 mg capsule twice a day.  HOME CARE INSTRUCTIONS  Remove items at home which could result in a fall. This includes throw rugs or furniture in walking pathways.  ICE to the affected knee as much as tolerated. Icing helps control swelling. If the swelling is well controlled you will be more comfortable and rehab easier. Continue to use ice on the knee for pain and swelling from surgery. You may notice swelling that will progress down to the foot and ankle. This is normal after surgery. Elevate the leg when you are not up walking on it.    Continue to use the breathing machine which will help keep your temperature down. It is common for your temperature to cycle up and down following surgery, especially  at night when you are not up moving around and exerting yourself. The breathing machine keeps your lungs expanded and your temperature down. Do not place pillow under the operative knee, focus on keeping the knee straight while resting  DIET You may resume your previous home diet once you are discharged from the hospital.  DRESSING / WOUND CARE / SHOWERING Keep your bulky bandage on for 2 days. On the third post-operative day you may remove the Ace bandage and gauze. There is a waterproof adhesive bandage on your skin which will stay in place until your first follow-up appointment. Once you remove this you will not need to place another bandage You may begin showering 3 days following surgery, but do not submerge the incision under water.  ACTIVITY For the first 5 days, the key is rest and control of pain and swelling Do your home exercises twice a day starting on post-operative day 3. On the days you go to physical therapy, just do the home exercises once that day. You should rest, ice and elevate the leg for 50 minutes out of every hour. Get up and walk/stretch for 10 minutes per hour. After 5 days you can increase your activity slowly as tolerated. Walk with your walker as instructed. Use the walker until you are comfortable transitioning to a cane. Walk with the cane in the opposite hand of the operative leg. You may discontinue the cane once you are comfortable and walking steadily. Avoid periods of inactivity such as sitting longer than an hour when not asleep. This helps prevent blood clots.  You may  discontinue the knee immobilizer once you are able to perform a straight leg raise while lying down. You may resume a sexual relationship in one month or when given the OK by your doctor.  You may return to work once you are cleared by your doctor.  Do not drive a car for 6 weeks or until released by your surgeon.  Do not drive while taking narcotics.  TED HOSE STOCKINGS Wear the elastic  stockings on both legs for three weeks following surgery during the day. You may remove them at night for sleeping.  WEIGHT BEARING Weight bearing as tolerated with assist device (walker, cane, etc) as directed, use it as long as suggested by your surgeon or therapist, typically at least 4-6 weeks.  POSTOPERATIVE CONSTIPATION PROTOCOL Constipation - defined medically as fewer than three stools per week and severe constipation as less than one stool per week.  One of the most common issues patients have following surgery is constipation.  Even if you have a regular bowel pattern at home, your normal regimen is likely to be disrupted due to multiple reasons following surgery.  Combination of anesthesia, postoperative narcotics, change in appetite and fluid intake all can affect your bowels.  In order to avoid complications following surgery, here are some recommendations in order to help you during your recovery period.  Colace (docusate) - Pick up an over-the-counter form of Colace or another stool softener and take twice a day as long as you are requiring postoperative pain medications.  Take with a full glass of water daily.  If you experience loose stools or diarrhea, hold the colace until you stool forms back up. If your symptoms do not get better within 1 week or if they get worse, check with your doctor. Dulcolax (bisacodyl) - Pick up over-the-counter and take as directed by the product packaging as needed to assist with the movement of your bowels.  Take with a full glass of water.  Use this product as needed if not relieved by Colace only.  MiraLax (polyethylene glycol) - Pick up over-the-counter to have on hand. MiraLax is a solution that will increase the amount of water in your bowels to assist with bowel movements.  Take as directed and can mix with a glass of water, juice, soda, coffee, or tea. Take if you go more than two days without a movement. Do not use MiraLax more than once per day.  Call your doctor if you are still constipated or irregular after using this medication for 7 days in a row.  If you continue to have problems with postoperative constipation, please contact the office for further assistance and recommendations.  If you experience "the worst abdominal pain ever" or develop nausea or vomiting, please contact the office immediatly for further recommendations for treatment.  ITCHING If you experience itching with your medications, try taking only a single pain pill, or even half a pain pill at a time.  You can also use Benadryl over the counter for itching or also to help with sleep.   MEDICATIONS See your medication summary on the "After Visit Summary" that the nursing staff will review with you prior to discharge.  You may have some home medications which will be placed on hold until you complete the course of blood thinner medication.  It is important for you to complete the blood thinner medication as prescribed by your surgeon.  Continue your approved medications as instructed at time of discharge.  PRECAUTIONS If you experience chest  pain or shortness of breath - call 911 immediately for transfer to the hospital emergency department.  If you develop a fever greater that 101 F, purulent drainage from wound, increased redness or drainage from wound, foul odor from the wound/dressing, or calf pain - CONTACT YOUR SURGEON.                                                   FOLLOW-UP APPOINTMENTS Make sure you keep all of your appointments after your operation with your surgeon and caregivers. You should call the office at the above phone number and make an appointment for approximately two weeks after the date of your surgery or on the date instructed by your surgeon outlined in the "After Visit Summary".  RANGE OF MOTION AND STRENGTHENING EXERCISES  Rehabilitation of the knee is important following a knee injury or an operation. After just a few days of  immobilization, the muscles of the thigh which control the knee become weakened and shrink (atrophy). Knee exercises are designed to build up the tone and strength of the thigh muscles and to improve knee motion. Often times heat used for twenty to thirty minutes before working out will loosen up your tissues and help with improving the range of motion but do not use heat for the first two weeks following surgery. These exercises can be done on a training (exercise) mat, on the floor, on a table or on a bed. Use what ever works the best and is most comfortable for you Knee exercises include:  Leg Lifts - While your knee is still immobilized in a splint or cast, you can do straight leg raises. Lift the leg to 60 degrees, hold for 3 sec, and slowly lower the leg. Repeat 10-20 times 2-3 times daily. Perform this exercise against resistance later as your knee gets better.  Quad and Hamstring Sets - Tighten up the muscle on the front of the thigh (Quad) and hold for 5-10 sec. Repeat this 10-20 times hourly. Hamstring sets are done by pushing the foot backward against an object and holding for 5-10 sec. Repeat as with quad sets.  Leg Slides: Lying on your back, slowly slide your foot toward your buttocks, bending your knee up off the floor (only go as far as is comfortable). Then slowly slide your foot back down until your leg is flat on the floor again. Angel Wings: Lying on your back spread your legs to the side as far apart as you can without causing discomfort.  A rehabilitation program following serious knee injuries can speed recovery and prevent re-injury in the future due to weakened muscles. Contact your doctor or a physical therapist for more information on knee rehabilitation.   POST-OPERATIVE OPIOID TAPER INSTRUCTIONS: It is important to wean off of your opioid medication as soon as possible. If you do not need pain medication after your surgery it is ok to stop day one. Opioids include: Codeine,  Hydrocodone(Norco, Vicodin), Oxycodone(Percocet, oxycontin) and hydromorphone amongst others.  Long term and even short term use of opiods can cause: Increased pain response Dependence Constipation Depression Respiratory depression And more.  Withdrawal symptoms can include Flu like symptoms Nausea, vomiting And more Techniques to manage these symptoms Hydrate well Eat regular healthy meals Stay active Use relaxation techniques(deep breathing, meditating, yoga) Do Not substitute Alcohol to help with tapering  If you have been on opioids for less than two weeks and do not have pain than it is ok to stop all together.  Plan to wean off of opioids This plan should start within one week post op of your joint replacement. Maintain the same interval or time between taking each dose and first decrease the dose.  Cut the total daily intake of opioids by one tablet each day Next start to increase the time between doses. The last dose that should be eliminated is the evening dose.   IF YOU ARE TRANSFERRED TO A SKILLED REHAB FACILITY If the patient is transferred to a skilled rehab facility following release from the hospital, a list of the current medications will be sent to the facility for the patient to continue.  When discharged from the skilled rehab facility, please have the facility set up the patient's [place] prior to being released. Also, the skilled facility will be responsible for providing the patient with their medications at time of release from the facility to include their pain medication, the muscle relaxants, and their blood thinner medication. If the patient is still at the rehab facility at time of the two week follow up appointment, the skilled rehab facility will also need to assist the patient in arranging follow up appointment in our office and any transportation needs.  MAKE SURE YOU:  Understand these instructions.  Get help right away if you are  not doing well or get worse.   DENTAL ANTIBIOTICS:  In most cases prophylactic antibiotics for Dental procdeures after total joint surgery are not necessary.  Exceptions are as follows:  1. History of prior total joint infection  2. Severely immunocompromised (Organ Transplant, cancer chemotherapy, Rheumatoid biologic medications such as [place])  3. Poorly controlled diabetes (A1C &gt; 8.0, blood glucose over 200)  If you have one of these conditions, contact your surgeon for an antibiotic prescription, prior to your dental procedure.    Pick up stool softner and laxative for home use following surgery while on pain medications. Do not submerge incision under water. Please use good hand washing techniques while changing dressing each day. May shower starting three days after surgery. Please use a clean towel to pat the incision dry following showers. Continue to use ice for pain and swelling after surgery. Do not use any lotions or creams on the incision until instructed by your surgeon.

## 2022-02-26 NOTE — Transfer of Care (Signed)
Immediate Anesthesia Transfer of Care Note  Patient: Cheryl Thompson  Procedure(s) Performed: TOTAL KNEE ARTHROPLASTY (Right: Knee)  Patient Location: PACU  Anesthesia Type:Spinal  Level of Consciousness: awake  Airway & Oxygen Therapy: Patient Spontanous Breathing  Post-op Assessment: Report given to RN  Post vital signs: stable  Last Vitals:  Vitals Value Taken Time  BP 126/92 02/26/22 1345  Temp    Pulse 80 02/26/22 1348  Resp 13 02/26/22 1348  SpO2 91 % 02/26/22 1348  Vitals shown include unvalidated device data.  Last Pain:  Vitals:   02/26/22 0942  TempSrc: Oral  PainSc:       Patients Stated Pain Goal: 2 (73/56/70 1410)  Complications: No notable events documented.

## 2022-02-26 NOTE — Progress Notes (Signed)
Orthopedic Tech Progress Note Patient Details:  Cheryl Thompson 08-05-1965 [id number]  CPM Right Knee Right Knee Flexion (Degrees): 40 Right Knee Extension (Degrees): 10  Post Interventions Patient Tolerated: Forestine Na 02/26/2022, 2:49 PM

## 2022-02-26 NOTE — Anesthesia Preprocedure Evaluation (Signed)
Anesthesia Evaluation  Patient identified by MRN, date of birth, ID band Patient awake    Reviewed: Allergy & Precautions, NPO status , Patient's Chart, lab work & pertinent test results  History of Anesthesia Complications Negative for: history of anesthetic complications  Airway Mallampati: II  TM Distance: >3 FB Neck ROM: Full    Dental  (+) Teeth Intact, Dental Advisory Given   Pulmonary neg shortness of breath, neg sleep apnea, neg COPD, neg recent URI, Current SmokerPatient did not abstain from smoking.,    breath sounds clear to auscultation       Cardiovascular hypertension, Pt. 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  Neuromuscular disease CVA, No Residual Symptoms    GI/Hepatic hiatal hernia, (+)     substance abuse  , Hepatitis -, CTreated   H/o SA clean since 2016   Endo/Other  Hypothyroidism Lab Results      Component                Value               Date                      HGBA1C                   6.0 (H)             02/15/2022             Renal/GU negative Renal ROSLab Results      Component                Value               Date                      CREATININE               0.87                02/15/2022                Musculoskeletal  (+) Arthritis ,   Abdominal   Peds  Hematology  (+) Blood dyscrasia, anemia , HIV, Lab Results      Component                Value               Date                      WBC                      6.9                 02/15/2022                HGB                      11.4 (L)            02/15/2022                HCT  34.9 (L)            02/15/2022                MCV                      92.3                02/15/2022                PLT                      129 (L)              02/15/2022           Lab Results      Component                Value               Date                      INR                      1.0                 11/11/2020                INR                      1.0                 11/28/2017                INR                      1.12                05/31/2013              Anesthesia Other Findings   Reproductive/Obstetrics                             Anesthesia Physical Anesthesia Plan  ASA: 3  Anesthesia Plan: MAC, Regional and Spinal   Post-op Pain Management: Ofirmev IV (intra-op)*   Induction: Intravenous  PONV Risk Score and Plan: 1 and Propofol infusion and Treatment may vary due to age or medical condition  Airway Management Planned: Nasal Cannula and Natural Airway  Additional Equipment: None  Intra-op Plan:   Post-operative Plan:   Informed Consent: I have reviewed the patients History and Physical, chart, labs and discussed the procedure including the risks, benefits and alternatives for the proposed anesthesia with the patient or authorized representative who has indicated his/her understanding and acceptance.     Dental advisory given  Plan Discussed with: CRNA  Anesthesia Plan Comments:         Anesthesia Quick Evaluation

## 2022-02-26 NOTE — Progress Notes (Signed)
Orthopedic Tech Progress Note Patient Details:  Cheryl Thompson 10-25-1965 [id number]  CPM Right Knee CPM Right Knee: Off Right Knee Flexion (Degrees): 40 Right Knee Extension (Degrees): 10  Post Interventions Patient Tolerated: Fair CPM removed by PT around [place] 02/26/2022, 6:22 PM

## 2022-02-26 NOTE — Anesthesia Procedure Notes (Signed)
Anesthesia Regional Block: Adductor canal block   Pre-Anesthetic Checklist: , timeout performed,  Correct Patient, Correct Site, Correct Laterality,  Correct Procedure, Correct Position, site marked,  Risks and benefits discussed,  Surgical consent,  Pre-op evaluation,  At surgeon's request and post-op pain management  Laterality: Right and Lower  Prep: chloraprep       Needles:  Injection technique: Single-shot      Needle Length: 9cm  Needle Gauge: 22     Additional Needles: Arrow StimuQuik ECHO Echogenic Stimulating PNB Needle  Procedures:,,,, ultrasound used (permanent image in chart),,    Narrative:  Start time: 02/26/2022 11:18 AM End time: 02/26/2022 11:22 AM Injection made incrementally with aspirations every 5 mL.  Performed by: Personally  Anesthesiologist: Oleta Mouse, MD

## 2022-02-26 NOTE — Anesthesia Procedure Notes (Signed)
Spinal  Patient location during procedure: OR Start time: 02/26/2022 11:36 AM End time: 02/26/2022 11:40 AM Reason for block: surgical anesthesia Staffing Performed: anesthesiologist  Anesthesiologist: Oleta Mouse, MD Performed by: Oleta Mouse, MD Authorized by: Oleta Mouse, MD   Preanesthetic Checklist Completed: patient identified, IV checked, risks and benefits discussed, surgical consent, monitors and equipment checked, pre-op evaluation and timeout performed Spinal Block Patient position: sitting Prep: DuraPrep Patient monitoring: heart rate, cardiac monitor, continuous pulse ox and blood pressure Approach: midline Location: L4-5 Injection technique: single-shot Needle Needle type: Pencan  Needle gauge: 24 G Needle length: 9 cm Assessment Sensory level: T6 Events: CSF return

## 2022-02-26 NOTE — Op Note (Signed)
OPERATIVE REPORT-TOTAL KNEE ARTHROPLASTY   Pre-operative diagnosis- Osteoarthritis  Right knee(s)  Post-operative diagnosis- Osteoarthritis Right knee(s)  Procedure-  Right  Total Knee Arthroplasty  Surgeon- Dione Plover. Charleigh Correnti, MD  Assistant- Theresa Duty, PA-C   Anesthesia-  Adductor canal block and spinal  EBL-50 mL   Drains None  Tourniquet time-  Total Tourniquet Time Documented: Thigh (Right) - 36 minutes Total: Thigh (Right) - 36 minutes     Complications- None  Condition-PACU - hemodynamically stable.   Brief Clinical Note  Cheryl Thompson is a 56 y.o. year old female with end stage OA of her right knee with progressively worsening pain and dysfunction. She has constant pain, with activity and at rest and significant functional deficits with difficulties even with ADLs. She has had extensive non-op management including analgesics, injections of cortisone and viscosupplements, and home exercise program, but remains in significant pain with significant dysfunction.Radiographs show bone on bone arthritis lateral and patellofemoral. She presents now for right Total Knee Arthroplasty.     Procedure in detail---   The patient is brought into the operating room and positioned supine on the operating table. After successful administration of  Adductor canal block and spinal,   a tourniquet is placed high on the  Right thigh(s) and the lower extremity is prepped and draped in the usual sterile fashion. Time out is performed by the operating team and then the  Right lower extremity is wrapped in Esmarch, knee flexed and the tourniquet inflated to 300 mmHg.       A midline incision is made with a ten blade through the subcutaneous tissue to the level of the extensor mechanism. A fresh blade is used to make a medial parapatellar arthrotomy. Soft tissue over the proximal medial tibia is subperiosteally elevated to the joint line with a knife and into the semimembranosus bursa with  a Cobb elevator. Soft tissue over the proximal lateral tibia is elevated with attention being paid to avoiding the patellar tendon on the tibial tubercle. The patella is everted, knee flexed 90 degrees and the ACL and PCL are removed. Findings are bone on bone lateral and patellofemoral with large global osteophytes.        The drill is used to create a starting hole in the distal femur and the canal is thoroughly irrigated with sterile saline to remove the fatty contents. The 5 degree Right  valgus alignment guide is placed into the femoral canal and the distal femoral cutting block is pinned to remove 9 mm off the distal femur. Resection is made with an oscillating saw.      The tibia is subluxed forward and the menisci are removed. The extramedullary alignment guide is placed referencing proximally at the medial aspect of the tibial tubercle and distally along the second metatarsal axis and tibial crest. The block is pinned to remove 22m off the more deficient lateral  side. Resection is made with an oscillating saw. Size 5 is the most appropriate size for the tibia and the proximal tibia is prepared with the modular drill and keel punch for that size.      The femoral sizing guide is placed and size 6 is most appropriate. Rotation is marked off the epicondylar axis and confirmed by creating a rectangular flexion gap at 90 degrees. The size 6 cutting block is pinned in this rotation and the anterior, posterior and chamfer cuts are made with the oscillating saw. The intercondylar block is then placed and that cut is made.  Trial size 5 tibial component, trial size 6 posterior stabilized femur and a 7  mm posterior stabilized rotating platform insert trial is placed. Full extension is achieved with excellent varus/valgus and anterior/posterior balance throughout full range of motion. The patella is everted and thickness measured to be 24  mm. Free hand resection is taken to 14 mm, a 35 template is placed,  lug holes are drilled, trial patella is placed, and it tracks normally. Osteophytes are removed off the posterior femur with the trial in place. All trials are removed and the cut bone surfaces prepared with pulsatile lavage. Cement is mixed and once ready for implantation, the size 5 tibial implant, size  6 posterior stabilized femoral component, and the size 35 patella are cemented in place and the patella is held with the clamp. The trial insert is placed and the knee held in full extension. The Exparel (20 ml mixed with 60 ml saline) is injected into the extensor mechanism, posterior capsule, medial and lateral gutters and subcutaneous tissues.  All extruded cement is removed and once the cement is hard the permanent 7 mm posterior stabilized rotating platform insert is placed into the tibial tray.      The wound is copiously irrigated with saline solution and the extensor mechanism closed with # 0 Stratofix suture. The tourniquet is released for a total tourniquet time of 36  minutes. Flexion against gravity is 140 degrees and the patella tracks normally. Subcutaneous tissue is closed with 2.0 vicryl and subcuticular with running 4.0 Monocryl. The incision is cleaned and dried and steri-strips and a bulky sterile dressing are applied. The limb is placed into a knee immobilizer and the patient is awakened and transported to recovery in stable condition.      Please note that a surgical assistant was a medical necessity for this procedure in order to perform it in a safe and expeditious manner. Surgical assistant was necessary to retract the ligaments and vital neurovascular structures to prevent injury to them and also necessary for proper positioning of the limb to allow for anatomic placement of the prosthesis.   Dione Plover Lataysha Vohra, MD    02/26/2022, 12:57 PM

## 2022-02-26 NOTE — Interval H&P Note (Signed)
History and Physical Interval Note:  02/26/2022 9:31 AM  Cheryl Thompson  has presented today for surgery, with the diagnosis of right knee osteoarthritis.  The various methods of treatment have been discussed with the patient and family. After consideration of risks, benefits and other options for treatment, the patient has consented to  Procedure(s): TOTAL KNEE ARTHROPLASTY (Right) as a surgical intervention.  The patient's history has been reviewed, patient examined, no change in status, stable for surgery.  I have reviewed the patient's chart and labs.  Questions were answered to the patient's satisfaction.     Pilar Plate Nazim Kadlec

## 2022-02-26 NOTE — Evaluation (Signed)
Physical Therapy Evaluation Patient Details Name: Cheryl Thompson MRN: [id number] DOB: 01-22-1966 Today's Date: 02/26/2022  History of Present Illness  Pt is a 56 year old female s/p Rt TKA on 02/26/22 with PMHx significant for Lt TKA 11/21/20, CVA with Lt sided residual weakness, seizures, HTN, HIV, HLD, DM  Clinical Impression  Pt is s/p TKA resulting in the deficits listed below (see PT Problem List).  Pt will benefit from skilled PT to increase their independence and safety with mobility to allow discharge to the venue listed below.   Pt assisted with ambulating in hallway POD #0.  Pt plans to d/c home with assist from family.          Recommendations for follow up therapy are one component of a multi-disciplinary discharge planning process, led by the attending physician.  Recommendations may be updated based on patient status, additional functional criteria and insurance authorization.  Follow Up Recommendations Follow physician's recommendations for discharge plan and follow up therapies      Assistance Recommended at Discharge PRN  Patient can return home with the following  Help with stairs or ramp for entrance;Assist for transportation    Equipment Recommendations None recommended by PT  Recommendations for Other Services       Functional Status Assessment Patient has had a recent decline in their functional status and demonstrates the ability to make significant improvements in function in a reasonable and predictable amount of time.     Precautions / Restrictions Precautions Precautions: Fall;Knee Required Braces or Orthoses: Knee Immobilizer - Right Restrictions Weight Bearing Restrictions: No Other Position/Activity Restrictions: WBAT      Mobility  Bed Mobility Overal bed mobility: Needs Assistance Bed Mobility: Supine to Sit     Supine to sit: Min assist, HOB elevated     General bed mobility comments: cues for technique, assist for R LE     Transfers Overall transfer level: Needs assistance Equipment used: Rolling walker (2 wheels) Transfers: Sit to/from Stand Sit to Stand: Min assist           General transfer comment: verbal cues for UE and LE positioning, light assist to rise and steady    Ambulation/Gait Ambulation/Gait assistance: Min guard Gait Distance (Feet): 60 Feet Assistive device: Rolling walker (2 wheels) Gait Pattern/deviations: Step-to pattern, Decreased stance time - right, Antalgic Gait velocity: decr     General Gait Details: verbal cues for sequence, RW positioning, step length, posture  Stairs            Wheelchair Mobility    Modified Rankin (Stroke Patients Only)       Balance                                             Pertinent Vitals/Pain Pain Assessment Pain Assessment: 0-10 Pain Score: 4  Pain Location: right knee Pain Descriptors / Indicators: Sore Pain Intervention(s): Monitored during session, Repositioned, Ice applied    Home Living Family/patient expects to be discharged to:: Private residence Living Arrangements: Alone Available Help at Discharge: Family Type of Home: House Home Access: Stairs to enter Entrance Stairs-Rails: None Technical brewer of Steps: 3   Home Layout: One level Home Equipment: Conservation officer, nature (2 wheels) Additional Comments: pt reports her family especially her brother will assist her at home    Prior Function Prior Level of Function : Independent/Modified Independent  Hand Dominance        Extremity/Trunk Assessment        Lower Extremity Assessment Lower Extremity Assessment: RLE deficits/detail RLE Deficits / Details: unable to perform SLR, assisted with donning KI, able to perform ankle pumps, upon standing pt stated still feeling numb in buttocks region       Communication   Communication: No difficulties  Cognition Arousal/Alertness: Awake/alert Behavior  During Therapy: WFL for tasks assessed/performed Overall Cognitive Status: Within Functional Limits for tasks assessed                                          General Comments      Exercises     Assessment/Plan    PT Assessment Patient needs continued PT services  PT Problem List Decreased strength;Decreased activity tolerance;Decreased mobility;Pain;Decreased range of motion;Decreased knowledge of use of DME       PT Treatment Interventions Stair training;Gait training;Therapeutic exercise;DME instruction;Functional mobility training;Patient/family education;Therapeutic activities    PT Goals (Current goals can be found in the Care Plan section)  Acute Rehab PT Goals PT Goal Formulation: With patient Time For Goal Achievement: 03/03/22 Potential to Achieve Goals: Good    Frequency 7X/week     Co-evaluation               AM-PAC PT "6 Clicks" Mobility  Outcome Measure Help needed turning from your back to your side while in a flat bed without using bedrails?: A Little Help needed moving from lying on your back to sitting on the side of a flat bed without using bedrails?: A Little Help needed moving to and from a bed to a chair (including a wheelchair)?: A Little Help needed standing up from a chair using your arms (e.g., wheelchair or bedside chair)?: A Little Help needed to walk in hospital room?: A Little Help needed climbing 3-5 steps with a railing? : A Little 6 Click Score: 18    End of Session Equipment Utilized During Treatment: Gait belt;Right knee immobilizer Activity Tolerance: Patient tolerated treatment well Patient left: in chair;with call bell/phone within reach;with chair alarm set;with family/visitor present   PT Visit Diagnosis: Other abnormalities of gait and mobility (R26.89)    Time: 6967-8938 PT Time Calculation (min) (ACUTE ONLY): 15 min   Charges:   PT Evaluation $PT Eval Low Complexity: 1 Low        Kati PT,  DPT Physical Therapist Acute Rehabilitation Services Preferred contact method: Secure Chat Weekend Pager Only: [phone] Office: [phone]   Myrtis Hopping [place] 02/26/2022, 4:24 PM

## 2022-02-27 ENCOUNTER — Encounter (HOSPITAL_COMMUNITY): Payer: Self-pay | Admitting: Orthopedic Surgery

## 2022-02-27 DIAGNOSIS — Z79899 Other long term (current) drug therapy: Secondary | ICD-10-CM | POA: Diagnosis not present

## 2022-02-27 DIAGNOSIS — M1711 Unilateral primary osteoarthritis, right knee: Secondary | ICD-10-CM | POA: Diagnosis not present

## 2022-02-27 DIAGNOSIS — Z8673 Personal history of transient ischemic attack (TIA), and cerebral infarction without residual deficits: Secondary | ICD-10-CM | POA: Diagnosis not present

## 2022-02-27 DIAGNOSIS — I10 Essential (primary) hypertension: Secondary | ICD-10-CM | POA: Diagnosis not present

## 2022-02-27 DIAGNOSIS — F17210 Nicotine dependence, cigarettes, uncomplicated: Secondary | ICD-10-CM | POA: Diagnosis not present

## 2022-02-27 DIAGNOSIS — Z7984 Long term (current) use of oral hypoglycemic drugs: Secondary | ICD-10-CM | POA: Diagnosis not present

## 2022-02-27 DIAGNOSIS — E119 Type 2 diabetes mellitus without complications: Secondary | ICD-10-CM | POA: Diagnosis not present

## 2022-02-27 DIAGNOSIS — E039 Hypothyroidism, unspecified: Secondary | ICD-10-CM | POA: Diagnosis not present

## 2022-02-27 DIAGNOSIS — Z96652 Presence of left artificial knee joint: Secondary | ICD-10-CM | POA: Diagnosis not present

## 2022-02-27 DIAGNOSIS — M25561 Pain in right knee: Secondary | ICD-10-CM | POA: Diagnosis not present

## 2022-02-27 LAB — BASIC METABOLIC PANEL
Anion gap: 7 (ref 5–15)
BUN: 16 mg/dL (ref 6–20)
CO2: 25 mmol/L (ref 22–32)
Calcium: 8.7 mg/dL — ABNORMAL LOW (ref 8.9–10.3)
Chloride: 103 mmol/L (ref 98–111)
Creatinine, Ser: 0.88 mg/dL (ref 0.44–1.00)
GFR, Estimated: >60 mL/min (ref >60)
Glucose, Bld: 210 mg/dL — ABNORMAL HIGH (ref 70–99)
Potassium: 3.8 mmol/L (ref 3.5–5.1)
Sodium: 135 mmol/L (ref 135–145)

## 2022-02-27 LAB — CBC
HCT: 29.3 % — ABNORMAL LOW (ref 36.0–46.0)
Hemoglobin: 9.7 g/dL — ABNORMAL LOW (ref 12.0–15.0)
MCH: 30.5 pg (ref 26.0–34.0)
MCHC: 33.1 g/dL (ref 30.0–36.0)
MCV: 92.1 fL (ref 80.0–100.0)
Platelets: 122 10*3/uL — ABNORMAL LOW (ref 150–400)
RBC: 3.18 MIL/uL — ABNORMAL LOW (ref 3.87–5.11)
RDW: 13.9 % (ref 11.5–15.5)
WBC: 10.8 10*3/uL — ABNORMAL HIGH (ref 4.0–10.5)
nRBC: 0 % (ref 0.0–0.2)

## 2022-02-27 LAB — GLUCOSE, CAPILLARY: Glucose-Capillary: 149 mg/dL — ABNORMAL HIGH (ref 70–99)

## 2022-02-27 MED ORDER — OXYCODONE HCL 5 MG PO TABS: 5.0000 mg | ORAL_TABLET | Freq: Four times a day (QID) | ORAL | 0 refills | Status: DC | PRN

## 2022-02-27 MED ORDER — ASPIRIN 325 MG PO TBEC: 325.0000 mg | DELAYED_RELEASE_TABLET | Freq: Two times a day (BID) | ORAL | 0 refills | Status: AC

## 2022-02-27 MED ORDER — TRAMADOL HCL 50 MG PO TABS: 50.0000 mg | ORAL_TABLET | Freq: Four times a day (QID) | ORAL | 0 refills | Status: DC

## 2022-02-27 NOTE — Progress Notes (Signed)
Subjective: 1 Day Post-Op Procedure(s) (LRB): TOTAL KNEE ARTHROPLASTY (Right) Patient reports pain as mild.   Patient seen in rounds by Dr. Wynelle Link. Patient is well, and has had no acute complaints or problems No issues overnight. Denies chest pain, SOB, or calf pain. Foley catheter removed this AM.  We will continue therapy today, ambulated 76' yesterday.   Objective: Vital signs in last 24 hours: Temp:  [97.5 F (36.4 C)-97.8 F (36.6 C)] 97.5 F (36.4 C) (08/15 0544) Pulse Rate:  [68-89] 68 (08/15 0544) Resp:  [13-19] 17 (08/15 0544) BP: (112-177)/(65-94) 177/89 (08/15 0544) SpO2:  [92 %-100 %] 100 % (08/15 0544) Weight:  [99.3 kg] 99.3 kg (08/14 0938)  Intake/Output from previous day:  Intake/Output Summary (Last 24 hours) at 02/27/2022 0718 Last data filed at 02/27/2022 0600 Gross per 24 hour  Intake 3612.41 ml  Output 1960 ml  Net 1652.41 ml     Intake/Output this shift: No intake/output data recorded.  Labs: Recent Labs    02/27/22 0322  HGB 9.7*   Recent Labs    02/27/22 0322  WBC 10.8*  RBC 3.18*  HCT 29.3*  PLT 122*   Recent Labs    02/27/22 0322  NA 135  K 3.8  CL 103  CO2 25  BUN 16  CREATININE 0.88  GLUCOSE 210*  CALCIUM 8.7*   No results for input(s): "LABPT", "INR" in the last 72 hours.  Exam: General - Patient is Alert and Oriented Extremity - Neurologically intact Neurovascular intact Sensation intact distally Dorsiflexion/Plantar flexion intact Dressing - dressing C/D/I Motor Function - intact, moving foot and toes well on exam.   Past Medical History:  Diagnosis Date   Acute alcoholic hepatitis    Alcoholism ([place])    Anxiety    Arthritis    Bipolar disorder ([place])    Colon polyps    Concussion 11/29/2021   Depression    Diverticulitis 2021   Diverticulosis 2021   Hepatitis C    History of hiatal hernia    HIV (human immunodeficiency virus infection) ([place])    HLD (hyperlipidemia)    Hypertension     Hypothyroidism    Neuromuscular disorder ([place])    neuropathy   Pneumonia    as a teenager    Pre-diabetes    Seizures ([place])    2016 last seizure per pt   Stroke ([place]) 2018   weakness on left side     Assessment/Plan: 1 Day Post-Op Procedure(s) (LRB): TOTAL KNEE ARTHROPLASTY (Right) Principal Problem:   Osteoarthritis of right knee  Estimated body mass index is 32.34 kg/m as calculated from the following:   Height as of this encounter: '5\' 9"'$  (1.753 m).   Weight as of this encounter: 99.3 kg. Advance diet Up with therapy D/C IV fluids   Patient's anticipated LOS is less than 2 midnights, meeting these requirements: - Younger than 31 - Lives within 1 hour of care - Has a competent adult at home to recover with post-op recover - NO history of  - Chronic pain requiring opioids  - Diabetes  - Coronary Artery Disease  - Heart failure  - Heart attack  - DVT/VTE  - Cardiac arrhythmia  - Respiratory Failure/COPD  - Renal failure  - Anemia  - Advanced Liver disease  DVT Prophylaxis - Aspirin Weight bearing as tolerated. Continue therapy.  Plan is to go Home after hospital stay. Plan for discharge later today if progresses with therapy and meeting goals. Scheduled for OPPT at  [facility] [employer] account planner). Follow-up in the office in 2 weeks.  The PDMP database was reviewed today prior to any opioid medications being prescribed to this patient.  Theresa Duty, PA-C Orthopedic Surgery [phone] 02/27/2022, 7:18 AM

## 2022-02-27 NOTE — Progress Notes (Signed)
Physical Therapy Treatment Patient Details Name: Cheryl Thompson MRN: [id number] DOB: 1966/01/22 Today's Date: 02/27/2022   History of Present Illness Pt is a 56 year old female s/p Rt TKA on 02/26/22 with PMHx significant for Lt TKA 11/21/20, CVA with Lt sided residual weakness, seizures, HTN, HIV, HLD, DM    PT Comments    Pt is progressing well with mobility, she ambulated 120' with RW, completed stair training, and demonstrates good understanding of HEP. She is ready to DC home from a PT standpoint.    Recommendations for follow up therapy are one component of a multi-disciplinary discharge planning process, led by the attending physician.  Recommendations may be updated based on patient status, additional functional criteria and insurance authorization.  Follow Up Recommendations  Follow physician's recommendations for discharge plan and follow up therapies     Assistance Recommended at Discharge Intermittent Supervision/Assistance  Patient can return home with the following Help with stairs or ramp for entrance;Assist for transportation;Assistance with cooking/housework   Equipment Recommendations  None recommended by PT    Recommendations for Other Services       Precautions / Restrictions Precautions Precautions: Fall;Knee Precaution Booklet Issued: Yes (comment) Precaution Comments: reviewed no pillow under knee Required Braces or Orthoses: Knee Immobilizer - Right Restrictions Weight Bearing Restrictions: No Other Position/Activity Restrictions: WBAT     Mobility  Bed Mobility Overal bed mobility: Modified Independent Bed Mobility: Supine to Sit     Supine to sit: HOB elevated, Modified independent (Device/Increase time)     General bed mobility comments: used gait belt as leg lifter    Transfers Overall transfer level: Needs assistance Equipment used: Rolling walker (2 wheels) Transfers: Sit to/from Stand Sit to Stand: Supervision           General  transfer comment: verbal cues for UE and LE positioning    Ambulation/Gait Ambulation/Gait assistance: Supervision Gait Distance (Feet): 120 Feet Assistive device: Rolling walker (2 wheels) Gait Pattern/deviations: Step-to pattern, Decreased stance time - right, Antalgic Gait velocity: decr     General Gait Details: steady, no loss of balance, KI on RLE   Stairs Stairs: Yes Stairs assistance: Min assist Stair Management: No rails, Backwards, Step to pattern, With walker Number of Stairs: 3 General stair comments: VCs sequencing, sister assisted with steadying RW, 3 steps x 2 trials   Wheelchair Mobility    Modified Rankin (Stroke Patients Only)       Balance Overall balance assessment: Modified Independent                                          Cognition Arousal/Alertness: Awake/alert Behavior During Therapy: WFL for tasks assessed/performed Overall Cognitive Status: Within Functional Limits for tasks assessed                                          Exercises Total Joint Exercises Ankle Circles/Pumps: AROM, Both, 10 reps, Supine Quad Sets: AROM, 10 reps, Both, Supine Short Arc Quad: Right, AAROM, 10 reps, Supine Heel Slides: AAROM, Right, 10 reps, Supine Hip ABduction/ADduction: AAROM, Right, 10 reps, Supine Straight Leg Raises: AAROM, Right, 10 reps, Supine Long Arc Quad: AAROM, Right, 10 reps, Seated Knee Flexion: AAROM, Right, 10 reps, Seated Goniometric ROM: 5-65* AAROM R knee    General Comments  Pertinent Vitals/Pain Pain Assessment Pain Score: 10-Worst pain ever Pain Location: right knee Pain Descriptors / Indicators: Sore Pain Intervention(s): Limited activity within patient's tolerance, Monitored during session, Premedicated before session, Ice applied    Home Living                          Prior Function            PT Goals (current goals can now be found in the care plan section)  Acute Rehab PT Goals PT Goal Formulation: With patient Time For Goal Achievement: 03/03/22 Potential to Achieve Goals: Good Progress towards PT goals: Progressing toward goals    Frequency    7X/week      PT Plan Current plan remains appropriate    Co-evaluation              AM-PAC PT "6 Clicks" Mobility   Outcome Measure  Help needed turning from your back to your side while in a flat bed without using bedrails?: None Help needed moving from lying on your back to sitting on the side of a flat bed without using bedrails?: A Little Help needed moving to and from a bed to a chair (including a wheelchair)?: A Little Help needed standing up from a chair using your arms (e.g., wheelchair or bedside chair)?: None Help needed to walk in hospital room?: None Help needed climbing 3-5 steps with a railing? : A Little 6 Click Score: 21    End of Session Equipment Utilized During Treatment: Gait belt;Right knee immobilizer Activity Tolerance: Patient tolerated treatment well Patient left: in chair;with call bell/phone within reach;with chair alarm set;with family/visitor present Nurse Communication: Mobility status PT Visit Diagnosis: Other abnormalities of gait and mobility (R26.89);Pain Pain - Right/Left: Right Pain - part of body: Knee     Time: 1275-1700 PT Time Calculation (min) (ACUTE ONLY): 46 min  Charges:  $Gait Training: 8-22 mins $Therapeutic Exercise: 8-22 mins $Therapeutic Activity: 8-22 mins                     Cheryl Thompson PT 02/27/2022  Acute Rehabilitation Services  Office [phone]

## 2022-02-27 NOTE — Plan of Care (Signed)
Pt ready to DC home with sister. 

## 2022-02-27 NOTE — Anesthesia Postprocedure Evaluation (Signed)
Anesthesia Post Note  Patient: Julieana Eshleman  Procedure(s) Performed: TOTAL KNEE ARTHROPLASTY (Right: Knee)     Patient location during evaluation: PACU Anesthesia Type: Regional, MAC and Spinal Level of consciousness: awake and alert Pain management: pain level controlled Vital Signs Assessment: post-procedure vital signs reviewed and stable Respiratory status: spontaneous breathing, nonlabored ventilation and respiratory function stable Cardiovascular status: stable and blood pressure returned to baseline Postop Assessment: no apparent nausea or vomiting Anesthetic complications: no   No notable events documented.  Last Vitals:  Vitals:   02/27/22 0142 02/27/22 0544  BP: (!) 130/92 (!) 177/89  Pulse: 74 68  Resp: 17 17  Temp: (!) 36.4 C (!) 36.4 C  SpO2: 100% 100%    Last Pain:  Vitals:   02/27/22 0818  TempSrc:   PainSc: 5                  Myrian Botello

## 2022-02-27 NOTE — TOC Transition Note (Signed)
Transition of Care [facility]) - CM/SW Discharge Note   Patient Details  Name: Cheryl Thompson MRN: [id number] Date of Birth: Dec 28, 1965  [facility] CM/SW Contact:  Lennart Pall, LCSW Phone Number: 02/27/2022, 9:35 AM   Clinical Narrative:     Met with pt and confirming she has all needed DME at home.  OPPT already set up with [facility].  No TOC needs.  Final next level of care: OP Rehab Barriers to Discharge: No Barriers Identified   Patient Goals and CMS Choice Patient states their goals for this hospitalization and ongoing recovery are:: return home      Discharge Placement                       Discharge Plan and Services                DME Arranged: N/A DME Agency: NA                  Social Determinants of Health (SDOH) Interventions     Readmission Risk Interventions     No data to display

## 2022-02-28 NOTE — Discharge Summary (Signed)
Patient ID: Cheryl Thompson MRN: [id number] DOB/AGE: [birth year] 56 y.o.  Admit date: 02/26/2022 Discharge date: 02/27/2022  Admission Diagnoses:  Principal Problem:   Osteoarthritis of right knee   Discharge Diagnoses:  Same  Past Medical History:  Diagnosis Date   Acute alcoholic hepatitis    Alcoholism ([place])    Anxiety    Arthritis    Bipolar disorder ([place])    Colon polyps    Concussion 11/29/2021   Depression    Diverticulitis 2021   Diverticulosis 2021   Hepatitis C    History of hiatal hernia    HIV (human immunodeficiency virus infection) ([place])    HLD (hyperlipidemia)    Hypertension    Hypothyroidism    Neuromuscular disorder ([place])    neuropathy   Pneumonia    as a teenager    Pre-diabetes    Seizures ([place])    2016 last seizure per pt   Stroke ([place]) 2018   weakness on left side     Surgeries: Procedure(s): TOTAL KNEE ARTHROPLASTY on 02/26/2022   Consultants:   Discharged Condition: Improved  Hospital Course: Cheryl Thompson is an 56 y.o. female who was admitted 02/26/2022 for operative treatment ofOsteoarthritis of right knee. Patient has severe unremitting pain that affects sleep, daily activities, and work/hobbies. After pre-op clearance the patient was taken to the operating room on 02/26/2022 and underwent  Procedure(s): TOTAL KNEE ARTHROPLASTY.    Patient was given perioperative antibiotics:  Anti-infectives (From admission, onward)    Start     Dose/Rate Route Frequency Ordered Stop   02/27/22 0800  Darunavir-Cobicistat-Emtricitabine-Tenofovir Alafenamide (SYMTUZA) 800-150-200-10 MG TABS 1 tablet  Status:  Discontinued        1 tablet Oral Daily with breakfast 02/26/22 1420 02/27/22 1513   02/26/22 2300  vancomycin (VANCOCIN) IVPB 1000 mg/200 mL premix        1,000 mg 200 mL/hr over 60 Minutes Intravenous Every 12 hours 02/26/22 1410 02/27/22 0050   02/26/22 0930  vancomycin (VANCOREADY) IVPB 1500 mg/300 mL        1,500 mg 150 mL/hr over 120  Minutes Intravenous On call to O.R. 02/26/22 0926 02/26/22 1234        Patient was given sequential compression devices, early ambulation, and chemoprophylaxis to prevent DVT.  Patient benefited maximally from hospital stay and there were no complications.    Recent vital signs: No data found.   Recent laboratory studies:  Recent Labs    02/27/22 0322  WBC 10.8*  HGB 9.7*  HCT 29.3*  PLT 122*  NA 135  K 3.8  CL 103  CO2 25  BUN 16  CREATININE 0.88  GLUCOSE 210*  CALCIUM 8.7*     Discharge Medications:   Allergies as of 02/27/2022       Reactions   Hydrocodone Other (See Comments)   confusion, dizziness   Penicillins Anaphylaxis   Naproxen Hives, Itching, Rash   Orange tablet=itching   Codeine Other (See Comments)   confusion, dizziness   Doxycycline Hives   blisters   Morphine And Related    Recovering narcotic user-prefers no narcs   Statins Nausea And Vomiting   Biktarvy [bictegravir-emtricitab-tenofov] Rash        Medication List     STOP taking these medications    diclofenac Sodium 1 % Gel Commonly known as: VOLTAREN   fluticasone 50 MCG/ACT nasal spray Commonly known as: FLONASE   ibuprofen 800 MG tablet Commonly known as: ADVIL   lidocaine 5 %  Commonly known as: Lidoderm   Sarna lotion Generic drug: camphor-menthol       TAKE these medications    albuterol 108 (90 Base) MCG/ACT inhaler Commonly known as: VENTOLIN HFA Inhale 2 puffs into the lungs every 6 (six) hours as needed for wheezing or shortness of breath.   aspirin EC 325 MG tablet Take 1 tablet (325 mg total) by mouth 2 (two) times daily for 20 days. Then take one 81 mg aspirin once a day for three weeks. Then discontinue aspirin.   cyclobenzaprine 10 MG tablet Commonly known as: FLEXERIL One half tab PO qHS, then increase gradually to one tab TID. What changed:  how much to take how to take this when to take this reasons to take this additional instructions    DULoxetine 60 MG capsule Commonly known as: CYMBALTA Take 1 capsule (60 mg total) by mouth daily.   hydrochlorothiazide 25 MG tablet Commonly known as: HYDRODIURIL TAKE 1 TABLET(25 MG) BY MOUTH DAILY FOR HIGH BLOOD PRESSURE   hydrOXYzine 25 MG tablet Commonly known as: ATARAX Take 1 tablet (25 mg total) by mouth daily as needed for anxiety.   hyoscyamine 0.125 MG SL tablet Commonly known as: LEVSIN SL Place 1 tablet (0.125 mg total) under the tongue every 4 (four) hours as needed.   levETIRAcetam 500 MG tablet Commonly known as: KEPPRA TAKE 1 TABLET(500 MG) BY MOUTH TWICE DAILY   levocetirizine 5 MG tablet Commonly known as: Xyzal Take 1 tablet (5 mg total) by mouth every evening.   levothyroxine 175 MCG tablet Commonly known as: SYNTHROID Take 1 tablet (175 mcg total) by mouth daily before breakfast.   metFORMIN 500 MG tablet Commonly known as: GLUCOPHAGE Take 1 tablet (500 mg total) by mouth 2 (two) times daily with a meal.   oxyCODONE 5 MG immediate release tablet Commonly known as: Oxy IR/ROXICODONE Take 1-2 tablets (5-10 mg total) by mouth every 6 (six) hours as needed for severe pain. Not to exceed 6 tablets a day.   pregabalin 25 MG capsule Commonly known as: LYRICA Take  25 mg capsule twice a day.   QUEtiapine 50 MG tablet Commonly known as: SEROquel Take 1 tablet (50 mg total) by mouth at bedtime.   Symtuza 800-150-200-10 MG Tabs Generic drug: Darunavir-Cobicistat-Emtricitabine-Tenofovir Alafenamide Take 1 tablet by mouth daily with breakfast.   traMADol 50 MG tablet Commonly known as: ULTRAM Take 1 tablet (50 mg total) by mouth every 6 (six) hours.               Discharge Care Instructions  (From admission, onward)           Start     Ordered   02/27/22 0000  Weight bearing as tolerated        02/27/22 0735   02/27/22 0000  Change dressing       Comments: You may remove the bulky bandage (ACE wrap and gauze) two days after surgery.  You will have an adhesive waterproof bandage underneath. Leave this in place until your first follow-up appointment.   02/27/22 0735            Diagnostic Studies: No results found.  Disposition: Discharge disposition: 01-Home or Self Care       Discharge Instructions     Call MD / Call 911   Complete by: As directed    If you experience chest pain or shortness of breath, CALL 911 and be transported to the hospital emergency room.  If you develope  a fever above 101 F, pus (white drainage) or increased drainage or redness at the wound, or calf pain, call your surgeon's office.   Change dressing   Complete by: As directed    You may remove the bulky bandage (ACE wrap and gauze) two days after surgery. You will have an adhesive waterproof bandage underneath. Leave this in place until your first follow-up appointment.   Constipation Prevention   Complete by: As directed    Drink plenty of fluids.  Prune juice may be helpful.  You may use a stool softener, such as Colace (over the counter) 100 mg twice a day.  Use MiraLax (over the counter) for constipation as needed.   Diet - low sodium heart healthy   Complete by: As directed    Do not put a pillow under the knee. Place it under the heel.   Complete by: As directed    Driving restrictions   Complete by: As directed    No driving for two weeks   Post-operative opioid taper instructions:   Complete by: As directed    POST-OPERATIVE OPIOID TAPER INSTRUCTIONS: It is important to wean off of your opioid medication as soon as possible. If you do not need pain medication after your surgery it is ok to stop day one. Opioids include: Codeine, Hydrocodone(Norco, Vicodin), Oxycodone(Percocet, oxycontin) and hydromorphone amongst others.  Long term and even short term use of opiods can cause: Increased pain response Dependence Constipation Depression Respiratory depression And more.  Withdrawal symptoms can include Flu like  symptoms Nausea, vomiting And more Techniques to manage these symptoms Hydrate well Eat regular healthy meals Stay active Use relaxation techniques(deep breathing, meditating, yoga) Do Not substitute Alcohol to help with tapering If you have been on opioids for less than two weeks and do not have pain than it is ok to stop all together.  Plan to wean off of opioids This plan should start within one week post op of your joint replacement. Maintain the same interval or time between taking each dose and first decrease the dose.  Cut the total daily intake of opioids by one tablet each day Next start to increase the time between doses. The last dose that should be eliminated is the evening dose.      TED hose   Complete by: As directed    Use stockings (TED hose) for three weeks on both leg(s).  You may remove them at night for sleeping.   Weight bearing as tolerated   Complete by: As directed         Follow-up Information     Aluisio, Pilar Plate, MD. Schedule an appointment as soon as possible for a visit in 2 week(s).   Specialty: Orthopedic Surgery Contact information: [address] [place] [address] [phone]                  Signed: Theresa Duty 02/28/2022, 2:11 PM

## 2022-03-01 ENCOUNTER — Encounter: Payer: Self-pay | Admitting: Physical Therapy

## 2022-03-01 ENCOUNTER — Ambulatory Visit: Payer: Medicare Other | Attending: Student | Admitting: Physical Therapy

## 2022-03-01 DIAGNOSIS — R2689 Other abnormalities of gait and mobility: Secondary | ICD-10-CM | POA: Insufficient documentation

## 2022-03-01 DIAGNOSIS — R600 Localized edema: Secondary | ICD-10-CM | POA: Diagnosis not present

## 2022-03-01 DIAGNOSIS — M25561 Pain in right knee: Secondary | ICD-10-CM | POA: Diagnosis not present

## 2022-03-01 DIAGNOSIS — M25661 Stiffness of right knee, not elsewhere classified: Secondary | ICD-10-CM | POA: Diagnosis not present

## 2022-03-01 DIAGNOSIS — M6281 Muscle weakness (generalized): Secondary | ICD-10-CM | POA: Diagnosis not present
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
## 2022-03-05 ENCOUNTER — Ambulatory Visit: Payer: Medicare Other | Admitting: Physical Therapy

## 2022-03-07 ENCOUNTER — Ambulatory Visit (HOSPITAL_COMMUNITY): Payer: Medicare Other | Admitting: Licensed Clinical Social Worker

## 2022-03-07 DIAGNOSIS — F4310 Post-traumatic stress disorder, unspecified: Secondary | ICD-10-CM

## 2022-03-07 DIAGNOSIS — F3163 Bipolar disorder, current episode mixed, severe, without psychotic features: Secondary | ICD-10-CM

## 2022-03-07 DIAGNOSIS — Z471 Aftercare following joint replacement surgery: Secondary | ICD-10-CM | POA: Diagnosis not present

## 2022-03-07 DIAGNOSIS — F411 Generalized anxiety disorder: Secondary | ICD-10-CM

## 2022-03-07 DIAGNOSIS — F332 Major depressive disorder, recurrent severe without psychotic features: Secondary | ICD-10-CM

## 2022-03-07 NOTE — Progress Notes (Signed)
Patient in emergency in lots of pain going to the surgeon so both therapist and patient agreed to cancel session

## 2022-03-08 ENCOUNTER — Ambulatory Visit: Payer: Medicare Other

## 2022-03-15 ENCOUNTER — Ambulatory Visit: Payer: Medicare Other | Admitting: Physical Therapy

## 2022-03-15 ENCOUNTER — Encounter: Payer: Self-pay | Admitting: Physical Therapy

## 2022-03-15 DIAGNOSIS — M6281 Muscle weakness (generalized): Secondary | ICD-10-CM

## 2022-03-15 DIAGNOSIS — R600 Localized edema: Secondary | ICD-10-CM

## 2022-03-15 DIAGNOSIS — R2689 Other abnormalities of gait and mobility: Secondary | ICD-10-CM | POA: Diagnosis not present

## 2022-03-15 DIAGNOSIS — M25661 Stiffness of right knee, not elsewhere classified: Secondary | ICD-10-CM

## 2022-03-15 DIAGNOSIS — M25561 Pain in right knee: Secondary | ICD-10-CM
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
## 2022-03-16 ENCOUNTER — Other Ambulatory Visit: Payer: Self-pay | Admitting: Family Medicine

## 2022-03-20 ENCOUNTER — Ambulatory Visit: Payer: Medicare Other | Attending: Student

## 2022-03-20 DIAGNOSIS — M25561 Pain in right knee: Secondary | ICD-10-CM | POA: Diagnosis not present

## 2022-03-20 DIAGNOSIS — R2689 Other abnormalities of gait and mobility: Secondary | ICD-10-CM | POA: Insufficient documentation

## 2022-03-20 DIAGNOSIS — M6281 Muscle weakness (generalized): Secondary | ICD-10-CM | POA: Insufficient documentation

## 2022-03-20 DIAGNOSIS — M25661 Stiffness of right knee, not elsewhere classified: Secondary | ICD-10-CM | POA: Insufficient documentation

## 2022-03-20 DIAGNOSIS — R600 Localized edema: Secondary | ICD-10-CM | POA: Diagnosis not present
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
## 2022-03-22 ENCOUNTER — Encounter: Payer: Self-pay | Admitting: Physical Therapy

## 2022-03-22 ENCOUNTER — Ambulatory Visit: Payer: Medicare Other | Admitting: Physical Therapy

## 2022-03-22 DIAGNOSIS — R2689 Other abnormalities of gait and mobility: Secondary | ICD-10-CM | POA: Diagnosis not present

## 2022-03-22 DIAGNOSIS — M25661 Stiffness of right knee, not elsewhere classified: Secondary | ICD-10-CM

## 2022-03-22 DIAGNOSIS — M6281 Muscle weakness (generalized): Secondary | ICD-10-CM

## 2022-03-22 DIAGNOSIS — R600 Localized edema: Secondary | ICD-10-CM | POA: Diagnosis not present

## 2022-03-22 DIAGNOSIS — M25561 Pain in right knee: Secondary | ICD-10-CM
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
## 2022-03-26 ENCOUNTER — Ambulatory Visit: Payer: Medicare Other | Admitting: Physical Therapy

## 2022-03-26 ENCOUNTER — Encounter: Payer: Self-pay | Admitting: Physical Therapy

## 2022-03-26 DIAGNOSIS — M25661 Stiffness of right knee, not elsewhere classified: Secondary | ICD-10-CM

## 2022-03-26 DIAGNOSIS — R2689 Other abnormalities of gait and mobility: Secondary | ICD-10-CM | POA: Diagnosis not present

## 2022-03-26 DIAGNOSIS — M25561 Pain in right knee: Secondary | ICD-10-CM | POA: Diagnosis not present

## 2022-03-26 DIAGNOSIS — M6281 Muscle weakness (generalized): Secondary | ICD-10-CM

## 2022-03-26 DIAGNOSIS — R600 Localized edema: Secondary | ICD-10-CM
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
## 2022-03-29 ENCOUNTER — Ambulatory Visit: Payer: Medicare Other

## 2022-03-29 ENCOUNTER — Other Ambulatory Visit: Payer: Self-pay | Admitting: Family Medicine

## 2022-03-29 DIAGNOSIS — E1165 Type 2 diabetes mellitus with hyperglycemia: Secondary | ICD-10-CM

## 2022-03-29 DIAGNOSIS — M25561 Pain in right knee: Secondary | ICD-10-CM | POA: Diagnosis not present

## 2022-03-29 DIAGNOSIS — M6281 Muscle weakness (generalized): Secondary | ICD-10-CM

## 2022-03-29 DIAGNOSIS — R2689 Other abnormalities of gait and mobility: Secondary | ICD-10-CM | POA: Diagnosis not present

## 2022-03-29 DIAGNOSIS — R600 Localized edema: Secondary | ICD-10-CM

## 2022-03-29 DIAGNOSIS — M25661 Stiffness of right knee, not elsewhere classified: Secondary | ICD-10-CM

## 2022-03-29 DIAGNOSIS — G629 Polyneuropathy, unspecified: Secondary | ICD-10-CM
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
## 2022-04-02 ENCOUNTER — Encounter: Payer: Self-pay | Admitting: Physical Therapy

## 2022-04-02 ENCOUNTER — Ambulatory Visit: Payer: Medicare Other | Admitting: Physical Therapy

## 2022-04-02 DIAGNOSIS — M25661 Stiffness of right knee, not elsewhere classified: Secondary | ICD-10-CM | POA: Diagnosis not present

## 2022-04-02 DIAGNOSIS — M6281 Muscle weakness (generalized): Secondary | ICD-10-CM

## 2022-04-02 DIAGNOSIS — R2689 Other abnormalities of gait and mobility: Secondary | ICD-10-CM

## 2022-04-02 DIAGNOSIS — R600 Localized edema: Secondary | ICD-10-CM

## 2022-04-02 DIAGNOSIS — M25561 Pain in right knee: Secondary | ICD-10-CM | POA: Diagnosis not present
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
## 2022-04-05 ENCOUNTER — Ambulatory Visit: Payer: Medicare Other

## 2022-04-09 ENCOUNTER — Ambulatory Visit: Payer: Medicare Other | Admitting: Physical Therapy

## 2022-04-10 DIAGNOSIS — Z96652 Presence of left artificial knee joint: Secondary | ICD-10-CM | POA: Diagnosis not present

## 2022-04-10 DIAGNOSIS — Z471 Aftercare following joint replacement surgery: Secondary | ICD-10-CM | POA: Diagnosis not present

## 2022-04-18 ENCOUNTER — Ambulatory Visit (INDEPENDENT_AMBULATORY_CARE_PROVIDER_SITE_OTHER): Payer: Medicare Other | Admitting: Licensed Clinical Social Worker

## 2022-04-18 DIAGNOSIS — F332 Major depressive disorder, recurrent severe without psychotic features: Secondary | ICD-10-CM

## 2022-04-18 DIAGNOSIS — F3163 Bipolar disorder, current episode mixed, severe, without psychotic features: Secondary | ICD-10-CM | POA: Diagnosis not present

## 2022-04-18 DIAGNOSIS — F411 Generalized anxiety disorder: Secondary | ICD-10-CM

## 2022-04-18 DIAGNOSIS — F4310 Post-traumatic stress disorder, unspecified: Secondary | ICD-10-CM
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
## 2022-04-19 ENCOUNTER — Encounter: Payer: Self-pay | Admitting: Physical Therapy

## 2022-04-19 ENCOUNTER — Ambulatory Visit: Payer: Medicare Other | Attending: Student | Admitting: Physical Therapy

## 2022-04-19 DIAGNOSIS — M6281 Muscle weakness (generalized): Secondary | ICD-10-CM | POA: Diagnosis not present

## 2022-04-19 DIAGNOSIS — R2689 Other abnormalities of gait and mobility: Secondary | ICD-10-CM | POA: Diagnosis not present

## 2022-04-19 DIAGNOSIS — R600 Localized edema: Secondary | ICD-10-CM | POA: Insufficient documentation

## 2022-04-19 DIAGNOSIS — M25561 Pain in right knee: Secondary | ICD-10-CM | POA: Diagnosis not present

## 2022-04-19 DIAGNOSIS — M25661 Stiffness of right knee, not elsewhere classified: Secondary | ICD-10-CM | POA: Insufficient documentation
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
## 2022-04-20 ENCOUNTER — Telehealth: Payer: Self-pay | Admitting: Family Medicine

## 2022-04-24 ENCOUNTER — Encounter: Payer: Self-pay | Admitting: Internal Medicine

## 2022-04-24 ENCOUNTER — Ambulatory Visit: Payer: Medicare Other

## 2022-04-24 ENCOUNTER — Ambulatory Visit (INDEPENDENT_AMBULATORY_CARE_PROVIDER_SITE_OTHER): Payer: Medicare Other | Admitting: Internal Medicine

## 2022-04-24 ENCOUNTER — Other Ambulatory Visit: Payer: Self-pay

## 2022-04-24 VITALS — BP 132/84 | HR 109 | Temp 97.8°F | Ht 69.0 in | Wt 222.0 lb

## 2022-04-24 DIAGNOSIS — Z23 Encounter for immunization: Secondary | ICD-10-CM | POA: Diagnosis not present

## 2022-04-24 DIAGNOSIS — F17200 Nicotine dependence, unspecified, uncomplicated: Secondary | ICD-10-CM

## 2022-04-24 DIAGNOSIS — B20 Human immunodeficiency virus [HIV] disease: Secondary | ICD-10-CM | POA: Diagnosis not present

## 2022-04-24 DIAGNOSIS — Z21 Asymptomatic human immunodeficiency virus [HIV] infection status: Secondary | ICD-10-CM

## 2022-04-24 DIAGNOSIS — E785 Hyperlipidemia, unspecified: Secondary | ICD-10-CM | POA: Diagnosis not present

## 2022-04-24 MED ORDER — SYMTUZA 800-150-200-10 MG PO TABS: 1.0000 | ORAL_TABLET | Freq: Every day | ORAL | 11 refills | Status: DC

## 2022-04-24 NOTE — Assessment & Plan Note (Signed)
She continues to do well, no concerns and refills sent.  Labs today and rtc in 6 months.

## 2022-04-24 NOTE — Progress Notes (Signed)
   Subjective:    Patient ID: Cheryl Thompson, female    DOB: 11/23/1965, 56 y.o.   MRN: [id number]  HPI Here for follow up of HIV She continues on Symtuza with no missed doses.  No issues with getting, taking or tolerating her medication.  Labs same day.  No new concerns.    Review of Systems  Constitutional:  Negative for fatigue.  Gastrointestinal:  Negative for diarrhea.  Skin:  Negative for rash.       Objective:   Physical Exam Eyes:     General: No scleral icterus. Pulmonary:     Effort: Pulmonary effort is normal.  Neurological:     General: No focal deficit present.     Mental Status: She is alert.   SH: + tobacco        Assessment & Plan:

## 2022-04-24 NOTE — Assessment & Plan Note (Signed)
Counseled on cessation 

## 2022-04-24 NOTE — Assessment & Plan Note (Signed)
Will check lipid panel. 

## 2022-04-25 LAB — T-HELPER CELL (CD4) - (RCID CLINIC ONLY)
CD4 % Helper T Cell: 47 % (ref 33–65)
CD4 T Cell Abs: 980 /uL (ref 400–1790)

## 2022-04-27 LAB — LIPID PANEL
Cholesterol: 183 mg/dL (ref <200)
HDL: 25 mg/dL — ABNORMAL LOW (ref >=50)
Non-HDL Cholesterol (Calc): 158 mg/dL (calc) — ABNORMAL HIGH (ref <130)
Total CHOL/HDL Ratio: 7.3 (calc) — ABNORMAL HIGH (ref <5.0)
Triglycerides: 462 mg/dL — ABNORMAL HIGH (ref <150)

## 2022-04-27 LAB — HIV-1 RNA QUANT-NO REFLEX-BLD
HIV 1 RNA Quant: <20 Copies/mL — ABNORMAL HIGH
HIV-1 RNA Quant, Log: <1.3 Log cps/mL — ABNORMAL HIGH

## 2022-05-01 ENCOUNTER — Ambulatory Visit: Payer: Medicare Other

## 2022-05-05 ENCOUNTER — Encounter: Payer: Self-pay | Admitting: Internal Medicine

## 2022-05-07 ENCOUNTER — Encounter: Payer: Self-pay | Admitting: Physical Therapy

## 2022-05-07 ENCOUNTER — Encounter (HOSPITAL_COMMUNITY): Payer: Self-pay | Admitting: Psychiatry

## 2022-05-07 ENCOUNTER — Telehealth (INDEPENDENT_AMBULATORY_CARE_PROVIDER_SITE_OTHER): Payer: Medicare Other | Admitting: Psychiatry

## 2022-05-07 DIAGNOSIS — F4310 Post-traumatic stress disorder, unspecified: Secondary | ICD-10-CM

## 2022-05-07 DIAGNOSIS — F411 Generalized anxiety disorder: Secondary | ICD-10-CM

## 2022-05-07 DIAGNOSIS — F332 Major depressive disorder, recurrent severe without psychotic features: Secondary | ICD-10-CM | POA: Diagnosis not present

## 2022-05-07 DIAGNOSIS — F3163 Bipolar disorder, current episode mixed, severe, without psychotic features: Secondary | ICD-10-CM | POA: Diagnosis not present

## 2022-05-07 DIAGNOSIS — G629 Polyneuropathy, unspecified: Secondary | ICD-10-CM

## 2022-05-07 MED ORDER — QUETIAPINE FUMARATE 100 MG PO TABS: 100.0000 mg | ORAL_TABLET | Freq: Every day | ORAL | 1 refills | Status: DC

## 2022-05-07 MED ORDER — DULOXETINE HCL 60 MG PO CPEP: 60.0000 mg | ORAL_CAPSULE | Freq: Every day | ORAL | 0 refills | Status: DC

## 2022-05-07 MED ORDER — PREGABALIN 25 MG PO CAPS
ORAL_CAPSULE | ORAL | 1 refills | Status: DC
Start: 2022-05-07 — End: 2022-10-23
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
## 2022-05-08 ENCOUNTER — Other Ambulatory Visit: Payer: Self-pay | Admitting: Family Medicine

## 2022-05-08 ENCOUNTER — Ambulatory Visit: Payer: Medicare Other

## 2022-05-08 DIAGNOSIS — M25661 Stiffness of right knee, not elsewhere classified: Secondary | ICD-10-CM

## 2022-05-08 DIAGNOSIS — G629 Polyneuropathy, unspecified: Secondary | ICD-10-CM

## 2022-05-08 DIAGNOSIS — M6281 Muscle weakness (generalized): Secondary | ICD-10-CM

## 2022-05-08 DIAGNOSIS — R2689 Other abnormalities of gait and mobility: Secondary | ICD-10-CM

## 2022-05-08 DIAGNOSIS — R600 Localized edema: Secondary | ICD-10-CM | POA: Diagnosis not present

## 2022-05-08 DIAGNOSIS — M25561 Pain in right knee: Secondary | ICD-10-CM | POA: Diagnosis not present

## 2022-05-08 MED ORDER — DULOXETINE HCL 60 MG PO CPEP: 60.0000 mg | ORAL_CAPSULE | Freq: Every day | ORAL | 1 refills | Status: DC
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
## 2022-05-08 NOTE — Telephone Encounter (Signed)
Reviewed PDMP substance reporting system prior to prescribing opiate medications. No inconsistencies noted.  Meds ordered this encounter  Medications   DULoxetine (CYMBALTA) 60 MG capsule    Sig: Take 1 capsule (60 mg total) by mouth daily.    Dispense:  90 capsule    Refill:  1    Order Specific Question:   Supervising Provider    Answer:   Tresa Garter [[id number]]   [name]  APRN, MSN, FNP-C [facility] [address] [place], [place] [address] [phone]

## 2022-05-09 ENCOUNTER — Ambulatory Visit (HOSPITAL_COMMUNITY): Payer: Medicare Other | Admitting: Licensed Clinical Social Worker

## 2022-05-09 DIAGNOSIS — F332 Major depressive disorder, recurrent severe without psychotic features: Secondary | ICD-10-CM

## 2022-05-09 DIAGNOSIS — F3163 Bipolar disorder, current episode mixed, severe, without psychotic features: Secondary | ICD-10-CM

## 2022-05-09 DIAGNOSIS — F4310 Post-traumatic stress disorder, unspecified: Secondary | ICD-10-CM

## 2022-05-09 DIAGNOSIS — F411 Generalized anxiety disorder: Secondary | ICD-10-CM

## 2022-05-09 NOTE — Progress Notes (Signed)
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 She will call the office to see if can get an appointment earlier then next appointment

## 2022-05-10 ENCOUNTER — Encounter: Payer: Medicare Other | Admitting: Physical Therapy

## 2022-05-14 ENCOUNTER — Ambulatory Visit: Payer: Medicare Other

## 2022-05-17 ENCOUNTER — Encounter: Payer: Medicare Other | Admitting: Physical Therapy

## 2022-05-17 ENCOUNTER — Ambulatory Visit: Payer: Medicare Other | Admitting: Physical Therapy

## 2022-05-23 ENCOUNTER — Ambulatory Visit (INDEPENDENT_AMBULATORY_CARE_PROVIDER_SITE_OTHER): Payer: Medicare Other | Admitting: Licensed Clinical Social Worker

## 2022-05-23 DIAGNOSIS — F411 Generalized anxiety disorder: Secondary | ICD-10-CM

## 2022-05-23 DIAGNOSIS — F4310 Post-traumatic stress disorder, unspecified: Secondary | ICD-10-CM

## 2022-05-23 DIAGNOSIS — F332 Major depressive disorder, recurrent severe without psychotic features: Secondary | ICD-10-CM

## 2022-05-23 DIAGNOSIS — F3163 Bipolar disorder, current episode mixed, severe, without psychotic features: Secondary | ICD-10-CM | POA: Diagnosis not present
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
## 2022-05-29 ENCOUNTER — Encounter: Payer: Self-pay | Admitting: Physical Therapy

## 2022-05-29 ENCOUNTER — Ambulatory Visit: Payer: Medicare Other | Attending: Student | Admitting: Physical Therapy

## 2022-05-29 DIAGNOSIS — M6281 Muscle weakness (generalized): Secondary | ICD-10-CM | POA: Diagnosis not present

## 2022-05-29 DIAGNOSIS — M25661 Stiffness of right knee, not elsewhere classified: Secondary | ICD-10-CM | POA: Insufficient documentation

## 2022-05-29 DIAGNOSIS — R600 Localized edema: Secondary | ICD-10-CM | POA: Insufficient documentation

## 2022-05-29 DIAGNOSIS — M25561 Pain in right knee: Secondary | ICD-10-CM | POA: Diagnosis not present

## 2022-05-29 DIAGNOSIS — R2689 Other abnormalities of gait and mobility: Secondary | ICD-10-CM | POA: Diagnosis not present
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
## 2022-06-06 ENCOUNTER — Encounter (HOSPITAL_COMMUNITY): Payer: Self-pay

## 2022-06-06 ENCOUNTER — Ambulatory Visit (INDEPENDENT_AMBULATORY_CARE_PROVIDER_SITE_OTHER): Payer: Medicare Other | Admitting: Licensed Clinical Social Worker

## 2022-06-06 DIAGNOSIS — F332 Major depressive disorder, recurrent severe without psychotic features: Secondary | ICD-10-CM

## 2022-06-06 DIAGNOSIS — F4310 Post-traumatic stress disorder, unspecified: Secondary | ICD-10-CM

## 2022-06-06 DIAGNOSIS — F3163 Bipolar disorder, current episode mixed, severe, without psychotic features: Secondary | ICD-10-CM

## 2022-06-06 DIAGNOSIS — F411 Generalized anxiety disorder: Secondary | ICD-10-CM

## 2022-06-06 NOTE — Progress Notes (Addendum)
Patient contacted therapist that lost track of time why not on time. Session is a cancel

## 2022-06-12 ENCOUNTER — Encounter: Payer: Self-pay | Admitting: Physical Therapy

## 2022-06-12 ENCOUNTER — Ambulatory Visit: Payer: Medicare Other | Attending: Student | Admitting: Physical Therapy

## 2022-06-12 DIAGNOSIS — M25661 Stiffness of right knee, not elsewhere classified: Secondary | ICD-10-CM | POA: Diagnosis not present

## 2022-06-12 DIAGNOSIS — R600 Localized edema: Secondary | ICD-10-CM | POA: Insufficient documentation

## 2022-06-12 DIAGNOSIS — R2689 Other abnormalities of gait and mobility: Secondary | ICD-10-CM | POA: Insufficient documentation

## 2022-06-12 DIAGNOSIS — M6281 Muscle weakness (generalized): Secondary | ICD-10-CM | POA: Insufficient documentation

## 2022-06-12 DIAGNOSIS — M25561 Pain in right knee: Secondary | ICD-10-CM | POA: Diagnosis not present
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
## 2022-06-14 ENCOUNTER — Ambulatory Visit (INDEPENDENT_AMBULATORY_CARE_PROVIDER_SITE_OTHER): Payer: Medicare Other | Admitting: Licensed Clinical Social Worker

## 2022-06-14 ENCOUNTER — Encounter (HOSPITAL_COMMUNITY): Payer: Self-pay

## 2022-06-14 DIAGNOSIS — F3163 Bipolar disorder, current episode mixed, severe, without psychotic features: Secondary | ICD-10-CM

## 2022-06-14 DIAGNOSIS — F411 Generalized anxiety disorder: Secondary | ICD-10-CM

## 2022-06-14 DIAGNOSIS — F332 Major depressive disorder, recurrent severe without psychotic features: Secondary | ICD-10-CM | POA: Diagnosis not present

## 2022-06-14 DIAGNOSIS — F4310 Post-traumatic stress disorder, unspecified: Secondary | ICD-10-CM

## 2022-06-14 NOTE — Plan of Care (Signed)
Patient participated in completion of treatment plan

## 2022-06-14 NOTE — Plan of Care (Signed)
  Problem: PTSD-Trauma Disorder CCP Problem  1  Goal:  Decrease Depession Description: Patient says can tell she is depressed when house is a mess is a combination of different things that have not issues such as pain can do what she wants to do so ends up laying in bed. Putting the f...word Outcome: Not Progressing Goal: LTG: Reduce frequency, intensity, and duration of PTSD symptoms so daily functioning is improved: Input needed on appropriate metric.  Patient will score less than PCL-5 Outcome: Not Progressing Goal: LTG: Elimination of maladaptive behaviors and thinking patterns which interfere with resolution of trauma: Input needed on appropriate metric Outcome: Not Progressing Goal: LTG: Enhanced ability to interact with others without suspicion or defensiveness: Input needed on appropriate metric Outcome: Not Progressing Goal: STG: Naijah WILL PARTICIPATE IN as needed SESSIONS OF PROLONGED EXPOSURE (PE) THERAPY, TO INCLUDE IN VIVO AND IMAGINED EXPOSURE WITH INDIVIDUAL THERAPIST as needed  Outcome: Not Progressing Goal: STG: Rashan WILL PRACTICE EMOTION REGULATION SKILLS as needed  Outcome: Not Progressing Goal: STG: Cicily WILL EXPERIENCE A 40% REDUCTION IN EXAGGERATED BELIEFS ABOUT SELF AND OTHERS THAT INTERFERE WITH TRAUMA RESOLUTION AS EVIDENCED BY SELF-REPORT Outcome: Not Progressing
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
## 2022-06-15 ENCOUNTER — Encounter (HOSPITAL_COMMUNITY): Payer: Medicare Other | Admitting: Psychiatry

## 2022-06-15 ENCOUNTER — Encounter (HOSPITAL_COMMUNITY): Payer: Self-pay | Admitting: Psychiatry

## 2022-06-15 ENCOUNTER — Encounter (HOSPITAL_COMMUNITY): Payer: Self-pay

## 2022-06-15 NOTE — Progress Notes (Signed)
Patient ID: Cheryl Thompson, female   DOB: 1965/12/24, 56 y.o.   MRN: [id number] Patient NO SHOW

## 2022-06-19 ENCOUNTER — Ambulatory Visit: Payer: Medicare Other

## 2022-06-19 ENCOUNTER — Encounter (HOSPITAL_COMMUNITY): Payer: Self-pay

## 2022-06-19 ENCOUNTER — Ambulatory Visit (HOSPITAL_COMMUNITY): Payer: Medicare Other | Admitting: Licensed Clinical Social Worker

## 2022-06-21 ENCOUNTER — Ambulatory Visit: Payer: Medicare Other | Admitting: Physical Therapy

## 2022-06-22 ENCOUNTER — Other Ambulatory Visit: Payer: Self-pay | Admitting: Family Medicine

## 2022-06-22 ENCOUNTER — Telehealth: Payer: Self-pay | Admitting: Family Medicine

## 2022-06-22 DIAGNOSIS — I10 Essential (primary) hypertension: Secondary | ICD-10-CM

## 2022-06-22 MED ORDER — IBUPROFEN 800 MG PO TABS: 800.0000 mg | ORAL_TABLET | Freq: Three times a day (TID) | ORAL | 0 refills | Status: DC | PRN

## 2022-06-22 MED ORDER — HYDROCHLOROTHIAZIDE 25 MG PO TABS: ORAL_TABLET | ORAL | 2 refills | Status: DC

## 2022-06-22 NOTE — Progress Notes (Signed)
Meds ordered this encounter  Medications   hydrochlorothiazide (HYDRODIURIL) 25 MG tablet    Sig: TAKE 1 TABLET(25 MG) BY MOUTH DAILY FOR HIGH BLOOD PRESSURE    Dispense:  90 tablet    Refill:  2    Order Specific Question:   Supervising Provider    Answer:   Tresa Garter [[id number]]   ibuprofen (ADVIL) 800 MG tablet    Sig: Take 1 tablet (800 mg total) by mouth every 8 (eight) hours as needed.    Dispense:  30 tablet    Refill:  0    Order Specific Question:   Supervising Provider    Answer:   Tresa Garter [[id number]]   This patient will need to schedule a follow up appointment.   [name]  APRN, MSN, FNP-C Patient Cheryl Thompson [address] [place], [place] [address] [phone]

## 2022-06-22 NOTE — Telephone Encounter (Addendum)
Refill request hydrochlorothiazide and ibuprofen

## 2022-06-26 ENCOUNTER — Ambulatory Visit: Payer: Medicare Other | Attending: Student

## 2022-06-26 DIAGNOSIS — G8929 Other chronic pain: Secondary | ICD-10-CM | POA: Diagnosis not present

## 2022-06-26 DIAGNOSIS — M25661 Stiffness of right knee, not elsewhere classified: Secondary | ICD-10-CM | POA: Diagnosis not present

## 2022-06-26 DIAGNOSIS — M6281 Muscle weakness (generalized): Secondary | ICD-10-CM | POA: Diagnosis not present

## 2022-06-26 DIAGNOSIS — M545 Low back pain, unspecified: Secondary | ICD-10-CM | POA: Diagnosis not present

## 2022-06-26 DIAGNOSIS — R600 Localized edema: Secondary | ICD-10-CM | POA: Diagnosis not present

## 2022-06-26 DIAGNOSIS — R2689 Other abnormalities of gait and mobility: Secondary | ICD-10-CM | POA: Diagnosis not present

## 2022-06-26 DIAGNOSIS — M25561 Pain in right knee: Secondary | ICD-10-CM | POA: Diagnosis not present
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
## 2022-06-27 ENCOUNTER — Ambulatory Visit (INDEPENDENT_AMBULATORY_CARE_PROVIDER_SITE_OTHER): Payer: Medicare Other | Admitting: Licensed Clinical Social Worker

## 2022-06-27 DIAGNOSIS — F3163 Bipolar disorder, current episode mixed, severe, without psychotic features: Secondary | ICD-10-CM

## 2022-06-27 DIAGNOSIS — F411 Generalized anxiety disorder: Secondary | ICD-10-CM | POA: Diagnosis not present

## 2022-06-27 DIAGNOSIS — F332 Major depressive disorder, recurrent severe without psychotic features: Secondary | ICD-10-CM

## 2022-06-27 DIAGNOSIS — F4310 Post-traumatic stress disorder, unspecified: Secondary | ICD-10-CM

## 2022-06-27 NOTE — Telephone Encounter (Signed)
Yes thanks 
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
## 2022-06-28 NOTE — Telephone Encounter (Signed)
Done KH 

## 2022-06-29 ENCOUNTER — Ambulatory Visit: Payer: Medicare Other | Admitting: Physical Therapy

## 2022-06-29 ENCOUNTER — Encounter: Payer: Self-pay | Admitting: Physical Therapy

## 2022-06-29 DIAGNOSIS — M25661 Stiffness of right knee, not elsewhere classified: Secondary | ICD-10-CM | POA: Diagnosis not present

## 2022-06-29 DIAGNOSIS — M25561 Pain in right knee: Secondary | ICD-10-CM | POA: Diagnosis not present

## 2022-06-29 DIAGNOSIS — G8929 Other chronic pain: Secondary | ICD-10-CM

## 2022-06-29 DIAGNOSIS — M545 Low back pain, unspecified: Secondary | ICD-10-CM

## 2022-06-29 DIAGNOSIS — R2689 Other abnormalities of gait and mobility: Secondary | ICD-10-CM | POA: Diagnosis not present

## 2022-06-29 DIAGNOSIS — R600 Localized edema: Secondary | ICD-10-CM

## 2022-06-29 DIAGNOSIS — M6281 Muscle weakness (generalized): Secondary | ICD-10-CM
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
## 2022-07-02 ENCOUNTER — Encounter: Payer: Self-pay | Admitting: Nurse Practitioner

## 2022-07-02 ENCOUNTER — Ambulatory Visit (INDEPENDENT_AMBULATORY_CARE_PROVIDER_SITE_OTHER): Payer: Medicare Other | Admitting: Nurse Practitioner

## 2022-07-02 VITALS — BP 107/68 | HR 97 | Temp 97.9°F | Wt 214.6 lb

## 2022-07-02 DIAGNOSIS — E039 Hypothyroidism, unspecified: Secondary | ICD-10-CM

## 2022-07-02 DIAGNOSIS — R0989 Other specified symptoms and signs involving the circulatory and respiratory systems: Secondary | ICD-10-CM | POA: Diagnosis not present

## 2022-07-02 DIAGNOSIS — G40909 Epilepsy, unspecified, not intractable, without status epilepticus: Secondary | ICD-10-CM

## 2022-07-02 DIAGNOSIS — E1165 Type 2 diabetes mellitus with hyperglycemia: Secondary | ICD-10-CM | POA: Diagnosis not present

## 2022-07-02 DIAGNOSIS — F17200 Nicotine dependence, unspecified, uncomplicated: Secondary | ICD-10-CM

## 2022-07-02 DIAGNOSIS — J0100 Acute maxillary sinusitis, unspecified: Secondary | ICD-10-CM

## 2022-07-02 DIAGNOSIS — Z9109 Other allergy status, other than to drugs and biological substances: Secondary | ICD-10-CM | POA: Diagnosis not present

## 2022-07-02 DIAGNOSIS — M1711 Unilateral primary osteoarthritis, right knee: Secondary | ICD-10-CM | POA: Diagnosis not present

## 2022-07-02 DIAGNOSIS — F32A Depression, unspecified: Secondary | ICD-10-CM

## 2022-07-02 DIAGNOSIS — Z1231 Encounter for screening mammogram for malignant neoplasm of breast: Secondary | ICD-10-CM | POA: Diagnosis not present

## 2022-07-02 DIAGNOSIS — F419 Anxiety disorder, unspecified: Secondary | ICD-10-CM

## 2022-07-02 DIAGNOSIS — E781 Pure hyperglyceridemia: Secondary | ICD-10-CM | POA: Diagnosis not present

## 2022-07-02 DIAGNOSIS — I10 Essential (primary) hypertension: Secondary | ICD-10-CM

## 2022-07-02 LAB — POC COVID19 BINAXNOW: SARS Coronavirus 2 Ag: NEGATIVE

## 2022-07-02 LAB — POCT INFLUENZA A/B
Influenza A, POC: NEGATIVE
Influenza B, POC: NEGATIVE

## 2022-07-02 MED ORDER — AZITHROMYCIN 250 MG PO TABS: ORAL_TABLET | ORAL | 0 refills | Status: AC

## 2022-07-02 MED ORDER — LEVOCETIRIZINE DIHYDROCHLORIDE 5 MG PO TABS: 5.0000 mg | ORAL_TABLET | Freq: Every evening | ORAL | 11 refills | Status: DC

## 2022-07-02 MED ORDER — HYDROXYZINE HCL 25 MG PO TABS: 25.0000 mg | ORAL_TABLET | Freq: Every day | ORAL | 1 refills | Status: DC | PRN

## 2022-07-02 NOTE — Assessment & Plan Note (Signed)
Currently on levothyroxine 175 mcg 1 tablet daily Check TSH T4 levels

## 2022-07-02 NOTE — Assessment & Plan Note (Signed)
Status post right knee replacement surgery Takes Lyrica 25 mg twice daily, ibuprofen 800 mg every 8 hours as needed Continue current medications Needs assistance with housekeeping Refer for home health placed today

## 2022-07-02 NOTE — Assessment & Plan Note (Signed)
BP Readings from Last 3 Encounters:  07/02/22 107/68  04/24/22 132/84  02/27/22 (!) 154/95  Chronic medical condition currently well-controlled on hydrochlorothiazide 25 mg daily Continue current medication DASH diet advised engage in regular daily moderate exercises at least 250 minutes weekly as tolerated Follow-up in 6 months

## 2022-07-02 NOTE — Patient Instructions (Signed)

## 2022-07-02 NOTE — Assessment & Plan Note (Signed)
Need to quit smoking including risk of lung cancer, COPD, heart attack, MI was discussed with patient she verbalized understanding.

## 2022-07-02 NOTE — Assessment & Plan Note (Signed)
-   azithromycin (ZITHROMAX) 250 MG tablet; Take 2 tablets on day 1, then 1 tablet daily on days 2 through 5  Dispense: 6 tablet; Refill: 0 Levocetirizine 5 mg daily refilled

## 2022-07-02 NOTE — Assessment & Plan Note (Signed)
Currently well-controlled on Keppra 500 mgTwice daily twice daily continue current medication

## 2022-07-02 NOTE — Assessment & Plan Note (Signed)
Checking lipid panel She is intolerant to statin, she has stopped taking fenofibrate due to intolerance as well.  I discussed need for Zetia, fish oil for her hyperlipidemia and hypertriglyceridemia  when lab returns she verbalized understanding. Avoid fatty fried foods  Lab Results  Component Value Date   CHOL 183 04/24/2022   HDL 25 (L) 04/24/2022   [place]  04/24/2022     Comment:     . LDL cholesterol not calculated. Triglyceride levels greater than 400 mg/dL invalidate calculated LDL results. . Reference range: <100 . Desirable range <100 mg/dL for primary prevention;   <70 mg/dL for patients with CHD or diabetic patients  with > or = 2 CHD risk factors. Marland Kitchen LDL-C is now calculated using the Martin-Hopkins  calculation, which is a validated novel method providing  better accuracy than the Friedewald equation in the  estimation of LDL-C.  Cresenciano Genre et al. Annamaria Helling. 7741;423(95): 2061-2068  (http://education.[employer].com/faq/FAQ164)    TRIG 462 (H) 04/24/2022   CHOLHDL 7.3 (H) 04/24/2022

## 2022-07-02 NOTE — Assessment & Plan Note (Addendum)
Lab Results  Component Value Date   HGBA1C 6.0 (H) 02/15/2022    Chronic medical condition currently well-controlled checking A1c, urine microalbumin labs today Currently on metformin 500 mg twice daily Patient counseled on low-carb modified diet Signs of hypoglycemia with reviewed with the patient today Referral for diabetic eye exam placed Follow-up in 6 months
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
## 2022-07-02 NOTE — Assessment & Plan Note (Signed)
Chronic medical condition currently well-controlled Takes hydroxyzine 25 mg 3 times daily as needed for anxiety Medication was refilled On Cymbalta 60 mg daily Continue current medications She denies SI, HI    07/02/2022   11:27 AM 04/24/2022   11:03 AM 01/31/2022    4:49 PM 01/01/2022   11:12 AM 12/05/2021   10:35 AM  Depression screen PHQ 2/9  Decreased Interest 0 0   0  Down, Depressed, Hopeless 0 1   0  PHQ - 2 Score 0 1   0  Altered sleeping 3      Tired, decreased energy 0      Change in appetite 0      Feeling bad or failure about yourself  0      Trouble concentrating 0      Moving slowly or fidgety/restless 0      Suicidal thoughts 0      PHQ-9 Score 3      Difficult doing work/chores Not difficult at all         Information is confidential and restricted. Go to Review Flowsheets to unlock data.

## 2022-07-03 ENCOUNTER — Ambulatory Visit (INDEPENDENT_AMBULATORY_CARE_PROVIDER_SITE_OTHER): Payer: Medicare Other | Admitting: Licensed Clinical Social Worker

## 2022-07-03 ENCOUNTER — Other Ambulatory Visit: Payer: Self-pay | Admitting: Nurse Practitioner

## 2022-07-03 ENCOUNTER — Ambulatory Visit: Payer: Medicare Other

## 2022-07-03 DIAGNOSIS — E781 Pure hyperglyceridemia: Secondary | ICD-10-CM

## 2022-07-03 DIAGNOSIS — F411 Generalized anxiety disorder: Secondary | ICD-10-CM

## 2022-07-03 DIAGNOSIS — F4310 Post-traumatic stress disorder, unspecified: Secondary | ICD-10-CM

## 2022-07-03 DIAGNOSIS — F3163 Bipolar disorder, current episode mixed, severe, without psychotic features: Secondary | ICD-10-CM | POA: Diagnosis not present

## 2022-07-03 DIAGNOSIS — F332 Major depressive disorder, recurrent severe without psychotic features: Secondary | ICD-10-CM

## 2022-07-03 DIAGNOSIS — E039 Hypothyroidism, unspecified: Secondary | ICD-10-CM

## 2022-07-03 LAB — HEMOGLOBIN A1C
Est. average glucose Bld gHb Est-mCnc: 128 mg/dL
Hgb A1c MFr Bld: 6.1 % — ABNORMAL HIGH (ref 4.8–5.6)

## 2022-07-03 LAB — LIPID PANEL
Chol/HDL Ratio: 9 ratio — ABNORMAL HIGH (ref 0.0–4.4)
Cholesterol, Total: 189 mg/dL (ref 100–199)
HDL: 21 mg/dL — ABNORMAL LOW (ref >39)
LDL Chol Calc (NIH): 85 mg/dL (ref 0–99)
Triglycerides: 509 mg/dL — ABNORMAL HIGH (ref 0–149)
VLDL Cholesterol Cal: 83 mg/dL — ABNORMAL HIGH (ref 5–40)

## 2022-07-03 LAB — TSH+FREE T4
Free T4: 1.09 ng/dL (ref 0.82–1.77)
TSH: 0.108 u[IU]/mL — ABNORMAL LOW (ref 0.450–4.500)

## 2022-07-03 MED ORDER — LEVOTHYROXINE SODIUM 100 MCG PO TABS: 100.0000 ug | ORAL_TABLET | Freq: Every day | ORAL | 0 refills | Status: DC

## 2022-07-03 MED ORDER — ICOSAPENT ETHYL 1 G PO CAPS: 2.0000 g | ORAL_CAPSULE | Freq: Two times a day (BID) | ORAL | 3 refills | Status: DC

## 2022-07-03 NOTE — Progress Notes (Signed)
Diabetes remains well controlled.

## 2022-07-03 NOTE — Progress Notes (Signed)
Hypothyroidism is over corrected, she should start taking levothyroxine 100 mcg daily , stop the 175 mcg dose  Hypertriglyceridemia. Start vascepa 2g BID. She should let us know if she has any trouble getting this medication Eat a healthy diet, including lots of fruits and vegetables. Avoid foods with a lot of saturated and trans fats, such as red meat, butter, fried foods and cheese . Maintain a healthy weight.   Please schedule a follow up in 6 weeks to recheck her thyroid and lipid panel.  Thanks
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
## 2022-07-05 ENCOUNTER — Encounter: Payer: Medicare Other | Admitting: Physical Therapy

## 2022-07-13 DIAGNOSIS — M24561 Contracture, right knee: Secondary | ICD-10-CM | POA: Diagnosis not present

## 2022-07-13 DIAGNOSIS — Z96652 Presence of left artificial knee joint: Secondary | ICD-10-CM | POA: Diagnosis not present

## 2022-07-13 DIAGNOSIS — Z471 Aftercare following joint replacement surgery: Secondary | ICD-10-CM | POA: Diagnosis not present

## 2022-07-18 ENCOUNTER — Ambulatory Visit: Payer: 59 | Attending: Student

## 2022-07-18 DIAGNOSIS — R600 Localized edema: Secondary | ICD-10-CM | POA: Diagnosis not present

## 2022-07-18 DIAGNOSIS — M25561 Pain in right knee: Secondary | ICD-10-CM | POA: Insufficient documentation

## 2022-07-18 DIAGNOSIS — M25661 Stiffness of right knee, not elsewhere classified: Secondary | ICD-10-CM | POA: Diagnosis not present

## 2022-07-18 DIAGNOSIS — R2689 Other abnormalities of gait and mobility: Secondary | ICD-10-CM | POA: Diagnosis not present

## 2022-07-18 DIAGNOSIS — M6281 Muscle weakness (generalized): Secondary | ICD-10-CM | POA: Diagnosis not present
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
## 2022-07-19 ENCOUNTER — Ambulatory Visit (INDEPENDENT_AMBULATORY_CARE_PROVIDER_SITE_OTHER): Payer: Medicare Other | Admitting: Licensed Clinical Social Worker

## 2022-07-19 DIAGNOSIS — F3163 Bipolar disorder, current episode mixed, severe, without psychotic features: Secondary | ICD-10-CM | POA: Diagnosis not present

## 2022-07-19 DIAGNOSIS — F4310 Post-traumatic stress disorder, unspecified: Secondary | ICD-10-CM

## 2022-07-19 DIAGNOSIS — F332 Major depressive disorder, recurrent severe without psychotic features: Secondary | ICD-10-CM

## 2022-07-19 DIAGNOSIS — F411 Generalized anxiety disorder: Secondary | ICD-10-CM

## 2022-07-19 NOTE — Progress Notes (Signed)
Virtual Visit via Video Note  I connected with Cheryl Thompson on 07/19/22 at 10:00 AM EST by a video enabled telemedicine application and verified that I am speaking with the correct person using two identifiers.  Started session video but ran into technical difficulties so had to finish by phone Location: Patient: home Provider: home office   I discussed the limitations of evaluation and management by telemedicine and the availability of in person appointments. The patient expressed understanding and agreed to proceed.   I discussed the assessment and treatment plan with the patient. The patient was provided an opportunity to ask questions and all were answered. The patient agreed with the plan and demonstrated an understanding of the instructions.   The patient was advised to call back or seek an in-person evaluation if the symptoms worsen or if the condition fails to improve as anticipated.  I provided 50 minutes of non-face-to-face time during this encounter.  THERAPIST PROGRESS NOTE  Session Time: 10:00 AM to 10:50 AM  Participation Level: Active  Behavioral Response: CasualAlertDepressed  Type of Therapy: Individual Therapy  Treatment Goals addressed: Reducing trauma symptoms, reduced depression, coping  ProgressTowards Goals: Progressing-worked actively on coping skills for depression also tied in psychoeducation on trauma to help develop coping skills  Interventions: CBT, Solution Focused, Strength-based, Supportive, and Other: copoing, tauma, CPT  Summary: Cheryl Thompson is a 57 y.o. female who presents with the holiday was nice quiet and calm. What is going on a bout of depression really depressed trying to fight against what realize she gets stuck in one place and not wanting to do anything. Blame on pain on whatever. Didn't have triggers as far as PTSD but been depressed. Has been like this for awhile since before the holidays. Let a lot of things go to the side  wallowing in some stuff. Patient fasting not playing games on video and computer that is where time goes. Lay in bed play games and don't do work trying to focus on priorities let things slip. Don't know what waiting for crunch time and making herself do things need to do. Depression wants her to crawl in bed and do nothing makes it difficult. Like a battle. Out of sleep medicine. Telling herself what is the point. Why telling herself that? "Tripping". Therapist provided education on CBT see below. Patient says understand it. Fight to get through it getting things accomplished setting goals it is refreshing a new year daily determined to do things need to do. [employer] study group want to establish, teaches Sunday school. Online classes behind. Have to work on isolation. Today going to choir rehearsal. Martin Majestic to church. Going to morning meeting schedule changed so  driving but started started getting into meeting before work. It is helping. Haven't been going to meeting went to meeting last night a friend who has 73 years sober. Logged into Bible study these are things stopped doing. Going to start asking for rides to meeting in person. Got to get back to being involved. Talked about negative cognitions with trauma and patient says has the belief she has to do things by herself. Doesn't take time to ask for help and says she has got ask people for help can't do all for herself. Reviewed session and patient that it is good she recognized where at as far as depressed working on ways to combat can't give up and keep doing it.  Therapist pointed out what is the point is a stuck point and needs  to be challenged.  Therapist work with patient on symptoms of depression provided perspective with trauma can negatively impact her cognitions with persistent long-lasting beliefs about self the world relationships.  Talked about how we have stuck points and patient telling herself with the point is a stuck point.  Therapist  labeled this negative cognition as black-and-white thinking negative forecasting and practiced with patient ways to challenge that that she needs to do regularly. Tackled the critic telling herself things like I reject extreme her over generalized descriptions judgments or criticisms.  1 negative happenstance does not mean I am stuck in a never-ending pattern of defeat statements that describe me is always are never this or that are typically grossly inaccurate.  Tackling the over future arising I will stop putting for what could go wrong will not try to control the uncontrollable I will not micromanage myself or others I work in a way that is good enough and I except the existential fact that my efforts sometimes bring desired results and sometimes they do not.  Discussed work on depression is multifaceted patient's concept of keeping up with goals is a helpful strategy she will find she feels better when she continues to work on goals.  Also identified patient connecting with others important aspect of helping with depression.  Discussed keeping up with healthy habits that are part of mental health and the program.  Therapist provided supportive and strength-based interventions provided support and space for patient to talk about thoughts and feelings in session   Suicidal/Homicidal: No  Plan: Return again in 1 week.2.  Continue to work on strategies for depression look at material on negative cognitions for trauma and PTS the book and see PTSD book, look at chapter 1 for see PTSD talking about identity theft as well as looking at layers of recovery.  Diagnosis: Bipolar disorder current episode mixed, major depressive disorder recurrent severe, generalized anxiety disorder, PTSD  Collaboration of Care: Other none needed  Patient/Guardian was advised Release of Information must be obtained prior to any record release in order to collaborate their care with an outside provider. Patient/Guardian was  advised if they have not already done so to contact the registration department to sign all necessary forms in order for [place] to release information regarding their care.   Consent: Patient/Guardian gives verbal consent for treatment and assignment of benefits for services provided during this visit. Patient/Guardian expressed understanding and agreed to proceed.   Cordella Register, LCSW 07/19/2022

## 2022-07-23 ENCOUNTER — Other Ambulatory Visit (HOSPITAL_COMMUNITY): Payer: Self-pay | Admitting: Psychiatry

## 2022-07-23 ENCOUNTER — Encounter: Payer: Self-pay | Admitting: Physical Therapy

## 2022-07-23 ENCOUNTER — Ambulatory Visit: Payer: 59 | Admitting: Physical Therapy

## 2022-07-23 DIAGNOSIS — M6281 Muscle weakness (generalized): Secondary | ICD-10-CM

## 2022-07-23 DIAGNOSIS — M25661 Stiffness of right knee, not elsewhere classified: Secondary | ICD-10-CM

## 2022-07-23 DIAGNOSIS — R2689 Other abnormalities of gait and mobility: Secondary | ICD-10-CM

## 2022-07-23 DIAGNOSIS — M25561 Pain in right knee: Secondary | ICD-10-CM

## 2022-07-23 DIAGNOSIS — R600 Localized edema: Secondary | ICD-10-CM | POA: Diagnosis not present

## 2022-07-23 DIAGNOSIS — F332 Major depressive disorder, recurrent severe without psychotic features: Secondary | ICD-10-CM
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
## 2022-07-24 ENCOUNTER — Other Ambulatory Visit (HOSPITAL_COMMUNITY): Payer: Self-pay | Admitting: Psychiatry

## 2022-07-24 DIAGNOSIS — F332 Major depressive disorder, recurrent severe without psychotic features: Secondary | ICD-10-CM

## 2022-07-26 ENCOUNTER — Ambulatory Visit (INDEPENDENT_AMBULATORY_CARE_PROVIDER_SITE_OTHER): Payer: 59 | Admitting: Licensed Clinical Social Worker

## 2022-07-26 DIAGNOSIS — F411 Generalized anxiety disorder: Secondary | ICD-10-CM | POA: Diagnosis not present

## 2022-07-26 DIAGNOSIS — F3163 Bipolar disorder, current episode mixed, severe, without psychotic features: Secondary | ICD-10-CM | POA: Diagnosis not present

## 2022-07-26 DIAGNOSIS — F4310 Post-traumatic stress disorder, unspecified: Secondary | ICD-10-CM

## 2022-07-26 DIAGNOSIS — F332 Major depressive disorder, recurrent severe without psychotic features: Secondary | ICD-10-CM

## 2022-07-26 DIAGNOSIS — T466X5A Adverse effect of antihyperlipidemic and antiarteriosclerotic drugs, initial encounter: Secondary | ICD-10-CM | POA: Insufficient documentation
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
## 2022-07-27 ENCOUNTER — Ambulatory Visit: Payer: 59

## 2022-07-27 ENCOUNTER — Other Ambulatory Visit (HOSPITAL_COMMUNITY): Payer: Self-pay | Admitting: Psychiatry

## 2022-07-27 DIAGNOSIS — M25561 Pain in right knee: Secondary | ICD-10-CM | POA: Diagnosis not present

## 2022-07-27 DIAGNOSIS — M25661 Stiffness of right knee, not elsewhere classified: Secondary | ICD-10-CM | POA: Diagnosis not present

## 2022-07-27 DIAGNOSIS — F332 Major depressive disorder, recurrent severe without psychotic features: Secondary | ICD-10-CM

## 2022-07-27 DIAGNOSIS — R600 Localized edema: Secondary | ICD-10-CM

## 2022-07-27 DIAGNOSIS — R2689 Other abnormalities of gait and mobility: Secondary | ICD-10-CM | POA: Diagnosis not present

## 2022-07-27 DIAGNOSIS — M6281 Muscle weakness (generalized): Secondary | ICD-10-CM | POA: Diagnosis not present
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
## 2022-07-30 ENCOUNTER — Other Ambulatory Visit (HOSPITAL_COMMUNITY): Payer: Self-pay

## 2022-07-30 DIAGNOSIS — F332 Major depressive disorder, recurrent severe without psychotic features: Secondary | ICD-10-CM

## 2022-07-30 MED ORDER — QUETIAPINE FUMARATE 100 MG PO TABS: 100.0000 mg | ORAL_TABLET | Freq: Every day | ORAL | 1 refills | Status: DC

## 2022-07-31 ENCOUNTER — Ambulatory Visit: Payer: 59

## 2022-07-31 DIAGNOSIS — M6281 Muscle weakness (generalized): Secondary | ICD-10-CM

## 2022-07-31 DIAGNOSIS — M25561 Pain in right knee: Secondary | ICD-10-CM

## 2022-07-31 DIAGNOSIS — M25661 Stiffness of right knee, not elsewhere classified: Secondary | ICD-10-CM

## 2022-07-31 DIAGNOSIS — R2689 Other abnormalities of gait and mobility: Secondary | ICD-10-CM | POA: Diagnosis not present

## 2022-07-31 DIAGNOSIS — R600 Localized edema: Secondary | ICD-10-CM
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
## 2022-08-02 ENCOUNTER — Ambulatory Visit (INDEPENDENT_AMBULATORY_CARE_PROVIDER_SITE_OTHER): Payer: 59 | Admitting: Licensed Clinical Social Worker

## 2022-08-02 DIAGNOSIS — F3163 Bipolar disorder, current episode mixed, severe, without psychotic features: Secondary | ICD-10-CM

## 2022-08-02 DIAGNOSIS — F332 Major depressive disorder, recurrent severe without psychotic features: Secondary | ICD-10-CM

## 2022-08-02 DIAGNOSIS — F4310 Post-traumatic stress disorder, unspecified: Secondary | ICD-10-CM | POA: Diagnosis not present

## 2022-08-02 DIAGNOSIS — F411 Generalized anxiety disorder: Secondary | ICD-10-CM | POA: Diagnosis not present
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
## 2022-08-03 ENCOUNTER — Ambulatory Visit: Payer: 59

## 2022-08-07 ENCOUNTER — Ambulatory Visit: Payer: 59

## 2022-08-09 ENCOUNTER — Ambulatory Visit (INDEPENDENT_AMBULATORY_CARE_PROVIDER_SITE_OTHER): Payer: 59 | Admitting: Licensed Clinical Social Worker

## 2022-08-09 DIAGNOSIS — F411 Generalized anxiety disorder: Secondary | ICD-10-CM

## 2022-08-09 DIAGNOSIS — F4310 Post-traumatic stress disorder, unspecified: Secondary | ICD-10-CM | POA: Diagnosis not present

## 2022-08-09 DIAGNOSIS — F3163 Bipolar disorder, current episode mixed, severe, without psychotic features: Secondary | ICD-10-CM | POA: Diagnosis not present

## 2022-08-09 DIAGNOSIS — F332 Major depressive disorder, recurrent severe without psychotic features: Secondary | ICD-10-CM
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
## 2022-08-10 ENCOUNTER — Encounter: Payer: Medicare Other | Admitting: Physical Therapy

## 2022-08-15 ENCOUNTER — Ambulatory Visit (HOSPITAL_COMMUNITY): Payer: 59 | Admitting: Licensed Clinical Social Worker

## 2022-08-15 ENCOUNTER — Encounter (HOSPITAL_COMMUNITY): Payer: Self-pay

## 2022-08-15 NOTE — Progress Notes (Signed)
Therapist contacted patient through My Chart and she did not respond

## 2022-08-17 ENCOUNTER — Ambulatory Visit: Payer: 59

## 2022-08-21 ENCOUNTER — Ambulatory Visit (INDEPENDENT_AMBULATORY_CARE_PROVIDER_SITE_OTHER): Payer: 59 | Admitting: Licensed Clinical Social Worker

## 2022-08-21 DIAGNOSIS — F4310 Post-traumatic stress disorder, unspecified: Secondary | ICD-10-CM

## 2022-08-21 DIAGNOSIS — F3163 Bipolar disorder, current episode mixed, severe, without psychotic features: Secondary | ICD-10-CM | POA: Diagnosis not present

## 2022-08-21 DIAGNOSIS — F411 Generalized anxiety disorder: Secondary | ICD-10-CM | POA: Diagnosis not present

## 2022-08-21 DIAGNOSIS — F332 Major depressive disorder, recurrent severe without psychotic features: Secondary | ICD-10-CM
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
## 2022-08-23 ENCOUNTER — Telehealth (INDEPENDENT_AMBULATORY_CARE_PROVIDER_SITE_OTHER): Payer: 59 | Admitting: Psychiatry

## 2022-08-23 ENCOUNTER — Encounter (HOSPITAL_COMMUNITY): Payer: Self-pay | Admitting: Psychiatry

## 2022-08-23 DIAGNOSIS — G629 Polyneuropathy, unspecified: Secondary | ICD-10-CM

## 2022-08-23 DIAGNOSIS — F332 Major depressive disorder, recurrent severe without psychotic features: Secondary | ICD-10-CM

## 2022-08-23 MED ORDER — DULOXETINE HCL 60 MG PO CPEP: 60.0000 mg | ORAL_CAPSULE | Freq: Every day | ORAL | 1 refills | Status: DC

## 2022-08-23 MED ORDER — QUETIAPINE FUMARATE 100 MG PO TABS: 100.0000 mg | ORAL_TABLET | Freq: Every day | ORAL | 1 refills | Status: DC
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
## 2022-08-28 ENCOUNTER — Ambulatory Visit (INDEPENDENT_AMBULATORY_CARE_PROVIDER_SITE_OTHER): Payer: 59 | Admitting: Licensed Clinical Social Worker

## 2022-08-28 DIAGNOSIS — F3163 Bipolar disorder, current episode mixed, severe, without psychotic features: Secondary | ICD-10-CM

## 2022-08-28 DIAGNOSIS — F411 Generalized anxiety disorder: Secondary | ICD-10-CM

## 2022-08-28 DIAGNOSIS — F332 Major depressive disorder, recurrent severe without psychotic features: Secondary | ICD-10-CM | POA: Diagnosis not present

## 2022-08-28 DIAGNOSIS — F4310 Post-traumatic stress disorder, unspecified: Secondary | ICD-10-CM | POA: Diagnosis not present
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
## 2022-08-30 ENCOUNTER — Ambulatory Visit: Payer: 59

## 2022-08-30 ENCOUNTER — Other Ambulatory Visit: Payer: Self-pay | Admitting: Family Medicine

## 2022-08-30 DIAGNOSIS — J40 Bronchitis, not specified as acute or chronic: Secondary | ICD-10-CM

## 2022-08-30 MED ORDER — ALBUTEROL SULFATE HFA 108 (90 BASE) MCG/ACT IN AERS: 2.0000 | INHALATION_SPRAY | Freq: Four times a day (QID) | RESPIRATORY_TRACT | 3 refills | Status: DC | PRN

## 2022-08-30 NOTE — Progress Notes (Signed)
Meds ordered this encounter  Medications   albuterol (VENTOLIN HFA) 108 (90 Base) MCG/ACT inhaler    Sig: Inhale 2 puffs into the lungs every 6 (six) hours as needed for wheezing or shortness of breath.    Dispense:  18 g    Refill:  3    Order Specific Question:   Supervising Provider    Answer:   Tresa Garter [id number]   [name]  APRN, MSN, FNP-C [facility] [place] [address] [place], [place] [id number] [phone]

## 2022-09-06 ENCOUNTER — Ambulatory Visit (INDEPENDENT_AMBULATORY_CARE_PROVIDER_SITE_OTHER): Payer: 59 | Admitting: Licensed Clinical Social Worker

## 2022-09-06 DIAGNOSIS — F4310 Post-traumatic stress disorder, unspecified: Secondary | ICD-10-CM | POA: Diagnosis not present

## 2022-09-06 DIAGNOSIS — F332 Major depressive disorder, recurrent severe without psychotic features: Secondary | ICD-10-CM | POA: Diagnosis not present

## 2022-09-06 DIAGNOSIS — F411 Generalized anxiety disorder: Secondary | ICD-10-CM

## 2022-09-06 DIAGNOSIS — F3163 Bipolar disorder, current episode mixed, severe, without psychotic features: Secondary | ICD-10-CM
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
## 2022-09-07 ENCOUNTER — Ambulatory Visit: Payer: Self-pay | Admitting: Nurse Practitioner

## 2022-09-07 DIAGNOSIS — Z96653 Presence of artificial knee joint, bilateral: Secondary | ICD-10-CM | POA: Diagnosis not present

## 2022-09-07 DIAGNOSIS — Z471 Aftercare following joint replacement surgery: Secondary | ICD-10-CM | POA: Diagnosis not present

## 2022-09-10 ENCOUNTER — Ambulatory Visit (INDEPENDENT_AMBULATORY_CARE_PROVIDER_SITE_OTHER): Payer: 59 | Admitting: Nurse Practitioner

## 2022-09-10 ENCOUNTER — Encounter: Payer: Self-pay | Admitting: Nurse Practitioner

## 2022-09-10 VITALS — BP 171/95 | HR 93 | Temp 97.8°F | Ht 69.0 in | Wt 220.6 lb

## 2022-09-10 DIAGNOSIS — M549 Dorsalgia, unspecified: Secondary | ICD-10-CM | POA: Diagnosis not present

## 2022-09-10 DIAGNOSIS — E039 Hypothyroidism, unspecified: Secondary | ICD-10-CM | POA: Diagnosis not present

## 2022-09-10 DIAGNOSIS — E781 Pure hyperglyceridemia: Secondary | ICD-10-CM

## 2022-09-10 DIAGNOSIS — I10 Essential (primary) hypertension: Secondary | ICD-10-CM | POA: Diagnosis not present

## 2022-09-10 DIAGNOSIS — F17200 Nicotine dependence, unspecified, uncomplicated: Secondary | ICD-10-CM

## 2022-09-10 DIAGNOSIS — G40909 Epilepsy, unspecified, not intractable, without status epilepticus: Secondary | ICD-10-CM | POA: Diagnosis not present

## 2022-09-10 DIAGNOSIS — J309 Allergic rhinitis, unspecified: Secondary | ICD-10-CM

## 2022-09-10 MED ORDER — BENZONATATE 100 MG PO CAPS: 100.0000 mg | ORAL_CAPSULE | Freq: Three times a day (TID) | ORAL | 0 refills | Status: DC | PRN

## 2022-09-10 MED ORDER — CETIRIZINE HCL 10 MG PO TABS: 10.0000 mg | ORAL_TABLET | Freq: Every day | ORAL | 11 refills | Status: DC

## 2022-09-10 MED ORDER — CYCLOBENZAPRINE HCL 10 MG PO TABS: ORAL_TABLET | ORAL | 0 refills | Status: DC

## 2022-09-10 MED ORDER — FLUTICASONE PROPIONATE 50 MCG/ACT NA SUSP: 2.0000 | Freq: Every day | NASAL | 6 refills | Status: AC

## 2022-09-10 NOTE — Progress Notes (Signed)
Acute Office Visit  Subjective:     Patient ID: Cheryl Thompson, female    DOB: 11/23/1965, 57 y.o.   MRN: [id number]  Chief Complaint  Patient presents with   Cough    Ears and throat itching    Fatigue   Wheezing    HPI Cheryl Thompson with past medical history of chronic hepatitis C, hypothyroidism, type 2 diabetes, seizure disorder, lumbar radiculopathy, osteoarthritis of the knee, hypertriglyceridemia, bipolar disorder, upper back pain, anxiety and depression presents with complaints of sneezing, itchy throat, runny nose, stuffiness itchy ears, wheezing ,cough ongoing since her last visit.  She currently denies chest pain, fever, chills, malaise, bloody sputum, dizziness, shortness of breath smokes 1 pack of cigarettes in 2 weeks.  She has tried OTC cough medication without improvement.  Takes levocetirizine 5 mg daily.  Has chronic upper back pain that has become worse and due to her cough.  She has not taking anything for her back pain.  Currently has aching pain rated 8/10.       Review of Systems  Constitutional:  Negative for chills, diaphoresis, fever, malaise/fatigue and weight loss.  HENT:  Positive for congestion. Negative for ear discharge, ear pain, hearing loss and tinnitus.   Respiratory:  Positive for cough and wheezing. Negative for hemoptysis and shortness of breath.   Cardiovascular: Negative.   Musculoskeletal:  Positive for back pain.  Neurological: Negative.   Psychiatric/Behavioral: Negative.          Objective:    BP (!) 171/95   Pulse 93   Temp 97.8 F (36.6 C)   Ht '5\' 9"'$  (1.753 m)   Wt 220 lb 9.6 oz (100.1 kg)   SpO2 100%   BMI 32.58 kg/m    Physical Exam Constitutional:      General: She is not in acute distress.    Appearance: Normal appearance. She is not ill-appearing, toxic-appearing or diaphoretic.  HENT:     Right Ear: Tympanic membrane, ear canal and external ear normal. There is no impacted cerumen.     Left Ear: Tympanic  membrane, ear canal and external ear normal. There is no impacted cerumen.     Nose: Congestion present. No rhinorrhea.     Mouth/Throat:     Mouth: Mucous membranes are dry.     Pharynx: Oropharynx is clear. No oropharyngeal exudate or posterior oropharyngeal erythema.  Eyes:     General: No scleral icterus.       Right eye: No discharge.        Left eye: No discharge.     Extraocular Movements: Extraocular movements intact.  Cardiovascular:     Rate and Rhythm: Normal rate and regular rhythm.     Pulses: Normal pulses.     Heart sounds: Normal heart sounds.     No friction rub. No gallop.  Pulmonary:     Effort: Pulmonary effort is normal. No respiratory distress.     Breath sounds: Normal breath sounds. No stridor. No wheezing, rhonchi or rales.  Chest:     Chest wall: No tenderness.  Abdominal:     Palpations: Abdomen is soft.     Tenderness: There is no abdominal tenderness. There is no guarding.  Musculoskeletal:        General: No swelling, deformity or signs of injury.     Cervical back: No tenderness.     Right lower leg: No edema.     Left lower leg: No edema.  Skin:  General: Skin is warm and dry.     Capillary Refill: Capillary refill takes less than 2 seconds.     Coloration: Skin is not jaundiced or pale.     Findings: No bruising, erythema or lesion.  Neurological:     Mental Status: She is alert and oriented to person, place, and time.     Cranial Nerves: No cranial nerve deficit.     Motor: No weakness.     Coordination: Coordination normal.     Gait: Gait normal.  Psychiatric:        Mood and Affect: Mood normal.        Behavior: Behavior normal.        Thought Content: Thought content normal.        Judgment: Judgment normal.     No results found for any visits on 09/10/22.      Assessment & Plan:   Problem List Items Addressed This Visit       Cardiovascular and Mediastinum   Essential hypertension, benign    BP Readings from Last 3  Encounters:  09/10/22 (!) 171/95  07/02/22 107/68  04/24/22 132/84  Currently on hydrochlorothiazide 25 mg daily Stated that she has not taken her medications today She was encouraged to take her medication as soon as she gets home DASH diet advised engage in regular moderate exercises at least 250 minutes weekly as tolerated. Call the office if blood pressure remains greater then 130/80        Respiratory   Allergic rhinitis     Allergic rhinitis, unspecified seasonality, unspecified trigger  - fluticasone (FLONASE) 50 MCG/ACT nasal spray; Place 2 sprays into both nostrils daily.  Dispense: 16 g; Refill: 6 - benzonatate (TESSALON PERLES) 100 MG capsule; Take 1 capsule (100 mg total) by mouth 3 (three) times daily as needed for cough.  Dispense: 30 capsule; Refill: 0 - Ambulatory referral to Allergy - cetirizine (ZYRTEC) 10 MG tablet; Take 1 tablet (10 mg total) by mouth daily.  Dispense: 30 tablet; Refill: 11  Smoking cessation encouraged      Relevant Medications   fluticasone (FLONASE) 50 MCG/ACT nasal spray   benzonatate (TESSALON PERLES) 100 MG capsule   cetirizine (ZYRTEC) 10 MG tablet   Other Relevant Orders   Ambulatory referral to Allergy     Endocrine   Hypothyroidism    Lab Results  Component Value Date   TSH 0.108 (L) 07/02/2022  She is currently on levothyroxine 100 mcg tablets daily Check TSH T4 levels      Relevant Orders   TSH + free T4     Nervous and Auditory   Seizure disorder ([place]) - Primary    Currently has Keppra 500 mg twice daily ordered but has been taking the medication daily due to cost. She has not seen neurology in over a year. Denies any recent seizure activity Patient referred to neurology      Relevant Orders   Ambulatory referral to Neurology     Other   Tobacco dependence    Smokes about  less than 0.5 pack/day  Asked about quitting: confirms that he/she currently smokes cigarettes Advise to quit smoking: Educated about  QUITTING to reduce the risk of cancer, cardio and cerebrovascular disease. Assess willingness: Unwilling to quit at this time, but is working on cutting back. Assist with counseling and pharmacotherapy: Counseled for 5 minutes and literature provided. Arrange for follow up: follow up in 3 months and continue to offer help.  Hypertriglyceridemia    Lab Results  Component Value Date   CHOL 189 07/02/2022   HDL 21 (L) 07/02/2022   LDLCALC 85 07/02/2022   TRIG 509 (H) 07/02/2022   CHOLHDL 9.0 (H) 07/02/2022  Currently on Vascepa 2 g twice daily Check fasting lipid panel Avoid fatty fried foods      Relevant Orders   Lipid Panel   Upper back pain    Flexeril refilled in the office today She was encouraged to follow-up with orthopedics if no improvement.  - cyclobenzaprine (FLEXERIL) 10 MG tablet; TAKE 1/2 (ONE-HALF) TABLET BY MOUTH AT BEDTIME CAN  INCREASE  GRADUALLY  TO  1  TABLET  THREE  TIMES  DAILY  Dispense: 45 tablet; Refill: 0       Relevant Medications   cyclobenzaprine (FLEXERIL) 10 MG tablet    Meds ordered this encounter  Medications   cyclobenzaprine (FLEXERIL) 10 MG tablet    Sig: TAKE 1/2 (ONE-HALF) TABLET BY MOUTH AT BEDTIME CAN  INCREASE  GRADUALLY  TO  1  TABLET  THREE  TIMES  DAILY    Dispense:  45 tablet    Refill:  0   fluticasone (FLONASE) 50 MCG/ACT nasal spray    Sig: Place 2 sprays into both nostrils daily.    Dispense:  16 g    Refill:  6   benzonatate (TESSALON PERLES) 100 MG capsule    Sig: Take 1 capsule (100 mg total) by mouth 3 (three) times daily as needed for cough.    Dispense:  30 capsule    Refill:  0   cetirizine (ZYRTEC) 10 MG tablet    Sig: Take 1 tablet (10 mg total) by mouth daily.    Dispense:  30 tablet    Refill:  11    No follow-ups on file.  Renee Rival, FNP

## 2022-09-10 NOTE — Assessment & Plan Note (Signed)
Flexeril refilled in the office today She was encouraged to follow-up with orthopedics if no improvement.  - cyclobenzaprine (FLEXERIL) 10 MG tablet; TAKE 1/2 (ONE-HALF) TABLET BY MOUTH AT BEDTIME CAN  INCREASE  GRADUALLY  TO  1  TABLET  THREE  TIMES  DAILY  Dispense: 45 tablet; Refill: 0

## 2022-09-10 NOTE — Assessment & Plan Note (Signed)
Currently has Keppra 500 mg twice daily ordered but has been taking the medication daily due to cost. She has not seen neurology in over a year. Denies any recent seizure activity Patient referred to neurology

## 2022-09-10 NOTE — Assessment & Plan Note (Addendum)
BP Readings from Last 3 Encounters:  09/10/22 (!) 171/95  07/02/22 107/68  04/24/22 132/84  Currently on hydrochlorothiazide 25 mg daily Stated that she has not taken her medications today She was encouraged to take her medication as soon as she gets home DASH diet advised engage in regular moderate exercises at least 250 minutes weekly as tolerated. Call the office if blood pressure remains greater then 130/80

## 2022-09-10 NOTE — Patient Instructions (Addendum)
Seizure disorder ([place])  - Ambulatory referral to Neurology   Upper back pain Take tylenol '650mg'$  every 6 hours as needed for pain  - cyclobenzaprine (FLEXERIL) 10 MG tablet; TAKE 1/2 (ONE-HALF) TABLET BY MOUTH AT BEDTIME CAN  INCREASE  GRADUALLY  TO  1  TABLET  THREE  TIMES  DAILY  Dispense: 45 tablet; Refill: 0  Allergic rhinitis, unspecified seasonality, unspecified trigger  - fluticasone (FLONASE) 50 MCG/ACT nasal spray; Place 2 sprays into both nostrils daily.  Dispense: 16 g; Refill: 6 - benzonatate (TESSALON PERLES) 100 MG capsule; Take 1 capsule (100 mg total) by mouth 3 (three) times daily as needed for cough.  Dispense: 30 capsule; Refill: 0 - Ambulatory referral to Allergy - cetirizine (ZYRTEC) 10 MG tablet; Take 1 tablet (10 mg total) by mouth daily.  Dispense: 30 tablet; Refill: 11    It is important that you exercise regularly at least 30 minutes 5 times a week as tolerated  Think about what you will eat, plan ahead. Choose " clean, green, fresh or frozen" over canned, processed or packaged foods which are more sugary, salty and fatty. 70 to 75% of food eaten should be vegetables and fruit. Three meals at set times with snacks allowed between meals, but they must be fruit or vegetables. Aim to eat over a 12 hour period , example 7 am to 7 pm, and STOP after  your last meal of the day. Drink water,generally about 64 ounces per day, no other drink is as healthy. Fruit juice is best enjoyed in a healthy way, by EATING the fruit.  Thanks for choosing [facility] we consider it a privelige to serve you.

## 2022-09-10 NOTE — Assessment & Plan Note (Signed)
Lab Results  Component Value Date   TSH 0.108 (L) 07/02/2022  She is currently on levothyroxine 100 mcg tablets daily Check TSH T4 levels

## 2022-09-10 NOTE — Assessment & Plan Note (Signed)
Smokes about  less than 0.5 pack/day  Asked about quitting: confirms that he/she currently smokes cigarettes Advise to quit smoking: Educated about QUITTING to reduce the risk of cancer, cardio and cerebrovascular disease. Assess willingness: Unwilling to quit at this time, but is working on cutting back. Assist with counseling and pharmacotherapy: Counseled for 5 minutes and literature provided. Arrange for follow up: follow up in 3 months and continue to offer help.

## 2022-09-10 NOTE — Assessment & Plan Note (Signed)
Allergic rhinitis, unspecified seasonality, unspecified trigger  - fluticasone (FLONASE) 50 MCG/ACT nasal spray; Place 2 sprays into both nostrils daily.  Dispense: 16 g; Refill: 6 - benzonatate (TESSALON PERLES) 100 MG capsule; Take 1 capsule (100 mg total) by mouth 3 (three) times daily as needed for cough.  Dispense: 30 capsule; Refill: 0 - Ambulatory referral to Allergy - cetirizine (ZYRTEC) 10 MG tablet; Take 1 tablet (10 mg total) by mouth daily.  Dispense: 30 tablet; Refill: 11  Smoking cessation encouraged

## 2022-09-10 NOTE — Assessment & Plan Note (Signed)
Lab Results  Component Value Date   CHOL 189 07/02/2022   HDL 21 (L) 07/02/2022   LDLCALC 85 07/02/2022   TRIG 509 (H) 07/02/2022   CHOLHDL 9.0 (H) 07/02/2022  Currently on Vascepa 2 g twice daily Check fasting lipid panel Avoid fatty fried foods

## 2022-09-11 ENCOUNTER — Other Ambulatory Visit: Payer: Self-pay | Admitting: Nurse Practitioner

## 2022-09-11 DIAGNOSIS — E781 Pure hyperglyceridemia: Secondary | ICD-10-CM

## 2022-09-11 DIAGNOSIS — E785 Hyperlipidemia, unspecified: Secondary | ICD-10-CM

## 2022-09-11 LAB — TSH+FREE T4
Free T4: 1.14 ng/dL (ref 0.82–1.77)
TSH: 4.58 u[IU]/mL — ABNORMAL HIGH (ref 0.450–4.500)

## 2022-09-11 LAB — LIPID PANEL
Chol/HDL Ratio: 6.8 ratio — ABNORMAL HIGH (ref 0.0–4.4)
Cholesterol, Total: 190 mg/dL (ref 100–199)
HDL: 28 mg/dL — ABNORMAL LOW (ref >39)
LDL Chol Calc (NIH): 103 mg/dL — ABNORMAL HIGH (ref 0–99)
Triglycerides: 345 mg/dL — ABNORMAL HIGH (ref 0–149)
VLDL Cholesterol Cal: 59 mg/dL — ABNORMAL HIGH (ref 5–40)

## 2022-09-11 MED ORDER — EZETIMIBE 10 MG PO TABS: 10.0000 mg | ORAL_TABLET | Freq: Every day | ORAL | 3 refills | Status: DC

## 2022-09-11 NOTE — Progress Notes (Signed)
LDL goal is less than 70 to help decrease risk of MI, stroke patient should start taking Zetia 10 mg daily, avoid fatty fried foods.  Will check labs at next visit.  She should call the office if she has any questions about this new medication   Continue current dose of levothyroxine.   The 10-year ASCVD risk score (Arnett DK, et al., 2019) is: 69.8%   Values used to calculate the score:     Age: 57 years     Sex: Female     Is Non-Hispanic African American: Yes     Diabetic: Yes     Tobacco smoker: Yes     Systolic Blood Pressure: [id number] mmHg     Is BP treated: Yes     HDL Cholesterol: 28 mg/dL     Total Cholesterol: 190 mg/dL

## 2022-09-12 ENCOUNTER — Telehealth: Payer: Self-pay | Admitting: Nurse Practitioner

## 2022-09-12 NOTE — Telephone Encounter (Signed)
Contacted Lutie Sawada to schedule their annual wellness visit. Appointment made for 09/20/2022.  Thank you,  [place] Direct dial  [phone]

## 2022-09-13 ENCOUNTER — Ambulatory Visit (INDEPENDENT_AMBULATORY_CARE_PROVIDER_SITE_OTHER): Payer: 59 | Admitting: Licensed Clinical Social Worker

## 2022-09-13 DIAGNOSIS — F4310 Post-traumatic stress disorder, unspecified: Secondary | ICD-10-CM

## 2022-09-13 DIAGNOSIS — F3163 Bipolar disorder, current episode mixed, severe, without psychotic features: Secondary | ICD-10-CM

## 2022-09-13 DIAGNOSIS — F411 Generalized anxiety disorder: Secondary | ICD-10-CM

## 2022-09-13 DIAGNOSIS — F332 Major depressive disorder, recurrent severe without psychotic features: Secondary | ICD-10-CM
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
## 2022-09-20 ENCOUNTER — Ambulatory Visit (INDEPENDENT_AMBULATORY_CARE_PROVIDER_SITE_OTHER): Payer: 59 | Admitting: Licensed Clinical Social Worker

## 2022-09-20 ENCOUNTER — Ambulatory Visit (INDEPENDENT_AMBULATORY_CARE_PROVIDER_SITE_OTHER): Payer: 59

## 2022-09-20 VITALS — Ht 69.0 in | Wt 220.0 lb

## 2022-09-20 DIAGNOSIS — Z0000 Encounter for general adult medical examination without abnormal findings: Secondary | ICD-10-CM | POA: Diagnosis not present

## 2022-09-20 DIAGNOSIS — F411 Generalized anxiety disorder: Secondary | ICD-10-CM

## 2022-09-20 DIAGNOSIS — F4310 Post-traumatic stress disorder, unspecified: Secondary | ICD-10-CM

## 2022-09-20 DIAGNOSIS — F332 Major depressive disorder, recurrent severe without psychotic features: Secondary | ICD-10-CM

## 2022-09-20 DIAGNOSIS — F3163 Bipolar disorder, current episode mixed, severe, without psychotic features: Secondary | ICD-10-CM
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
## 2022-09-20 NOTE — Patient Instructions (Addendum)
Cheryl Thompson , Thank you for taking time to come for your Medicare Wellness Visit. I appreciate your ongoing commitment to your health goals. Please review the following plan we discussed and let me know if I can assist you in the future.   These are the goals we discussed:  Goals       Lose weight (pt-stated)      Quit smoking / using tobacco        This is a list of the screening recommended for you and due dates:  Health Maintenance  Topic Date Due   Eye exam for diabetics  Never done   Yearly kidney health urinalysis for diabetes  04/19/2021   Mammogram  12/23/2021   COVID-19 Vaccine (4 - 2023-24 season) 10/06/2022*   Zoster (Shingles) Vaccine (1 of 2) 12/21/2022*   Hemoglobin A1C  01/01/2023   Yearly kidney function blood test for diabetes  02/28/2023   Complete foot exam   09/11/2023   Medicare Annual Wellness Visit  09/20/2023   Colon Cancer Screening  01/12/2027   DTaP/Tdap/Td vaccine (3 - Td or Tdap) 06/07/2031   Hepatitis C Screening: USPSTF Recommendation to screen - Ages 58-79 yo.  Completed   HIV Screening  Completed   HPV Vaccine  Aged Out  *Topic was postponed. The date shown is not the original due date.   Opioid Pain Medicine Management Opioids are powerful medicines that are used to treat moderate to severe pain. When used for short periods of time, they can help you to: Sleep better. Do better in physical or occupational therapy. Feel better in the first few days after an injury. Recover from surgery. Opioids should be taken with the supervision of a trained health care provider. They should be taken for the shortest period of time possible. This is because opioids can be addictive, and the longer you take opioids, the greater your risk of addiction. This addiction can also be called opioid use disorder. What are the risks? Using opioid pain medicines for longer than 3 days increases your risk of side effects. Side effects include: Constipation. Nausea and  vomiting. Breathing difficulties (respiratory depression). Drowsiness. Confusion. Opioid use disorder. Itching. Taking opioid pain medicine for a long period of time can affect your ability to do daily tasks. It also puts you at risk for: Motor vehicle crashes. Depression. Suicide. Heart attack. Overdose, which can be life-threatening. What is a pain treatment plan? A pain treatment plan is an agreement between you and your health care provider. Pain is unique to each person, and treatments vary depending on your condition. To manage your pain, you and your health care provider need to work together. To help you do this: Discuss the goals of your treatment, including how much pain you might expect to have and how you will manage the pain. Review the risks and benefits of taking opioid medicines. Remember that a good treatment plan uses more than one approach and minimizes the chance of side effects. Be honest about the amount of medicines you take and about any drug or alcohol use. Get pain medicine prescriptions from only one health care provider. Pain can be managed with many types of alternative treatments. Ask your health care provider to refer you to one or more specialists who can help you manage pain through: Physical or occupational therapy. Counseling (cognitive behavioral therapy). Good nutrition. Biofeedback. Massage. Meditation. Non-opioid medicine. Following a gentle exercise program. How to use opioid pain medicine Taking medicine Take your pain medicine  exactly as told by your health care provider. Take it only when you need it. If your pain gets less severe, you may take less than your prescribed dose if your health care provider approves. If you are not having pain, do nottake pain medicine unless your health care provider tells you to take it. If your pain is severe, do nottry to treat it yourself by taking more pills than instructed on your prescription. Contact  your health care provider for help. Write down the times when you take your pain medicine. It is easy to become confused while on pain medicine. Writing the time can help you avoid overdose. Take other over-the-counter or prescription medicines only as told by your health care provider. Keeping yourself and others safe  While you are taking opioid pain medicine: Do not drive, use machinery, or power tools. Do not sign legal documents. Do not drink alcohol. Do not take sleeping pills. Do not supervise children by yourself. Do not do activities that require climbing or being in high places. Do not go to a lake, river, ocean, spa, or swimming pool. Do not share your pain medicine with anyone. Keep pain medicine in a locked cabinet or in a secure area where pets and children cannot reach it. Stopping your use of opioids If you have been taking opioid medicine for more than a few weeks, you may need to slowly decrease (taper) how much you take until you stop completely. Tapering your use of opioids can decrease your risk of symptoms of withdrawal, such as: Pain and cramping in the abdomen. Nausea. Sweating. Sleepiness. Restlessness. Uncontrollable shaking (tremors). Cravings for the medicine. Do not attempt to taper your use of opioids on your own. Talk with your health care provider about how to do this. Your health care provider may prescribe a step-down schedule based on how much medicine you are taking and how long you have been taking it. Getting rid of leftover pills Do not save any leftover pills. Get rid of leftover pills safely by: Taking the medicine to a prescription take-back program. This is usually offered by the county or law enforcement. Bringing them to a pharmacy that has a drug disposal container. Flushing them down the toilet. Check the label or package insert of your medicine to see whether this is safe to do. Throwing them out in the trash. Check the label or package  insert of your medicine to see whether this is safe to do. If it is safe to throw it out, remove the medicine from the original container, put it into a sealable bag or container, and mix it with used coffee grounds, food scraps, dirt, or cat litter before putting it in the trash. Follow these instructions at home: Activity Do exercises as told by your health care provider. Avoid activities that make your pain worse. Return to your normal activities as told by your health care provider. Ask your health care provider what activities are safe for you. General instructions You may need to take these actions to prevent or treat constipation: Drink enough fluid to keep your urine pale yellow. Take over-the-counter or prescription medicines. Eat foods that are high in fiber, such as beans, whole grains, and fresh fruits and vegetables. Limit foods that are high in fat and processed sugars, such as fried or sweet foods. Keep all follow-up visits. This is important. Where to find support If you have been taking opioids for a long time, you may benefit from receiving support for quitting  from a local support group or counselor. Ask your health care provider for a referral to these resources in your area. Where to find more information Centers for Disease Control and Prevention (CDC): http://www.wolf.info/ [place] Food and Drug Administration (FDA): [place] Get help right away if: You may have taken too much of an opioid (overdosed). Common symptoms of an overdose: Your breathing is slower or more shallow than normal. You have a very slow heartbeat (pulse). You have slurred speech. You have nausea and vomiting. Your pupils become very small. You have other potential symptoms: You are very confused. You faint or feel like you will faint. You have cold, clammy skin. You have blue lips or fingernails. You have thoughts of harming yourself or harming others. These symptoms may represent a serious problem  that is an emergency. Do not wait to see if the symptoms will go away. Get medical help right away. Call your local emergency services (911 in the [place] Do not drive yourself to the hospital.  If you ever feel like you may hurt yourself or others, or have thoughts about taking your own life, get help right away. Go to your nearest emergency department or: Call your local emergency services (911 in the [place] Call the [facility] ([phone] in the [place] Call a suicide crisis helpline, such as the [place] at (847)829-1544 or 988 in the [place]. This is open 24 hours a day in the [place] Text the Crisis Text Line at 367 716 0511 (in the [place].). Summary Opioid medicines can help you manage moderate to severe pain for a short period of time. A pain treatment plan is an agreement between you and your health care provider. Discuss the goals of your treatment, including how much pain you might expect to have and how you will manage the pain. If you think that you or someone else may have taken too much of an opioid, get medical help right away. This information is not intended to replace advice given to you by your health care provider. Make sure you discuss any questions you have with your health care provider. Document Revised: 01/25/2021 Document Reviewed: 10/12/2020 Elsevier Patient Education  [place] directives: Advance directive discussed with you today. Even though you declined this today, please call our office should you change your mind, and we can give you the proper paperwork for you to fill out.   Conditions/risks identified: None  Next appointment: Follow up in one year for your annual wellness visit.   Preventive Care 40-64 Years, Female Preventive care refers to lifestyle choices and visits with your health care provider that can promote health and wellness. What does preventive care include? A yearly physical exam. This  is also called an annual well check. Dental exams once or twice a year. Routine eye exams. Ask your health care provider how often you should have your eyes checked. Personal lifestyle choices, including: Daily care of your teeth and gums. Regular physical activity. Eating a healthy diet. Avoiding tobacco and drug use. Limiting alcohol use. Practicing safe sex. Taking low-dose aspirin daily starting at age 64. Taking vitamin and mineral supplements as recommended by your health care provider. What happens during an annual well check? The services and screenings done by your health care provider during your annual well check will depend on your age, overall health, lifestyle risk factors, and family history of disease. Counseling  Your health care provider may ask you questions about your: Alcohol use. Tobacco  use. Drug use. Emotional well-being. Home and relationship well-being. Sexual activity. Eating habits. Work and work Statistician. Method of birth control. Menstrual cycle. Pregnancy history. Screening  You may have the following tests or measurements: Height, weight, and BMI. Blood pressure. Lipid and cholesterol levels. These may be checked every 5 years, or more frequently if you are over 40 years old. Skin check. Lung cancer screening. You may have this screening every year starting at age 31 if you have a 30-pack-year history of smoking and currently smoke or have quit within the past 15 years. Fecal occult blood test (FOBT) of the stool. You may have this test every year starting at age 50. Flexible sigmoidoscopy or colonoscopy. You may have a sigmoidoscopy every 5 years or a colonoscopy every 10 years starting at age 75. Hepatitis C blood test. Hepatitis B blood test. Sexually transmitted disease (STD) testing. Diabetes screening. This is done by checking your blood sugar (glucose) after you have not eaten for a while (fasting). You may have this done every 1-3  years. Mammogram. This may be done every 1-2 years. Talk to your health care provider about when you should start having regular mammograms. This may depend on whether you have a family history of breast cancer. BRCA-related cancer screening. This may be done if you have a family history of breast, ovarian, tubal, or peritoneal cancers. Pelvic exam and Pap test. This may be done every 3 years starting at age 45. Starting at age 104, this may be done every 5 years if you have a Pap test in combination with an HPV test. Bone density scan. This is done to screen for osteoporosis. You may have this scan if you are at high risk for osteoporosis. Discuss your test results, treatment options, and if necessary, the need for more tests with your health care provider. Vaccines  Your health care provider may recommend certain vaccines, such as: Influenza vaccine. This is recommended every year. Tetanus, diphtheria, and acellular pertussis (Tdap, Td) vaccine. You may need a Td booster every 10 years. Zoster vaccine. You may need this after age 26. Pneumococcal 13-valent conjugate (PCV13) vaccine. You may need this if you have certain conditions and were not previously vaccinated. Pneumococcal polysaccharide (PPSV23) vaccine. You may need one or two doses if you smoke cigarettes or if you have certain conditions. Talk to your health care provider about which screenings and vaccines you need and how often you need them. This information is not intended to replace advice given to you by your health care provider. Make sure you discuss any questions you have with your health care provider. Document Released: 07/29/2015 Document Revised: 03/21/2016 Document Reviewed: 05/03/2015 Elsevier Interactive Patient Education  2017 [place] Prevention in the Home Falls can cause injuries. They can happen to people of all ages. There are many things you can do to make your home safe and to help prevent  falls. What can I do on the outside of my home? Regularly fix the edges of walkways and driveways and fix any cracks. Remove anything that might make you trip as you walk through a door, such as a raised step or threshold. Trim any bushes or trees on the path to your home. Use bright outdoor lighting. Clear any walking paths of anything that might make someone trip, such as rocks or tools. Regularly check to see if handrails are loose or broken. Make sure that both sides of any steps have handrails. Any raised decks  and porches should have guardrails on the edges. Have any leaves, snow, or ice cleared regularly. Use sand or salt on walking paths during winter. Clean up any spills in your garage right away. This includes oil or grease spills. What can I do in the bathroom? Use night lights. Install grab bars by the toilet and in the tub and shower. Do not use towel bars as grab bars. Use non-skid mats or decals in the tub or shower. If you need to sit down in the shower, use a plastic, non-slip stool. Keep the floor dry. Clean up any water that spills on the floor as soon as it happens. Remove soap buildup in the tub or shower regularly. Attach bath mats securely with double-sided non-slip rug tape. Do not have throw rugs and other things on the floor that can make you trip. What can I do in the bedroom? Use night lights. Make sure that you have a light by your bed that is easy to reach. Do not use any sheets or blankets that are too big for your bed. They should not hang down onto the floor. Have a firm chair that has side arms. You can use this for support while you get dressed. Do not have throw rugs and other things on the floor that can make you trip. What can I do in the kitchen? Clean up any spills right away. Avoid walking on wet floors. Keep items that you use a lot in easy-to-reach places. If you need to reach something above you, use a strong step stool that has a grab  bar. Keep electrical cords out of the way. Do not use floor polish or wax that makes floors slippery. If you must use wax, use non-skid floor wax. Do not have throw rugs and other things on the floor that can make you trip. What can I do with my stairs? Do not leave any items on the stairs. Make sure that there are handrails on both sides of the stairs and use them. Fix handrails that are broken or loose. Make sure that handrails are as long as the stairways. Check any carpeting to make sure that it is firmly attached to the stairs. Fix any carpet that is loose or worn. Avoid having throw rugs at the top or bottom of the stairs. If you do have throw rugs, attach them to the floor with carpet tape. Make sure that you have a light switch at the top of the stairs and the bottom of the stairs. If you do not have them, ask someone to add them for you. What else can I do to help prevent falls? Wear shoes that: Do not have high heels. Have rubber bottoms. Are comfortable and fit you well. Are closed at the toe. Do not wear sandals. If you use a stepladder: Make sure that it is fully opened. Do not climb a closed stepladder. Make sure that both sides of the stepladder are locked into place. Ask someone to hold it for you, if possible. Clearly mark and make sure that you can see: Any grab bars or handrails. First and last steps. Where the edge of each step is. Use tools that help you move around (mobility aids) if they are needed. These include: Canes. Walkers. Scooters. Crutches. Turn on the lights when you go into a dark area. Replace any light bulbs as soon as they burn out. Set up your furniture so you have a clear path. Avoid moving your furniture around. If  any of your floors are uneven, fix them. If there are any pets around you, be aware of where they are. Review your medicines with your doctor. Some medicines can make you feel dizzy. This can increase your chance of falling. Ask  your doctor what other things that you can do to help prevent falls. This information is not intended to replace advice given to you by your health care provider. Make sure you discuss any questions you have with your health care provider. Document Released: 04/28/2009 Document Revised: 12/08/2015 Document Reviewed: 08/06/2014 Elsevier Interactive Patient Education  2017 [employer].

## 2022-09-26 ENCOUNTER — Ambulatory Visit (INDEPENDENT_AMBULATORY_CARE_PROVIDER_SITE_OTHER): Payer: 59 | Admitting: Licensed Clinical Social Worker

## 2022-09-26 ENCOUNTER — Encounter (HOSPITAL_COMMUNITY): Payer: Self-pay

## 2022-09-26 DIAGNOSIS — F4310 Post-traumatic stress disorder, unspecified: Secondary | ICD-10-CM

## 2022-09-26 DIAGNOSIS — F3163 Bipolar disorder, current episode mixed, severe, without psychotic features: Secondary | ICD-10-CM

## 2022-09-26 DIAGNOSIS — F411 Generalized anxiety disorder: Secondary | ICD-10-CM

## 2022-09-26 NOTE — Progress Notes (Signed)
Therapist contacted patient through My Chart and she did not show 

## 2022-10-04 ENCOUNTER — Ambulatory Visit (INDEPENDENT_AMBULATORY_CARE_PROVIDER_SITE_OTHER): Payer: 59 | Admitting: Licensed Clinical Social Worker

## 2022-10-04 DIAGNOSIS — F3163 Bipolar disorder, current episode mixed, severe, without psychotic features: Secondary | ICD-10-CM

## 2022-10-04 DIAGNOSIS — F332 Major depressive disorder, recurrent severe without psychotic features: Secondary | ICD-10-CM | POA: Diagnosis not present

## 2022-10-04 DIAGNOSIS — F411 Generalized anxiety disorder: Secondary | ICD-10-CM | POA: Diagnosis not present

## 2022-10-04 DIAGNOSIS — F4310 Post-traumatic stress disorder, unspecified: Secondary | ICD-10-CM | POA: Diagnosis not present
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
## 2022-10-11 ENCOUNTER — Ambulatory Visit (INDEPENDENT_AMBULATORY_CARE_PROVIDER_SITE_OTHER): Payer: 59 | Admitting: Licensed Clinical Social Worker

## 2022-10-11 DIAGNOSIS — F4310 Post-traumatic stress disorder, unspecified: Secondary | ICD-10-CM | POA: Diagnosis not present

## 2022-10-11 DIAGNOSIS — F3163 Bipolar disorder, current episode mixed, severe, without psychotic features: Secondary | ICD-10-CM | POA: Diagnosis not present

## 2022-10-11 DIAGNOSIS — F332 Major depressive disorder, recurrent severe without psychotic features: Secondary | ICD-10-CM

## 2022-10-11 DIAGNOSIS — F411 Generalized anxiety disorder: Secondary | ICD-10-CM
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
## 2022-10-16 ENCOUNTER — Ambulatory Visit
Admission: RE | Admit: 2022-10-16 | Discharge: 2022-10-16 | Disposition: A | Discharge status: Home / Self Care | Payer: 59 | Source: Ambulatory Visit | Attending: Nurse Practitioner | Admitting: Nurse Practitioner

## 2022-10-16 DIAGNOSIS — Z1231 Encounter for screening mammogram for malignant neoplasm of breast: Secondary | ICD-10-CM

## 2022-10-18 ENCOUNTER — Ambulatory Visit (INDEPENDENT_AMBULATORY_CARE_PROVIDER_SITE_OTHER): Payer: 59 | Admitting: Licensed Clinical Social Worker

## 2022-10-18 DIAGNOSIS — F411 Generalized anxiety disorder: Secondary | ICD-10-CM

## 2022-10-18 DIAGNOSIS — F3163 Bipolar disorder, current episode mixed, severe, without psychotic features: Secondary | ICD-10-CM | POA: Diagnosis not present

## 2022-10-18 DIAGNOSIS — F4310 Post-traumatic stress disorder, unspecified: Secondary | ICD-10-CM | POA: Diagnosis not present

## 2022-10-18 DIAGNOSIS — F332 Major depressive disorder, recurrent severe without psychotic features: Secondary | ICD-10-CM

## 2022-10-18 NOTE — Progress Notes (Signed)
Virtual Visit via Video Note  I connected with Cheryl Thompson on 10/18/22 at 11:00 AM EDT by a video enabled telemedicine application and verified that I am speaking with the correct person using two identifiers.  Location: Patient: home Provider: home office   I discussed the limitations of evaluation and management by telemedicine and the availability of in person appointments. The patient expressed understanding and agreed to proceed.  History of Present Illness:    Observations/Objective:   Assessment and Plan:   Follow Up Instructions:    I discussed the assessment and treatment plan with the patient. The patient was provided an opportunity to ask questions and all were answered. The patient agreed with the plan and demonstrated an understanding of the instructions.   The patient was advised to call back or seek an in-person evaluation if the symptoms worsen or if the condition fails to improve as anticipated.  I provided 53 minutes of non-face-to-face time during this encounter.  THERAPIST PROGRESS NOTE  Session Time: 11:00 AM to 11:53 AM  Participation Level: Active  Behavioral Response: CasualAlertEuthymic  Type of Therapy: Individual Therapy  Treatment Goals addressed: Reduce  trauma symptoms, reduce depression, coping  ProgressTowards Goals: Progressing-patient actively working on herself working on ways to manage triggers and healthy ways, as therapist noted unfolding her potential with her activities that trauma may have stalled, looking at stuck points to challenge  Interventions: Solution Focused, Strength-based, Supportive, and Other: trauma, copoing  Summary: Cheryl Thompson is a 57 y.o. female who presents with hanging in there. Doing ok today. Today is brother's birthday he would have been 85. He passed. Patient was in full flown addiction in [place] and didn't come for his funeral. Mom said shouldn't be there in [place] and she was right. Married to  first husband and he was transferred to [place]. Broke up and was gone mentally and was in denial didn't believe that he had passed didn't go to deny reality does amends every day for him and mom. Helps her stay sober on the rough days. He was HIV positive ate muscles shell bad reaction and got himself sick bad reaction and killed him. When HIV come out was a death sentence didn't know much about it. 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 Still not wanted or in a relationship. Patient says not going anywhere. Can't transmit it to anybody but not ready for a relationship still finding out about her. Right now patient thinks what is the purpose not her thing truly learning who she is. A child of God tries not to have anything come between her relationship and God. Don't believe get married soon, not happening soon committed to herself and feels pretty good and don't see a reason mess up. Has commitment with recovery program. Family doesn't understand in order to stay sober have to do not ready to put anything in jeopardy not worth it.  Goes in the morning recovery before recovery and may listen a while on radio while driving also also try to make Saturday in person and Monday night meeting. Has a lot going on. Need more meetings.  They still grieve him as a family and mom. Today is a day to remember them big part of their lives. Talked to sister this morning. We miss them badly to this day. Brother gone thirty years. Think about them every day. They are going to do something like a cook out. Mom had a birthday every year for her brother.  Mom an alcoholic. Kids and patient did not love her less. Didn't understand why not with brother and sisters growing up. Before she died talked about a lot of stuff wit her. Didn't give them to grandmother she took them. Mom went through a lot grandmother put brother and sisters in foster care 6 years. Before were really close afterwards wasn't. Now siblings  really close. Even if Hilliard Clark gets on nerves and clash believe because similar except he is not on medication not in recovery. Sees him having a problem with marijuana. Smoking in their [place] patient said disrespectful she didn't say anything, put the window down and turned up the music although very angry about it. Really contemplating leaving job told him get license to take route. Don't want to work there anymore. Getting ready to start process for [employer] once re-established not going to work anymore. Can't do it messing with her. Flight syndrome done with fighting have to remove herself from the situation. Enjoys her job but not time for extra. Glad week off and this week is short and tired of that. He can't change not that he is not willing praying for him to have open mind he is close minded and negative. No sense in acting out just do her part. Not worth it. Not worth getting upset won't change behavior only makes it worse. Then they are at blows. Can't quit job and not have no way to pay bills. This is only small part-time job only pays over $3000.Marland Kitchen  Has been going to church all life but only having a relationship learned that in [place]. Grateful for that thought had to be perfect far from perfect so figured going to hell and go with bang. April 17 God opened her eyes why her sobriety date asked God to help her. It has been hard but awesome. Tried to kill herself almost succeeded that is how bad it was. Took a bunch of pills and drank did crack and weed until passed out. When came too a girl looking at her funny said don't know what going on with you but need help. Patient finished what she had and then dialed 911. Basically when got help. Couldn't take it and ready to die didn't care. [place] helped her to get into [place]. Still don't trust anybody still guarded and why don't have relationship has some relationships but only can go so far. Trust worthy relationship wiht sponsor and with big sister in program.  Have a lot of people but only sponsor and therapist open up to. Therapist noted this helps to start working on it.  Reviewed session got that there are different levels of recovery not in some but working on some. Some answers see where stuck at some place at places where it comes to relationship   Therapist provided space and support for patient to process thoughts and feelings helpful for her to have space to talk about loss of her brother and mom how it affects her how important part they play still in her lives she makes amends every day will probably do something to commemorate them this weekend therapist noted these activities of commemorate should help with grieving.  Noted the unexpected and unlikely way lost brother makes it particularly hard but therapist can relate to grieving when we lose loved ones the grief does not disappear.  Noted the way the program helps patient helps her to work on herself helps her coping with stressors.  Talked about stressor of brother and  as things are going seems she is moving more more toward letting go of job when it can be a trigger.  Processing for patient to come to [facility] and making a healthy decision for her.  Therapist introduced more CPT concepts in patient's case issue still around safety and trust.  Therapist reviewed possible stuck points and challenges Focused on patient's stuck points related to others she does not trust anybody discussed some ways of reframing to help move her from the stuck point such as there is some people out there who are dangerous but not everyone is out to harm me in someway.  There may be some people who will try to harm me but not every when I meet will hurt me I can take precautions to reduce the likelihood that others can hurt me patient recognizes has stuck points around trust issues therapist noted she is working on that with the program that even opening up to sponsoring this therapist as a way for her to be working on this  issue.  Therapist provided active listening open questions supportive interventions.  Also shared more education about trauma work that it is layers of healing and some layers patient is working on additionally it is repairing to get needs met that were not met because of developmental delays because of trauma Suicidal/Homicidal: No  Plan: Return again in 1 week.2.Look more at CPT safety and trust modules, CPTSD book p.17 (So many Symptoms How to Focus on Healing)  Diagnosis: Bipolar disorder current episode mixed, major depressive disorder recurrent severe, generalized anxiety disorder, PTSD  Collaboration of Care: Other none needed  Patient/Guardian was advised Release of Information must be obtained prior to any record release in order to collaborate their care with an outside provider. Patient/Guardian was advised if they have not already done so to contact the registration department to sign all necessary forms in order for [place] to release information regarding their care.   Consent: Patient/Guardian gives verbal consent for treatment and assignment of benefits for services provided during this visit. Patient/Guardian expressed understanding and agreed to proceed.   Cordella Register, LCSW 10/18/2022

## 2022-10-22 ENCOUNTER — Ambulatory Visit: Payer: 59 | Admitting: Internal Medicine

## 2022-10-23 ENCOUNTER — Other Ambulatory Visit (HOSPITAL_COMMUNITY): Payer: Self-pay | Admitting: Psychiatry

## 2022-10-23 ENCOUNTER — Ambulatory Visit: Payer: Medicare Other | Admitting: Internal Medicine

## 2022-10-23 ENCOUNTER — Other Ambulatory Visit: Payer: Self-pay | Admitting: Nurse Practitioner

## 2022-10-23 DIAGNOSIS — E1165 Type 2 diabetes mellitus with hyperglycemia: Secondary | ICD-10-CM

## 2022-10-29 ENCOUNTER — Encounter: Payer: Self-pay | Admitting: *Deleted

## 2022-11-01 ENCOUNTER — Other Ambulatory Visit: Payer: Self-pay

## 2022-11-01 DIAGNOSIS — E781 Pure hyperglyceridemia: Secondary | ICD-10-CM

## 2022-11-01 MED ORDER — ICOSAPENT ETHYL 1 G PO CAPS: 2.0000 g | ORAL_CAPSULE | Freq: Two times a day (BID) | ORAL | 1 refills | Status: DC

## 2022-11-02 ENCOUNTER — Ambulatory Visit (INDEPENDENT_AMBULATORY_CARE_PROVIDER_SITE_OTHER): Payer: 59 | Admitting: Licensed Clinical Social Worker

## 2022-11-02 DIAGNOSIS — F3163 Bipolar disorder, current episode mixed, severe, without psychotic features: Secondary | ICD-10-CM

## 2022-11-02 DIAGNOSIS — F411 Generalized anxiety disorder: Secondary | ICD-10-CM | POA: Diagnosis not present

## 2022-11-02 DIAGNOSIS — F4310 Post-traumatic stress disorder, unspecified: Secondary | ICD-10-CM

## 2022-11-02 DIAGNOSIS — F332 Major depressive disorder, recurrent severe without psychotic features: Secondary | ICD-10-CM
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
## 2022-11-07 ENCOUNTER — Ambulatory Visit (INDEPENDENT_AMBULATORY_CARE_PROVIDER_SITE_OTHER): Payer: 59 | Admitting: Licensed Clinical Social Worker

## 2022-11-07 DIAGNOSIS — F411 Generalized anxiety disorder: Secondary | ICD-10-CM

## 2022-11-07 DIAGNOSIS — F3163 Bipolar disorder, current episode mixed, severe, without psychotic features: Secondary | ICD-10-CM

## 2022-11-07 DIAGNOSIS — F332 Major depressive disorder, recurrent severe without psychotic features: Secondary | ICD-10-CM

## 2022-11-07 DIAGNOSIS — F4310 Post-traumatic stress disorder, unspecified: Secondary | ICD-10-CM | POA: Diagnosis not present

## 2022-11-07 NOTE — Progress Notes (Signed)
l °

## 2022-11-07 NOTE — Progress Notes (Signed)
Virtual Visit via Video Note  I connected with Cheryl Thompson on 11/07/22 at 10:00 AM EDT by a video enabled telemedicine application and verified that I am speaking with the correct person using two identifiers.  Location: Patient: home Provider: home office   I discussed the limitations of evaluation and management by telemedicine and the availability of in person appointments. The patient expressed understanding and agreed to proceed.  I discussed the assessment and treatment plan with the patient. The patient was provided an opportunity to ask questions and all were answered. The patient agreed with the plan and demonstrated an understanding of the instructions.   The patient was advised to call back or seek an in-person evaluation if the symptoms worsen or if the condition fails to improve as anticipated.  I provided 25 minutes of non-face-to-face time during this encounter.  THERAPIST PROGRESS NOTE  Session Time: 10:00 AM to 10:25 AM  Participation Level: Active  Behavioral Response: CasualAlertnot feeling well related to pain issues  Type of Therapy: Individual Therapy  Treatment Goals addressed: Reduce  trauma symptoms, reduce depression, coping  ProgressTowards Goals: Progressing-patient is committed to therapy as she struggles with stressors using therapy as another coping strategy for managing stressors  Interventions: Solution Focused, Strength-based, Supportive, and Other: coping  Summary: Cheryl Thompson is a 57 y.o. female who presents with ok but tired. She has been running around tired from her  weekend had a cook out to celebrate her 8 year anniversary busy trying to sleep only thing trying to do sleep. Not trouble sleeping taking medicine don't want to get out of bed. Therapist noted her impression patient is very busy. She added more meetings Monday Wednesday and Saturday. Right now in a lot of pain. Doesn't have a clue not sure why but tired back is hurting  really badly Joint swollen achy and spasm. Patient has really high blood pressure, seizures rheumatid arthritis that is crazy.  Therapist thinks from symptoms rheumatoid arthritis is probably acting up also fact that she has been reporting physical problems because of all the driving.  Patient thinks she is not active as far as exercising only been driving taken a toll. Tired of driving it is a lot. Always in pain today a little harsh. Take over the counter meds. Takes Tylenol Flexeril, Cymbalta and seizure medicine help with pain but some of meds also cause her to be sleepy have to be careful because can't drive when take them. After visit hot shower rub down afternoon routine take brother to work, pick up [employer] then drive, visit a girl a friend who had open heart surgery.  Cut therapy session shorter because of pain issues per patient therapist appreciated her checking in for therapy session  Patient not feeling well reviewed medical symptoms include rheumatoid arthritis therapist noted ways she is pushing herself probably has escalated symptoms therapist notes she is very busy as a good thing but it can have an impact on her body.  Noted the driving as patient realizes getting tired of that driving at the expense of exercise so assess identifying this will help patient be reflective of making good choices for herself what is best for her.  Therapist brought up patient's positive approach of making the most of things that made an impression of therapists even though at the same time can wear her down.  Patient may need to reevaluate her schedule to slow down a bit.  Short because patient not feeling well therapist encouraged her  with taking her meds that do not make her sleepy she plans to also take a hot shower to help with her physical symptoms.  Therapist provided support and space for patient to talk about thoughts and feelings in session.  Suicidal/Homicidal: No  Plan: Return again in 1 week.2.CPTSD book  page 17 (so many symptoms how to focus on healing, CPT safety and trust modules.3.Patient has Junious Silk book for her to read and discuss in therapy as well as handout therapist sent  Diagnosis: No diagnosis found.  Collaboration of Care: Other none needed  Patient/Guardian was advised Release of Information must be obtained prior to any record release in order to collaborate their care with an outside provider. Patient/Guardian was advised if they have not already done so to contact the registration department to sign all necessary forms in order for [place] to release information regarding their care.   Consent: Patient/Guardian gives verbal consent for treatment and assignment of benefits for services provided during this visit. Patient/Guardian expressed understanding and agreed to proceed.   Coolidge Breeze, LCSW 11/07/2022

## 2022-11-15 ENCOUNTER — Ambulatory Visit (INDEPENDENT_AMBULATORY_CARE_PROVIDER_SITE_OTHER): Payer: 59 | Admitting: Licensed Clinical Social Worker

## 2022-11-15 DIAGNOSIS — F332 Major depressive disorder, recurrent severe without psychotic features: Secondary | ICD-10-CM

## 2022-11-15 DIAGNOSIS — F3163 Bipolar disorder, current episode mixed, severe, without psychotic features: Secondary | ICD-10-CM

## 2022-11-15 DIAGNOSIS — F4310 Post-traumatic stress disorder, unspecified: Secondary | ICD-10-CM | POA: Diagnosis not present

## 2022-11-15 DIAGNOSIS — F411 Generalized anxiety disorder: Secondary | ICD-10-CM | POA: Diagnosis not present

## 2022-11-15 NOTE — Progress Notes (Signed)
Virtual Visit via Video Note  I connected with Cheryl Thompson on 11/15/22 at 10:00 AM EDT by a video enabled telemedicine application and verified that I am speaking with the correct person using two identifiers.  Location: Patient: home Provider: home office   I discussed the limitations of evaluation and management by telemedicine and the availability of in person appointments. The patient expressed understanding and agreed to proceed.  I discussed the assessment and treatment plan with the patient. The patient was provided an opportunity to ask questions and all were answered. The patient agreed with the plan and demonstrated an understanding of the instructions.   The patient was advised to call back or seek an in-person evaluation if the symptoms worsen or if the condition fails to improve as anticipated.  I provided 52 minutes of non-face-to-face time during this encounter.  THERAPIST PROGRESS NOTE  Session Time: 10:00 AM to 10:52 AM  Participation Level: Active  Behavioral Response: CasualAlerteuthymic in session but talked about irritability she needed to work through  Type of Therapy: Individual Therapy  Treatment Goals addressed: educe  trauma symptoms, reduce depression, coping  ProgressTowards Goals: Progressing-floored source of irritability as well as processing thoughts and feelings to help work through the feeling help with coping  Interventions: Solution Focused, Strength-based, Supportive, and Other: Coping  Summary: Cheryl Thompson is a 57 y.o. female who presents with kind of emotional doesn't know why. Therapist guided her to explore this. Brother good this week not him but her. Goes to three meetings more if possible went last night and Sunday. Therapist pointed out meetings help and patient said yes. Patient finds agitated and nobody is doing anything reason knows it is her. Uses the expression bless them change me. Knows it is her. Feel the negative stuff  probably rehearsal day and praying ready by the 1st of June. 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 Went to a different church got a dance group will be part of it. Needs more actors. Need young man and young woman some extras gang members. Praying to God send her the right person. 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 Getting ready to send out letters to the church needs some actors. Word out and haven't had any responses. Therapist noted doing a lot puts a lot on her but at same time good causes so doing a good thing.  Went to another church refreshed her spirit. It was youth Sunday celebrated youth everything powerful the holy spirit moving. Young man prophetically talking to her and was ready to say screw it with the play, non profit feels fighting and his message was not give up sometimes have to fight. Need to widen circle. In her circle have support if they have something want to do. Tired but knows labor not in vain but gets complicated sometimes and thinks this sucks. At the same time knows can't give up truly believe that is her calling. Helping people everything is for nonprofit funding herself at a point needs help with the funding have people who need help with bills doesn't have enough funding to help people it is a donation thing. It gets frustrating.  Gets frustrated brother always has something planned interferes with her plan don't know to last minute gets her mad. Therapist agrees should be more respectful of her time at same time understanding siblings are imperfect.  Brother car still not working thinks happen for  a reason still don't have a license when get it will be driving legally. Therapist noted her perspective helpful reframing positive is helpful but also accurate. Patient says thought can this process hurry up interfering in her life.  Shared something she heard from T.D. Joslyn Hy make a list of all the people need you everything you do  that you are there for them. After list make a list of people that feed you, praise for you help you when you need help that feed your spirit gives back and helps you. List of people need you long and list people feed you is short have to work on getting more people to feed me. The people who need her could do for themselves. Why irritated with brother he is waiting to criticize what triggers in her that little girl in her grandmother instead of saying good job takes her back to perfection if make one mistake then that it is had to be perfect looking for the one thing that isn't right. He saw one nut in the [place] and said thought cleaned out that bothers patient meanwhile she is the one cleaning out the [place]. That is the little girl in her. Couldn't argue with him on a mission not going to miss appointment today. Their plans are more important and it is about b.s can't wait for brother to get license and has encouraged Shawn to get license talk smack but get license can drive Zenaida Niece hell and back don't care.  Passed out doing good all day. In [place] had to pull over and passed out. Had to have brother drive don't know what that was about. That was Monday still out of it eyes almost closed and weak looking. Went to rehearsal and after got in the bed didn't wake up until 5:00 AM Tuesday morning. Now won't take med scared what it will be. Takes at night.  Brother take back and forth to work is positive.Why exhausted putting out not taking time to get filled up. Have to read Bible and other readings that helps with that. Miss Cordelia feeds her spirit work both ways need each other and feed each other. Able to talk and encourage each other. If issues and problems call her first for advice encourage patient and patient encourages her. Has more than two people also feeds her spirit getting ready for Sunday school, studying for school, her cousin like a sister encourage each other. Her son is a Chief Financial Officer. Has to see what is going on  after appointment. Trouble at school. He is on punishment for week tired of going to school 3-4 times a month because of his behavior.  Needs this appointment part of process doesn't trust a lot of people facade and come to therapy to talk about it.  Therapist noted how she had done this today talking about how she was feeling earlier.  Gets fed up with people then want to isolate. Passed out want to sleep and everybody kept calling her told them not feeling good. Feels debt to family because of behavior of past sponsor said already made amends don't have to keep making amends and not living like that anymore hard saying no but learning. Therapist is good for her too to be healthy to take care of herself by saying no. Shared hearing from T.D.Joslyn Hy when a person goes between a person and God nothing you can do don't have that type of power and put in a a position to get hurt or disappointed or  both step out of the way let God do what he needs to do. Supply limited therapist noted also needs to take care of herself.   Patient shared not in a good place that was good to have appointment where she could process with therapist.  We explored the sources 1 of which is working to get things set up for the play unresolved issues such as who are young actors are going to be finding a lot on her plate.  Noted helpfulness of going to a service to lift her spirit to remind her not to give out.  Therapist as well said she is doing a lot of good helping people so has a significant purpose to continue.  Noted patient's own positive reframing that helps with coping things happening for reason helps with negative things happen to put them in a healthier frame as well as more accurate.  Other issue is brothers who she does a lot for know she needs to continue to work on setting up boundaries that she does not have to keep making amends and she is getting better at it.  Patient brought up having people that feed you as well as you  feeding them is important, identified them in session therapist able to see some good supports in patient's life and as she says helpful to keep building that along with other ways she is fed through readings, through getting Sunday school ready through school through reading her spiritual literature.  Patient points out therapy is also part of her process helpful.  Talked about one of her triggers her brother triggered her and perfectionist thinking that brings her back to when young assess helpful for patient to talk about to work through feelings knowing perfectionism is not healthy.  Therapist provided space and support for patient to talk about thoughts and feelings in session.          Suicidal/Homicidal: No  Plan: Return again in 1 week.2.  Processed thoughts and feelings can look at page 17 and complex trauma book  Diagnosis: Bipolar disorder current episode mixed, major depressive disorder recurrent severe, generalized anxiety disorder, PTSD  Collaboration of Care: Other none needed  Patient/Guardian was advised Release of Information must be obtained prior to any record release in order to collaborate their care with an outside provider. Patient/Guardian was advised if they have not already done so to contact the registration department to sign all necessary forms in order for [place] to release information regarding their care.   Consent: Patient/Guardian gives verbal consent for treatment and assignment of benefits for services provided during this visit. Patient/Guardian expressed understanding and agreed to proceed.   Coolidge Breeze, LCSW 11/15/2022

## 2022-11-20 ENCOUNTER — Encounter: Payer: Self-pay | Admitting: Internal Medicine

## 2022-11-20 ENCOUNTER — Ambulatory Visit (INDEPENDENT_AMBULATORY_CARE_PROVIDER_SITE_OTHER): Payer: 59 | Admitting: Internal Medicine

## 2022-11-20 VITALS — BP 154/92 | HR 88 | Temp 97.6°F | Resp 18 | Ht 68.5 in | Wt 223.9 lb

## 2022-11-20 DIAGNOSIS — J310 Chronic rhinitis: Secondary | ICD-10-CM

## 2022-11-20 DIAGNOSIS — J329 Chronic sinusitis, unspecified: Secondary | ICD-10-CM

## 2022-11-20 DIAGNOSIS — J3089 Other allergic rhinitis: Secondary | ICD-10-CM

## 2022-11-20 DIAGNOSIS — H1045 Other chronic allergic conjunctivitis: Secondary | ICD-10-CM

## 2022-11-20 MED ORDER — MONTELUKAST SODIUM 10 MG PO TABS: 10.0000 mg | ORAL_TABLET | Freq: Every day | ORAL | 5 refills | Status: DC

## 2022-11-20 MED ORDER — IPRATROPIUM BROMIDE 0.06 % NA SOLN: 2.0000 | Freq: Four times a day (QID) | NASAL | 12 refills | Status: DC

## 2022-11-20 MED ORDER — AZELASTINE-FLUTICASONE 137-50 MCG/ACT NA SUSP: 1.0000 | Freq: Two times a day (BID) | NASAL | 5 refills | Status: AC

## 2022-11-20 NOTE — Patient Instructions (Signed)
Chronic Rhinitis Seasonal and Perennial Allergic with nonallergic component  - allergy testing today: positive to ragweed, mold, cat  - Prevention:  - allergen avoidance when possible - consider allergy shots as long term control of your symptoms by teaching your immune system to be more tolerant of your allergy triggers  - Symptom control: - Start  Dymista  1-2 sprays in each nostril twice a day as needed for nasal congestion/itchy nose - Start Atrovent (Ipratropium Bromide) 1-2 sprays in each nostril up to 3 times a day as needed for runny nose/post nasal drip/drainage.   - Use less frequently if airway gets too dry. - Start Singulair (Montelukast) 10mg  nightly.   - Discontinue if nightmares of behavior changes. - Continue Antihistamine: daily or daily as needed.  Allegra is less sedating  -Options include Zyrtec (Cetirizine) 10mg , Claritin (Loratadine) 10mg , Allegra (Fexofenadine) 180mg , or Xyzal (Levocetirinze) 5mg  - Can be purchased over-the-counter if not covered by insurance.  Allergic Conjunctivitis:  - Consider Allergy Eye drops-great options include Pataday (Olopatadine) or Zaditor (ketotifen) for eye symptoms daily as needed-both sold over the counter if not covered by insurance. and Rewetting Drops such as Systane,TheraTears, etc  -Avoid eye drops that say red eye relief as they may contain medications that dry out your eyes.   If no response to above plan, we will consider getting a CT SCAN of sinuses.  Given limited positive positive testing, we will double check with blood work and check immunoglobulins   Follow up: 4 weeks   Thank you so much for letting me partake in your care today.  Don't hesitate to reach out if you have any additional concerns!  Ferol Luz, MD  [facility] [place], [place]  Control of Mold Allergen   Mold and fungi can grow on a variety of surfaces provided certain temperature and moisture conditions exist.  Outdoor molds grow  on plants, decaying vegetation and soil.  The major outdoor mold, Alternaria and Cladosporium, are found in very high numbers during hot and dry conditions.  Generally, a late Summer - Fall peak is seen for common outdoor fungal spores.  Rain will temporarily lower outdoor mold spore count, but counts rise rapidly when the rainy period ends.  The most important indoor molds are Aspergillus and Penicillium.  Dark, humid and poorly ventilated basements are ideal sites for mold growth.  The next most common sites of mold growth are the bathroom and the kitchen.  Outdoor (Seasonal) Mold Control  Use air conditioning and keep windows closed Avoid exposure to decaying vegetation. Avoid leaf raking. Avoid grain handling. Consider wearing a face mask if working in moldy areas.    Indoor (Perennial) Mold Control     Maintain humidity below 50%. Clean washable surfaces with 5% bleach solution. Remove sources e.g. contaminated carpets.   Control of Dog or Cat Allergen  Avoidance is the best way to manage a dog or cat allergy. If you have a dog or cat and are allergic to dog or cats, consider removing the dog or cat from the home. If you have a dog or cat but don't want to find it a new home, or if your family wants a pet even though someone in the household is allergic, here are some strategies that may help keep symptoms at bay:  Keep the pet out of your bedroom and restrict it to only a few rooms. Be advised that keeping the dog or cat in only one room will not limit  the allergens to that room. Don't pet, hug or kiss the dog or cat; if you do, wash your hands with soap and water. High-efficiency particulate air (HEPA) cleaners run continuously in a bedroom or living room can reduce allergen levels over time. Regular use of a high-efficiency vacuum cleaner or a central vacuum can reduce allergen levels. Giving your dog or cat a bath at least once a week can reduce airborne allergen.  Reducing  Pollen Exposure  The American Academy of Allergy, Asthma and Immunology suggests the following steps to reduce your exposure to pollen during allergy seasons.    Do not hang sheets or clothing out to dry; pollen may collect on these items. Do not mow lawns or spend time around freshly cut grass; mowing stirs up pollen. Keep windows closed at night.  Keep car windows closed while driving. Minimize morning activities outdoors, a time when pollen counts are usually at their highest. Stay indoors as much as possible when pollen counts or humidity is high and on windy days when pollen tends to remain in the air longer. Use air conditioning when possible.  Many air conditioners have filters that trap the pollen spores. Use a HEPA room air filter to remove pollen form the indoor air you breathe.
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
## 2022-11-21 LAB — ALLERGENS W/TOTAL IGE AREA 2

## 2022-11-22 ENCOUNTER — Ambulatory Visit (HOSPITAL_COMMUNITY): Payer: 59 | Admitting: Licensed Clinical Social Worker

## 2022-11-25 LAB — ALLERGENS W/TOTAL IGE AREA 2
Alternaria Alternata IgE: <0.1 kU/L
Aspergillus Fumigatus IgE: <0.1 kU/L
Bermuda Grass IgE: <0.1 kU/L
Cedar, Mountain IgE: <0.1 kU/L
Cladosporium Herbarum IgE: <0.1 kU/L
Common Silver Birch IgE: <0.1 kU/L
D Farinae IgE: <0.1 kU/L
D Pteronyssinus IgE: <0.1 kU/L
Dog Dander IgE: <0.1 kU/L
Elm, American IgE: <0.1 kU/L
Johnson Grass IgE: <0.1 kU/L
Maple/Box Elder IgE: <0.1 kU/L
Mouse Urine IgE: <0.1 kU/L
Pecan, Hickory IgE: <0.1 kU/L
Penicillium Chrysogen IgE: <0.1 kU/L
Ragweed, Short IgE: 0.88 kU/L — AB
White Mulberry IgE: <0.1 kU/L

## 2022-11-25 LAB — IMMUNOGLOBULINS A/E/G/M, SERUM
IgA/Immunoglobulin A, Serum: 390 mg/dL — ABNORMAL HIGH (ref 87–352)
IgE (Immunoglobulin E), Serum: 156 IU/mL (ref 6–495)
IgG (Immunoglobin G), Serum: 2085 mg/dL — ABNORMAL HIGH (ref 586–1602)
IgM (Immunoglobulin M), Srm: 29 mg/dL (ref 26–217)

## 2022-11-26 ENCOUNTER — Telehealth (INDEPENDENT_AMBULATORY_CARE_PROVIDER_SITE_OTHER): Payer: 59 | Admitting: Psychiatry

## 2022-11-26 ENCOUNTER — Encounter (HOSPITAL_COMMUNITY): Payer: Self-pay | Admitting: Psychiatry

## 2022-11-26 DIAGNOSIS — G629 Polyneuropathy, unspecified: Secondary | ICD-10-CM

## 2022-11-26 DIAGNOSIS — F332 Major depressive disorder, recurrent severe without psychotic features: Secondary | ICD-10-CM | POA: Diagnosis not present

## 2022-11-26 MED ORDER — QUETIAPINE FUMARATE 100 MG PO TABS: 100.0000 mg | ORAL_TABLET | Freq: Every day | ORAL | 1 refills | Status: DC

## 2022-11-26 MED ORDER — PREGABALIN 25 MG PO CAPS: ORAL_CAPSULE | ORAL | 0 refills | Status: DC

## 2022-11-26 MED ORDER — DULOXETINE HCL 60 MG PO CPEP: 60.0000 mg | ORAL_CAPSULE | Freq: Every day | ORAL | 0 refills | Status: DC
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
## 2022-11-27 ENCOUNTER — Ambulatory Visit: Payer: 59 | Admitting: Internal Medicine

## 2022-12-04 ENCOUNTER — Telehealth (HOSPITAL_COMMUNITY): Payer: Self-pay

## 2022-12-04 ENCOUNTER — Ambulatory Visit (INDEPENDENT_AMBULATORY_CARE_PROVIDER_SITE_OTHER): Payer: 59 | Admitting: Licensed Clinical Social Worker

## 2022-12-04 DIAGNOSIS — F4310 Post-traumatic stress disorder, unspecified: Secondary | ICD-10-CM | POA: Diagnosis not present

## 2022-12-04 DIAGNOSIS — G629 Polyneuropathy, unspecified: Secondary | ICD-10-CM | POA: Diagnosis not present

## 2022-12-04 DIAGNOSIS — F411 Generalized anxiety disorder: Secondary | ICD-10-CM | POA: Diagnosis not present

## 2022-12-04 DIAGNOSIS — F3163 Bipolar disorder, current episode mixed, severe, without psychotic features: Secondary | ICD-10-CM | POA: Diagnosis not present

## 2022-12-04 DIAGNOSIS — F332 Major depressive disorder, recurrent severe without psychotic features: Secondary | ICD-10-CM

## 2022-12-04 MED ORDER — QUETIAPINE FUMARATE 100 MG PO TABS
100.0000 mg | ORAL_TABLET | Freq: Every day | ORAL | 2 refills | Status: DC
Start: 2022-12-04 — End: 2023-02-27

## 2022-12-04 MED ORDER — PREGABALIN 25 MG PO CAPS: ORAL_CAPSULE | ORAL | 2 refills | Status: DC

## 2022-12-04 MED ORDER — DULOXETINE HCL 60 MG PO CPEP: 60.0000 mg | ORAL_CAPSULE | Freq: Every day | ORAL | 0 refills | Status: DC

## 2022-12-04 NOTE — Progress Notes (Signed)
Blood work showed elevated Immunoglobulins A and G.  This is not concerning for an immunodeficiency.  However there are some more labs we need to check.  If she walks into the [facility] office we can get them done before her follow up

## 2022-12-04 NOTE — Addendum Note (Signed)
Addended by: Ralph Leyden on: 12/04/2022 02:47 PM   Modules accepted: Orders

## 2022-12-04 NOTE — Telephone Encounter (Signed)
Medication refills - Fax from patient's [facility] for new orders for Pregabalin, Duloxetine, and Quetiapine, all last ordred for 1 time when seen on 11/26/22 and pt does not return until 02/27/23.
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
## 2022-12-06 ENCOUNTER — Other Ambulatory Visit: Payer: Self-pay

## 2022-12-06 ENCOUNTER — Telehealth: Payer: Self-pay

## 2022-12-06 ENCOUNTER — Other Ambulatory Visit (HOSPITAL_COMMUNITY)
Admission: RE | Admit: 2022-12-06 | Discharge: 2022-12-06 | Disposition: A | Discharge status: Home / Self Care | Payer: 59 | Source: Ambulatory Visit | Attending: Internal Medicine | Admitting: Internal Medicine

## 2022-12-06 ENCOUNTER — Encounter: Payer: Self-pay | Admitting: Internal Medicine

## 2022-12-06 ENCOUNTER — Ambulatory Visit (INDEPENDENT_AMBULATORY_CARE_PROVIDER_SITE_OTHER): Payer: 59 | Admitting: Internal Medicine

## 2022-12-06 VITALS — BP 151/102 | HR 93 | Temp 97.6°F | Resp 16 | Wt 223.2 lb

## 2022-12-06 DIAGNOSIS — Z113 Encounter for screening for infections with a predominantly sexual mode of transmission: Secondary | ICD-10-CM | POA: Insufficient documentation

## 2022-12-06 DIAGNOSIS — K7460 Unspecified cirrhosis of liver: Secondary | ICD-10-CM | POA: Diagnosis not present

## 2022-12-06 DIAGNOSIS — E1165 Type 2 diabetes mellitus with hyperglycemia: Secondary | ICD-10-CM

## 2022-12-06 DIAGNOSIS — Z21 Asymptomatic human immunodeficiency virus [HIV] infection status: Secondary | ICD-10-CM | POA: Insufficient documentation

## 2022-12-06 DIAGNOSIS — J40 Bronchitis, not specified as acute or chronic: Secondary | ICD-10-CM

## 2022-12-06 LAB — CBC WITH DIFFERENTIAL/PLATELET
Absolute Monocytes: 462 cells/uL (ref 200–950)
Basophils Relative: 0.4 %
Eosinophils Absolute: 292 cells/uL (ref 15–500)
HCT: 32.8 % — ABNORMAL LOW (ref 35.0–45.0)
MPV: 12.9 fL — ABNORMAL HIGH (ref 7.5–12.5)
Monocytes Relative: 6.8 %
Neutro Abs: 3604 cells/uL (ref 1500–7800)
Platelets: 139 10*3/uL — ABNORMAL LOW (ref 140–400)
WBC: 6.8 10*3/uL (ref 3.8–10.8)

## 2022-12-06 MED ORDER — BICTEGRAVIR-EMTRICITAB-TENOFOV 50-200-25 MG PO TABS: 1.0000 | ORAL_TABLET | Freq: Every day | ORAL | 11 refills | Status: DC

## 2022-12-06 MED ORDER — METFORMIN HCL 500 MG PO TABS
500.0000 mg | ORAL_TABLET | Freq: Two times a day (BID) | ORAL | 0 refills | Status: DC
Start: 2022-12-06 — End: 2023-03-06

## 2022-12-06 NOTE — Assessment & Plan Note (Signed)
I reviewed all of medications and there is an interaction with the cobicistat.  Therefore I discussed options and will change her to [place].  She did have this listed as an allergy stemming from a rash in 2019 but still has the same rash issues and is not c/w a medication effect.  Therefore, will restart.    I have personally spent 43 minutes involved in face-to-face and non-face-to-face activities for this patient on the day of the visit. Professional time spent includes the following activities: Preparing to see the patient (review of tests), Obtaining and/or reviewing separately obtained history (admission/discharge record), Performing a medically appropriate examination and/or evaluation , Ordering medications/tests/procedures, referring and communicating with other health care professionals, Documenting clinical information in the EMR, Independently interpreting results (not separately reported), Communicating results to the patient/family/caregiver, Counseling and educating the patient/family/caregiver and Care coordination (not separately reported).

## 2022-12-06 NOTE — Telephone Encounter (Signed)
Please advise [facility] 

## 2022-12-06 NOTE — Assessment & Plan Note (Signed)
Will screen 

## 2022-12-06 NOTE — Assessment & Plan Note (Signed)
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  I will discuss repeating this with her at her next visit.

## 2022-12-06 NOTE — Progress Notes (Signed)
   Subjective:    Patient ID: Cheryl Thompson, female    DOB: Nov 19, 1965, 56 y.o.   MRN: [id number]  HPI Cheryl Thompson is here for follow up of HIV She continues on Symtuza and has had no concerns.  No issues getting or taking it.  She is seeing an allergist due to seasonal-type allergies and undergoing evaluation.  She has had issues with rash and nasal congestion with swelling under eyes and now on new medications including fluticasone.  She is otherwise without concerns.     Review of Systems  Constitutional:  Negative for fatigue.  Gastrointestinal:  Negative for diarrhea.  Skin:  Positive for rash.       Objective:   Physical Exam Eyes:     General: No scleral icterus. Pulmonary:     Effort: Pulmonary effort is normal.  Neurological:     Mental Status: She is alert.   SH: + tobacco        Assessment & Plan:

## 2022-12-07 ENCOUNTER — Other Ambulatory Visit: Payer: Self-pay

## 2022-12-07 DIAGNOSIS — E039 Hypothyroidism, unspecified: Secondary | ICD-10-CM

## 2022-12-07 DIAGNOSIS — I10 Essential (primary) hypertension: Secondary | ICD-10-CM

## 2022-12-07 LAB — URINE CYTOLOGY ANCILLARY ONLY
Chlamydia: NEGATIVE
Comment: NEGATIVE
Comment: NORMAL
Neisseria Gonorrhea: NEGATIVE

## 2022-12-07 LAB — COMPLETE METABOLIC PANEL WITH GFR
AST: 29 U/L (ref 10–35)
Albumin: 3.9 g/dL (ref 3.6–5.1)
BUN: 9 mg/dL (ref 7–25)
CO2: 25 mmol/L (ref 20–32)

## 2022-12-07 LAB — T-HELPER CELL (CD4) - (RCID CLINIC ONLY)
CD4 % Helper T Cell: 46 % (ref 33–65)
CD4 T Cell Abs: 1070 /uL (ref 400–1790)

## 2022-12-07 MED ORDER — HYDROCHLOROTHIAZIDE 25 MG PO TABS
ORAL_TABLET | ORAL | 2 refills | Status: DC
Start: 2022-12-07 — End: 2023-04-29

## 2022-12-07 MED ORDER — LEVOTHYROXINE SODIUM 100 MCG PO TABS
100.0000 ug | ORAL_TABLET | Freq: Every day | ORAL | 0 refills | Status: DC
Start: 2022-12-07 — End: 2023-02-07

## 2022-12-07 MED ORDER — ALBUTEROL SULFATE HFA 108 (90 BASE) MCG/ACT IN AERS
2.0000 | INHALATION_SPRAY | Freq: Four times a day (QID) | RESPIRATORY_TRACT | 3 refills | Status: AC | PRN
Start: 2022-12-07 — End: ?

## 2022-12-07 NOTE — Telephone Encounter (Signed)
Medication refilled

## 2022-12-08 LAB — COMPLETE METABOLIC PANEL WITH GFR
AG Ratio: 1.1 (calc) (ref 1.0–2.5)
ALT: 16 U/L (ref 6–29)
Alkaline phosphatase (APISO): 70 U/L (ref 37–153)
Calcium: 8.7 mg/dL (ref 8.6–10.4)
Chloride: 107 mmol/L (ref 98–110)
Creat: 0.84 mg/dL (ref 0.50–1.03)
Globulin: 3.6 g/dL (calc) (ref 1.9–3.7)
Glucose, Bld: 118 mg/dL — ABNORMAL HIGH (ref 65–99)
Potassium: 3.5 mmol/L (ref 3.5–5.3)
Sodium: 139 mmol/L (ref 135–146)
Total Bilirubin: 0.3 mg/dL (ref 0.2–1.2)
Total Protein: 7.5 g/dL (ref 6.1–8.1)
eGFR: 81 mL/min/{1.73_m2} (ref >=60)

## 2022-12-08 LAB — CBC WITH DIFFERENTIAL/PLATELET
Basophils Absolute: 27 cells/uL (ref 0–200)
Eosinophils Relative: 4.3 %
Hemoglobin: 10.7 g/dL — ABNORMAL LOW (ref 11.7–15.5)
Lymphs Abs: 2414 cells/uL (ref 850–3900)
MCH: 29.2 pg (ref 27.0–33.0)
MCHC: 32.6 g/dL (ref 32.0–36.0)
MCV: 89.6 fL (ref 80.0–100.0)
Neutrophils Relative %: 53 %
RBC: 3.66 10*6/uL — ABNORMAL LOW (ref 3.80–5.10)
RDW: 13.4 % (ref 11.0–15.0)
Total Lymphocyte: 35.5 %

## 2022-12-08 LAB — RPR: RPR Ser Ql: NONREACTIVE

## 2022-12-08 LAB — HIV-1 RNA QUANT-NO REFLEX-BLD
HIV 1 RNA Quant: NOT DETECTED Copies/mL
HIV-1 RNA Quant, Log: NOT DETECTED Log cps/mL

## 2022-12-12 ENCOUNTER — Ambulatory Visit (HOSPITAL_COMMUNITY): Payer: 59 | Admitting: Licensed Clinical Social Worker

## 2022-12-18 ENCOUNTER — Encounter: Payer: Self-pay | Admitting: Internal Medicine

## 2022-12-18 ENCOUNTER — Ambulatory Visit (INDEPENDENT_AMBULATORY_CARE_PROVIDER_SITE_OTHER): Payer: 59 | Admitting: Internal Medicine

## 2022-12-18 ENCOUNTER — Other Ambulatory Visit: Payer: Self-pay

## 2022-12-18 ENCOUNTER — Telehealth: Payer: Self-pay

## 2022-12-18 VITALS — BP 150/88 | HR 74 | Temp 97.6°F | Resp 18 | Wt 220.2 lb

## 2022-12-18 DIAGNOSIS — H1045 Other chronic allergic conjunctivitis: Secondary | ICD-10-CM | POA: Diagnosis not present

## 2022-12-18 DIAGNOSIS — D892 Hypergammaglobulinemia, unspecified: Secondary | ICD-10-CM | POA: Diagnosis not present

## 2022-12-18 DIAGNOSIS — J329 Chronic sinusitis, unspecified: Secondary | ICD-10-CM

## 2022-12-18 DIAGNOSIS — R030 Elevated blood-pressure reading, without diagnosis of hypertension: Secondary | ICD-10-CM

## 2022-12-18 DIAGNOSIS — J3089 Other allergic rhinitis: Secondary | ICD-10-CM | POA: Diagnosis not present

## 2022-12-18 DIAGNOSIS — Z21 Asymptomatic human immunodeficiency virus [HIV] infection status: Secondary | ICD-10-CM

## 2022-12-18 NOTE — Progress Notes (Unsigned)
Follow Up Note  RE: Cheryl Thompson MRN: [id number] DOB: Jul 31, 1965 Date of Office Visit: 12/18/2022  Referring provider: Donell Beers, FNP Primary care provider: Donell Beers, FNP  Chief Complaint: Follow-up (Left arm skin itchy)  History of Present Illness: I had the pleasure of seeing Cheryl Thompson for a follow up visit at the [facility] on 12/18/2022. She is a 57 y.o. female with well controlled HIV, who is being followed for recurrent sinus infections, allergic rhinoconjunctivitis. Her previous allergy office visit was on 11/20/22 with Dr. Marlynn Perking. Today is a regular follow up visit.  History obtained from patient, chart review.   Today she reports:  She saw infectious disease doctor and was switched to [place].  HIV labs showed undetectable HIV RNA at that time.  CD4 count greater than 1000.  Infectious disease did not think elevated IgG and IgA could be due to underlying HIV viremia.  It was noted that she had fibrosis but no cirrhosis on previous elastography and plan to discuss at next visit.  She also had persistently elevated blood pressures.  She does report some improvement in nasal congestion, ocular itchign and ear fullness with dymista, atrovent, singulair and cetirizine.  Still with sinus tenderness and pain.   She has had increased fatigue and numbness in right leg and left arms associated with pain.  She is established with orthopedics and has an upcoming appointment with neurology.    Denies any OCS or ABX since last visit     Pertinent History/Diagnostics:  - Allergic Rhinitis: year round, flares in spring  - SPT environmental panel (11/19/21): ragweed, MM3, cat   - sIgE (11/19/21): ragweed   -Recurrent sinus infection    -HIV positive, recently switched to Bikarvy, HIV RNA undetectable 12/05/21     11/19/21: IgG 2,085, IgA: 390. IgM 29, IgE 156    Assessment and Plan: Cheryl Thompson is a 57 y.o. female with: Recurrent sinus infections -  Plan: CT MAXILLOFACIAL WO CONTRAST  Other allergic rhinitis  Other chronic allergic conjunctivitis of both eyes  Hypergammaglobulinemia  HIV infection, asymptomatic (HCC)  Elevated blood pressure reading   Plan: Patient Instructions  Chronic Rhinitis Seasonal and Perennial Allergic with nonallergic component  - Continue avoidance measures to ragweed, mold, cat  - Prevention:  - allergen avoidance when possible - consider allergy shots as long term control of your symptoms by teaching your immune system to be more tolerant of your allergy triggers  - Symptom control: - Continue  Dymista  1-2 sprays in each nostril twice a day as needed for nasal congestion/itchy nose - Continue Atrovent (Ipratropium Bromide) 1-2 sprays in each nostril up to 3 times a day as needed for runny nose/post nasal drip/drainage.   - Use less frequently if airway gets too dry. - Continue Singulair (Montelukast) 10mg  nightly.   - Discontinue if nightmares of behavior changes. - Continue Antihistamine: daily or daily as needed.  Allegra is less sedating  -Options include Zyrtec (Cetirizine) 10mg , Claritin (Loratadine) 10mg , Allegra (Fexofenadine) 180mg , or Xyzal (Levocetirinze) 5mg  - Can be purchased over-the-counter if not covered by insurance.  Allergic Conjunctivitis:  - Consider Allergy Eye drops-great options include Pataday (Olopatadine) or Zaditor (ketotifen) for eye symptoms daily as needed-both sold over the counter if not covered by insurance. and Rewetting Drops such as Systane,TheraTears, etc  -Avoid eye drops that say red eye relief as they may contain medications that dry out your eyes.   Will get CT sinus given  persistent symptoms  Repeat Immunoglobulins, SPEP and UPEP given elevated IgG and IgA  Elevated Blood Pressure:  Follow up with PCP   Arm Numbness: Follow up with neurology as planned.   Follow up: We will call you after CT and lab results for next steps, otherwise follow-up in 3  months  Thank you so much for letting me partake in your care today.  Don't hesitate to reach out if you have any additional concerns!  Ferol Luz, MD  [facility] [place] [place]    No orders of the defined types were placed in this encounter.   Lab Orders  No laboratory test(s) ordered today   Diagnostics: None done   Medication List:  Current Outpatient Medications  Medication Sig Dispense Refill   albuterol (VENTOLIN HFA) 108 (90 Base) MCG/ACT inhaler Inhale 2 puffs into the lungs every 6 (six) hours as needed for wheezing or shortness of breath. 18 g 3   Azelastine-Fluticasone 137-50 MCG/ACT SUSP Place 1 spray into the nose in the morning and at bedtime. 23 g 5   benzonatate (TESSALON PERLES) 100 MG capsule Take 1 capsule (100 mg total) by mouth 3 (three) times daily as needed for cough. 30 capsule 0   bictegravir-emtricitabine-tenofovir AF (BIKTARVY) 50-200-25 MG TABS tablet Take 1 tablet by mouth daily. 30 tablet 11   cetirizine (ZYRTEC) 10 MG tablet Take 1 tablet (10 mg total) by mouth daily. 30 tablet 11   cyclobenzaprine (FLEXERIL) 10 MG tablet TAKE 1/2 (ONE-HALF) TABLET BY MOUTH AT BEDTIME CAN  INCREASE  GRADUALLY  TO  1  TABLET  THREE  TIMES  DAILY 45 tablet 0   DULoxetine (CYMBALTA) 60 MG capsule Take 1 capsule (60 mg total) by mouth daily. 90 capsule 0   ezetimibe (ZETIA) 10 MG tablet Take 1 tablet (10 mg total) by mouth daily. 90 tablet 3   fluticasone (FLONASE) 50 MCG/ACT nasal spray Place 2 sprays into both nostrils daily. 16 g 6   hydrochlorothiazide (HYDRODIURIL) 25 MG tablet TAKE 1 TABLET(25 MG) BY MOUTH DAILY FOR HIGH BLOOD PRESSURE 90 tablet 2   hydrOXYzine (ATARAX) 25 MG tablet Take 1 tablet (25 mg total) by mouth daily as needed for anxiety. 30 tablet 1   hyoscyamine (LEVSIN SL) 0.125 MG SL tablet Place 1 tablet (0.125 mg total) under the tongue every 4 (four) hours as needed. 90 tablet 1   ibuprofen (ADVIL) 800 MG tablet Take 1 tablet (800  mg total) by mouth every 8 (eight) hours as needed. 30 tablet 0   icosapent Ethyl (VASCEPA) 1 g capsule Take 2 capsules (2 g total) by mouth 2 (two) times daily. 180 capsule 1   ipratropium (ATROVENT) 0.06 % nasal spray Place 2 sprays into both nostrils 4 (four) times daily. 15 mL 12   levETIRAcetam (KEPPRA) 500 MG tablet TAKE 1 TABLET(500 MG) BY MOUTH TWICE DAILY 180 tablet 2   levocetirizine (XYZAL) 5 MG tablet Take 5 mg by mouth daily.     levothyroxine (SYNTHROID) 100 MCG tablet Take 1 tablet (100 mcg total) by mouth daily before breakfast. 90 tablet 0   metFORMIN (GLUCOPHAGE) 500 MG tablet Take 1 tablet (500 mg total) by mouth 2 (two) times daily with a meal. 180 tablet 0   montelukast (SINGULAIR) 10 MG tablet Take 1 tablet (10 mg total) by mouth at bedtime. 30 tablet 5   oxyCODONE (OXY IR/ROXICODONE) 5 MG immediate release tablet Take 1-2 tablets (5-10 mg total) by mouth every 6 (six)  hours as needed for severe pain. Not to exceed 6 tablets a day. 42 tablet 0   pregabalin (LYRICA) 25 MG capsule Take 1 capsule by mouth twice daily 60 capsule 2   QUEtiapine (SEROQUEL) 100 MG tablet Take 1 tablet (100 mg total) by mouth at bedtime. 30 tablet 2   traMADol (ULTRAM) 50 MG tablet Take 1 tablet (50 mg total) by mouth every 6 (six) hours. 40 tablet 0   triazolam (HALCION) 0.25 MG tablet Take by mouth.     No current facility-administered medications for this visit.   Allergies: Allergies  Allergen Reactions   Hydrocodone Other (See Comments)    confusion, dizziness   Penicillins Anaphylaxis   Naproxen Hives, Itching and Rash    Orange tablet=itching   Codeine Other (See Comments)    confusion, dizziness    Doxycycline Hives    blisters   Morphine And Codeine     Recovering narcotic user-prefers no narcs   Statins Nausea And Vomiting   I reviewed her past medical history, social history, family history, and environmental history and no significant changes have been reported from her  previous visit.  ROS: All others negative except as noted per HPI.   Objective: BP (!) 150/88   Pulse 74   Temp 97.6 F (36.4 C) (Temporal)   Resp 18   Wt 220 lb 3.2 oz (99.9 kg)   SpO2 100%   BMI 32.99 kg/m  Body mass index is 32.99 kg/m. General Appearance:  Alert, cooperative, no distress, appears stated age  Head:  Normocephalic, without obvious abnormality, atraumatic  Eyes:  Conjunctiva clear, EOM's intact  Nose: Nares normal,  edematous nasal mucosa, no visible anterior polyps, and septum midline  Throat: Lips, tongue normal; teeth and gums normal, normal posterior oropharynx  Neck: Supple, symmetrical  Lungs:   clear to auscultation bilaterally, Respirations unlabored, no coughing  Heart:  regular rate and rhythm and no murmur, Appears well perfused  Extremities: No edema  Skin: Skin color, texture, turgor normal, no rashes or lesions on visualized portions of skin  Neurologic: No gross deficits   Previous notes and tests were reviewed. The plan was reviewed with the patient/family, and all questions/concerned were addressed.  It was my pleasure to see Cheryl Thompson today and participate in her care. Please feel free to contact me with any questions or concerns.  Sincerely,  Ferol Luz, MD  Allergy & Immunology  [facility] Office: [phone]

## 2022-12-18 NOTE — Telephone Encounter (Signed)
Called [facility] [employer] no pre cert needed for ct of sinuses ref # [id number]
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
## 2022-12-18 NOTE — Patient Instructions (Addendum)
Chronic Rhinitis Seasonal and Perennial Allergic with nonallergic component  - Continue avoidance measures to ragweed, mold, cat  - Prevention:  - allergen avoidance when possible - consider allergy shots as long term control of your symptoms by teaching your immune system to be more tolerant of your allergy triggers  - Symptom control: - Continue  Dymista  1-2 sprays in each nostril twice a day as needed for nasal congestion/itchy nose - Continue Atrovent (Ipratropium Bromide) 1-2 sprays in each nostril up to 3 times a day as needed for runny nose/post nasal drip/drainage.   - Use less frequently if airway gets too dry. - Continue Singulair (Montelukast) 10mg  nightly.   - Discontinue if nightmares of behavior changes. - Continue Antihistamine: daily or daily as needed.  Allegra is less sedating  -Options include Zyrtec (Cetirizine) 10mg , Claritin (Loratadine) 10mg , Allegra (Fexofenadine) 180mg , or Xyzal (Levocetirinze) 5mg  - Can be purchased over-the-counter if not covered by insurance.  Allergic Conjunctivitis:  - Consider Allergy Eye drops-great options include Pataday (Olopatadine) or Zaditor (ketotifen) for eye symptoms daily as needed-both sold over the counter if not covered by insurance. and Rewetting Drops such as Systane,TheraTears, etc  -Avoid eye drops that say red eye relief as they may contain medications that dry out your eyes.   Will get CT sinus given persistent symptoms  Repeat Immunoglobulins, SPEP and UPEP given elevated IgG and IgA  Elevated Blood Pressure:  Follow up with PCP   Arm Numbness: Follow up with neurology as planned.   Follow up: We will call you after CT and lab results for next steps, otherwise follow-up in 3 months  Thank you so much for letting me partake in your care today.  Don't hesitate to reach out if you have any additional concerns!  Ferol Luz, MD  [facility] [place] [place]

## 2022-12-19 ENCOUNTER — Encounter: Payer: Self-pay | Admitting: Neurology

## 2022-12-19 ENCOUNTER — Ambulatory Visit: Payer: 59 | Admitting: Neurology

## 2022-12-19 LAB — PROTEIN ELECTROPHORESIS, SERUM

## 2022-12-19 LAB — PROTEIN ELECTROPHORESIS, URINE REFLEX

## 2022-12-19 LAB — IMMUNOGLOBULINS A/E/G/M, SERUM: IgM (Immunoglobulin M), Srm: 32 mg/dL (ref 26–217)

## 2022-12-21 ENCOUNTER — Telehealth: Payer: Self-pay | Admitting: Internal Medicine

## 2022-12-21 LAB — PROTEIN ELECTROPHORESIS, URINE REFLEX
Albumin ELP, Urine: 100 %
Beta Globulin, U: 0 %

## 2022-12-21 LAB — PROTEIN ELECTROPHORESIS, SERUM

## 2022-12-21 NOTE — Telephone Encounter (Signed)
Patient request a call back about lab results

## 2022-12-21 NOTE — Telephone Encounter (Signed)
Patient request a call back about lab results 

## 2022-12-24 NOTE — Progress Notes (Signed)
SPEP is still pending, but the urine studies were normal.  Immunoglobulins G and A were still elevated.  Blood work was negative for environmental allergies.    We definitely need to get the Sinus CT as this may give [place] more information.

## 2022-12-25 LAB — PROTEIN ELECTROPHORESIS, SERUM
Albumin ELP: 3.4 g/dL (ref 2.9–4.4)
Alpha 1: 0.2 g/dL (ref 0.0–0.4)
Beta: 1.2 g/dL (ref 0.7–1.3)
Gamma Globulin: 2.1 g/dL — ABNORMAL HIGH (ref 0.4–1.8)
Total Protein: 7.6 g/dL (ref 6.0–8.5)

## 2022-12-25 LAB — PROTEIN ELECTROPHORESIS, URINE REFLEX
Alpha-1-Globulin, U: 0 %
Alpha-2-Globulin, U: 0 %
Protein, Ur: <4 mg/dL

## 2022-12-25 LAB — IMMUNOGLOBULINS A/E/G/M, SERUM
IgA/Immunoglobulin A, Serum: 361 mg/dL — ABNORMAL HIGH (ref 87–352)
IgG (Immunoglobin G), Serum: 2151 mg/dL — ABNORMAL HIGH (ref 586–1602)
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
## 2022-12-26 ENCOUNTER — Encounter: Payer: Self-pay | Admitting: Neurology

## 2022-12-26 ENCOUNTER — Ambulatory Visit: Payer: 59 | Admitting: Neurology

## 2022-12-26 NOTE — Progress Notes (Signed)
SPEP returned and is reassuring.  Most likely cause is underlying infection. Next step is Sinus CT.  Can someone let patient know?

## 2022-12-30 ENCOUNTER — Emergency Department (HOSPITAL_COMMUNITY)
Admission: EM | Admit: 2022-12-30 | Discharge: 2022-12-30 | Disposition: A | Discharge status: Home / Self Care | Payer: 59 | Attending: Emergency Medicine | Admitting: Emergency Medicine

## 2022-12-30 ENCOUNTER — Emergency Department (HOSPITAL_COMMUNITY): Payer: 59

## 2022-12-30 ENCOUNTER — Other Ambulatory Visit: Payer: Self-pay

## 2022-12-30 DIAGNOSIS — Z21 Asymptomatic human immunodeficiency virus [HIV] infection status: Secondary | ICD-10-CM | POA: Diagnosis not present

## 2022-12-30 DIAGNOSIS — R2241 Localized swelling, mass and lump, right lower limb: Secondary | ICD-10-CM | POA: Diagnosis not present

## 2022-12-30 DIAGNOSIS — Z7984 Long term (current) use of oral hypoglycemic drugs: Secondary | ICD-10-CM | POA: Insufficient documentation

## 2022-12-30 DIAGNOSIS — M7989 Other specified soft tissue disorders: Secondary | ICD-10-CM | POA: Diagnosis not present

## 2022-12-30 DIAGNOSIS — M25461 Effusion, right knee: Secondary | ICD-10-CM | POA: Diagnosis not present

## 2022-12-30 DIAGNOSIS — M25469 Effusion, unspecified knee: Secondary | ICD-10-CM

## 2022-12-30 NOTE — ED Triage Notes (Signed)
Pt arrived via POV. C/o R knee swelling. Area of swelling approx 4cm over patella- tender to touch. Pt had R TKR 8/23

## 2022-12-30 NOTE — ED Provider Notes (Signed)
[place] EMERGENCY DEPARTMENT AT [facility] Provider Note   CSN: [id number] Arrival date & time: 12/30/22  1542     History  Chief Complaint  Patient presents with   Joint Swelling    Cheryl Thompson is a 57 y.o. female with past medical history significant for HIV, osteoarthritis of knee, status post knee replacement 8/23 with [facility] who presents with concern for right knee swelling for the last 2 3 days.  She reports that it is tender to touch, she reports some spasm of the muscles.  She has not taken anything for pain.  She reports that her family was worried and wanted her to get checked out.  No previous history of blood clots.  She denies any recent immobilization, history of cancer.  She denies any shortness of breath, chest pain.  She denies any known injury of the knee.  HPI     Home Medications Prior to Admission medications   Medication Sig Start Date End Date Taking? Authorizing Provider  albuterol (VENTOLIN HFA) 108 (90 Base) MCG/ACT inhaler Inhale 2 puffs into the lungs every 6 (six) hours as needed for wheezing or shortness of breath. 12/07/22   Paseda, Baird Kay, FNP  Azelastine-Fluticasone 137-50 MCG/ACT SUSP Place 1 spray into the nose in the morning and at bedtime. 11/20/22   Ferol Luz, MD  benzonatate (TESSALON PERLES) 100 MG capsule Take 1 capsule (100 mg total) by mouth 3 (three) times daily as needed for cough. 09/10/22 09/10/23  Donell Beers, FNP  bictegravir-emtricitabine-tenofovir AF (BIKTARVY) 50-200-25 MG TABS tablet Take 1 tablet by mouth daily. 12/06/22   Gardiner Barefoot, MD  cetirizine (ZYRTEC) 10 MG tablet Take 1 tablet (10 mg total) by mouth daily. 09/10/22   Paseda, Baird Kay, FNP  cyclobenzaprine (FLEXERIL) 10 MG tablet TAKE 1/2 (ONE-HALF) TABLET BY MOUTH AT BEDTIME CAN  INCREASE  GRADUALLY  TO  1  TABLET  THREE  TIMES  DAILY 09/10/22   Paseda, Baird Kay, FNP  DULoxetine (CYMBALTA) 60 MG capsule Take 1 capsule (60 mg  total) by mouth daily. 12/04/22   Thresa Ross, MD  ezetimibe (ZETIA) 10 MG tablet Take 1 tablet (10 mg total) by mouth daily. 09/11/22   Paseda, Baird Kay, FNP  fluticasone (FLONASE) 50 MCG/ACT nasal spray Place 2 sprays into both nostrils daily. 09/10/22   Paseda, Baird Kay, FNP  hydrochlorothiazide (HYDRODIURIL) 25 MG tablet TAKE 1 TABLET(25 MG) BY MOUTH DAILY FOR HIGH BLOOD PRESSURE 12/07/22   Paseda, Baird Kay, FNP  hydrOXYzine (ATARAX) 25 MG tablet Take 1 tablet (25 mg total) by mouth daily as needed for anxiety. 07/02/22   Paseda, Baird Kay, FNP  hyoscyamine (LEVSIN SL) 0.125 MG SL tablet Place 1 tablet (0.125 mg total) under the tongue every 4 (four) hours as needed. 10/16/21   Massie Maroon, FNP  ibuprofen (ADVIL) 800 MG tablet Take 1 tablet (800 mg total) by mouth every 8 (eight) hours as needed. 06/22/22   Massie Maroon, FNP  icosapent Ethyl (VASCEPA) 1 g capsule Take 2 capsules (2 g total) by mouth 2 (two) times daily. 11/01/22   Paseda, Baird Kay, FNP  ipratropium (ATROVENT) 0.06 % nasal spray Place 2 sprays into both nostrils 4 (four) times daily. 11/20/22   Ferol Luz, MD  levETIRAcetam (KEPPRA) 500 MG tablet TAKE 1 TABLET(500 MG) BY MOUTH TWICE DAILY 09/07/20   Massie Maroon, FNP  levocetirizine (XYZAL) 5 MG tablet Take 5 mg by mouth daily. 09/26/22  [provider]  levothyroxine (SYNTHROID) 100 MCG tablet Take 1 tablet (100 mcg total) by mouth daily before breakfast. 12/07/22   Paseda, Baird Kay, FNP  metFORMIN (GLUCOPHAGE) 500 MG tablet Take 1 tablet (500 mg total) by mouth 2 (two) times daily with a meal. 12/06/22   Paseda, Baird Kay, FNP  montelukast (SINGULAIR) 10 MG tablet Take 1 tablet (10 mg total) by mouth at bedtime. 11/20/22   Ferol Luz, MD  oxyCODONE (OXY IR/ROXICODONE) 5 MG immediate release tablet Take 1-2 tablets (5-10 mg total) by mouth every 6 (six) hours as needed for severe pain. Not to exceed 6 tablets a day. 02/27/22   Edmisten,  Lyn Hollingshead, PA  pregabalin (LYRICA) 25 MG capsule Take 1 capsule by mouth twice daily 12/04/22   Thresa Ross, MD  QUEtiapine (SEROQUEL) 100 MG tablet Take 1 tablet (100 mg total) by mouth at bedtime. 12/04/22   Thresa Ross, MD  traMADol (ULTRAM) 50 MG tablet Take 1 tablet (50 mg total) by mouth every 6 (six) hours. 02/27/22   Edmisten, Kristie L, PA  triazolam (HALCION) 0.25 MG tablet Take by mouth. 09/26/22   [provider]      Allergies    Hydrocodone, Penicillins, Naproxen, Codeine, Doxycycline, Morphine and codeine, and Statins    Review of Systems   Review of Systems  All other systems reviewed and are negative.   Physical Exam Updated Vital Signs BP (!) 164/96   Pulse 95   Temp 98.5 F (36.9 C) (Oral)   Resp 18   Ht 5\' 8"  (1.727 m)   Wt 97.1 kg   SpO2 99%   BMI 32.54 kg/m  Physical Exam Vitals and nursing note reviewed.  Constitutional:      General: She is not in acute distress.    Appearance: Normal appearance.  HENT:     Head: Normocephalic and atraumatic.  Eyes:     General:        Right eye: No discharge.        Left eye: No discharge.  Cardiovascular:     Rate and Rhythm: Normal rate and regular rhythm.  Pulmonary:     Effort: Pulmonary effort is normal. No respiratory distress.  Musculoskeletal:        General: No deformity.     Comments: Patient with small around 4 cm suprapatellar effusion, some tenderness to palpation.  She has normal range of motion both passively and actively to flexion, extension of the affected joint.  No swelling of posterior knee, calf, no significant ligamentous laxity.  Skin:    General: Skin is warm and dry.  Neurological:     Mental Status: She is alert and oriented to person, place, and time.  Psychiatric:        Mood and Affect: Mood normal.        Behavior: Behavior normal.     ED Results / Procedures / Treatments   Labs (all labs ordered are listed, but only abnormal results are displayed) Labs  Reviewed - No data to display  EKG None  Radiology DG Knee Complete 4 Views Right  Result Date: 12/30/2022 CLINICAL DATA:  knee swelling EXAM: RIGHT KNEE - COMPLETE 4+ VIEW COMPARISON:  12/26/2013 FINDINGS: Interval knee arthroplasty, components project in expected location. No fracture or dislocation. Regional soft tissues unremarkable. IMPRESSION: Interval knee arthroplasty without acute findings. Electronically Signed   By: Corlis Leak M.D.   On: 12/30/2022 16:27    Procedures Procedures    Medications Ordered  in ED Medications - No data to display  ED Course/ Medical Decision Making/ A&P                             Medical Decision Making  This patient is a 57 y.o. female who presents to the ED for concern of knee swelling, pain.   Differential diagnoses prior to evaluation: Knee swelling, septic arthritis, suprapatellar effusion, cellulitis, gout, pseudogout, vs other  Past Medical History / Social History / Additional history: Chart reviewed. Pertinent results include: HIV, osteoarthritis of knee, status post knee replacement 8/23 with [facility]  Physical Exam: Physical exam performed. The pertinent findings include: Patient with small around 4 cm suprapatellar effusion, some tenderness to palpation.  She has normal range of motion both passively and actively to flexion, extension of the affected joint.  No swelling of posterior knee, calf, no significant ligamentous laxity.   Medications / Treatment: Encouraged ibuprofen, tylenol   Disposition: After consideration of the diagnostic results and the patients response to treatment, I feel that patient should follow up with orthopedics, otherwise encouraged RICE .   emergency department workup does not suggest an emergent condition requiring admission or immediate intervention beyond what has been performed at this time. The plan is: as above. The patient is safe for discharge and has been instructed to return immediately  for worsening symptoms, change in symptoms or any other concerns.  Final Clinical Impression(s) / ED Diagnoses Final diagnoses:  Knee swelling    Rx / DC Orders ED Discharge Orders     None         [place] 12/30/22 1737    Terald Sleeper, MD 12/30/22 1801

## 2022-12-30 NOTE — Discharge Instructions (Addendum)
Please use Tylenol or ibuprofen for pain.  You may use 600 mg ibuprofen every 6 hours or 1000 mg of Tylenol every 6 hours.  You may choose to alternate between the 2.  This would be most effective.  Not to exceed 4 g of Tylenol within 24 hours.  Not to exceed 3200 mg ibuprofen 24 hours.  

## 2022-12-31 ENCOUNTER — Telehealth: Payer: Self-pay

## 2022-12-31 NOTE — Telephone Encounter (Signed)
Patient attempted to be outreached by Donelle Hise on 12/31/22 to discuss hypertension. Left voicemail for patient to return our call at their convenience at [phone].  Kaevion Sinclair, Student-PharmD 

## 2023-01-01 ENCOUNTER — Ambulatory Visit: Payer: Self-pay | Admitting: Nurse Practitioner

## 2023-01-02 ENCOUNTER — Ambulatory Visit (HOSPITAL_BASED_OUTPATIENT_CLINIC_OR_DEPARTMENT_OTHER)
Admission: RE | Admit: 2023-01-02 | Discharge: 2023-01-02 | Disposition: A | Discharge status: Home / Self Care | Payer: 59 | Source: Ambulatory Visit | Attending: Internal Medicine | Admitting: Internal Medicine

## 2023-01-02 DIAGNOSIS — J329 Chronic sinusitis, unspecified: Secondary | ICD-10-CM | POA: Diagnosis not present

## 2023-01-02 DIAGNOSIS — J342 Deviated nasal septum: Secondary | ICD-10-CM | POA: Diagnosis not present

## 2023-01-02 DIAGNOSIS — J3489 Other specified disorders of nose and nasal sinuses: Secondary | ICD-10-CM | POA: Diagnosis not present

## 2023-01-03 ENCOUNTER — Telehealth: Payer: Self-pay

## 2023-01-03 ENCOUNTER — Ambulatory Visit (INDEPENDENT_AMBULATORY_CARE_PROVIDER_SITE_OTHER): Payer: Self-pay | Admitting: Licensed Clinical Social Worker

## 2023-01-03 ENCOUNTER — Encounter (HOSPITAL_COMMUNITY): Payer: Self-pay

## 2023-01-03 DIAGNOSIS — F3163 Bipolar disorder, current episode mixed, severe, without psychotic features: Secondary | ICD-10-CM

## 2023-01-03 DIAGNOSIS — F4310 Post-traumatic stress disorder, unspecified: Secondary | ICD-10-CM

## 2023-01-03 DIAGNOSIS — F411 Generalized anxiety disorder: Secondary | ICD-10-CM

## 2023-01-03 NOTE — Telephone Encounter (Signed)
Transition Care Management Unsuccessful Follow-up Telephone Call  Date of discharge and from where:  [place] 6/16  Attempts:  1st Attempt  Reason for unsuccessful TCM follow-up call:  Left voice message   Arda Keadle [facility], [place] [phone] [address], [place], [place] [address] Phone: [phone] Email: [email or web address]       

## 2023-01-03 NOTE — Progress Notes (Signed)
Contacted patient through My Chart and she did not respond

## 2023-01-04 ENCOUNTER — Telehealth: Payer: Self-pay

## 2023-01-04 NOTE — Telephone Encounter (Signed)
Transition Care Management Unsuccessful Follow-up Telephone Call  Date of discharge and from where:  [place] 6/16  Attempts:  2nd Attempt  Reason for unsuccessful TCM follow-up call:  Left voice message   Amyriah Buras [facility], [place] [phone] [address], [place], [place] [address] Phone: [phone] Email: [email or web address]       

## 2023-01-10 ENCOUNTER — Ambulatory Visit (HOSPITAL_COMMUNITY): Payer: 59 | Admitting: Licensed Clinical Social Worker

## 2023-01-10 ENCOUNTER — Encounter (HOSPITAL_COMMUNITY): Payer: Self-pay | Admitting: Licensed Clinical Social Worker

## 2023-01-10 ENCOUNTER — Encounter (HOSPITAL_COMMUNITY): Payer: Self-pay

## 2023-01-10 DIAGNOSIS — F4310 Post-traumatic stress disorder, unspecified: Secondary | ICD-10-CM

## 2023-01-10 DIAGNOSIS — F332 Major depressive disorder, recurrent severe without psychotic features: Secondary | ICD-10-CM

## 2023-01-10 DIAGNOSIS — F3163 Bipolar disorder, current episode mixed, severe, without psychotic features: Secondary | ICD-10-CM

## 2023-01-10 DIAGNOSIS — F411 Generalized anxiety disorder: Secondary | ICD-10-CM

## 2023-01-10 NOTE — Progress Notes (Signed)
Therapist contacted patient through My Chart for session and she did not respond

## 2023-01-15 ENCOUNTER — Encounter: Payer: Self-pay | Admitting: Nurse Practitioner

## 2023-01-15 ENCOUNTER — Other Ambulatory Visit: Payer: Self-pay | Admitting: Nurse Practitioner

## 2023-01-15 ENCOUNTER — Ambulatory Visit (INDEPENDENT_AMBULATORY_CARE_PROVIDER_SITE_OTHER): Payer: 59 | Admitting: Nurse Practitioner

## 2023-01-15 VITALS — BP 128/74 | HR 79 | Temp 97.2°F | Wt 222.0 lb

## 2023-01-15 DIAGNOSIS — E785 Hyperlipidemia, unspecified: Secondary | ICD-10-CM

## 2023-01-15 DIAGNOSIS — R768 Other specified abnormal immunological findings in serum: Secondary | ICD-10-CM

## 2023-01-15 DIAGNOSIS — E039 Hypothyroidism, unspecified: Secondary | ICD-10-CM

## 2023-01-15 DIAGNOSIS — M549 Dorsalgia, unspecified: Secondary | ICD-10-CM | POA: Diagnosis not present

## 2023-01-15 DIAGNOSIS — E1165 Type 2 diabetes mellitus with hyperglycemia: Secondary | ICD-10-CM | POA: Diagnosis not present

## 2023-01-15 DIAGNOSIS — M5416 Radiculopathy, lumbar region: Secondary | ICD-10-CM

## 2023-01-15 DIAGNOSIS — I10 Essential (primary) hypertension: Secondary | ICD-10-CM | POA: Diagnosis not present

## 2023-01-15 DIAGNOSIS — F17200 Nicotine dependence, unspecified, uncomplicated: Secondary | ICD-10-CM | POA: Diagnosis not present

## 2023-01-15 DIAGNOSIS — D649 Anemia, unspecified: Secondary | ICD-10-CM

## 2023-01-15 DIAGNOSIS — F332 Major depressive disorder, recurrent severe without psychotic features: Secondary | ICD-10-CM

## 2023-01-15 DIAGNOSIS — N3941 Urge incontinence: Secondary | ICD-10-CM

## 2023-01-15 DIAGNOSIS — F3163 Bipolar disorder, current episode mixed, severe, without psychotic features: Secondary | ICD-10-CM

## 2023-01-15 LAB — POCT GLYCOSYLATED HEMOGLOBIN (HGB A1C): Hemoglobin A1C: 6 % — AB (ref 4.0–5.6)

## 2023-01-15 MED ORDER — CYCLOBENZAPRINE HCL 10 MG PO TABS
ORAL_TABLET | ORAL | 0 refills | Status: AC
Start: 2023-01-15 — End: ?

## 2023-01-15 NOTE — Assessment & Plan Note (Signed)
Lab Results  Component Value Date   HGBA1C 6.0 (A) 01/15/2023   HGBA1C F 01/15/2023  Well-controlled on metformin 500 mg twice daily Continue current medication Patient counseled on low-carb modified diet Checking urine creatinine lab On Zetia 10 mg daily  she is intolerant to statin Not on ACE or ARB but her blood pressure is well-controlled
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
## 2023-01-15 NOTE — Assessment & Plan Note (Signed)
sHe is followed by psychiatrist and goes for counseling Continue duloxetine 60 mg daily, Seroquel 100 mg daily She was encouraged to maintain close follow-up with a psychiatrist and counselor 

## 2023-01-15 NOTE — Assessment & Plan Note (Signed)
    Latest Ref Rng & Units 12/18/2022   10:20 AM  Immunoglobulin Electrophoresis  IgG 586 - 1,602 mg/dL 1,610   IgM 26 - 960 mg/dL 32   CT scan was negative for chronic sinusitis Patient will follow-up with the allergist.

## 2023-01-15 NOTE — Assessment & Plan Note (Addendum)
Lab Results  Component Value Date   CHOL 190 09/10/2022   HDL 28 (L) 09/10/2022   LDLCALC 103 (H) 09/10/2022   TRIG 345 (H) 09/10/2022   CHOLHDL 6.8 (H) 09/10/2022  She is on Vascepa 2 g twice daily for hypertriglyceridemia On Zetia 10 mg daily for hyperlipidemia states that she does not take medication regularly, need to take medication daily as ordered discussed Checking fasting lipid panel Avoid simple carbohydrates, fatty fried foods LDL goal is less than 70

## 2023-01-15 NOTE — Assessment & Plan Note (Signed)
sHe is followed by psychiatrist and goes for counseling Continue duloxetine 60 mg daily, Seroquel 100 mg daily She was encouraged to maintain close follow-up with a psychiatrist and counselor

## 2023-01-15 NOTE — Assessment & Plan Note (Signed)
Flexeril refilled She is also on Cymbalta and Lyrica

## 2023-01-15 NOTE — Assessment & Plan Note (Signed)
On levothyroxine 100 mcg daily Check TSH T4 levels 

## 2023-01-15 NOTE — Assessment & Plan Note (Signed)
Smokes about 5 cigarettes daily  Asked about quitting: confirms that he/she currently smokes cigarettes Advise to quit smoking: Educated about QUITTING to reduce the risk of cancer, cardio and cerebrovascular disease. Assess willingness: Unwilling to quit at this time, but is working on cutting back. Assist with counseling and pharmacotherapy: Counseled for 5 minutes and literature provided. Arrange for follow up: follow up in 3 months and continue to offer help.

## 2023-01-15 NOTE — Progress Notes (Signed)
CT scan was negative for chronic sinusitis or nasal polyps.  Can someone let patient know?

## 2023-01-15 NOTE — Assessment & Plan Note (Signed)
BP Readings from Last 3 Encounters:  01/15/23 128/74  12/30/22 (!) 164/96  12/18/22 (!) 150/88   HTN Controlled on hydrochlorothiazide 25 mg daily Continue current medications. No changes in management. Discussed DASH diet and dietary sodium restrictions Continue to increase dietary efforts and exercise.

## 2023-01-15 NOTE — Assessment & Plan Note (Signed)
She is not interested in medication Educational material on kegel  exercises provided

## 2023-01-15 NOTE — Patient Instructions (Signed)

## 2023-01-16 ENCOUNTER — Ambulatory Visit (HOSPITAL_COMMUNITY): Payer: 59 | Admitting: Licensed Clinical Social Worker

## 2023-01-16 LAB — MICROALBUMIN / CREATININE URINE RATIO
Creatinine, Urine: 87.6 mg/dL
Microalb/Creat Ratio: <3 mg/g creat (ref 0–29)
Microalbumin, Urine: <3 ug/mL

## 2023-01-21 ENCOUNTER — Other Ambulatory Visit: Payer: Self-pay

## 2023-02-07 ENCOUNTER — Other Ambulatory Visit: Payer: Self-pay | Admitting: Nurse Practitioner

## 2023-02-07 DIAGNOSIS — E039 Hypothyroidism, unspecified: Secondary | ICD-10-CM
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
## 2023-02-27 ENCOUNTER — Telehealth (INDEPENDENT_AMBULATORY_CARE_PROVIDER_SITE_OTHER): Payer: 59 | Admitting: Psychiatry

## 2023-02-27 ENCOUNTER — Encounter (HOSPITAL_COMMUNITY): Payer: Self-pay | Admitting: Psychiatry

## 2023-02-27 DIAGNOSIS — G629 Polyneuropathy, unspecified: Secondary | ICD-10-CM

## 2023-02-27 DIAGNOSIS — F4310 Post-traumatic stress disorder, unspecified: Secondary | ICD-10-CM | POA: Diagnosis not present

## 2023-02-27 DIAGNOSIS — F3163 Bipolar disorder, current episode mixed, severe, without psychotic features: Secondary | ICD-10-CM | POA: Diagnosis not present

## 2023-02-27 DIAGNOSIS — F411 Generalized anxiety disorder: Secondary | ICD-10-CM

## 2023-02-27 DIAGNOSIS — F332 Major depressive disorder, recurrent severe without psychotic features: Secondary | ICD-10-CM

## 2023-02-27 MED ORDER — QUETIAPINE FUMARATE 100 MG PO TABS: 100.0000 mg | ORAL_TABLET | Freq: Every day | ORAL | 2 refills | Status: DC

## 2023-02-27 MED ORDER — DULOXETINE HCL 60 MG PO CPEP: 60.0000 mg | ORAL_CAPSULE | Freq: Every day | ORAL | 0 refills | Status: DC
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
## 2023-03-06 ENCOUNTER — Other Ambulatory Visit: Payer: Self-pay | Admitting: Nurse Practitioner

## 2023-03-06 DIAGNOSIS — E1165 Type 2 diabetes mellitus with hyperglycemia: Secondary | ICD-10-CM

## 2023-03-09 ENCOUNTER — Other Ambulatory Visit (HOSPITAL_COMMUNITY): Payer: Self-pay | Admitting: Psychiatry

## 2023-03-09 DIAGNOSIS — F332 Major depressive disorder, recurrent severe without psychotic features: Secondary | ICD-10-CM

## 2023-03-31 ENCOUNTER — Other Ambulatory Visit (HOSPITAL_COMMUNITY): Payer: Self-pay | Admitting: Psychiatry

## 2023-03-31 DIAGNOSIS — G629 Polyneuropathy, unspecified: Secondary | ICD-10-CM

## 2023-04-01 ENCOUNTER — Other Ambulatory Visit (HOSPITAL_COMMUNITY): Payer: Self-pay | Admitting: Psychiatry

## 2023-04-01 DIAGNOSIS — F332 Major depressive disorder, recurrent severe without psychotic features: Secondary | ICD-10-CM

## 2023-04-15 ENCOUNTER — Other Ambulatory Visit: Payer: Self-pay | Admitting: Nurse Practitioner

## 2023-04-15 DIAGNOSIS — M549 Dorsalgia, unspecified: Secondary | ICD-10-CM

## 2023-04-15 MED ORDER — CYCLOBENZAPRINE HCL 5 MG PO TABS: 5.0000 mg | ORAL_TABLET | Freq: Every evening | ORAL | 0 refills | Status: DC | PRN

## 2023-04-16 ENCOUNTER — Other Ambulatory Visit (HOSPITAL_COMMUNITY): Payer: Self-pay | Admitting: Psychiatry

## 2023-04-16 DIAGNOSIS — F332 Major depressive disorder, recurrent severe without psychotic features: Secondary | ICD-10-CM

## 2023-04-19 DIAGNOSIS — J302 Other seasonal allergic rhinitis: Secondary | ICD-10-CM | POA: Diagnosis not present

## 2023-04-19 DIAGNOSIS — S01311A Laceration without foreign body of right ear, initial encounter: Secondary | ICD-10-CM | POA: Diagnosis not present

## 2023-04-19 DIAGNOSIS — I10 Essential (primary) hypertension: Secondary | ICD-10-CM | POA: Diagnosis not present

## 2023-04-19 DIAGNOSIS — F17210 Nicotine dependence, cigarettes, uncomplicated: Secondary | ICD-10-CM | POA: Diagnosis not present

## 2023-04-22 ENCOUNTER — Ambulatory Visit (INDEPENDENT_AMBULATORY_CARE_PROVIDER_SITE_OTHER): Payer: 59 | Admitting: Nurse Practitioner

## 2023-04-22 ENCOUNTER — Encounter: Payer: Self-pay | Admitting: Nurse Practitioner

## 2023-04-22 VITALS — BP 152/83 | HR 77 | Wt 229.0 lb

## 2023-04-22 DIAGNOSIS — Z23 Encounter for immunization: Secondary | ICD-10-CM | POA: Diagnosis not present

## 2023-04-22 DIAGNOSIS — M25511 Pain in right shoulder: Secondary | ICD-10-CM | POA: Diagnosis not present

## 2023-04-22 DIAGNOSIS — G8929 Other chronic pain: Secondary | ICD-10-CM | POA: Diagnosis not present

## 2023-04-22 DIAGNOSIS — S01311D Laceration without foreign body of right ear, subsequent encounter: Secondary | ICD-10-CM | POA: Diagnosis not present

## 2023-04-22 DIAGNOSIS — D649 Anemia, unspecified: Secondary | ICD-10-CM

## 2023-04-22 DIAGNOSIS — I10 Essential (primary) hypertension: Secondary | ICD-10-CM

## 2023-04-22 DIAGNOSIS — E039 Hypothyroidism, unspecified: Secondary | ICD-10-CM

## 2023-04-22 DIAGNOSIS — E1165 Type 2 diabetes mellitus with hyperglycemia: Secondary | ICD-10-CM | POA: Diagnosis not present

## 2023-04-22 DIAGNOSIS — E785 Hyperlipidemia, unspecified: Secondary | ICD-10-CM

## 2023-04-22 DIAGNOSIS — S01311A Laceration without foreign body of right ear, initial encounter: Secondary | ICD-10-CM | POA: Insufficient documentation

## 2023-04-22 DIAGNOSIS — M25512 Pain in left shoulder: Secondary | ICD-10-CM | POA: Diagnosis not present

## 2023-04-22 DIAGNOSIS — J302 Other seasonal allergic rhinitis: Secondary | ICD-10-CM | POA: Diagnosis not present

## 2023-04-22 MED ORDER — KETOROLAC TROMETHAMINE 30 MG/ML IJ SOLN
30.0000 mg | Freq: Once | INTRAMUSCULAR | Status: AC
Start: 2023-04-22 — End: 2023-04-22
  Administered 2023-04-22: 30 mg via INTRAMUSCULAR

## 2023-04-22 NOTE — Assessment & Plan Note (Signed)
Lab Results  Component Value Date   HGBA1C 6.0 (A) 01/15/2023   HGBA1C F 01/15/2023  Currently well-controlled on metformin 500 mg twice daily Continue current medication, on Zetia 10 mg daily for hyperlipidemia Avoid sugar sweets soda

## 2023-04-22 NOTE — Assessment & Plan Note (Signed)
Continue Flonase nasal spray 2 spray into both nostrils daily, montelukast 10 mg daily

## 2023-04-22 NOTE — Assessment & Plan Note (Addendum)
Stated that she has been taking Zetia 10 mg daily but it appears that the medication was last refilled for 90 days in February 2024 On Vascepa 2 g twice daily Checking direct today, will add on lipid panel for elevated triglycerides Avoid fatty fried foods Lab Results  Component Value Date   CHOL 190 09/10/2022   HDL 28 (L) 09/10/2022   LDLCALC 103 (H) 09/10/2022   TRIG 345 (H) 09/10/2022   CHOLHDL 6.8 (H) 09/10/2022

## 2023-04-22 NOTE — Assessment & Plan Note (Signed)
Patient educated on CDC recommendation for the vaccine. Verbal consent was obtained from the patient, vaccine administered by nurse, no sign of adverse reactions noted at this time. Patient education on arm soreness and use of tylenol  for this patient  was discussed. Patient educated on the signs and symptoms of adverse effect and advise to contact the office if they occur. Vaccine information sheet given to patient.  

## 2023-04-22 NOTE — Assessment & Plan Note (Signed)
BP Readings from Last 3 Encounters:  04/22/23 (!) 152/83  01/15/23 128/74  12/30/22 (!) 164/96  Hypertension has been previously well-controlled on hydrochlorothiazide 25 mg daily elevated today probably due to pain Continue hydrochlorothiazide 25 mg daily DASH diet encouraged, engage in regular moderate exercises at least [place] 50 minutes weekly as tolerated.

## 2023-04-22 NOTE — Assessment & Plan Note (Addendum)
Patient encouraged to take OTC Tylenol 650 mg every 6 hours as needed, application of heat or ice to the affected ear for pain encouraged Toradol 30 mg IM injection given in the office today

## 2023-04-22 NOTE — Assessment & Plan Note (Signed)
Continue Lyrica 25 mg twice daily Take OTC Tylenol 650 mg every 6 hours as needed Application of heat or ice encouraged Toradol 30 mg IM injection given in the office today
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
## 2023-04-22 NOTE — Assessment & Plan Note (Signed)
Lab Results  Component Value Date   WBC 6.8 12/06/2022   HGB 10.7 (L) 12/06/2022   HCT 32.8 (L) 12/06/2022   MCV 89.6 12/06/2022   PLT 139 (L) 12/06/2022  Will recheck CBC, check iron panel

## 2023-04-22 NOTE — Assessment & Plan Note (Signed)
Lab Results  Component Value Date   TSH 4.580 (H) 09/10/2022  Currently on levothyroxine 100 mcg daily Checking TSH T4 levels

## 2023-04-23 ENCOUNTER — Other Ambulatory Visit: Payer: Self-pay | Admitting: Nurse Practitioner

## 2023-04-23 DIAGNOSIS — E039 Hypothyroidism, unspecified: Secondary | ICD-10-CM

## 2023-04-23 LAB — CBC
Hematocrit: 32.2 % — ABNORMAL LOW (ref 34.0–46.6)
Hemoglobin: 10.7 g/dL — ABNORMAL LOW (ref 11.1–15.9)
MCH: 29.5 pg (ref 26.6–33.0)
MCHC: 33.2 g/dL (ref 31.5–35.7)
MCV: 89 fL (ref 79–97)
Platelets: 110 10*3/uL — ABNORMAL LOW (ref 150–450)
RBC: 3.63 x10E6/uL — ABNORMAL LOW (ref 3.77–5.28)
RDW: 13.5 % (ref 11.7–15.4)
WBC: 6.3 10*3/uL (ref 3.4–10.8)

## 2023-04-23 LAB — IRON,TIBC AND FERRITIN PANEL
Ferritin: 22 ng/mL (ref 15–150)
Iron Saturation: 20 % (ref 15–55)
Iron: 81 ug/dL (ref 27–159)
Total Iron Binding Capacity: 409 ug/dL (ref 250–450)
UIBC: 328 ug/dL (ref 131–425)

## 2023-04-23 LAB — TSH+FREE T4
Free T4: 0.95 ng/dL (ref 0.82–1.77)
TSH: 5.74 u[IU]/mL — ABNORMAL HIGH (ref 0.450–4.500)

## 2023-04-23 LAB — LDL CHOLESTEROL, DIRECT: LDL Direct: 70 mg/dL (ref 0–99)

## 2023-04-23 MED ORDER — LEVOTHYROXINE SODIUM 112 MCG PO TABS
112.0000 ug | ORAL_TABLET | Freq: Every day | ORAL | 1 refills | Status: DC
Start: 2023-04-23 — End: 2023-05-10

## 2023-04-25 ENCOUNTER — Other Ambulatory Visit: Payer: Self-pay

## 2023-04-25 ENCOUNTER — Other Ambulatory Visit: Payer: Self-pay | Admitting: Internal Medicine

## 2023-04-25 ENCOUNTER — Other Ambulatory Visit (HOSPITAL_COMMUNITY): Payer: Self-pay

## 2023-04-25 DIAGNOSIS — Z21 Asymptomatic human immunodeficiency virus [HIV] infection status: Secondary | ICD-10-CM

## 2023-04-25 MED ORDER — MONTELUKAST SODIUM 10 MG PO TABS: 10.0000 mg | ORAL_TABLET | Freq: Every day | ORAL | 1 refills | Status: DC

## 2023-04-25 MED ORDER — BICTEGRAVIR-EMTRICITAB-TENOFOV 50-200-25 MG PO TABS: 1.0000 | ORAL_TABLET | Freq: Every day | ORAL | 1 refills | Status: DC

## 2023-04-25 NOTE — Telephone Encounter (Signed)
Tom from [facility] calling for a refill of montelukast 10 mg for the patient. Sent in 90 day supply with 1 refill. Phone number to [facility] is [phone]

## 2023-04-27 ENCOUNTER — Other Ambulatory Visit: Payer: Self-pay | Admitting: Nurse Practitioner

## 2023-04-27 DIAGNOSIS — I10 Essential (primary) hypertension: Secondary | ICD-10-CM

## 2023-05-01 ENCOUNTER — Telehealth (INDEPENDENT_AMBULATORY_CARE_PROVIDER_SITE_OTHER): Payer: 59 | Admitting: Psychiatry

## 2023-05-01 ENCOUNTER — Encounter (HOSPITAL_COMMUNITY): Payer: Self-pay | Admitting: Psychiatry

## 2023-05-01 DIAGNOSIS — F332 Major depressive disorder, recurrent severe without psychotic features: Secondary | ICD-10-CM

## 2023-05-01 DIAGNOSIS — F4310 Post-traumatic stress disorder, unspecified: Secondary | ICD-10-CM | POA: Diagnosis not present

## 2023-05-01 DIAGNOSIS — F411 Generalized anxiety disorder: Secondary | ICD-10-CM

## 2023-05-01 DIAGNOSIS — F3163 Bipolar disorder, current episode mixed, severe, without psychotic features: Secondary | ICD-10-CM | POA: Diagnosis not present
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
## 2023-05-07 ENCOUNTER — Other Ambulatory Visit (HOSPITAL_COMMUNITY): Payer: Self-pay | Admitting: Psychiatry

## 2023-05-07 DIAGNOSIS — F332 Major depressive disorder, recurrent severe without psychotic features: Secondary | ICD-10-CM

## 2023-05-09 NOTE — Telephone Encounter (Signed)
Caller & Relationship to patient:  MRN #  [id number]   Call Back Number:   Date of Last Office Visit: 12/06/2022     Date of Next Office Visit: 04/25/2023    Medication(s) to be Refilled: cyclobenzaprine 5, levothyroxine , cetirizine  Preferred Pharmacy: 90  day RX  ** Please notify patient to allow 48-72 hours to process** **Let patient know to contact pharmacy at the end of the day to make sure medication is ready. ** **If patient has not been seen in a year or longer, book an appointment **Advise to use MyChart for refill requests OR to contact their pharmacy

## 2023-05-10 ENCOUNTER — Other Ambulatory Visit: Payer: Self-pay

## 2023-05-10 DIAGNOSIS — E039 Hypothyroidism, unspecified: Secondary | ICD-10-CM

## 2023-05-10 DIAGNOSIS — J309 Allergic rhinitis, unspecified: Secondary | ICD-10-CM

## 2023-05-10 MED ORDER — CETIRIZINE HCL 10 MG PO TABS: 10.0000 mg | ORAL_TABLET | Freq: Every day | ORAL | 1 refills | Status: AC

## 2023-05-10 MED ORDER — CYCLOBENZAPRINE HCL 5 MG PO TABS: 5.0000 mg | ORAL_TABLET | Freq: Every evening | ORAL | 0 refills | Status: DC | PRN

## 2023-05-10 MED ORDER — LEVOTHYROXINE SODIUM 112 MCG PO TABS: 112.0000 ug | ORAL_TABLET | Freq: Every day | ORAL | 1 refills | Status: DC

## 2023-05-10 NOTE — Telephone Encounter (Signed)
Done KH 

## 2023-05-10 NOTE — Telephone Encounter (Signed)
Pt is requesting a 90 script . Please advise Kh

## 2023-05-14 ENCOUNTER — Ambulatory Visit (INDEPENDENT_AMBULATORY_CARE_PROVIDER_SITE_OTHER): Payer: 59 | Admitting: Sports Medicine

## 2023-05-14 ENCOUNTER — Encounter: Payer: Self-pay | Admitting: Sports Medicine

## 2023-05-14 VITALS — BP 138/88 | Ht 68.0 in | Wt 229.0 lb

## 2023-05-14 DIAGNOSIS — M5412 Radiculopathy, cervical region: Secondary | ICD-10-CM | POA: Diagnosis not present

## 2023-05-14 DIAGNOSIS — M5030 Other cervical disc degeneration, unspecified cervical region: Secondary | ICD-10-CM

## 2023-05-14 NOTE — Progress Notes (Signed)
Subjective:    Patient ID: Cheryl Thompson, female    DOB: [birth year], 57 y.o.   MRN: [id number]  HPI chief complaint: Neck pain  Patient is a very pleasant 57 year old ambidextrous female that presents today with 2 months of worsening neck pain.  Pain is diffuse around the neck and will radiate pain into her shoulders.  It is not associated with any specific activity.  Was initially intermittent but has become more constant.  She is also experiencing numbness and tingling into both arms.  She also experiences spasm in her back.  She has found cyclobenzaprine and over-the-counter BC powders to be helpful.  She takes both of these intermittently.  She denies any prior neck surgery.  An x-ray of her cervical spine done in February 2023 shows mild to moderate degenerative disc disease at C5-C6 and C6-C7.  Past medical history reviewed Medications reviewed Allergies reviewed  Review of Systems As above    Objective:   Physical Exam  Well-developed, well-nourished.  No acute distress  Cervical spine: There is limited cervical range of motion in all planes.  She does have reproducible pain with this.  She is diffusely tender to palpation around the cervical spine and paraspinal musculature.  No spasm.  Neurological exam shows no focal strength deficit of either upper extremity.  Reflexes are brisk and equal at the biceps, triceps, and brachial radialis tendons.  No atrophy noted.  X-rays are as above      Assessment & Plan:   Neck pain with bilateral cervical radiculopathy secondary to cervical degenerative disc disease and possible spinal stenosis  We will start with physical therapy.  If symptoms persist after approximately 4 weeks of PT then consider MRI to rule out cervical spinal stenosis.  In the meantime, she may continue with her cyclobenzaprine and BC powder as needed.  She is cautioned about GI upset with BC powder.  This note was dictated using Dragon naturally speaking  software and may contain errors in syntax, spelling, or content which have not been identified prior to signing this note.

## 2023-05-17 ENCOUNTER — Encounter: Payer: Self-pay | Admitting: Physical Therapy

## 2023-05-17 ENCOUNTER — Ambulatory Visit: Payer: 59 | Attending: Sports Medicine | Admitting: Physical Therapy

## 2023-05-17 ENCOUNTER — Other Ambulatory Visit: Payer: Self-pay

## 2023-05-17 DIAGNOSIS — M5412 Radiculopathy, cervical region: Secondary | ICD-10-CM | POA: Diagnosis not present

## 2023-05-17 DIAGNOSIS — M5030 Other cervical disc degeneration, unspecified cervical region: Secondary | ICD-10-CM | POA: Diagnosis not present

## 2023-05-17 DIAGNOSIS — M542 Cervicalgia: Secondary | ICD-10-CM | POA: Diagnosis not present

## 2023-05-17 DIAGNOSIS — R252 Cramp and spasm: Secondary | ICD-10-CM | POA: Diagnosis not present

## 2023-05-23 ENCOUNTER — Other Ambulatory Visit: Payer: Self-pay | Admitting: Internal Medicine

## 2023-05-29 ENCOUNTER — Ambulatory Visit: Payer: 59

## 2023-05-29 DIAGNOSIS — R252 Cramp and spasm: Secondary | ICD-10-CM | POA: Diagnosis not present

## 2023-05-29 DIAGNOSIS — M5412 Radiculopathy, cervical region: Secondary | ICD-10-CM | POA: Diagnosis not present

## 2023-05-29 DIAGNOSIS — M5030 Other cervical disc degeneration, unspecified cervical region: Secondary | ICD-10-CM | POA: Diagnosis not present

## 2023-05-29 DIAGNOSIS — M542 Cervicalgia: Secondary | ICD-10-CM
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
## 2023-06-01 ENCOUNTER — Other Ambulatory Visit: Payer: Self-pay | Admitting: Internal Medicine

## 2023-06-01 DIAGNOSIS — Z21 Asymptomatic human immunodeficiency virus [HIV] infection status: Secondary | ICD-10-CM

## 2023-06-03 ENCOUNTER — Other Ambulatory Visit (HOSPITAL_COMMUNITY): Payer: Self-pay | Admitting: Psychiatry

## 2023-06-03 ENCOUNTER — Ambulatory Visit: Payer: 59

## 2023-06-03 DIAGNOSIS — G629 Polyneuropathy, unspecified: Secondary | ICD-10-CM

## 2023-06-04 ENCOUNTER — Ambulatory Visit: Payer: 59

## 2023-06-06 ENCOUNTER — Ambulatory Visit: Payer: 59

## 2023-06-07 ENCOUNTER — Other Ambulatory Visit: Payer: Self-pay | Admitting: Nurse Practitioner

## 2023-06-07 DIAGNOSIS — E039 Hypothyroidism, unspecified: Secondary | ICD-10-CM

## 2023-06-07 MED ORDER — LEVOTHYROXINE SODIUM 112 MCG PO TABS: 112.0000 ug | ORAL_TABLET | Freq: Every day | ORAL | 1 refills | Status: DC

## 2023-06-07 NOTE — Telephone Encounter (Unsigned)
Copied from CRM [id number]. Topic: Clinical - Medication Refill >> Jun 07, 2023 10:03 AM Desma Mcgregor wrote: Most Recent Primary Care Visit:  Provider: Donell Beers  Department: [facility]  Visit Type: OFFICE VISIT  Date: 04/22/2023  Medication: levothyroxine (SYNTHROID) 112 MCG tablet  Has the patient contacted their pharmacy? Yes (Agent: If no, request that the patient contact the pharmacy for the refill. If patient does not wish to contact the pharmacy document the reason why and proceed with request.) (Agent: If yes, when and what did the pharmacy advise?) No refills   Is this the correct pharmacy for this prescription? Yes If no, delete pharmacy and type the correct one.  This is the patient's preferred pharmacy:  [id number] - [place], [place] - [address] [place] [address] Phone: [phone] Fax: [phone]  [facility] - [place], [place] - [address] [place] [address] Phone: [phone] Fax: [phone]  Has the prescription been filled recently? {yes/no:20286}  Is the patient out of the medication? {yes/no:20286}  Has the patient been seen for an appointment in the last year OR does the patient have an upcoming appointment? {yes/no:20286}  Can we respond through MyChart? {yes/no:20286}  Agent: Please be advised that Rx refills may take up to 3 business days. We ask that you follow-up with your pharmacy.

## 2023-06-08 ENCOUNTER — Other Ambulatory Visit: Payer: Self-pay | Admitting: Nurse Practitioner

## 2023-06-08 DIAGNOSIS — E039 Hypothyroidism, unspecified: Secondary | ICD-10-CM

## 2023-06-10 ENCOUNTER — Other Ambulatory Visit: Payer: Self-pay

## 2023-06-10 ENCOUNTER — Emergency Department (HOSPITAL_BASED_OUTPATIENT_CLINIC_OR_DEPARTMENT_OTHER)
Admission: EM | Admit: 2023-06-10 | Discharge: 2023-06-10 | Disposition: A | Discharge status: Home / Self Care | Payer: Worker's Compensation | Attending: Emergency Medicine | Admitting: Emergency Medicine

## 2023-06-10 ENCOUNTER — Encounter (HOSPITAL_BASED_OUTPATIENT_CLINIC_OR_DEPARTMENT_OTHER): Payer: Self-pay | Admitting: Emergency Medicine

## 2023-06-10 ENCOUNTER — Telehealth: Payer: Self-pay

## 2023-06-10 DIAGNOSIS — S61012A Laceration without foreign body of left thumb without damage to nail, initial encounter: Secondary | ICD-10-CM | POA: Insufficient documentation

## 2023-06-10 DIAGNOSIS — W268XXA Contact with other sharp object(s), not elsewhere classified, initial encounter: Secondary | ICD-10-CM | POA: Diagnosis not present

## 2023-06-10 DIAGNOSIS — Y93G1 Activity, food preparation and clean up: Secondary | ICD-10-CM | POA: Insufficient documentation

## 2023-06-10 MED ORDER — LIDOCAINE HCL (PF) 1 % IJ SOLN
5.0000 mL | Freq: Once | INTRAMUSCULAR | Status: AC
  Administered 2023-06-10: 5 mL
  Filled 2023-06-10: qty 5

## 2023-06-10 MED ORDER — ACETAMINOPHEN 500 MG PO TABS
500.0000 mg | ORAL_TABLET | Freq: Once | ORAL | Status: AC
  Administered 2023-06-10: 500 mg via ORAL
  Filled 2023-06-10: qty 1

## 2023-06-10 MED ORDER — CEPHALEXIN 500 MG PO CAPS: 500.0000 mg | ORAL_CAPSULE | Freq: Three times a day (TID) | ORAL | 0 refills | Status: AC

## 2023-06-10 MED ORDER — LIDOCAINE HCL (PF) 1 % IJ SOLN: 5.0000 mL | Freq: Once | INTRAMUSCULAR | Status: DC

## 2023-06-10 NOTE — ED Provider Notes (Signed)
EMERGENCY DEPARTMENT AT [facility] Provider Note   CSN: [id number] Arrival date & time: 06/10/23  1232     History  Chief Complaint  Patient presents with   Finger Injury    Cheryl Thompson is a 57 y.o. female.  57 year old female presents today for concern of laceration to left thumb.  She was slicing onions sliced her thumb.  Had her tetanus shot updated last year.  No other complaints today.  Patient initially presented to the emergency department but then was called by the employer and directed to the employee clinic in [place].  Once they evaluated the wound they felt that they could not repair the wound and referred her back to the emergency department.  The history is provided by the patient. No language interpreter was used.       Home Medications Prior to Admission medications   Medication Sig Start Date End Date Taking? Authorizing Provider  cephALEXin (KEFLEX) 500 MG capsule Take 1 capsule (500 mg total) by mouth 3 (three) times daily for 7 days. 06/10/23 06/17/23 Yes Aanika Defoor, PA-C  albuterol (VENTOLIN HFA) 108 (90 Base) MCG/ACT inhaler Inhale 2 puffs into the lungs every 6 (six) hours as needed for wheezing or shortness of breath. 12/07/22   Paseda, Baird Kay, FNP  Azelastine-Fluticasone 137-50 MCG/ACT SUSP Place 1 spray into the nose in the morning and at bedtime. 11/20/22   Ferol Luz, MD  BIKTARVY 50-200-25 MG TABS tablet TAKE ONE TABLET BY MOUTH DAILY (PATIENT NEEDS APPOINTMENT) 06/03/23   Comer, Belia Heman, MD  cetirizine (ZYRTEC) 10 MG tablet Take 1 tablet (10 mg total) by mouth daily. 05/10/23   Paseda, Baird Kay, FNP  cyclobenzaprine (FLEXERIL) 5 MG tablet Take 1 tablet (5 mg total) by mouth at bedtime as needed for muscle spasms. 05/10/23   Donell Beers, FNP  DULoxetine (CYMBALTA) 60 MG capsule TAKE 1 CAPSULE BY MOUTH DAILY 06/03/23   Thresa Ross, MD  ezetimibe (ZETIA) 10 MG tablet Take 1 tablet (10 mg total) by  mouth daily. 09/11/22   Paseda, Baird Kay, FNP  fluticasone (FLONASE) 50 MCG/ACT nasal spray Place 2 sprays into both nostrils daily. 09/10/22   Paseda, Baird Kay, FNP  hydrochlorothiazide (HYDRODIURIL) 25 MG tablet TAKE 1 TABLET BY MOUTH DAILY FOR HIGH BLOOD PRESSURE 04/29/23   Paseda, Baird Kay, FNP  hydrOXYzine (ATARAX) 25 MG tablet Take 1 tablet (25 mg total) by mouth daily as needed for anxiety. 07/02/22   Paseda, Baird Kay, FNP  hyoscyamine (LEVSIN SL) 0.125 MG SL tablet Place 1 tablet (0.125 mg total) under the tongue every 4 (four) hours as needed. 10/16/21   Massie Maroon, FNP  icosapent Ethyl (VASCEPA) 1 g capsule Take 2 capsules (2 g total) by mouth 2 (two) times daily. 11/01/22   Paseda, Baird Kay, FNP  ipratropium (ATROVENT) 0.06 % nasal spray Place 2 sprays into both nostrils 4 (four) times daily. 11/20/22   Ferol Luz, MD  levETIRAcetam (KEPPRA) 500 MG tablet TAKE 1 TABLET(500 MG) BY MOUTH TWICE DAILY Patient not taking: Reported on 05/17/2023 09/07/20   Massie Maroon, FNP  levothyroxine (SYNTHROID) 112 MCG tablet TAKE ONE TABLET BY MOUTH DAILY AT 8AM BEFORE BREAKFAST 06/10/23   Paseda, Baird Kay, FNP  metFORMIN (GLUCOPHAGE) 500 MG tablet TAKE 1 TABLET BY MOUTH TWICE  DAILY WITH A MEAL 03/06/23   Paseda, Baird Kay, FNP  montelukast (SINGULAIR) 10 MG tablet Take 1 tablet (10 mg total) by mouth at bedtime. 04/25/23  Ferol Luz, MD  pregabalin (LYRICA) 25 MG capsule Take 1 capsule by mouth twice daily 12/04/22   Thresa Ross, MD  QUEtiapine (SEROQUEL) 100 MG tablet TAKE 1 TABLET BY MOUTH AT  BEDTIME 05/07/23   Thresa Ross, MD      Allergies    Hydrocodone, Penicillins, Naproxen, Codeine, Doxycycline, Morphine and codeine, and Statins    Review of Systems   Review of Systems  Constitutional:  Negative for fever.  Skin:  Positive for wound.  All other systems reviewed and are negative.   Physical Exam Updated Vital Signs BP (!) 155/90 (BP Location:  Left Arm)   Pulse 97   Temp 98.5 F (36.9 C) (Oral)   Resp 18   Wt 101.6 kg   SpO2 99%   BMI 34.06 kg/m  Physical Exam Vitals and nursing note reviewed.  Constitutional:      General: She is not in acute distress.    Appearance: Normal appearance. She is not ill-appearing.  HENT:     Head: Normocephalic and atraumatic.     Nose: Nose normal.  Eyes:     Conjunctiva/sclera: Conjunctivae normal.  Cardiovascular:     Rate and Rhythm: Normal rate and regular rhythm.  Pulmonary:     Effort: Pulmonary effort is normal. No respiratory distress.  Musculoskeletal:        General: No deformity.  Skin:    Findings: No rash.     Comments: 3 cm laceration on left thumb.  Slight active bleed noted.  Neurovascularly intact.  Good range of motion in the left thumb.   Neurological:     Mental Status: She is alert.     ED Results / Procedures / Treatments   Labs (all labs ordered are listed, but only abnormal results are displayed) Labs Reviewed - No data to display  EKG None  Radiology No results found.  Procedures .Marland KitchenLac repair [place] Early  Date/Time: 06/10/2023 5:29 PM  Performed by: Marita Kansas, PA-C Authorized by: Marita Kansas, PA-C   Consent:    Consent obtained:  Verbal   Consent given by:  Patient   Risks discussed:  Need for additional repair, infection, retained foreign body, poor cosmetic result and poor wound healing   Alternatives discussed:  No treatment Universal protocol:    Procedure explained and questions answered to patient or proxy's satisfaction: yes     Relevant documents present and verified: yes     Patient identity confirmed:  Verbally with patient and arm band Laceration details:    Location:  Finger   Finger location:  L thumb   Length (cm):  3 Pre-procedure details:    Preparation:  Patient was prepped and draped in usual sterile fashion Treatment:    Area cleansed with:  Saline and povidone-iodine   Amount of cleaning:  Extensive   Irrigation  solution:  Sterile saline   Irrigation method:  Tap   Debridement:  None   Undermining:  None Skin repair:    Repair method:  Sutures   Suture size:  5-0   Suture material:  Prolene   Suture technique:  Simple interrupted   Number of sutures:  5 Approximation:    Approximation:  Close Repair type:    Repair type:  Simple Post-procedure details:    Dressing:  Non-adherent dressing (pressure dressing)   Procedure completion:  Tolerated well, no immediate complications     Medications Ordered in ED Medications  lidocaine (PF) (XYLOCAINE) 1 % injection 5 mL (has no administration in  time range)  acetaminophen (TYLENOL) tablet 500 mg (has no administration in time range)    ED Course/ Medical Decision Making/ A&P                                 Medical Decision Making Risk OTC drugs. Prescription drug management.   57 year old female presents today for evaluation of laceration on left thumb.  This was repaired with 5 stitches. 3 Centimeter in length.  Return precaution discussed.  Patient voices understanding and is in agreement with plan.  Pressure dressing applied.  Advised patient to remove this in 3 hours.  She is in agreement.  Dressing supplies given.   Final Clinical Impression(s) / ED Diagnoses Final diagnoses:  Laceration of left thumb, foreign body presence unspecified, nail damage status unspecified, initial encounter    Rx / DC Orders ED Discharge Orders          Ordered    cephALEXin (KEFLEX) 500 MG capsule  3 times daily       Note to Pharmacy: Tolerated rocephin without issue.   06/10/23 1719              Marita Kansas, PA-C 06/10/23 1730    Virgina Norfolk, DO 06/10/23 2232

## 2023-06-10 NOTE — ED Notes (Signed)

## 2023-06-10 NOTE — Patient Outreach (Signed)
Attempted to contact patient regarding care gaps. Left voicemail for patient to return my call at [phone].  Nicholes Rough, CMA Care Guide [employer]

## 2023-06-10 NOTE — Discharge Instructions (Signed)
Laceration was repaired with 5 stitches.  These will need to be removed in 7 days.  You can return to the emergency department or go to your primary care doctor.  If you notice any signs of infection such as swelling, purulent drainage, fever without other source return to the emergency room.  I have sent Keflex to the pharmacy for you.  For any concerning symptoms return to the emergency room.

## 2023-06-10 NOTE — ED Triage Notes (Signed)
Left thumb laceration using a kitchen knife today . , pressure dressing  applied in triage , bleeding is controlled .  Tetanus vaccine within 1 year .

## 2023-06-11 ENCOUNTER — Encounter: Payer: 59 | Admitting: Physical Therapy

## 2023-06-17 ENCOUNTER — Emergency Department (HOSPITAL_BASED_OUTPATIENT_CLINIC_OR_DEPARTMENT_OTHER)
Admission: EM | Admit: 2023-06-17 | Discharge: 2023-06-17 | Disposition: A | Discharge status: Home / Self Care | Payer: Worker's Compensation | Attending: Emergency Medicine | Admitting: Emergency Medicine

## 2023-06-17 ENCOUNTER — Ambulatory Visit: Payer: 59 | Attending: Sports Medicine

## 2023-06-17 ENCOUNTER — Encounter (HOSPITAL_BASED_OUTPATIENT_CLINIC_OR_DEPARTMENT_OTHER): Payer: Self-pay | Admitting: Urology

## 2023-06-17 DIAGNOSIS — M542 Cervicalgia: Secondary | ICD-10-CM | POA: Diagnosis not present

## 2023-06-17 DIAGNOSIS — Z4802 Encounter for removal of sutures: Secondary | ICD-10-CM | POA: Diagnosis present

## 2023-06-17 DIAGNOSIS — M5412 Radiculopathy, cervical region: Secondary | ICD-10-CM | POA: Insufficient documentation

## 2023-06-17 DIAGNOSIS — R252 Cramp and spasm: Secondary | ICD-10-CM | POA: Insufficient documentation
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
## 2023-06-17 NOTE — ED Triage Notes (Signed)
Pt here for suture removal, sutures placed on 11/25, wound well approximate, no signs of infection

## 2023-06-17 NOTE — Discharge Instructions (Addendum)
Please follow-up with the hand surgery office listed below regarding your thumb numbness.  Continue to keep the laceration area clean with soap and water.  Return the ER if you develop any signs of infection such as redness or pus drainage out of your laceration site, any other new or concerning symptoms.

## 2023-06-17 NOTE — ED Notes (Signed)
Discharge instructions reviewed with patient. Patient verbalizes understanding, no further questions at this time. Follow up information provided. No acute distress noted at time of departure.

## 2023-06-17 NOTE — ED Provider Notes (Signed)
Cheryl [facility] AT [facility] Provider Note   CSN: [id number] Arrival date & time: 06/17/23  1106     History  Chief Complaint  Patient presents with   Suture / Staple Removal    Cheryl Thompson is a 57 y.o. female presents requesting suture removal from her left thumb.  She had sutures placed on 06/10/2023.  Denies any redness or pus drainage from the area.  Reports some numbness at the tip of her left thumb.  Denies any difficulty with moving her thumb.   Suture / Staple Removal       Home Medications Prior to Admission medications   Medication Sig Start Date End Date Taking? Authorizing Provider  albuterol (VENTOLIN HFA) 108 (90 Base) MCG/ACT inhaler Inhale 2 puffs into the lungs every 6 (six) hours as needed for wheezing or shortness of breath. 12/07/22   Paseda, Baird Kay, FNP  Azelastine-Fluticasone 137-50 MCG/ACT SUSP Place 1 spray into the nose in the morning and at bedtime. 11/20/22   Ferol Luz, MD  BIKTARVY 50-200-25 MG TABS tablet TAKE ONE TABLET BY MOUTH DAILY (PATIENT NEEDS APPOINTMENT) 06/03/23   Comer, Belia Heman, MD  cephALEXin (KEFLEX) 500 MG capsule Take 1 capsule (500 mg total) by mouth 3 (three) times daily for 7 days. 06/10/23 06/17/23  Marita Kansas, PA-C  cetirizine (ZYRTEC) 10 MG tablet Take 1 tablet (10 mg total) by mouth daily. 05/10/23   Paseda, Baird Kay, FNP  cyclobenzaprine (FLEXERIL) 5 MG tablet Take 1 tablet (5 mg total) by mouth at bedtime as needed for muscle spasms. 05/10/23   Donell Beers, FNP  DULoxetine (CYMBALTA) 60 MG capsule TAKE 1 CAPSULE BY MOUTH DAILY 06/03/23   Thresa Ross, MD  ezetimibe (ZETIA) 10 MG tablet Take 1 tablet (10 mg total) by mouth daily. 09/11/22   Paseda, Baird Kay, FNP  fluticasone (FLONASE) 50 MCG/ACT nasal spray Place 2 sprays into both nostrils daily. 09/10/22   Paseda, Baird Kay, FNP  hydrochlorothiazide (HYDRODIURIL) 25 MG tablet TAKE 1 TABLET BY MOUTH DAILY FOR HIGH BLOOD  PRESSURE 04/29/23   Paseda, Baird Kay, FNP  hydrOXYzine (ATARAX) 25 MG tablet Take 1 tablet (25 mg total) by mouth daily as needed for anxiety. 07/02/22   Paseda, Baird Kay, FNP  hyoscyamine (LEVSIN SL) 0.125 MG SL tablet Place 1 tablet (0.125 mg total) under the tongue every 4 (four) hours as needed. 10/16/21   Massie Maroon, FNP  icosapent Ethyl (VASCEPA) 1 g capsule Take 2 capsules (2 g total) by mouth 2 (two) times daily. 11/01/22   Paseda, Baird Kay, FNP  ipratropium (ATROVENT) 0.06 % nasal spray Place 2 sprays into both nostrils 4 (four) times daily. 11/20/22   Ferol Luz, MD  levETIRAcetam (KEPPRA) 500 MG tablet TAKE 1 TABLET(500 MG) BY MOUTH TWICE DAILY Patient not taking: Reported on 05/17/2023 09/07/20   Massie Maroon, FNP  levothyroxine (SYNTHROID) 112 MCG tablet TAKE ONE TABLET BY MOUTH DAILY AT 8AM BEFORE BREAKFAST 06/10/23   Paseda, Baird Kay, FNP  metFORMIN (GLUCOPHAGE) 500 MG tablet TAKE 1 TABLET BY MOUTH TWICE  DAILY WITH A MEAL 03/06/23   Paseda, Baird Kay, FNP  montelukast (SINGULAIR) 10 MG tablet Take 1 tablet (10 mg total) by mouth at bedtime. 04/25/23   Ferol Luz, MD  pregabalin (LYRICA) 25 MG capsule Take 1 capsule by mouth twice daily 12/04/22   Thresa Ross, MD  QUEtiapine (SEROQUEL) 100 MG tablet TAKE 1 TABLET BY MOUTH AT  BEDTIME 05/07/23  Thresa Ross, MD      Allergies    Hydrocodone, Penicillins, Naproxen, Codeine, Doxycycline, Morphine and codeine, and Statins    Review of Systems   Review of Systems  Skin:  Positive for wound.    Physical Exam Updated Vital Signs BP (!) 169/96   Pulse (!) 103   Temp 98.6 F (37 C) (Oral)   Resp 16   Ht 5\' 8"  (1.727 m)   Wt 101.6 kg   SpO2 99%   BMI 34.06 kg/m  Physical Exam Vitals and nursing note reviewed.  Constitutional:      Appearance: Normal appearance.  HENT:     Head: Atraumatic.  Pulmonary:     Effort: Pulmonary effort is normal.  Musculoskeletal:     Comments: Able to  fully flex and extend at the left thumb MCP, PIP.  Skin:    Comments: Well-healed laceration to the palmar aspect of the left thumb over the proximal phalanx.  No erythema or pus drainage.  Neurological:     General: No focal deficit present.     Mental Status: She is alert.     Comments: Decrease sensation to the distal aspect of the left thumb  Psychiatric:        Mood and Affect: Mood normal.        Behavior: Behavior normal.     ED Results / Procedures / Treatments   Labs (all labs ordered are listed, but only abnormal results are displayed) Labs Reviewed - No data to display  EKG None  Radiology No results found.  Procedures Suture Removal  Date/Time: 06/17/2023 11:29 AM  Performed by: Arabella Merles, PA-C Authorized by: Arabella Merles, PA-C   Consent:    Consent obtained:  Verbal   Consent given by:  Patient   Risks, benefits, and alternatives were discussed: yes     Risks discussed:  Bleeding   Alternatives discussed:  No treatment Universal protocol:    Procedure explained and questions answered to patient or proxy's satisfaction: yes     Patient identity confirmed:  Verbally with patient Location:    Location:  Upper extremity   Upper extremity location:  Hand   Hand location:  L thumb Procedure details:    Wound appearance:  No signs of infection, nonpurulent, good wound healing and clean   Number of sutures removed:  5 Post-procedure details:    Post-removal:  Dressing applied   Procedure completion:  Tolerated well, no immediate complications     Medications Ordered in ED Medications - No data to display  ED Course/ Medical Decision Making/ A&P                                 Medical Decision Making    Differential diagnosis includes but is not limited to suture removal, tendon injury, nerve injury  ED Course:  Patient's left thumb laceration well-healed, no signs of infection.  5 sutures removed without any complication.  Patient  with full range of motion of the left thumb.  She reports decreased sensation to the tip of her left thumb, but is able to still feel my touch to her distal left thumb.  I discussed following up with hand surgery regarding this.  Provided information for the office and discharge paperwork.   Impression: Suture removal  Disposition:  The patient was discharged home with instructions to follow-up with hand surgery regarding her left thumb numbness.  Continue  to keep the laceration clean with soap and water. Return precautions given.   External records from outside source obtained and reviewed including ER note from 06/10/2023 where she had 5 sutures placed in the left thumb              Final Clinical Impression(s) / ED Diagnoses Final diagnoses:  Visit for suture removal    Rx / DC Orders ED Discharge Orders     None         Arabella Merles, PA-C 06/17/23 1130    Long, Arlyss Repress, MD 06/18/23 [phone]

## 2023-06-20 ENCOUNTER — Ambulatory Visit: Payer: 59 | Admitting: Physical Therapy

## 2023-06-24 ENCOUNTER — Ambulatory Visit: Payer: 59

## 2023-06-24 DIAGNOSIS — M546 Pain in thoracic spine: Secondary | ICD-10-CM | POA: Diagnosis not present

## 2023-06-24 DIAGNOSIS — M542 Cervicalgia: Secondary | ICD-10-CM | POA: Diagnosis not present

## 2023-06-24 DIAGNOSIS — F17210 Nicotine dependence, cigarettes, uncomplicated: Secondary | ICD-10-CM | POA: Diagnosis not present

## 2023-06-24 DIAGNOSIS — R9431 Abnormal electrocardiogram [ECG] [EKG]: Secondary | ICD-10-CM | POA: Diagnosis not present

## 2023-06-24 DIAGNOSIS — Y92410 Unspecified street and highway as the place of occurrence of the external cause: Secondary | ICD-10-CM | POA: Diagnosis not present

## 2023-06-24 DIAGNOSIS — D649 Anemia, unspecified: Secondary | ICD-10-CM | POA: Diagnosis not present

## 2023-06-24 DIAGNOSIS — S299XXA Unspecified injury of thorax, initial encounter: Secondary | ICD-10-CM | POA: Diagnosis not present

## 2023-06-24 DIAGNOSIS — I10 Essential (primary) hypertension: Secondary | ICD-10-CM | POA: Diagnosis not present

## 2023-06-24 DIAGNOSIS — I452 Bifascicular block: Secondary | ICD-10-CM | POA: Diagnosis not present

## 2023-06-24 DIAGNOSIS — D696 Thrombocytopenia, unspecified: Secondary | ICD-10-CM | POA: Diagnosis not present

## 2023-06-24 DIAGNOSIS — S0990XA Unspecified injury of head, initial encounter: Secondary | ICD-10-CM | POA: Diagnosis not present

## 2023-06-24 DIAGNOSIS — S161XXA Strain of muscle, fascia and tendon at neck level, initial encounter: Secondary | ICD-10-CM | POA: Diagnosis not present

## 2023-06-24 DIAGNOSIS — Z885 Allergy status to narcotic agent status: Secondary | ICD-10-CM | POA: Diagnosis not present

## 2023-06-24 DIAGNOSIS — I4510 Unspecified right bundle-branch block: Secondary | ICD-10-CM | POA: Diagnosis not present

## 2023-06-27 ENCOUNTER — Ambulatory Visit: Payer: 59 | Admitting: Physical Therapy

## 2023-07-01 ENCOUNTER — Other Ambulatory Visit (HOSPITAL_COMMUNITY): Payer: Self-pay | Admitting: Psychiatry

## 2023-07-01 DIAGNOSIS — F332 Major depressive disorder, recurrent severe without psychotic features: Secondary | ICD-10-CM

## 2023-07-05 ENCOUNTER — Other Ambulatory Visit: Payer: Self-pay | Admitting: Nurse Practitioner

## 2023-07-05 ENCOUNTER — Other Ambulatory Visit: Payer: Self-pay | Admitting: Internal Medicine

## 2023-07-05 DIAGNOSIS — E1165 Type 2 diabetes mellitus with hyperglycemia: Secondary | ICD-10-CM

## 2023-07-05 DIAGNOSIS — Z21 Asymptomatic human immunodeficiency virus [HIV] infection status: Secondary | ICD-10-CM

## 2023-07-05 NOTE — Telephone Encounter (Signed)
Pt has appt 12/23. Recently refilled rx.

## 2023-07-08 ENCOUNTER — Encounter: Payer: Self-pay | Admitting: Family Medicine

## 2023-07-08 ENCOUNTER — Ambulatory Visit: Payer: 59 | Admitting: Internal Medicine

## 2023-07-08 ENCOUNTER — Other Ambulatory Visit: Payer: Self-pay

## 2023-07-08 ENCOUNTER — Ambulatory Visit (INDEPENDENT_AMBULATORY_CARE_PROVIDER_SITE_OTHER): Payer: 59 | Admitting: Family Medicine

## 2023-07-08 VITALS — BP 150/88 | Ht 68.0 in | Wt 223.0 lb

## 2023-07-08 DIAGNOSIS — M542 Cervicalgia: Secondary | ICD-10-CM

## 2023-07-08 MED ORDER — METHOCARBAMOL 750 MG PO TABS: 750.0000 mg | ORAL_TABLET | Freq: Four times a day (QID) | ORAL | 0 refills | Status: AC
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
## 2023-07-19 ENCOUNTER — Ambulatory Visit: Payer: Self-pay | Admitting: Nurse Practitioner

## 2023-07-25 ENCOUNTER — Ambulatory Visit (INDEPENDENT_AMBULATORY_CARE_PROVIDER_SITE_OTHER): Payer: 59 | Admitting: Internal Medicine

## 2023-07-25 ENCOUNTER — Encounter: Payer: Self-pay | Admitting: Internal Medicine

## 2023-07-25 ENCOUNTER — Other Ambulatory Visit: Payer: Self-pay

## 2023-07-25 VITALS — BP 166/108 | HR 78 | Temp 97.9°F | Ht 68.0 in | Wt 238.0 lb

## 2023-07-25 DIAGNOSIS — I10 Essential (primary) hypertension: Secondary | ICD-10-CM | POA: Diagnosis not present

## 2023-07-25 DIAGNOSIS — Z23 Encounter for immunization: Secondary | ICD-10-CM | POA: Diagnosis not present

## 2023-07-25 DIAGNOSIS — Z21 Asymptomatic human immunodeficiency virus [HIV] infection status: Secondary | ICD-10-CM | POA: Diagnosis not present

## 2023-07-25 DIAGNOSIS — K7460 Unspecified cirrhosis of liver: Secondary | ICD-10-CM | POA: Diagnosis not present

## 2023-07-25 NOTE — Assessment & Plan Note (Signed)
 Fibrosis noted on previous cystography of F3-F4 as well as some low platelets therefore we will check her screening ultrasound for hepatocellular carcinoma screening.

## 2023-07-25 NOTE — Assessment & Plan Note (Signed)
 She is doing well on Biktarvy  and I did emphasize that she can take Biktarvy  with ibuprofen  and in the future if she is unsure about interactions that she should call first and discuss it with us  prior to stopping.  I did emphasize the need to stay on the medication without stopping.  She voiced her understanding.  Will check her labs today and otherwise she can return in 6 months.
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
## 2023-07-25 NOTE — Assessment & Plan Note (Signed)
 Blood pressure significantly elevated and told her to continue to monitor it and follow-up with her primary care doctor.

## 2023-07-28 LAB — HIV-1 RNA QUANT-NO REFLEX-BLD
HIV 1 RNA Quant: NOT DETECTED {copies}/mL
HIV-1 RNA Quant, Log: NOT DETECTED {Log}

## 2023-07-28 LAB — T-HELPER CELLS (CD4) COUNT (NOT AT ARMC)
Absolute CD4: 1237 {cells}/uL (ref 490–1740)
CD4 T Helper %: 47 % (ref 30–61)
Total lymphocyte count: 2625 {cells}/uL (ref 850–3900)

## 2023-07-29 ENCOUNTER — Other Ambulatory Visit: Payer: 59

## 2023-07-30 ENCOUNTER — Other Ambulatory Visit: Payer: Self-pay | Admitting: Family Medicine

## 2023-07-30 ENCOUNTER — Telehealth (HOSPITAL_COMMUNITY): Payer: Self-pay | Admitting: *Deleted

## 2023-07-30 ENCOUNTER — Other Ambulatory Visit: Payer: Self-pay | Admitting: Nurse Practitioner

## 2023-07-30 DIAGNOSIS — F32A Depression, unspecified: Secondary | ICD-10-CM

## 2023-07-30 DIAGNOSIS — M542 Cervicalgia: Secondary | ICD-10-CM

## 2023-07-30 MED ORDER — PREGABALIN 25 MG PO CAPS: ORAL_CAPSULE | ORAL | 0 refills | Status: DC

## 2023-07-30 NOTE — Addendum Note (Signed)
 Addended by: Thresa Ross on: 07/30/2023 11:31 AM   Modules accepted: Orders

## 2023-07-30 NOTE — Telephone Encounter (Signed)
 Spoke with patient she will be out after taking night time dose  --- she will need a AM dose taken with breakfast

## 2023-07-30 NOTE — Telephone Encounter (Signed)
 PATIENT WILL BE @ 9:30 AM APPT 07/31/23

## 2023-07-30 NOTE — Telephone Encounter (Signed)
 Refill Request [facility] [place], [place] - [address]   pregabalin (LYRICA) 25 MG capsule   Next Appt 07/31/23 Last Appt  05/01/23

## 2023-07-30 NOTE — Progress Notes (Signed)
 Cervical spine PT referral placed

## 2023-07-31 ENCOUNTER — Telehealth (HOSPITAL_COMMUNITY): Payer: 59 | Admitting: Psychiatry

## 2023-07-31 ENCOUNTER — Ambulatory Visit (INDEPENDENT_AMBULATORY_CARE_PROVIDER_SITE_OTHER): Payer: 59 | Admitting: Family Medicine

## 2023-07-31 ENCOUNTER — Encounter: Payer: Self-pay | Admitting: Family Medicine

## 2023-07-31 ENCOUNTER — Encounter (HOSPITAL_COMMUNITY): Payer: Self-pay | Admitting: Psychiatry

## 2023-07-31 VITALS — BP 160/90 | Ht 68.0 in | Wt 238.0 lb

## 2023-07-31 DIAGNOSIS — F3163 Bipolar disorder, current episode mixed, severe, without psychotic features: Secondary | ICD-10-CM

## 2023-07-31 DIAGNOSIS — R202 Paresthesia of skin: Secondary | ICD-10-CM | POA: Diagnosis not present

## 2023-07-31 DIAGNOSIS — F411 Generalized anxiety disorder: Secondary | ICD-10-CM

## 2023-07-31 DIAGNOSIS — G629 Polyneuropathy, unspecified: Secondary | ICD-10-CM | POA: Diagnosis not present

## 2023-07-31 DIAGNOSIS — M542 Cervicalgia: Secondary | ICD-10-CM

## 2023-07-31 DIAGNOSIS — R200 Anesthesia of skin: Secondary | ICD-10-CM

## 2023-07-31 DIAGNOSIS — F332 Major depressive disorder, recurrent severe without psychotic features: Secondary | ICD-10-CM

## 2023-07-31 DIAGNOSIS — I10 Essential (primary) hypertension: Secondary | ICD-10-CM | POA: Diagnosis not present

## 2023-07-31 MED ORDER — METHYLPREDNISOLONE 4 MG PO TBPK: ORAL_TABLET | ORAL | 0 refills | Status: DC

## 2023-07-31 MED ORDER — KETOROLAC TROMETHAMINE 60 MG/2ML IM SOLN
60.0000 mg | Freq: Once | INTRAMUSCULAR | Status: AC
  Administered 2023-07-31: 60 mg via INTRAMUSCULAR
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
## 2023-08-01 ENCOUNTER — Other Ambulatory Visit: Payer: 59

## 2023-08-01 DIAGNOSIS — Z888 Allergy status to other drugs, medicaments and biological substances status: Secondary | ICD-10-CM | POA: Diagnosis not present

## 2023-08-01 DIAGNOSIS — Z8673 Personal history of transient ischemic attack (TIA), and cerebral infarction without residual deficits: Secondary | ICD-10-CM | POA: Diagnosis not present

## 2023-08-01 DIAGNOSIS — Z79899 Other long term (current) drug therapy: Secondary | ICD-10-CM | POA: Diagnosis not present

## 2023-08-01 DIAGNOSIS — R569 Unspecified convulsions: Secondary | ICD-10-CM | POA: Diagnosis not present

## 2023-08-01 DIAGNOSIS — M1990 Unspecified osteoarthritis, unspecified site: Secondary | ICD-10-CM | POA: Diagnosis not present

## 2023-08-01 DIAGNOSIS — R531 Weakness: Secondary | ICD-10-CM | POA: Diagnosis not present

## 2023-08-01 DIAGNOSIS — E785 Hyperlipidemia, unspecified: Secondary | ICD-10-CM | POA: Diagnosis not present

## 2023-08-01 DIAGNOSIS — Z87892 Personal history of anaphylaxis: Secondary | ICD-10-CM | POA: Diagnosis not present

## 2023-08-01 DIAGNOSIS — R9431 Abnormal electrocardiogram [ECG] [EKG]: Secondary | ICD-10-CM | POA: Diagnosis not present

## 2023-08-01 DIAGNOSIS — R42 Dizziness and giddiness: Secondary | ICD-10-CM | POA: Diagnosis not present

## 2023-08-01 DIAGNOSIS — R519 Headache, unspecified: Secondary | ICD-10-CM | POA: Diagnosis not present

## 2023-08-01 DIAGNOSIS — R200 Anesthesia of skin: Secondary | ICD-10-CM | POA: Diagnosis not present

## 2023-08-01 DIAGNOSIS — Z880 Allergy status to penicillin: Secondary | ICD-10-CM | POA: Diagnosis not present

## 2023-08-01 DIAGNOSIS — Z883 Allergy status to other anti-infective agents status: Secondary | ICD-10-CM | POA: Diagnosis not present

## 2023-08-01 DIAGNOSIS — Z885 Allergy status to narcotic agent status: Secondary | ICD-10-CM | POA: Diagnosis not present

## 2023-08-01 DIAGNOSIS — F17210 Nicotine dependence, cigarettes, uncomplicated: Secondary | ICD-10-CM | POA: Diagnosis not present

## 2023-08-01 DIAGNOSIS — Z7984 Long term (current) use of oral hypoglycemic drugs: Secondary | ICD-10-CM | POA: Diagnosis not present

## 2023-08-01 DIAGNOSIS — I4510 Unspecified right bundle-branch block: Secondary | ICD-10-CM | POA: Diagnosis not present

## 2023-08-01 DIAGNOSIS — I10 Essential (primary) hypertension: Secondary | ICD-10-CM | POA: Diagnosis not present

## 2023-08-01 DIAGNOSIS — Z886 Allergy status to analgesic agent status: Secondary | ICD-10-CM | POA: Diagnosis not present

## 2023-08-01 DIAGNOSIS — G8929 Other chronic pain: Secondary | ICD-10-CM | POA: Diagnosis not present

## 2023-08-01 DIAGNOSIS — R202 Paresthesia of skin: Secondary | ICD-10-CM | POA: Diagnosis not present

## 2023-08-01 DIAGNOSIS — M542 Cervicalgia: Secondary | ICD-10-CM | POA: Diagnosis not present

## 2023-08-02 ENCOUNTER — Telehealth: Payer: Self-pay

## 2023-08-02 NOTE — Transitions of Care (Post Inpatient/ED Visit) (Signed)
08/02/2023  Name: Cheryl Thompson MRN: [id number] DOB: 11-May-1966  Today's TOC FU Call Status: Today's TOC FU Call Status:: Successful TOC FU Call Completed TOC FU Call Complete Date: 08/02/23 Patient's Name and Date of Birth confirmed.  Transition Care Management Follow-up Telephone Call Date of Discharge: 08/01/23 Discharge Facility: Other ([facility]) Name of [facility] ([facility]charge Facility: [facility] Type of Discharge: Emergency Department Reason for ED Visit: Other: (hypertension) How have you been since you were released from the hospital?: Same Any questions or concerns?: Yes  Items Reviewed: Did you receive and understand the discharge instructions provided?: Yes Medications obtained,verified, and reconciled?: Yes (Medications Reviewed) Any new allergies since your discharge?: No Dietary orders reviewed?: Yes Do you have support at home?: No  Medications Reviewed Today: Medications Reviewed Today     Reviewed by Karena Addison, LPN (Licensed Practical Nurse) on 08/02/23 at 1109  Med List Status: <None>   Medication Order Taking? Sig Documenting Provider Last Dose Status Informant  albuterol (VENTOLIN HFA) 108 (90 Base) MCG/ACT inhaler [id number] No Inhale 2 puffs into the lungs every 6 (six) hours as needed for wheezing or shortness of breath. Donell Beers, FNP Taking Active   Azelastine-Fluticasone 137-50 MCG/ACT SUSP [id number] No Place 1 spray into the nose in the morning and at bedtime. Ferol Luz, MD Taking Active   BIKTARVY 50-200-25 MG TABS tablet [id number] No TAKE ONE TABLET BY MOUTH DAILY (PATIENT NEEDS APPOINTMENT) Comer, Belia Heman, MD Taking Active   cetirizine (ZYRTEC) 10 MG tablet [id number] No Take 1 tablet (10 mg total) by mouth daily. Donell Beers, FNP Taking Active   cyclobenzaprine (FLEXERIL) 5 MG tablet [id number] No Take 1 tablet (5 mg total) by mouth at bedtime as needed for muscle spasms. Donell Beers, FNP  Taking Active   DULoxetine (CYMBALTA) 60 MG capsule [id number] No TAKE 1 CAPSULE BY MOUTH DAILY Thresa Ross, MD Taking Active   ezetimibe (ZETIA) 10 MG tablet [id number] No Take 1 tablet (10 mg total) by mouth daily. Donell Beers, FNP Taking Active   fluticasone (FLONASE) 50 MCG/ACT nasal spray [id number] No Place 2 sprays into both nostrils daily. Donell Beers, FNP Taking Active   hydrochlorothiazide (HYDRODIURIL) 25 MG tablet [id number] No TAKE 1 TABLET BY MOUTH DAILY FOR HIGH BLOOD PRESSURE Paseda, Baird Kay, FNP Taking Active   hydrOXYzine (ATARAX) 25 MG tablet [id number]  TAKE 1 TABLET BY MOUTH ONCE DAILY AS NEEDED FOR ANXIETY Paseda, Baird Kay, FNP  Active   hyoscyamine (LEVSIN SL) 0.125 MG SL tablet [id number] No Place 1 tablet (0.125 mg total) under the tongue every 4 (four) hours as needed. Massie Maroon, FNP Taking Active Self           Med Note Raj Janus   Mon Sep 10, 2022 10:32 AM) 100mg   icosapent Ethyl (VASCEPA) 1 g capsule [id number] No Take 2 capsules (2 g total) by mouth 2 (two) times daily. Donell Beers, FNP Taking Active   ipratropium (ATROVENT) 0.06 % nasal spray [id number] No Place 2 sprays into both nostrils 4 (four) times daily. Ferol Luz, MD Taking Active   levETIRAcetam (KEPPRA) 500 MG tablet [id number] No TAKE 1 TABLET(500 MG) BY MOUTH TWICE DAILY  Patient not taking: Reported on 05/17/2023   Massie Maroon, FNP Not Taking Active Self           Med Note Raj Janus   Mon Sep 10, 2022 10:31 AM) Pt take it one a day.  levothyroxine (SYNTHROID)  112 MCG tablet [id number] No TAKE ONE TABLET BY MOUTH DAILY AT 8AM BEFORE BREAKFAST Donell Beers, FNP Taking Active   metFORMIN (GLUCOPHAGE) 500 MG tablet [id number] No TAKE ONE TABLET BY MOUTH TWICE DAILY @9AM  5PM WITH MEALS Ivonne Andrew, NP Taking Active   montelukast (SINGULAIR) 10 MG tablet [id number] No Take 1 tablet (10 mg total) by mouth at bedtime.  Ferol Luz, MD Taking Active   pregabalin (LYRICA) 25 MG capsule [id number]  Take 1 capsule by mouth twice daily Thresa Ross, MD  Active   QUEtiapine (SEROQUEL) 100 MG tablet [id number] No TAKE ONE TABLET BY MOUTH DAILY AT 9PM AT BEDTIME Thresa Ross, MD Taking Active             Home Care and Equipment/Supplies: Were Home Health Services Ordered?: NA Any new equipment or medical supplies ordered?: NA  Functional Questionnaire: Do you need assistance with bathing/showering or dressing?: No Do you need assistance with meal preparation?: No Do you need assistance with eating?: No Do you have difficulty maintaining continence: No Do you need assistance with getting out of bed/getting out of a chair/moving?: No Do you have difficulty managing or taking your medications?: No  Follow up appointments reviewed: PCP Follow-up appointment confirmed?: Yes Date of PCP follow-up appointment?: 08/05/23 Follow-up Provider: [facility] Follow-up appointment confirmed?: NA Do you need transportation to your follow-up appointment?: No Do you understand care options if your condition(s) worsen?: Yes-patient verbalized understanding    SIGNATURE Karena Addison, LPN [facility] Nurse Health Advisor Direct Dial [phone]

## 2023-08-03 ENCOUNTER — Ambulatory Visit
Admission: RE | Admit: 2023-08-03 | Discharge: 2023-08-03 | Disposition: A | Discharge status: Home / Self Care | Payer: 59 | Source: Ambulatory Visit | Attending: Family Medicine | Admitting: Family Medicine

## 2023-08-03 DIAGNOSIS — M542 Cervicalgia: Secondary | ICD-10-CM

## 2023-08-03 DIAGNOSIS — M5412 Radiculopathy, cervical region: Secondary | ICD-10-CM | POA: Diagnosis not present

## 2023-08-05 ENCOUNTER — Encounter: Payer: Self-pay | Admitting: Nurse Practitioner

## 2023-08-05 ENCOUNTER — Ambulatory Visit (INDEPENDENT_AMBULATORY_CARE_PROVIDER_SITE_OTHER): Payer: 59 | Admitting: Nurse Practitioner

## 2023-08-05 VITALS — BP 131/79 | HR 93 | Temp 97.5°F | Wt 234.0 lb

## 2023-08-05 DIAGNOSIS — D649 Anemia, unspecified: Secondary | ICD-10-CM | POA: Diagnosis not present

## 2023-08-05 DIAGNOSIS — Z09 Encounter for follow-up examination after completed treatment for conditions other than malignant neoplasm: Secondary | ICD-10-CM | POA: Insufficient documentation

## 2023-08-05 DIAGNOSIS — F17200 Nicotine dependence, unspecified, uncomplicated: Secondary | ICD-10-CM | POA: Diagnosis not present

## 2023-08-05 DIAGNOSIS — M5416 Radiculopathy, lumbar region: Secondary | ICD-10-CM

## 2023-08-05 DIAGNOSIS — I10 Essential (primary) hypertension: Secondary | ICD-10-CM | POA: Diagnosis not present

## 2023-08-05 DIAGNOSIS — E876 Hypokalemia: Secondary | ICD-10-CM | POA: Diagnosis not present

## 2023-08-05 DIAGNOSIS — E039 Hypothyroidism, unspecified: Secondary | ICD-10-CM

## 2023-08-05 DIAGNOSIS — E1165 Type 2 diabetes mellitus with hyperglycemia: Secondary | ICD-10-CM

## 2023-08-05 LAB — POCT GLYCOSYLATED HEMOGLOBIN (HGB A1C): Hemoglobin A1C: 6.3 % — AB (ref 4.0–5.6)

## 2023-08-05 MED ORDER — AMLODIPINE BESYLATE 5 MG PO TABS: 5.0000 mg | ORAL_TABLET | Freq: Every day | ORAL | 0 refills | Status: DC
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
## 2023-08-05 NOTE — Assessment & Plan Note (Addendum)
She has cut down to 2 cigarttes daily . Does not like patches and gum Patient congratulated on her efforts at smoking cessation.  Complete cessation encouraged

## 2023-08-05 NOTE — Assessment & Plan Note (Signed)
Hospital chart reviewed, including discharge summary Medications reconciled and reviewed with the patient in detail 

## 2023-08-05 NOTE — Patient Instructions (Signed)
Around 3 times per week, check your blood pressure 2 times per day. once in the morning and once in the evening. The readings should be at least one minute apart. Write down these values and bring them to your next nurse visit/appointment.  When you check your BP, make sure you have been doing something calm/relaxing 5 minutes prior to checking. Both feet should be flat on the floor and you should be sitting. Use your left arm and make sure it is in a relaxed position (on a table), and that the cuff is at the approximate level/height of your heart.   1. Tobacco dependence (Primary)   2. Anemia, unspecified type  - CBC - Iron, TIBC and Ferritin Panel - B12 and Folate Panel  3. Type 2 diabetes mellitus with hyperglycemia, without long-term current use of insulin (HCC)  - POCT glycosylated hemoglobin (Hb A1C)  4. Essential hypertension, benign  - CMP14+EGFR - amLODipine (NORVASC) 5 MG tablet; Take 1 tablet (5 mg total) by mouth daily.  Dispense: 90 tablet; Refill: 0  5. Lumbar radiculopathy    It is important that you exercise regularly at least 30 minutes 5 times a week as tolerated  Think about what you will eat, plan ahead. Choose " clean, green, fresh or frozen" over canned, processed or packaged foods which are more sugary, salty and fatty. 70 to 75% of food eaten should be vegetables and fruit. Three meals at set times with snacks allowed between meals, but they must be fruit or vegetables. Aim to eat over a 12 hour period , example 7 am to 7 pm, and STOP after  your last meal of the day. Drink water,generally about 64 ounces per day, no other drink is as healthy. Fruit juice is best enjoyed in a healthy way, by EATING the fruit.  Thanks for choosing [facility] we consider it a privelige to serve you.

## 2023-08-05 NOTE — Assessment & Plan Note (Addendum)
Lab Results  Component Value Date   TSH 5.740 (H) 04/22/2023  Continue levothyroxine 112 mcg daily, Recent TSH level at the emergency room was 11.08 High , normal T4 We will check TSH T4 levels at next visit

## 2023-08-05 NOTE — Assessment & Plan Note (Signed)
Prescription for amlodipine 5 mg sent to the pharmacy, continue hydrochlorothiazide 25 mg daily Encouraged to monitor blood pressure at home, blood pressure goal is less than 130/80 Follow-up in 4 weeks DASH diet and commitment to daily physical activity for a minimum of 30 minutes discussed and encouraged, as a part of hypertension management.      08/05/2023    2:34 PM 08/05/2023    2:22 PM 07/31/2023    4:16 PM 07/31/2023    3:35 PM 07/25/2023    9:48 AM 07/08/2023    4:03 PM 07/08/2023    3:10 PM  BP/Weight  Systolic BP 131 147 160 158 166 150 160  Diastolic BP 79 84 90 80 108 88 94  Wt. (Lbs)  234  238 238  223  BMI  35.58 kg/m2  36.19 kg/m2 36.19 kg/m2  33.91 kg/m2

## 2023-08-05 NOTE — Assessment & Plan Note (Signed)
 Checking CMP.

## 2023-08-05 NOTE — Assessment & Plan Note (Signed)
CBC And Differential Order: [id number] Component Ref Range & Units 4 d ago  WBC 4.0 - 10.5 thou/mcL 6.9  RBC 3.93 - 5.22 million/mcL 3.63 Low   HGB 11.2 - 15.7 gm/dL 32.2 Low   HCT 02.5 - 42.7 % 32.4 Low   MCV 79.4 - 94.8 fL 89.3  MCH 25.6 - 32.2 pg 29.5  MCHC 32.2 - 35.5 gm/dL 06.2  Plt Ct 376 - 283 thou/mcL 121 Low   RDW SD 35.1 - 46.3 fL 45.4  MPV 9.4 - 12.4 fL 12.6 High   NRBC% 0.0 - 0.2 /100WBC 0.0  Absolute NRBC Count 0.00 - 0.01 thou/mcL 0.00  NEUTROPHIL % % 49.1  LYMPHOCYTE % % 39.3  MONOCYTE % % 7.4  Eosinophil % % 3.6  BASOPHIL % % 0.3  IG% % 0.3  ABSOLUTE NEUTROPHIL COUNT 1.50 - 7.50 thou/mcL 3.38  ABSOLUTE LYMPHOCYTE COUNT 1.00 - 4.50 thou/mcL 2.71  Absolute Monocyte Count 0.10 - 0.80 thou/mcL 0.51  Absolute Eosinophil Count 0.00 - 0.50 thou/mcL 0.25  Absolute Basophil Count 0.00 - 0.20 thou/mcL 0.02  Absolute Immature Granulocyte Count 0.00 - 0.03 thou/mcL 0.02   - Iron, TIBC and Ferritin Panel - B12 and Folate Panel

## 2023-08-05 NOTE — Assessment & Plan Note (Addendum)
Will increase Lyrica to 50 mg twice daily, currently on Lyrica 25 mg twice daily. Continue duloxetine 60 mg daily Encouraged the patient to keep her upcoming appointment with orthopedics  Recent MRI result below   IMPRESSION: 1. No recent traumatic injury is evident in the cervical spine. 2. Chronic lower cervical disc and endplate degeneration C5-C6 and C6-C7. Perhaps faint degenerative endplate marrow edema at the latter. But no associated spinal stenosis. Isolated neural foraminal stenosis at C6-C7, primarily on the left. Query Left C7 radiculitis.

## 2023-08-05 NOTE — Assessment & Plan Note (Signed)
Lab Results  Component Value Date   HGBA1C 6.3 (A) 08/05/2023  Continue metformin 500 mg twice daily Counseled on low-carb modified diet

## 2023-08-06 ENCOUNTER — Other Ambulatory Visit: Payer: Self-pay | Admitting: Nurse Practitioner

## 2023-08-06 ENCOUNTER — Other Ambulatory Visit: Payer: Self-pay | Admitting: Internal Medicine

## 2023-08-06 DIAGNOSIS — N179 Acute kidney failure, unspecified: Secondary | ICD-10-CM

## 2023-08-06 DIAGNOSIS — E039 Hypothyroidism, unspecified: Secondary | ICD-10-CM

## 2023-08-06 LAB — CMP14+EGFR
ALT: 13 [IU]/L (ref 0–32)
AST: 19 [IU]/L (ref 0–40)
Albumin: 4.2 g/dL (ref 3.8–4.9)
Alkaline Phosphatase: 79 [IU]/L (ref 44–121)
BUN/Creatinine Ratio: 17 (ref 9–23)
BUN: 23 mg/dL (ref 6–24)
Bilirubin Total: <0.2 mg/dL (ref 0.0–1.2)
CO2: 23 mmol/L (ref 20–29)
Calcium: 9.6 mg/dL (ref 8.7–10.2)
Chloride: 97 mmol/L (ref 96–106)
Creatinine, Ser: 1.35 mg/dL — ABNORMAL HIGH (ref 0.57–1.00)
Globulin, Total: 4 g/dL (ref 1.5–4.5)
Glucose: 111 mg/dL — ABNORMAL HIGH (ref 70–99)
Potassium: 3.9 mmol/L (ref 3.5–5.2)
Sodium: 136 mmol/L (ref 134–144)
Total Protein: 8.2 g/dL (ref 6.0–8.5)
eGFR: 46 mL/min/{1.73_m2} — ABNORMAL LOW (ref >59)

## 2023-08-06 LAB — B12 AND FOLATE PANEL
Folate: 3.7 ng/mL (ref >3.0)
Vitamin B-12: 376 pg/mL (ref 232–1245)

## 2023-08-06 LAB — IRON,TIBC AND FERRITIN PANEL
Ferritin: 19 ng/mL (ref 15–150)
Iron Saturation: 19 % (ref 15–55)
Iron: 88 ug/dL (ref 27–159)
Total Iron Binding Capacity: 453 ug/dL — ABNORMAL HIGH (ref 250–450)
UIBC: 365 ug/dL (ref 131–425)

## 2023-08-09 ENCOUNTER — Ambulatory Visit: Payer: Self-pay | Admitting: Nurse Practitioner
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
## 2023-08-12 ENCOUNTER — Other Ambulatory Visit: Payer: Self-pay

## 2023-08-12 ENCOUNTER — Encounter: Payer: Self-pay | Admitting: Physical Therapy

## 2023-08-12 ENCOUNTER — Ambulatory Visit: Payer: 59 | Attending: Family Medicine | Admitting: Physical Therapy

## 2023-08-12 DIAGNOSIS — M5412 Radiculopathy, cervical region: Secondary | ICD-10-CM | POA: Insufficient documentation

## 2023-08-12 DIAGNOSIS — M6281 Muscle weakness (generalized): Secondary | ICD-10-CM | POA: Insufficient documentation

## 2023-08-12 DIAGNOSIS — M542 Cervicalgia: Secondary | ICD-10-CM | POA: Diagnosis not present

## 2023-08-12 DIAGNOSIS — M62838 Other muscle spasm: Secondary | ICD-10-CM | POA: Diagnosis not present

## 2023-08-19 ENCOUNTER — Ambulatory Visit: Payer: 59 | Attending: Family Medicine

## 2023-08-19 DIAGNOSIS — M5412 Radiculopathy, cervical region: Secondary | ICD-10-CM | POA: Diagnosis not present

## 2023-08-19 DIAGNOSIS — M542 Cervicalgia: Secondary | ICD-10-CM | POA: Diagnosis not present

## 2023-08-19 DIAGNOSIS — M6281 Muscle weakness (generalized): Secondary | ICD-10-CM | POA: Diagnosis not present

## 2023-08-19 DIAGNOSIS — M62838 Other muscle spasm: Secondary | ICD-10-CM
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
## 2023-08-26 ENCOUNTER — Ambulatory Visit: Payer: 59 | Admitting: Physical Therapy

## 2023-08-27 ENCOUNTER — Other Ambulatory Visit: Payer: Self-pay | Admitting: Nurse Practitioner

## 2023-08-27 DIAGNOSIS — F32A Depression, unspecified: Secondary | ICD-10-CM

## 2023-08-27 NOTE — Telephone Encounter (Signed)
Please advise [facility]

## 2023-09-01 ENCOUNTER — Other Ambulatory Visit (HOSPITAL_COMMUNITY): Payer: Self-pay | Admitting: Psychiatry

## 2023-09-01 ENCOUNTER — Other Ambulatory Visit: Payer: Self-pay | Admitting: Internal Medicine

## 2023-09-01 DIAGNOSIS — F332 Major depressive disorder, recurrent severe without psychotic features: Secondary | ICD-10-CM

## 2023-09-01 DIAGNOSIS — Z21 Asymptomatic human immunodeficiency virus [HIV] infection status: Secondary | ICD-10-CM

## 2023-09-01 DIAGNOSIS — G629 Polyneuropathy, unspecified: Secondary | ICD-10-CM

## 2023-09-02 ENCOUNTER — Ambulatory Visit: Payer: 59

## 2023-09-03 ENCOUNTER — Ambulatory Visit: Payer: Self-pay | Admitting: Nurse Practitioner

## 2023-09-05 ENCOUNTER — Telehealth: Payer: 59 | Admitting: Physician Assistant

## 2023-09-05 ENCOUNTER — Encounter: Payer: Self-pay | Admitting: Physician Assistant

## 2023-09-05 DIAGNOSIS — J069 Acute upper respiratory infection, unspecified: Secondary | ICD-10-CM

## 2023-09-05 MED ORDER — BENZONATATE 100 MG PO CAPS: ORAL_CAPSULE | ORAL | 0 refills | Status: DC
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
## 2023-09-05 NOTE — Patient Instructions (Addendum)
VISIT SUMMARY:  You presented with symptoms of a viral illness, including diarrhea, nausea, headache, fatigue, and a low-grade fever, which began after attending a wedding. Despite some improvement, you have been unable to work this week. We discussed your symptoms and management strategies to help you recover.  YOUR PLAN:  -INFLUENZA-LIKE ILLNESS: This is a viral infection that causes symptoms like diarrhea, nausea, headache, fatigue, and fever. Continue to stay hydrated and follow a bland diet. You may use Pepto Bismol or an antidiarrheal for relief. A prescription for cough medication has been sent to [facility] in [place].  -NASAL CONGESTION: This is a blockage of the nasal passages usually due to membranes lining the nose becoming swollen from inflamed blood vessels. Use Flonase twice daily after a warm shower or applying a warm cloth over your nose to help open nasal passages. Continue taking Singulair nightly.  -WORK ABSENCE: You have been unable to work due to your illness. A note has been provided stating you can return to work on Saturday, 09/07/2023.  INSTRUCTIONS:  Please follow up if your symptoms do not improve or if they worsen. Continue with the prescribed medications and supportive care measures.

## 2023-09-09 ENCOUNTER — Ambulatory Visit: Payer: 59 | Admitting: Physical Therapy

## 2023-09-09 DIAGNOSIS — M5412 Radiculopathy, cervical region: Secondary | ICD-10-CM | POA: Diagnosis not present

## 2023-09-09 DIAGNOSIS — M6281 Muscle weakness (generalized): Secondary | ICD-10-CM | POA: Diagnosis not present

## 2023-09-09 DIAGNOSIS — M542 Cervicalgia: Secondary | ICD-10-CM

## 2023-09-09 DIAGNOSIS — M62838 Other muscle spasm: Secondary | ICD-10-CM | POA: Diagnosis not present
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
## 2023-09-22 ENCOUNTER — Other Ambulatory Visit: Payer: Self-pay | Admitting: Nurse Practitioner

## 2023-09-22 DIAGNOSIS — F419 Anxiety disorder, unspecified: Secondary | ICD-10-CM

## 2023-09-24 ENCOUNTER — Telehealth: Payer: 59 | Admitting: Nurse Practitioner

## 2023-09-25 ENCOUNTER — Ambulatory Visit: Payer: Self-pay | Admitting: Nurse Practitioner

## 2023-09-25 ENCOUNTER — Ambulatory Visit: Attending: Sports Medicine

## 2023-09-25 NOTE — Telephone Encounter (Signed)
 Red Word that prompted transfer to Nurse Triage: Patient is in extreme pain in all joints//Fingers are swollen//Back and neck pain//    Chief Complaint: Joint pain. Wrists are worse. Has pain in fingers and her back. OTC not helping. Requests "something for pain, nothing real strong, just something." Declines OV this week. Symptoms: Pain, swelling Frequency: Getting worse Pertinent Negatives: Patient denies  Disposition: [] ED /[] Urgent Care (no appt availability in office) / [] Appointment(In office/virtual)/ []  [facility]/ [] Home Care/ [x] Refused Recommended Disposition /[] [facility]/ [x]  Follow-up with PCP Additional Notes: Please advise pt.  Reason for Disposition  [1] MODERATE pain (e.g., interferes with normal activities) AND [2] present > 3 days  Answer Assessment - Initial Assessment Questions 1. ONSET: "When did the swelling start?" (e.g., minutes, hours, days, weeks)     For awhile, but getting worse 2. LOCATION: "What part of the wrist is swollen?"  "Are both wrists swollen or just one wrist?"     Both 3. SEVERITY: "How bad is the swelling?"    * NONE: No joint swelling.   * SKIN ONLY: Localized; small area of puffy or swollen skin.   * BALL OR LUMP: There is a small firm ball or lump; size of a pea, marble, or grape.   * MILD: Joint looks or feels mildly swollen or puffy.   * MODERATE: Swollen; interferes with normal activities (e.g., work or school); decreased range of movement.   * SEVERE: Very swollen; can't move swollen joint at all; unable to hold a cup of water.     Moderate 4. RECURRENT SYMPTOM: "Have you had wrist swelling before?" If Yes, ask: "When was the last time?" "What happened that time?"     Yes 5. CAUSE: "What do you think is causing the wrist swelling?" (e.g., arthritis, ganglion cyst, insect bite, recent injury)     Arthritis  6. OTHER SYMPTOMS: "Do you have any other symptoms?" (e.g., fever, hand pain)     Pain fingers, back 7.  PREGNANCY: "Is there any chance you are pregnant?" "When was your last menstrual period?"     NO  Protocols used: Wrist Swelling-A-AH

## 2023-09-27 NOTE — Telephone Encounter (Signed)
 Sent to pcp

## 2023-10-01 ENCOUNTER — Ambulatory Visit: Admitting: Physical Therapy

## 2023-10-02 ENCOUNTER — Ambulatory Visit: Attending: Family Medicine

## 2023-10-03 ENCOUNTER — Encounter: Payer: Self-pay | Admitting: Physical Therapy

## 2023-10-03 DIAGNOSIS — M545 Low back pain, unspecified: Secondary | ICD-10-CM | POA: Diagnosis not present

## 2023-10-03 DIAGNOSIS — S0990XA Unspecified injury of head, initial encounter: Secondary | ICD-10-CM | POA: Diagnosis not present

## 2023-10-03 DIAGNOSIS — Z043 Encounter for examination and observation following other accident: Secondary | ICD-10-CM | POA: Diagnosis not present

## 2023-10-03 DIAGNOSIS — M542 Cervicalgia: Secondary | ICD-10-CM | POA: Diagnosis not present

## 2023-10-03 DIAGNOSIS — M62830 Muscle spasm of back: Secondary | ICD-10-CM | POA: Diagnosis not present

## 2023-10-03 DIAGNOSIS — W19XXXA Unspecified fall, initial encounter: Secondary | ICD-10-CM | POA: Diagnosis not present

## 2023-10-03 DIAGNOSIS — S40011A Contusion of right shoulder, initial encounter: Secondary | ICD-10-CM | POA: Diagnosis not present

## 2023-10-03 DIAGNOSIS — S7002XA Contusion of left hip, initial encounter: Secondary | ICD-10-CM | POA: Diagnosis not present

## 2023-10-03 DIAGNOSIS — M549 Dorsalgia, unspecified: Secondary | ICD-10-CM | POA: Diagnosis not present

## 2023-10-03 DIAGNOSIS — R296 Repeated falls: Secondary | ICD-10-CM | POA: Diagnosis not present

## 2023-10-03 DIAGNOSIS — R55 Syncope and collapse: Secondary | ICD-10-CM | POA: Diagnosis not present

## 2023-10-03 DIAGNOSIS — S060X1A Concussion with loss of consciousness of 30 minutes or less, initial encounter: Secondary | ICD-10-CM | POA: Diagnosis not present

## 2023-10-03 DIAGNOSIS — F17210 Nicotine dependence, cigarettes, uncomplicated: Secondary | ICD-10-CM | POA: Diagnosis not present

## 2023-10-04 ENCOUNTER — Telehealth: Payer: Self-pay

## 2023-10-04 NOTE — Transitions of Care (Post Inpatient/ED Visit) (Signed)
 10/04/2023  Name: Cheryl Thompson MRN: [id number] DOB: [birth year]  Today's TOC FU Call Status: Today's TOC FU Call Status:: Successful TOC FU Call Completed TOC FU Call Complete Date: 10/04/23 Patient's Name and Date of Birth confirmed.  Transition Care Management Follow-up Telephone Call Date of Discharge: 10/03/23 Discharge Facility: Other ([facility]) Name of Other ([facility]) Discharge Facility: [facility] Type of Discharge: Emergency Department Reason for ED Visit: Other: (contusion left hip) How have you been since you were released from the hospital?: Same Any questions or concerns?: No  Items Reviewed: Did you receive and understand the discharge instructions provided?: Yes Medications obtained,verified, and reconciled?: Yes (Medications Reviewed) Any new allergies since your discharge?: No Dietary orders reviewed?: NA Do you have support at home?: No  Medications Reviewed Today: Medications Reviewed Today     Reviewed by Karena Addison, LPN (Licensed Practical Nurse) on 10/04/23 at 1009  Med List Status: <None>   Medication Order Taking? Sig Documenting Provider Last Dose Status Informant  albuterol (VENTOLIN HFA) 108 (90 Base) MCG/ACT inhaler [id number] No Inhale 2 puffs into the lungs every 6 (six) hours as needed for wheezing or shortness of breath. Donell Beers, FNP Taking Active   amLODipine (NORVASC) 5 MG tablet [id number] No Take 1 tablet (5 mg total) by mouth daily. Donell Beers, FNP Taking Active   Azelastine-Fluticasone 137-50 MCG/ACT SUSP [id number] No Place 1 spray into the nose in the morning and at bedtime. Ferol Luz, MD Taking Active   benzonatate (TESSALON) 100 MG capsule [id number]  Take 1-2 caps PO TID PRN Diona Foley  Active   BIKTARVY 50-200-25 MG TABS tablet [id number]  TAKE ONE TABLET BY MOUTH DAILY (PATIENT NEEDS APPOINTMENT) Luciana Axe Belia Heman, MD  Active   cetirizine (ZYRTEC) 10 MG tablet [id number] No Take 1 tablet (10  mg total) by mouth daily. Donell Beers, FNP Taking Active   cyclobenzaprine (FLEXERIL) 5 MG tablet [id number] No Take 1 tablet (5 mg total) by mouth at bedtime as needed for muscle spasms. Donell Beers, FNP Taking Active   DULoxetine (CYMBALTA) 60 MG capsule [id number]  TAKE ONE CAPSULE BY MOUTH DAILY AT Hillery Jacks, Harrietta Guardian, MD  Active   ezetimibe (ZETIA) 10 MG tablet [id number] No Take 1 tablet (10 mg total) by mouth daily. Donell Beers, FNP Taking Active   fluticasone (FLONASE) 50 MCG/ACT nasal spray [id number] No Place 2 sprays into both nostrils daily. Donell Beers, FNP Taking Active   hydrochlorothiazide (HYDRODIURIL) 25 MG tablet [id number] No TAKE 1 TABLET BY MOUTH DAILY FOR HIGH BLOOD PRESSURE Paseda, Baird Kay, FNP Taking Active   hydrOXYzine (ATARAX) 25 MG tablet [id number]  TAKE 1 TABLET BY MOUTH ONCE DAILY AS NEEDED FOR ANXIETY Ivonne Andrew, NP  Active   hyoscyamine (LEVSIN SL) 0.125 MG SL tablet [id number] No Place 1 tablet (0.125 mg total) under the tongue every 4 (four) hours as needed. Massie Maroon, FNP Taking Active Self           Med Note Raj Janus   Mon Sep 10, 2022 10:32 AM) 100mg   icosapent Ethyl (VASCEPA) 1 g capsule [id number] No Take 2 capsules (2 g total) by mouth 2 (two) times daily. Donell Beers, FNP Taking Active   ipratropium (ATROVENT) 0.06 % nasal spray [id number] No USE 2 SPRAYS IN EACH NOSTRIL FOUR TIMES DAILY Ferol Luz, MD Taking Active   levETIRAcetam (KEPPRA) 500 MG tablet [id number] No TAKE 1 TABLET(500 MG) BY MOUTH TWICE DAILY  Patient not taking: Reported on 05/17/2023   Massie Maroon, FNP Not Taking Active Self           Med Note Kerrie Buffalo Sep 10, 2022 10:31 AM) Pt take it one a day.  levothyroxine (SYNTHROID) 112 MCG tablet [id number] No TAKE ONE TABLET BY MOUTH DAILY AT 8AM BEFORE BREAKFAST Donell Beers, FNP Taking Active   metFORMIN (GLUCOPHAGE) 500 MG tablet  [id number] No TAKE ONE TABLET BY MOUTH TWICE DAILY @9AM  5PM WITH MEALS Ivonne Andrew, NP Taking Active   montelukast (SINGULAIR) 10 MG tablet [id number] No Take 1 tablet (10 mg total) by mouth at bedtime. Ferol Luz, MD Taking Active   pregabalin (LYRICA) 25 MG capsule [id number] No Take 1 capsule by mouth twice daily Thresa Ross, MD Taking Active   QUEtiapine (SEROQUEL) 100 MG tablet [id number]  TAKE ONE TABLET BY MOUTH DAILY AT 9PM AT BEDTIME Thresa Ross, MD  Active             Home Care and Equipment/Supplies: Were Home Health Services Ordered?: NA Any new equipment or medical supplies ordered?: NA  Functional Questionnaire: Do you need assistance with bathing/showering or dressing?: No Do you need assistance with meal preparation?: No Do you need assistance with eating?: No Do you have difficulty maintaining continence: No Do you need assistance with getting out of bed/getting out of a chair/moving?: No Do you have difficulty managing or taking your medications?: No  Follow up appointments reviewed: PCP Follow-up appointment confirmed?: Yes Date of PCP follow-up appointment?: 10/11/23 Follow-up Provider: [facility] Follow-up appointment confirmed?: NA Do you need transportation to your follow-up appointment?: No Do you understand care options if your condition(s) worsen?: Yes-patient verbalized understanding    SIGNATURE Karena Addison, LPN [facility] Nurse Health Advisor Direct Dial [phone]

## 2023-10-07 ENCOUNTER — Ambulatory Visit: Admitting: Physical Therapy

## 2023-10-10 ENCOUNTER — Encounter: Admitting: Physical Therapy

## 2023-10-11 ENCOUNTER — Ambulatory Visit: Payer: Self-pay | Admitting: Nurse Practitioner

## 2023-10-11 ENCOUNTER — Encounter: Payer: Self-pay | Admitting: Nurse Practitioner

## 2023-10-11 VITALS — BP 153/89 | HR 93 | Temp 98.0°F | Wt 232.8 lb

## 2023-10-11 DIAGNOSIS — S060X9D Concussion with loss of consciousness of unspecified duration, subsequent encounter: Secondary | ICD-10-CM | POA: Diagnosis not present

## 2023-10-11 DIAGNOSIS — W19XXXA Unspecified fall, initial encounter: Secondary | ICD-10-CM | POA: Insufficient documentation

## 2023-10-11 DIAGNOSIS — S40011D Contusion of right shoulder, subsequent encounter: Secondary | ICD-10-CM | POA: Diagnosis not present

## 2023-10-11 DIAGNOSIS — Z09 Encounter for follow-up examination after completed treatment for conditions other than malignant neoplasm: Secondary | ICD-10-CM | POA: Diagnosis not present

## 2023-10-11 DIAGNOSIS — I10 Essential (primary) hypertension: Secondary | ICD-10-CM | POA: Diagnosis not present

## 2023-10-11 DIAGNOSIS — E669 Obesity, unspecified: Secondary | ICD-10-CM

## 2023-10-11 DIAGNOSIS — S7001XD Contusion of right hip, subsequent encounter: Secondary | ICD-10-CM | POA: Diagnosis not present

## 2023-10-11 DIAGNOSIS — S7002XD Contusion of left hip, subsequent encounter: Secondary | ICD-10-CM | POA: Diagnosis not present

## 2023-10-11 DIAGNOSIS — S40011A Contusion of right shoulder, initial encounter: Secondary | ICD-10-CM | POA: Insufficient documentation

## 2023-10-11 DIAGNOSIS — S7001XA Contusion of right hip, initial encounter: Secondary | ICD-10-CM | POA: Insufficient documentation

## 2023-10-11 DIAGNOSIS — S7002XA Contusion of left hip, initial encounter: Secondary | ICD-10-CM | POA: Insufficient documentation

## 2023-10-11 DIAGNOSIS — W19XXXD Unspecified fall, subsequent encounter: Secondary | ICD-10-CM | POA: Diagnosis not present

## 2023-10-11 MED ORDER — PREDNISONE 10 MG (21) PO TBPK: ORAL_TABLET | ORAL | 0 refills | Status: DC

## 2023-10-11 MED ORDER — METHYLPREDNISOLONE SODIUM SUCC 40 MG IJ SOLR
40.0000 mg | Freq: Once | INTRAMUSCULAR | Status: AC
  Administered 2023-10-11: 40 mg via INTRAMUSCULAR

## 2023-10-11 MED ORDER — OXYCODONE-ACETAMINOPHEN 5-325 MG PO TABS: 1.0000 | ORAL_TABLET | Freq: Three times a day (TID) | ORAL | 0 refills | Status: AC | PRN

## 2023-10-11 MED ORDER — CYCLOBENZAPRINE HCL 5 MG PO TABS: 5.0000 mg | ORAL_TABLET | Freq: Every evening | ORAL | 0 refills | Status: DC | PRN

## 2023-10-11 NOTE — Patient Instructions (Signed)
 Ypu were given Solu-Medrol 40 mg injection in the office today From tomorrow please start taking prednisone as ordered, take with food Continue Flexeril 5 mg at bedtime, can take Percocet 1 tablet every 8 hours as needed Also recommend using heating pad or ice, taking Tylenol 650 mg every 6 hours as needed    It is important that you exercise regularly at least 30 minutes 5 times a week as tolerated  Think about what you will eat, plan ahead. Choose " clean, green, fresh or frozen" over canned, processed or packaged foods which are more sugary, salty and fatty. 70 to 75% of food eaten should be vegetables and fruit. Three meals at set times with snacks allowed between meals, but they must be fruit or vegetables. Aim to eat over a 12 hour period , example 7 am to 7 pm, and STOP after  your last meal of the day. Drink water,generally about 64 ounces per day, no other drink is as healthy. Fruit juice is best enjoyed in a healthy way, by EATING the fruit.  Thanks for choosing [facility] we consider it a privelige to serve you.
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
## 2023-10-11 NOTE — Assessment & Plan Note (Signed)
 Hospital chart reviewed, including discharge summary Medications reconciled and reviewed with the patient in detail

## 2023-10-11 NOTE — Assessment & Plan Note (Signed)
 Blood pressure most likely elevated today due to her pain Will continue amlodipine 5 mg daily, hydrochlorothiazide 25 mg daily DASH diet and commitment to daily physical activity for a minimum of 30 minutes discussed and encouraged, as a part of hypertension management. The importance of attaining a healthy weight is also discussed.     10/11/2023    2:13 PM 10/11/2023    2:07 PM 08/05/2023    2:34 PM 08/05/2023    2:22 PM 07/31/2023    4:16 PM 07/31/2023    3:35 PM 07/25/2023    9:48 AM  BP/Weight  Systolic BP 153 160 131 147 160 158 166  Diastolic BP 89 92 79 84 90 80 161  Wt. (Lbs)  232.8  234  238 238  BMI  35.4 kg/m2  35.58 kg/m2  36.19 kg/m2 36.19 kg/m2

## 2023-10-11 NOTE — Assessment & Plan Note (Signed)
 Wt Readings from Last 3 Encounters:  10/11/23 232 lb 12.8 oz (105.6 kg)  08/05/23 234 lb (106.1 kg)  07/31/23 238 lb (108 kg)   Body mass index is 35.4 kg/m.

## 2023-10-11 NOTE — Assessment & Plan Note (Signed)
-   methylPREDNISolone sodium succinate (SOLU-MEDROL) 40 mg/mL injection 40 mg  oxyCODONE-acetaminophen (PERCOCET/ROXICET) 5-325 MG tablet; Take 1 tablet by mouth every 8 (eight) hours as needed for up to 5 days for severe pain (pain score 7-10).  Dispense: 9 tablet; Refill: 0 - predniSONE (STERAPRED UNI-PAK 21 TAB) 10 MG (21) TBPK tablet; Takes as instructed on the packaging  Dispense: 1 each; Refill: 0 - cyclobenzaprine (FLEXERIL) 5 MG tablet; Take 1 tablet (5 mg total) by mouth at bedtime as needed for muscle spasms.  Dispense: 60 tablet; Refill: 0 Use of heating pad or ice encouraged, and take Tylenol 650 mg every 6 hours as needed No NSAIDs currently on Biktarvy For prevention education provided

## 2023-10-11 NOTE — Assessment & Plan Note (Signed)
 For prevention education provided Need to avoid fall discussed Could consider physical therapy

## 2023-10-11 NOTE — Assessment & Plan Note (Addendum)
 Need to avoid falls discussed, fall prevention education provided Could consider physical therapy Take Tylenol 650 mg every 6 hours as needed

## 2023-10-15 DIAGNOSIS — M542 Cervicalgia: Secondary | ICD-10-CM | POA: Diagnosis not present

## 2023-10-18 ENCOUNTER — Ambulatory Visit (INDEPENDENT_AMBULATORY_CARE_PROVIDER_SITE_OTHER): Payer: Self-pay | Admitting: Nurse Practitioner

## 2023-10-18 ENCOUNTER — Encounter: Payer: Self-pay | Admitting: Nurse Practitioner

## 2023-10-18 ENCOUNTER — Encounter (INDEPENDENT_AMBULATORY_CARE_PROVIDER_SITE_OTHER): Payer: Self-pay | Admitting: Otolaryngology

## 2023-10-18 VITALS — BP 142/96 | HR 88 | Temp 97.1°F | Wt 230.0 lb

## 2023-10-18 DIAGNOSIS — E1165 Type 2 diabetes mellitus with hyperglycemia: Secondary | ICD-10-CM | POA: Diagnosis not present

## 2023-10-18 DIAGNOSIS — E039 Hypothyroidism, unspecified: Secondary | ICD-10-CM

## 2023-10-18 DIAGNOSIS — W19XXXD Unspecified fall, subsequent encounter: Secondary | ICD-10-CM | POA: Diagnosis not present

## 2023-10-18 DIAGNOSIS — F17200 Nicotine dependence, unspecified, uncomplicated: Secondary | ICD-10-CM

## 2023-10-18 DIAGNOSIS — E785 Hyperlipidemia, unspecified: Secondary | ICD-10-CM

## 2023-10-18 DIAGNOSIS — D649 Anemia, unspecified: Secondary | ICD-10-CM | POA: Diagnosis not present

## 2023-10-18 DIAGNOSIS — I10 Essential (primary) hypertension: Secondary | ICD-10-CM | POA: Diagnosis not present

## 2023-10-18 DIAGNOSIS — M5416 Radiculopathy, lumbar region: Secondary | ICD-10-CM

## 2023-10-18 DIAGNOSIS — R2689 Other abnormalities of gait and mobility: Secondary | ICD-10-CM | POA: Diagnosis not present

## 2023-10-18 NOTE — Assessment & Plan Note (Signed)
 Lab Results  Component Value Date   HGBA1C 6.3 (A) 08/05/2023  Well-controlled on metformin 500 mg twice daily Continue current medication Patient counseled on low-carb diet Diabetic foot exam completed Encouraged to get her diabetes eye exam completed

## 2023-10-18 NOTE — Assessment & Plan Note (Signed)
 For prevention education completed Will refer patient to ENT for further evaluation

## 2023-10-18 NOTE — Assessment & Plan Note (Addendum)
 Lab Results  Component Value Date   CHOL 190 09/10/2022   HDL 28 (L) 09/10/2022   LDLCALC 103 (H) 09/10/2022   LDLDIRECT 70 04/22/2023   TRIG 345 (H) 09/10/2022   CHOLHDL 6.8 (H) 09/10/2022  On Zetia 10 mg daily, Vascepa 2 g twice daily Check lipid panel

## 2023-10-18 NOTE — Assessment & Plan Note (Addendum)
 Continue Lyrica 25 mg twice daily, duloxetine 60 mg daily, Flexeril 5 mg at bedtime as needed States that her pain feels much better and she is ready to go back to work

## 2023-10-18 NOTE — Assessment & Plan Note (Addendum)
 Lab Results  Component Value Date   TSH 5.740 (H) 04/22/2023  On levothyroxine 112 mcg daily Checking TSH T4 levels

## 2023-10-18 NOTE — Assessment & Plan Note (Signed)
 Fall prevention education completed Referring patient to ENT for further evaluation of causes of her frequent fall She did decline restarting physical therapy for the

## 2023-10-18 NOTE — Patient Instructions (Signed)
Around 3 times per week, check your blood pressure 2 times per day. once in the morning and once in the evening. The readings should be at least one minute apart. Write down these values and bring them to your next nurse visit/appointment.  When you check your BP, make sure you have been doing something calm/relaxing 5 minutes prior to checking. Both feet should be flat on the floor and you should be sitting. Use your left arm and make sure it is in a relaxed position (on a table), and that the cuff is at the approximate level/height of your heart.   Blood pressure goal is less than 130/80.    It is important that you exercise regularly at least 30 minutes 5 times a week as tolerated  Think about what you will eat, plan ahead. Choose " clean, green, fresh or frozen" over canned, processed or packaged foods which are more sugary, salty and fatty. 70 to 75% of food eaten should be vegetables and fruit. Three meals at set times with snacks allowed between meals, but they must be fruit or vegetables. Aim to eat over a 12 hour period , example 7 am to 7 pm, and STOP after  your last meal of the day. Drink water,generally about 64 ounces per day, no other drink is as healthy. Fruit juice is best enjoyed in a healthy way, by EATING the fruit.  Thanks for choosing [facility] we consider it a privelige to serve you.

## 2023-10-18 NOTE — Assessment & Plan Note (Addendum)
 Smokes about  one pack a week   Asked about quitting: confirms that he/she currently smokes cigarettes Advise to quit smoking: Educated about QUITTING to reduce the risk of cancer, cardio and cerebrovascular disease. Assess willingness: Unwilling to quit at this time, but is working on cutting back. Assist with counseling and pharmacotherapy: Counseled for 5 minutes and literature provided. Arrange for follow up: follow up in 4 months and continue to offer help.
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
## 2023-10-18 NOTE — Assessment & Plan Note (Signed)
 On amlodipine 5 mg daily, hydrochlorothiazide 25 mg daily She has not taking both medications today Patient encouraged to monitor blood pressure at home keep a log and bring to next visit with the nurse in 1 week BP goal is less than 130/80 DASH diet and commitment to daily physical activity for a minimum of 30 minutes discussed and encouraged, as a part of hypertension management. The importance of attaining a healthy weight is also discussed.     10/18/2023   11:08 AM 10/18/2023   11:04 AM 10/11/2023    2:13 PM 10/11/2023    2:07 PM 08/05/2023    2:34 PM 08/05/2023    2:22 PM 07/31/2023    4:16 PM  BP/Weight  Systolic BP 142 151 153 160 131 147 160  Diastolic BP 96 92 89 92 79 84 90  Wt. (Lbs) 230 230  232.8  234   BMI 34.97 kg/m2 34.97 kg/m2  35.4 kg/m2  35.58 kg/m2

## 2023-10-18 NOTE — Assessment & Plan Note (Signed)
 Lab Results  Component Value Date   WBC 6.3 04/22/2023   HGB 10.7 (L) 04/22/2023   HCT 32.2 (L) 04/22/2023   MCV 89 04/22/2023   PLT 110 (L) 04/22/2023  Rechecking labs

## 2023-10-19 LAB — BASIC METABOLIC PANEL WITH GFR
BUN/Creatinine Ratio: 11 (ref 9–23)
BUN: 11 mg/dL (ref 6–24)
CO2: 22 mmol/L (ref 20–29)
Calcium: 9 mg/dL (ref 8.7–10.2)
Chloride: 102 mmol/L (ref 96–106)
Creatinine, Ser: 0.99 mg/dL (ref 0.57–1.00)
Glucose: 102 mg/dL — ABNORMAL HIGH (ref 70–99)
Potassium: 3.7 mmol/L (ref 3.5–5.2)
Sodium: 136 mmol/L (ref 134–144)
eGFR: 66 mL/min/{1.73_m2} (ref >59)

## 2023-10-19 LAB — LIPID PANEL
Chol/HDL Ratio: 10.1 ratio — ABNORMAL HIGH (ref 0.0–4.4)
Cholesterol, Total: 212 mg/dL — ABNORMAL HIGH (ref 100–199)
HDL: 21 mg/dL — ABNORMAL LOW (ref >39)
LDL Chol Calc (NIH): 80 mg/dL (ref 0–99)
Triglycerides: 691 mg/dL (ref 0–149)
VLDL Cholesterol Cal: 111 mg/dL — ABNORMAL HIGH (ref 5–40)

## 2023-10-19 LAB — TSH+FREE T4
Free T4: 0.96 ng/dL (ref 0.82–1.77)
TSH: 9.68 u[IU]/mL — ABNORMAL HIGH (ref 0.450–4.500)

## 2023-10-19 LAB — IRON,TIBC AND FERRITIN PANEL
Ferritin: 23 ng/mL (ref 15–150)
Iron Saturation: 16 % (ref 15–55)
Iron: 64 ug/dL (ref 27–159)
Total Iron Binding Capacity: 398 ug/dL (ref 250–450)
UIBC: 334 ug/dL (ref 131–425)

## 2023-10-19 LAB — CBC
Hematocrit: 33.8 % — ABNORMAL LOW (ref 34.0–46.6)
Hemoglobin: 11.1 g/dL (ref 11.1–15.9)
MCH: 28.8 pg (ref 26.6–33.0)
MCHC: 32.8 g/dL (ref 31.5–35.7)
MCV: 88 fL (ref 79–97)
Platelets: 123 10*3/uL — ABNORMAL LOW (ref 150–450)
RBC: 3.85 x10E6/uL (ref 3.77–5.28)
RDW: 14.2 % (ref 11.7–15.4)
WBC: 6.9 10*3/uL (ref 3.4–10.8)

## 2023-10-21 ENCOUNTER — Other Ambulatory Visit: Payer: Self-pay | Admitting: Nurse Practitioner

## 2023-10-21 DIAGNOSIS — E039 Hypothyroidism, unspecified: Secondary | ICD-10-CM

## 2023-10-21 DIAGNOSIS — E781 Pure hyperglyceridemia: Secondary | ICD-10-CM

## 2023-10-21 DIAGNOSIS — E785 Hyperlipidemia, unspecified: Secondary | ICD-10-CM

## 2023-10-21 MED ORDER — FENOFIBRATE 48 MG PO TABS: 48.0000 mg | ORAL_TABLET | Freq: Every day | ORAL | 1 refills | Status: DC

## 2023-10-21 MED ORDER — REPATHA SURECLICK 140 MG/ML ~~LOC~~ SOAJ: 140.0000 mg | SUBCUTANEOUS | 2 refills | Status: AC

## 2023-10-21 MED ORDER — LEVOTHYROXINE SODIUM 137 MCG PO TABS: 137.0000 ug | ORAL_TABLET | Freq: Every day | ORAL | 1 refills | Status: DC

## 2023-10-25 ENCOUNTER — Ambulatory Visit: Payer: Self-pay

## 2023-10-28 ENCOUNTER — Ambulatory Visit

## 2023-10-31 ENCOUNTER — Ambulatory Visit

## 2023-11-01 ENCOUNTER — Other Ambulatory Visit: Payer: Self-pay | Admitting: Internal Medicine

## 2023-11-01 ENCOUNTER — Encounter (HOSPITAL_COMMUNITY): Payer: Self-pay | Admitting: Psychiatry

## 2023-11-01 ENCOUNTER — Telehealth (INDEPENDENT_AMBULATORY_CARE_PROVIDER_SITE_OTHER): Payer: 59 | Admitting: Psychiatry

## 2023-11-01 DIAGNOSIS — F4310 Post-traumatic stress disorder, unspecified: Secondary | ICD-10-CM | POA: Diagnosis not present

## 2023-11-01 DIAGNOSIS — F3163 Bipolar disorder, current episode mixed, severe, without psychotic features: Secondary | ICD-10-CM

## 2023-11-01 DIAGNOSIS — F411 Generalized anxiety disorder: Secondary | ICD-10-CM | POA: Diagnosis not present

## 2023-11-01 DIAGNOSIS — F332 Major depressive disorder, recurrent severe without psychotic features: Secondary | ICD-10-CM
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
## 2023-11-04 ENCOUNTER — Telehealth: Payer: Self-pay

## 2023-11-04 ENCOUNTER — Other Ambulatory Visit: Payer: Self-pay

## 2023-11-04 NOTE — Telephone Encounter (Signed)
 Pharmacy Patient Advocate Encounter  Received notification from [facility] that Prior Authorization for REPATHA  has been APPROVED from 11/04/2023 to 05/05/2024   PA #/Case ID/Reference #: [id number]  Per [facility], medication was filled and picked up on 4/7

## 2023-11-04 NOTE — Telephone Encounter (Signed)
 Pharmacy Patient Advocate Encounter   Received notification from Physician's Office that prior authorization for REPATHA  is required/requested.   Insurance verification completed.   The patient is insured through [place] .   Per test claim: PA required; PA submitted to above mentioned insurance via CoverMyMeds Key/confirmation #/EOC [facility] Status is pending

## 2023-11-12 ENCOUNTER — Encounter (INDEPENDENT_AMBULATORY_CARE_PROVIDER_SITE_OTHER): Payer: Self-pay

## 2023-11-17 DIAGNOSIS — M545 Low back pain, unspecified: Secondary | ICD-10-CM | POA: Diagnosis not present

## 2023-11-17 DIAGNOSIS — M6281 Muscle weakness (generalized): Secondary | ICD-10-CM | POA: Diagnosis not present

## 2023-11-17 DIAGNOSIS — E785 Hyperlipidemia, unspecified: Secondary | ICD-10-CM | POA: Diagnosis not present

## 2023-11-17 DIAGNOSIS — M109 Gout, unspecified: Secondary | ICD-10-CM | POA: Diagnosis not present

## 2023-11-17 DIAGNOSIS — M5410 Radiculopathy, site unspecified: Secondary | ICD-10-CM | POA: Diagnosis not present

## 2023-11-17 DIAGNOSIS — M5416 Radiculopathy, lumbar region: Secondary | ICD-10-CM | POA: Diagnosis not present

## 2023-11-17 DIAGNOSIS — D649 Anemia, unspecified: Secondary | ICD-10-CM | POA: Diagnosis not present

## 2023-11-17 DIAGNOSIS — I10 Essential (primary) hypertension: Secondary | ICD-10-CM | POA: Diagnosis not present

## 2023-11-19 ENCOUNTER — Ambulatory Visit (INDEPENDENT_AMBULATORY_CARE_PROVIDER_SITE_OTHER): Admitting: Sports Medicine

## 2023-11-19 ENCOUNTER — Encounter: Payer: Self-pay | Admitting: Sports Medicine

## 2023-11-19 ENCOUNTER — Ambulatory Visit (HOSPITAL_BASED_OUTPATIENT_CLINIC_OR_DEPARTMENT_OTHER)
Admission: RE | Admit: 2023-11-19 | Discharge: 2023-11-19 | Disposition: A | Discharge status: Home / Self Care | Source: Ambulatory Visit | Attending: Sports Medicine | Admitting: Sports Medicine

## 2023-11-19 VITALS — BP 148/90 | Ht 68.0 in | Wt 230.0 lb

## 2023-11-19 DIAGNOSIS — S060X9D Concussion with loss of consciousness of unspecified duration, subsequent encounter: Secondary | ICD-10-CM | POA: Diagnosis not present

## 2023-11-19 DIAGNOSIS — S40011D Contusion of right shoulder, subsequent encounter: Secondary | ICD-10-CM

## 2023-11-19 DIAGNOSIS — R0781 Pleurodynia: Secondary | ICD-10-CM | POA: Insufficient documentation

## 2023-11-19 DIAGNOSIS — S7002XD Contusion of left hip, subsequent encounter: Secondary | ICD-10-CM

## 2023-11-19 DIAGNOSIS — W19XXXD Unspecified fall, subsequent encounter: Secondary | ICD-10-CM

## 2023-11-19 DIAGNOSIS — M5441 Lumbago with sciatica, right side: Secondary | ICD-10-CM | POA: Diagnosis not present

## 2023-11-19 DIAGNOSIS — W19XXXA Unspecified fall, initial encounter: Secondary | ICD-10-CM | POA: Diagnosis not present

## 2023-11-19 DIAGNOSIS — S2241XA Multiple fractures of ribs, right side, initial encounter for closed fracture: Secondary | ICD-10-CM | POA: Insufficient documentation

## 2023-11-19 DIAGNOSIS — S7001XD Contusion of right hip, subsequent encounter: Secondary | ICD-10-CM | POA: Diagnosis not present

## 2023-11-19 MED ORDER — CYCLOBENZAPRINE HCL 5 MG PO TABS: 5.0000 mg | ORAL_TABLET | Freq: Every evening | ORAL | 0 refills | Status: DC | PRN

## 2023-11-19 NOTE — Progress Notes (Addendum)
   Subjective:    Patient ID: Cheryl Thompson, female    DOB: 1965/12/15, 58 y.o.   MRN: [id number]  HPI chief complaint: Back and side pain  Patient is a very pleasant 58 year old female that presents today with back pain and spasm.  She fell about 3 weeks ago initially injuring her back.  She was seen by a physician and x-rays of her lumbar spine, right shoulder, and right hip were negative for fracture.  She then had an acute worsening of pain a few days ago while simply straightening up some pillows on her couch.  She had a sudden spasm that she localizes diffusely to the right side of her lumbar spine and rib cage.  She was seen in the emergency room on May 4 and prescribed prednisone .  That has been helpful and she has had success with prednisone  in the past.  She also has Flexeril  which she takes as needed.  Pain and spasm will occasionally radiate into the right leg.  Past medical history reviewed Medications reviewed Allergies reviewed   Review of Systems As above    Objective:   Physical Exam  Well-developed, well-nourished.  No acute distress  Lumbar spine: Limited range of motion.  There is some tenderness and spasm along the right paraspinal musculature.  Also some tenderness along the right rib cage.  No gross neurological deficit of either lower extremity.      Assessment & Plan:   Lumbar strain versus rib fracture  X-rays to rule out a right-sided rib fracture.  I will MyChart message those results when available.  In the meantime, she will take her prednisone  to completion and I will refill her Flexeril .  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  So I will excuse her from work for the next week and she may return to work on May 15.  If symptoms persist and x-rays are negative for fracture then consider physical therapy.  She has had physical therapy here at the [facility] outpatient physical therapy in the past.  This note was  dictated using Dragon naturally speaking software and may contain errors in syntax, spelling, or content which have not been identified prior to signing this note.   Addendum: X-rays show nondisplaced fractures of the right anterior 8th, 9th, and 10th ribs.  Patient notified of these results through MyChart after a phone call was unsuccessful due to her cell phone voicemail not being set up.  I recommend a rib belt for pain control with a follow-up visit with me again in 3 weeks.  We will amend her out of work note to keep her out of work until that follow-up visit.

## 2023-11-25 ENCOUNTER — Other Ambulatory Visit: Payer: Self-pay | Admitting: Internal Medicine

## 2023-12-03 ENCOUNTER — Ambulatory Visit: Payer: Self-pay

## 2023-12-03 ENCOUNTER — Other Ambulatory Visit (HOSPITAL_COMMUNITY): Payer: Self-pay | Admitting: Psychiatry

## 2023-12-03 ENCOUNTER — Telehealth

## 2023-12-03 DIAGNOSIS — Z8673 Personal history of transient ischemic attack (TIA), and cerebral infarction without residual deficits: Secondary | ICD-10-CM | POA: Diagnosis not present

## 2023-12-03 DIAGNOSIS — Z79899 Other long term (current) drug therapy: Secondary | ICD-10-CM | POA: Diagnosis not present

## 2023-12-03 DIAGNOSIS — Z8669 Personal history of other diseases of the nervous system and sense organs: Secondary | ICD-10-CM | POA: Diagnosis not present

## 2023-12-03 DIAGNOSIS — M5030 Other cervical disc degeneration, unspecified cervical region: Secondary | ICD-10-CM | POA: Diagnosis not present

## 2023-12-03 DIAGNOSIS — I10 Essential (primary) hypertension: Secondary | ICD-10-CM | POA: Diagnosis not present

## 2023-12-03 DIAGNOSIS — F17210 Nicotine dependence, cigarettes, uncomplicated: Secondary | ICD-10-CM | POA: Diagnosis not present

## 2023-12-03 DIAGNOSIS — M79632 Pain in left forearm: Secondary | ICD-10-CM | POA: Diagnosis not present

## 2023-12-03 DIAGNOSIS — F332 Major depressive disorder, recurrent severe without psychotic features: Secondary | ICD-10-CM

## 2023-12-03 DIAGNOSIS — M79622 Pain in left upper arm: Secondary | ICD-10-CM | POA: Diagnosis not present

## 2023-12-03 DIAGNOSIS — E785 Hyperlipidemia, unspecified: Secondary | ICD-10-CM | POA: Diagnosis not present

## 2023-12-03 DIAGNOSIS — M25512 Pain in left shoulder: Secondary | ICD-10-CM | POA: Diagnosis not present

## 2023-12-03 DIAGNOSIS — M79602 Pain in left arm: Secondary | ICD-10-CM | POA: Diagnosis not present

## 2023-12-03 DIAGNOSIS — M542 Cervicalgia: Secondary | ICD-10-CM | POA: Diagnosis not present

## 2023-12-03 NOTE — Telephone Encounter (Signed)
 Copied from CRM [id number]. Topic: Clinical - Red Word Triage >> Dec 03, 2023  3:16 PM Elle L wrote: Red Word that prompted transfer to Nurse Triage: The patient hurt her left arm when giving her dog a bath and she cannot straighten it out all the way or bend it and it is causing her severe pain. She scheduled a virtual urgent care appointment for later today but is requesting to see if she should go to the emergency room instead.   Chief Complaint: Left arm injury, after bathing a dog. Unable to bend arm now. Symptoms: pain Frequency: yesterday Pertinent Negatives: Patient denies fever Disposition: [x] ED /[] Urgent Care (no appt availability in office) / [] Appointment(In office/virtual)/ []  [facility]/ [] Home Care/ [] Refused Recommended Disposition /[] [facility]/ []  Follow-up with PCP Additional Notes: agrees  Reason for Disposition  Sounds like a serious injury to the triager  Answer Assessment - Initial Assessment Questions 1. MECHANISM: "How did the injury happen?"     Bathing puppy 2. ONSET: "When did the injury happen?" (Minutes or hours ago)      yesterday 3. LOCATION: "Where is the injury located?" "Which arm?"     left 4. APPEARANCE of INJURY: "What does the injury look like?"      No swelling 5. SEVERITY: "Can you use the arm normally?"      severe 6. SWELLING or BRUISING: "is there any swelling or bruising?" If Yes, ask: "How large is it? (e.g., inches, centimeters)      no 7. PAIN: "Is there pain?" If Yes, ask: "How bad is the pain?"    (Scale 1-10; or mild, moderate, severe)   - NONE (0): No pain.   - MILD (1-3): Doesn't interfere with normal activities.   - MODERATE (4-7): Interferes with normal activities (e.g., work or school) or awakens from sleep.   - SEVERE (8-10): Excruciating pain, unable to do any normal activities, unable to hold a cup of water.     severe 8. TETANUS: For any breaks in the skin, ask: "When was the last tetanus booster?"      N/a 9. OTHER SYMPTOMS: "Do you have any other symptoms?"  (e.g., numbness in hand)     no 10. PREGNANCY: "Is there any chance you are pregnant?" "When was your last menstrual period?"       no  Protocols used: Arm Injury-A-AH

## 2023-12-05 ENCOUNTER — Ambulatory Visit (INDEPENDENT_AMBULATORY_CARE_PROVIDER_SITE_OTHER): Payer: Self-pay

## 2023-12-05 VITALS — Ht 69.0 in | Wt 230.0 lb

## 2023-12-05 DIAGNOSIS — Z0000 Encounter for general adult medical examination without abnormal findings: Secondary | ICD-10-CM

## 2023-12-05 DIAGNOSIS — Z135 Encounter for screening for eye and ear disorders: Secondary | ICD-10-CM

## 2023-12-05 DIAGNOSIS — Z122 Encounter for screening for malignant neoplasm of respiratory organs: Secondary | ICD-10-CM | POA: Diagnosis not present

## 2023-12-05 NOTE — Patient Instructions (Signed)
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
## 2023-12-11 ENCOUNTER — Ambulatory Visit: Admitting: Sports Medicine

## 2023-12-12 ENCOUNTER — Ambulatory Visit: Admitting: Sports Medicine

## 2023-12-13 ENCOUNTER — Other Ambulatory Visit

## 2023-12-13 ENCOUNTER — Other Ambulatory Visit: Payer: Self-pay | Admitting: Nurse Practitioner

## 2023-12-13 DIAGNOSIS — E039 Hypothyroidism, unspecified: Secondary | ICD-10-CM

## 2023-12-18 ENCOUNTER — Ambulatory Visit
Admission: RE | Admit: 2023-12-18 | Discharge: 2023-12-18 | Disposition: A | Discharge status: Home / Self Care | Source: Ambulatory Visit | Attending: Nurse Practitioner

## 2023-12-18 DIAGNOSIS — Z122 Encounter for screening for malignant neoplasm of respiratory organs: Secondary | ICD-10-CM

## 2023-12-19 ENCOUNTER — Ambulatory Visit (INDEPENDENT_AMBULATORY_CARE_PROVIDER_SITE_OTHER): Admitting: Sports Medicine

## 2023-12-19 VITALS — BP 183/96 | Ht 69.0 in | Wt 230.0 lb

## 2023-12-19 DIAGNOSIS — M25522 Pain in left elbow: Secondary | ICD-10-CM

## 2023-12-19 NOTE — Progress Notes (Signed)
   Subjective:    Patient ID: Cheryl Thompson, female    DOB: 1966/01/19, 58 y.o.   MRN: [id number]  HPI  Cheryl Thompson is here to follow-up on right sided rib fractures.  Her pain is improving.  She still has times where her ribs will ache but her pain is better than upon her initial presentation.  Her main complaint today is left elbow pain.  Pain began acutely about 2-1/2 weeks ago while bathing her dog.  She has a history of a prior radial head fracture in the same elbow.  She experienced a sudden onset of pain in the elbow that radiated up to the shoulder and down to the wrist.  She has been unable to completely extend or flex the elbow since.  Recent x-rays of the left elbow show evidence of her old radial head fracture.  She is ambidextrous.  Review of Systems As above    Objective:   Physical Exam  No tenderness to palpation along the right posterior rib cage  Left elbow: Patient has been able to obtain terminal extension.  Flexion is to about 100 degrees.  No obvious effusion.  Slight tenderness to palpation along the lateral elbow.  Neurovascularly intact distally.      Assessment & Plan:   Healing right rib fractures Left elbow pain and lack of range of motion with x-ray evidence of old radial head fracture  Reassurance regarding her rib fractures.  This should continue to heal quite nicely.  For her left elbow I will refer her to [facility].  She has seen them in the past for her knees.  My concern is that she may have loose bodies in the left elbow that need to be arthroscopically removed.  She is in agreement with that plan.  In the meantime I have encouraged her to try to work on range of motion best that she can.  She will follow-up with me as needed.  This note was dictated using Dragon naturally speaking software and may contain errors in syntax, spelling, or content which have not been identified prior to signing this note.

## 2023-12-23 DIAGNOSIS — M19022 Primary osteoarthritis, left elbow: Secondary | ICD-10-CM | POA: Insufficient documentation

## 2023-12-23 DIAGNOSIS — M25522 Pain in left elbow: Secondary | ICD-10-CM | POA: Insufficient documentation

## 2024-01-02 ENCOUNTER — Emergency Department (HOSPITAL_BASED_OUTPATIENT_CLINIC_OR_DEPARTMENT_OTHER)
Admission: EM | Admit: 2024-01-02 | Discharge: 2024-01-02 | Disposition: A | Discharge status: Home / Self Care | Attending: Emergency Medicine | Admitting: Emergency Medicine

## 2024-01-02 ENCOUNTER — Emergency Department (HOSPITAL_BASED_OUTPATIENT_CLINIC_OR_DEPARTMENT_OTHER)

## 2024-01-02 ENCOUNTER — Ambulatory Visit: Payer: Self-pay

## 2024-01-02 ENCOUNTER — Other Ambulatory Visit: Payer: Self-pay

## 2024-01-02 ENCOUNTER — Encounter (HOSPITAL_BASED_OUTPATIENT_CLINIC_OR_DEPARTMENT_OTHER): Payer: Self-pay | Admitting: Emergency Medicine

## 2024-01-02 DIAGNOSIS — Z21 Asymptomatic human immunodeficiency virus [HIV] infection status: Secondary | ICD-10-CM | POA: Diagnosis not present

## 2024-01-02 DIAGNOSIS — Z043 Encounter for examination and observation following other accident: Secondary | ICD-10-CM | POA: Diagnosis not present

## 2024-01-02 DIAGNOSIS — M79601 Pain in right arm: Secondary | ICD-10-CM | POA: Diagnosis not present

## 2024-01-02 DIAGNOSIS — Y92009 Unspecified place in unspecified non-institutional (private) residence as the place of occurrence of the external cause: Secondary | ICD-10-CM | POA: Insufficient documentation

## 2024-01-02 DIAGNOSIS — E039 Hypothyroidism, unspecified: Secondary | ICD-10-CM | POA: Diagnosis not present

## 2024-01-02 DIAGNOSIS — Z8673 Personal history of transient ischemic attack (TIA), and cerebral infarction without residual deficits: Secondary | ICD-10-CM | POA: Insufficient documentation

## 2024-01-02 DIAGNOSIS — Z7984 Long term (current) use of oral hypoglycemic drugs: Secondary | ICD-10-CM | POA: Diagnosis not present

## 2024-01-02 DIAGNOSIS — E1140 Type 2 diabetes mellitus with diabetic neuropathy, unspecified: Secondary | ICD-10-CM | POA: Insufficient documentation

## 2024-01-02 DIAGNOSIS — M79621 Pain in right upper arm: Secondary | ICD-10-CM | POA: Insufficient documentation

## 2024-01-02 DIAGNOSIS — W01198A Fall on same level from slipping, tripping and stumbling with subsequent striking against other object, initial encounter: Secondary | ICD-10-CM | POA: Insufficient documentation

## 2024-01-02 DIAGNOSIS — I10 Essential (primary) hypertension: Secondary | ICD-10-CM | POA: Diagnosis not present

## 2024-01-02 DIAGNOSIS — W19XXXA Unspecified fall, initial encounter: Secondary | ICD-10-CM

## 2024-01-02 DIAGNOSIS — Z79899 Other long term (current) drug therapy: Secondary | ICD-10-CM | POA: Insufficient documentation

## 2024-01-02 DIAGNOSIS — S0990XA Unspecified injury of head, initial encounter: Secondary | ICD-10-CM | POA: Diagnosis not present

## 2024-01-02 DIAGNOSIS — F17210 Nicotine dependence, cigarettes, uncomplicated: Secondary | ICD-10-CM | POA: Diagnosis not present

## 2024-01-02 LAB — CBC WITH DIFFERENTIAL/PLATELET
Abs Immature Granulocytes: 0.01 10*3/uL (ref 0.00–0.07)
Basophils Absolute: 0 10*3/uL (ref 0.0–0.1)
Basophils Relative: 0 %
Eosinophils Absolute: 0.3 10*3/uL (ref 0.0–0.5)
Eosinophils Relative: 4 %
HCT: 33.5 % — ABNORMAL LOW (ref 36.0–46.0)
Hemoglobin: 10.9 g/dL — ABNORMAL LOW (ref 12.0–15.0)
Immature Granulocytes: 0 %
Lymphocytes Relative: 32 %
Lymphs Abs: 2.2 10*3/uL (ref 0.7–4.0)
MCH: 28.7 pg (ref 26.0–34.0)
MCHC: 32.5 g/dL (ref 30.0–36.0)
MCV: 88.2 fL (ref 80.0–100.0)
Monocytes Absolute: 0.5 10*3/uL (ref 0.1–1.0)
Monocytes Relative: 7 %
Neutro Abs: 3.9 10*3/uL (ref 1.7–7.7)
Neutrophils Relative %: 57 %
Platelets: 124 10*3/uL — ABNORMAL LOW (ref 150–400)
RBC: 3.8 MIL/uL — ABNORMAL LOW (ref 3.87–5.11)
RDW: 14.4 % (ref 11.5–15.5)
WBC: 6.9 10*3/uL (ref 4.0–10.5)
nRBC: 0 % (ref 0.0–0.2)

## 2024-01-02 LAB — COMPREHENSIVE METABOLIC PANEL WITH GFR
ALT: 15 U/L (ref 0–44)
AST: 31 U/L (ref 15–41)
Albumin: 4 g/dL (ref 3.5–5.0)
Alkaline Phosphatase: 85 U/L (ref 38–126)
Anion gap: 12 (ref 5–15)
BUN: 15 mg/dL (ref 6–20)
CO2: 24 mmol/L (ref 22–32)
Calcium: 9 mg/dL (ref 8.9–10.3)
Chloride: 98 mmol/L (ref 98–111)
Creatinine, Ser: 1.07 mg/dL — ABNORMAL HIGH (ref 0.44–1.00)
GFR, Estimated: 60 mL/min — ABNORMAL LOW (ref >60)
Glucose, Bld: 134 mg/dL — ABNORMAL HIGH (ref 70–99)
Potassium: 3.5 mmol/L (ref 3.5–5.1)
Sodium: 134 mmol/L — ABNORMAL LOW (ref 135–145)
Total Bilirubin: 0.2 mg/dL (ref 0.0–1.2)
Total Protein: 7.8 g/dL (ref 6.5–8.1)

## 2024-01-02 MED ORDER — ACETAMINOPHEN 500 MG PO TABS
1000.0000 mg | ORAL_TABLET | Freq: Once | ORAL | Status: AC
  Administered 2024-01-02: 1000 mg via ORAL
  Filled 2024-01-02: qty 2
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
## 2024-01-02 NOTE — ED Triage Notes (Signed)
 Pt reports she fell at home yesterday; c/o pain to RUE and head; hit RUE on door frame and head on hardwood floor; c/o being sore all over; sts she blacked out after the fall

## 2024-01-02 NOTE — Discharge Instructions (Addendum)
 While you were in the emergency room, you had a head CT done that was negative.  Your x-ray of your arm also did not show any broken bones.  You likely just have some deep bruises.  You can take Motrin  and Tylenol  at home.  Follow-up with your primary care doctor.
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
## 2024-01-07 DIAGNOSIS — M19022 Primary osteoarthritis, left elbow: Secondary | ICD-10-CM | POA: Diagnosis not present

## 2024-01-15 ENCOUNTER — Other Ambulatory Visit: Payer: Self-pay | Admitting: Nurse Practitioner

## 2024-01-15 DIAGNOSIS — E039 Hypothyroidism, unspecified: Secondary | ICD-10-CM

## 2024-01-16 DIAGNOSIS — R0789 Other chest pain: Secondary | ICD-10-CM | POA: Diagnosis not present

## 2024-01-16 DIAGNOSIS — R9431 Abnormal electrocardiogram [ECG] [EKG]: Secondary | ICD-10-CM | POA: Diagnosis not present

## 2024-01-16 DIAGNOSIS — R079 Chest pain, unspecified: Secondary | ICD-10-CM | POA: Diagnosis not present

## 2024-01-16 DIAGNOSIS — E785 Hyperlipidemia, unspecified: Secondary | ICD-10-CM | POA: Diagnosis not present

## 2024-01-16 DIAGNOSIS — R531 Weakness: Secondary | ICD-10-CM | POA: Diagnosis not present

## 2024-01-16 DIAGNOSIS — I452 Bifascicular block: Secondary | ICD-10-CM | POA: Diagnosis not present

## 2024-01-16 DIAGNOSIS — R519 Headache, unspecified: Secondary | ICD-10-CM | POA: Diagnosis not present

## 2024-01-16 DIAGNOSIS — I10 Essential (primary) hypertension: Secondary | ICD-10-CM | POA: Diagnosis not present

## 2024-01-16 DIAGNOSIS — I4510 Unspecified right bundle-branch block: Secondary | ICD-10-CM | POA: Diagnosis not present

## 2024-01-16 DIAGNOSIS — Z8669 Personal history of other diseases of the nervous system and sense organs: Secondary | ICD-10-CM | POA: Diagnosis not present

## 2024-01-16 DIAGNOSIS — R42 Dizziness and giddiness: Secondary | ICD-10-CM | POA: Diagnosis not present

## 2024-01-16 DIAGNOSIS — F17210 Nicotine dependence, cigarettes, uncomplicated: Secondary | ICD-10-CM | POA: Diagnosis not present

## 2024-01-27 ENCOUNTER — Encounter: Payer: Self-pay | Admitting: Sports Medicine

## 2024-01-31 ENCOUNTER — Encounter: Payer: Self-pay | Admitting: Infectious Diseases

## 2024-01-31 ENCOUNTER — Encounter (HOSPITAL_COMMUNITY): Payer: Self-pay | Admitting: Psychiatry

## 2024-01-31 ENCOUNTER — Ambulatory Visit: Admitting: Infectious Diseases

## 2024-01-31 ENCOUNTER — Telehealth (INDEPENDENT_AMBULATORY_CARE_PROVIDER_SITE_OTHER): Admitting: Psychiatry

## 2024-01-31 ENCOUNTER — Other Ambulatory Visit (HOSPITAL_COMMUNITY): Payer: Self-pay | Admitting: Psychiatry

## 2024-01-31 ENCOUNTER — Other Ambulatory Visit: Payer: Self-pay

## 2024-01-31 ENCOUNTER — Other Ambulatory Visit: Payer: Self-pay | Admitting: Internal Medicine

## 2024-01-31 VITALS — BP 162/95 | HR 78 | Temp 98.6°F | Wt 233.0 lb

## 2024-01-31 DIAGNOSIS — Z7185 Encounter for immunization safety counseling: Secondary | ICD-10-CM | POA: Diagnosis not present

## 2024-01-31 DIAGNOSIS — F1091 Alcohol use, unspecified, in remission: Secondary | ICD-10-CM

## 2024-01-31 DIAGNOSIS — Z0000 Encounter for general adult medical examination without abnormal findings: Secondary | ICD-10-CM | POA: Diagnosis not present

## 2024-01-31 DIAGNOSIS — B20 Human immunodeficiency virus [HIV] disease: Secondary | ICD-10-CM

## 2024-01-31 DIAGNOSIS — F411 Generalized anxiety disorder: Secondary | ICD-10-CM | POA: Diagnosis not present

## 2024-01-31 DIAGNOSIS — G629 Polyneuropathy, unspecified: Secondary | ICD-10-CM

## 2024-01-31 DIAGNOSIS — F4310 Post-traumatic stress disorder, unspecified: Secondary | ICD-10-CM | POA: Diagnosis not present

## 2024-01-31 DIAGNOSIS — F332 Major depressive disorder, recurrent severe without psychotic features: Secondary | ICD-10-CM | POA: Diagnosis not present

## 2024-01-31 DIAGNOSIS — Z79899 Other long term (current) drug therapy: Secondary | ICD-10-CM | POA: Insufficient documentation

## 2024-01-31 DIAGNOSIS — K7460 Unspecified cirrhosis of liver: Secondary | ICD-10-CM | POA: Diagnosis not present

## 2024-01-31 DIAGNOSIS — Z21 Asymptomatic human immunodeficiency virus [HIV] infection status: Secondary | ICD-10-CM

## 2024-01-31 DIAGNOSIS — F32A Depression, unspecified: Secondary | ICD-10-CM

## 2024-01-31 DIAGNOSIS — Z113 Encounter for screening for infections with a predominantly sexual mode of transmission: Secondary | ICD-10-CM | POA: Insufficient documentation

## 2024-01-31 DIAGNOSIS — F3163 Bipolar disorder, current episode mixed, severe, without psychotic features: Secondary | ICD-10-CM

## 2024-01-31 MED ORDER — QUETIAPINE FUMARATE 100 MG PO TABS: 100.0000 mg | ORAL_TABLET | Freq: Every day | ORAL | 1 refills | Status: DC

## 2024-01-31 MED ORDER — BIKTARVY 50-200-25 MG PO TABS: 1.0000 | ORAL_TABLET | Freq: Every day | ORAL | 11 refills | Status: DC
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
## 2024-02-01 ENCOUNTER — Other Ambulatory Visit: Payer: Self-pay | Admitting: *Deleted

## 2024-02-01 DIAGNOSIS — S060X9D Concussion with loss of consciousness of unspecified duration, subsequent encounter: Secondary | ICD-10-CM

## 2024-02-01 DIAGNOSIS — S7002XD Contusion of left hip, subsequent encounter: Secondary | ICD-10-CM

## 2024-02-01 DIAGNOSIS — S40011D Contusion of right shoulder, subsequent encounter: Secondary | ICD-10-CM

## 2024-02-01 DIAGNOSIS — S7001XD Contusion of right hip, subsequent encounter: Secondary | ICD-10-CM

## 2024-02-01 MED ORDER — CYCLOBENZAPRINE HCL 5 MG PO TABS: 5.0000 mg | ORAL_TABLET | Freq: Every evening | ORAL | 0 refills | Status: AC | PRN

## 2024-02-01 MED ORDER — IBUPROFEN 800 MG PO TABS: ORAL_TABLET | ORAL | 0 refills | Status: AC

## 2024-02-03 LAB — T-HELPER CELLS (CD4) COUNT (NOT AT ARMC)
Absolute CD4: 1286 {cells}/uL (ref 490–1740)
CD4 T Helper %: 48 % (ref 30–61)
Total lymphocyte count: 2657 {cells}/uL (ref 850–3900)

## 2024-02-03 LAB — HIV RNA, RTPCR W/R GT (RTI, PI,INT)
HIV 1 RNA Quant: NOT DETECTED {copies}/mL
HIV-1 RNA Quant, Log: NOT DETECTED {Log_copies}/mL

## 2024-02-03 LAB — RPR: RPR Ser Ql: NONREACTIVE

## 2024-02-04 ENCOUNTER — Ambulatory Visit: Payer: Self-pay | Admitting: Infectious Diseases

## 2024-02-05 ENCOUNTER — Ambulatory Visit (INDEPENDENT_AMBULATORY_CARE_PROVIDER_SITE_OTHER): Payer: Self-pay | Admitting: Nurse Practitioner

## 2024-02-05 ENCOUNTER — Encounter: Payer: Self-pay | Admitting: Nurse Practitioner

## 2024-02-05 VITALS — BP 164/91 | HR 69 | Temp 97.2°F | Wt 233.0 lb

## 2024-02-05 DIAGNOSIS — I10 Essential (primary) hypertension: Secondary | ICD-10-CM

## 2024-02-05 DIAGNOSIS — E039 Hypothyroidism, unspecified: Secondary | ICD-10-CM | POA: Diagnosis not present

## 2024-02-05 DIAGNOSIS — E785 Hyperlipidemia, unspecified: Secondary | ICD-10-CM | POA: Diagnosis not present

## 2024-02-05 DIAGNOSIS — R079 Chest pain, unspecified: Secondary | ICD-10-CM | POA: Insufficient documentation

## 2024-02-05 DIAGNOSIS — E1165 Type 2 diabetes mellitus with hyperglycemia: Secondary | ICD-10-CM | POA: Diagnosis not present

## 2024-02-05 DIAGNOSIS — Z09 Encounter for follow-up examination after completed treatment for conditions other than malignant neoplasm: Secondary | ICD-10-CM

## 2024-02-05 DIAGNOSIS — E781 Pure hyperglyceridemia: Secondary | ICD-10-CM | POA: Diagnosis not present

## 2024-02-05 DIAGNOSIS — F17200 Nicotine dependence, unspecified, uncomplicated: Secondary | ICD-10-CM | POA: Diagnosis not present

## 2024-02-05 DIAGNOSIS — W19XXXD Unspecified fall, subsequent encounter: Secondary | ICD-10-CM

## 2024-02-05 DIAGNOSIS — Z1231 Encounter for screening mammogram for malignant neoplasm of breast: Secondary | ICD-10-CM

## 2024-02-05 DIAGNOSIS — M5416 Radiculopathy, lumbar region: Secondary | ICD-10-CM | POA: Diagnosis not present

## 2024-02-05 LAB — POCT GLYCOSYLATED HEMOGLOBIN (HGB A1C): Hemoglobin A1C: 6.4 % — AB (ref 4.0–5.6)

## 2024-02-05 MED ORDER — EZETIMIBE 10 MG PO TABS: 10.0000 mg | ORAL_TABLET | Freq: Every day | ORAL | 3 refills | Status: AC

## 2024-02-05 MED ORDER — ICOSAPENT ETHYL 1 G PO CAPS: 2.0000 g | ORAL_CAPSULE | Freq: Two times a day (BID) | ORAL | 1 refills | Status: DC

## 2024-02-05 MED ORDER — HYDROCHLOROTHIAZIDE 25 MG PO TABS: 25.0000 mg | ORAL_TABLET | Freq: Every day | ORAL | 2 refills | Status: DC

## 2024-02-05 MED ORDER — AMLODIPINE BESYLATE 5 MG PO TABS: 5.0000 mg | ORAL_TABLET | Freq: Every day | ORAL | 1 refills | Status: AC

## 2024-02-05 NOTE — Assessment & Plan Note (Signed)
 She has not taking both medications today has a blood pressure monitor at home Patient will return for a nurse visit for blood pressure check in 2 weeks We will add an ACE or ARB if blood pressure remains uncontrolled DASH diet and commitment to daily physical activity for a minimum of 30 minutes discussed and encouraged, as a part of hypertension management. The importance of attaining a healthy weight is also discussed.     02/05/2024    2:36 PM 02/05/2024    2:32 PM 01/31/2024   10:58 AM 01/02/2024    3:00 PM 01/02/2024    1:50 PM 01/02/2024    1:49 PM 12/19/2023   12:01 PM  BP/Weight  Systolic BP 164 173 162 126  131 183  Diastolic BP 91 88 95 82  87 96  Wt. (Lbs)  233 233  229.28    BMI  34.41 kg/m2 34.41 kg/m2  33.86 kg/m2

## 2024-02-05 NOTE — Assessment & Plan Note (Signed)
 Encouraged to keep upcoming appointment with cardiology

## 2024-02-05 NOTE — Assessment & Plan Note (Addendum)
 Lab Results  Component Value Date   HGBA1C 6.4 (A) 02/05/2024  Well-controlled on metformin  500 mg twice daily Continue current medication Patient counseled on low-carb diet Up-to-date with diabetic eye exam recently, Stated that she has started walking more, and follows a low-carb diet We discussed starting a GLP-1 once her triglycerides is better Patient denies personal or family history of MTC or MEN 2.  They denied personal history of pancreatitis.
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
## 2024-02-05 NOTE — Assessment & Plan Note (Signed)
 Hospital chart reviewed, including discharge summary Medications reconciled and reviewed with the patient in detail

## 2024-02-05 NOTE — Assessment & Plan Note (Signed)
 Continues to smoke daily Cessation encouraged

## 2024-02-05 NOTE — Assessment & Plan Note (Addendum)
 Checking labs Stated that she is on levothyroxine  137 mcg daily but 112 mcg tablet was dispensed the past 2 months

## 2024-02-05 NOTE — Assessment & Plan Note (Signed)
 Followed by psychiatrist Continue hydroxyzine  25 mg as needed, Seroquel  100 mg at bedtime, duloxetine  60 mg daily

## 2024-02-05 NOTE — Patient Instructions (Addendum)
 [facility] please call to make an appointment for further evaluation of your frequent falls [address], [place], [place] [address] Phone: [phone]   Around 3 times per week, check your blood pressure 2 times per day. once in the morning and once in the evening. The readings should be at least one minute apart. Write down these values and bring them to your next nurse visit/appointment.  When you check your BP, make sure you have been doing something calm/relaxing 5 minutes prior to checking. Both feet should be flat on the floor and you should be sitting. Use your left arm and make sure it is in a relaxed position (on a table), and that the cuff is at the approximate level/height of your heart. Blood pressure goal is less than 130/80   Please consider getting Shingrix and  pneumonia vaccine (PCV 20 vaccine) at local pharmacy.    It is important that you exercise regularly at least 30 minutes 5 times a week as tolerated  Think about what you will eat, plan ahead. Choose  clean, green, fresh or frozen over canned, processed or packaged foods which are more sugary, salty and fatty. 70 to 75% of food eaten should be vegetables and fruit. Three meals at set times with snacks allowed between meals, but they must be fruit or vegetables. Aim to eat over a 12 hour period , example 7 am to 7 pm, and STOP after  your last meal of the day. Drink water,generally about 64 ounces per day, no other drink is as healthy. Fruit juice is best enjoyed in a healthy way, by EATING the fruit.  Thanks for choosing [facility] we consider it a privelige to serve you.

## 2024-02-05 NOTE — Assessment & Plan Note (Signed)
 Continue duloxetine  60 mg daily, ibuprofen  800 mg 3 times daily as needed, Flexeril  5 mg at bedtime as needed

## 2024-02-05 NOTE — Assessment & Plan Note (Addendum)
 Lab Results  Component Value Date   CHOL 212 (H) 10/18/2023   HDL 21 (L) 10/18/2023   LDLCALC 80 10/18/2023   LDLDIRECT 70 04/22/2023   TRIG 691 (HH) 10/18/2023   CHOLHDL 10.1 (H) 10/18/2023  Plans to restart Zetia  10 mg daily and Vascepa  2 g twice daily Avoid fatty fried foods, lose weight

## 2024-02-05 NOTE — Assessment & Plan Note (Addendum)
 Advised to follow-up with ENT as planned, their phone number provided Fall prevention education completed Continue use of a cane

## 2024-02-06 LAB — LIPID PANEL
Chol/HDL Ratio: 7.6 ratio — ABNORMAL HIGH (ref 0.0–4.4)
Cholesterol, Total: 197 mg/dL (ref 100–199)
HDL: 26 mg/dL — ABNORMAL LOW (ref >39)
LDL Chol Calc (NIH): 113 mg/dL — ABNORMAL HIGH (ref 0–99)
Triglycerides: 332 mg/dL — ABNORMAL HIGH (ref 0–149)
VLDL Cholesterol Cal: 58 mg/dL — ABNORMAL HIGH (ref 5–40)

## 2024-02-06 LAB — MICROALBUMIN / CREATININE URINE RATIO
Creatinine, Urine: 70.2 mg/dL
Microalb/Creat Ratio: 7 mg/g{creat} (ref 0–29)
Microalbumin, Urine: 5.1 ug/mL

## 2024-02-06 LAB — TSH+FREE T4
Free T4: 1.02 ng/dL (ref 0.82–1.77)
TSH: 2.34 u[IU]/mL (ref 0.450–4.500)

## 2024-02-07 ENCOUNTER — Ambulatory Visit: Payer: Self-pay | Admitting: Nurse Practitioner

## 2024-02-07 ENCOUNTER — Other Ambulatory Visit: Payer: Self-pay | Admitting: Nurse Practitioner

## 2024-02-07 DIAGNOSIS — E039 Hypothyroidism, unspecified: Secondary | ICD-10-CM

## 2024-02-07 DIAGNOSIS — E781 Pure hyperglyceridemia: Secondary | ICD-10-CM

## 2024-02-07 MED ORDER — LEVOTHYROXINE SODIUM 137 MCG PO TABS: 137.0000 ug | ORAL_TABLET | Freq: Every day | ORAL | 0 refills | Status: DC

## 2024-02-07 MED ORDER — ICOSAPENT ETHYL 1 G PO CAPS: 2.0000 g | ORAL_CAPSULE | Freq: Two times a day (BID) | ORAL | 3 refills | Status: AC

## 2024-02-17 DIAGNOSIS — R0602 Shortness of breath: Secondary | ICD-10-CM | POA: Diagnosis not present

## 2024-02-17 DIAGNOSIS — E785 Hyperlipidemia, unspecified: Secondary | ICD-10-CM | POA: Diagnosis not present

## 2024-02-17 DIAGNOSIS — I10 Essential (primary) hypertension: Secondary | ICD-10-CM | POA: Diagnosis not present

## 2024-02-17 DIAGNOSIS — R072 Precordial pain: Secondary | ICD-10-CM | POA: Diagnosis not present

## 2024-02-18 ENCOUNTER — Ambulatory Visit: Payer: Self-pay | Admitting: Nurse Practitioner

## 2024-02-19 ENCOUNTER — Ambulatory Visit: Payer: Self-pay

## 2024-02-24 ENCOUNTER — Other Ambulatory Visit (HOSPITAL_COMMUNITY): Payer: Self-pay | Admitting: Psychiatry

## 2024-02-24 ENCOUNTER — Telehealth: Payer: Self-pay

## 2024-02-24 ENCOUNTER — Other Ambulatory Visit: Payer: Self-pay | Admitting: Internal Medicine

## 2024-02-24 DIAGNOSIS — I340 Nonrheumatic mitral (valve) insufficiency: Secondary | ICD-10-CM | POA: Diagnosis not present

## 2024-02-24 DIAGNOSIS — F332 Major depressive disorder, recurrent severe without psychotic features: Secondary | ICD-10-CM

## 2024-02-24 DIAGNOSIS — R0602 Shortness of breath: Secondary | ICD-10-CM | POA: Diagnosis not present

## 2024-02-24 DIAGNOSIS — R072 Precordial pain: Secondary | ICD-10-CM | POA: Diagnosis not present

## 2024-02-24 NOTE — Telephone Encounter (Signed)
 Denied refill montelukast  at [facility] in [place]. Patient last seen 12/2022 by Dr Lorin needs visit.

## 2024-02-25 DIAGNOSIS — R072 Precordial pain: Secondary | ICD-10-CM | POA: Diagnosis not present

## 2024-02-25 DIAGNOSIS — R0602 Shortness of breath: Secondary | ICD-10-CM | POA: Diagnosis not present

## 2024-03-04 ENCOUNTER — Other Ambulatory Visit: Payer: Self-pay | Admitting: Nurse Practitioner

## 2024-03-04 ENCOUNTER — Other Ambulatory Visit: Payer: Self-pay | Admitting: Internal Medicine

## 2024-03-04 DIAGNOSIS — Z21 Asymptomatic human immunodeficiency virus [HIV] infection status: Secondary | ICD-10-CM

## 2024-03-04 DIAGNOSIS — I10 Essential (primary) hypertension: Secondary | ICD-10-CM

## 2024-03-18 ENCOUNTER — Other Ambulatory Visit: Payer: Self-pay

## 2024-03-18 ENCOUNTER — Other Ambulatory Visit: Payer: Self-pay | Admitting: Nurse Practitioner

## 2024-03-18 DIAGNOSIS — Z21 Asymptomatic human immunodeficiency virus [HIV] infection status: Secondary | ICD-10-CM

## 2024-03-18 MED ORDER — BIKTARVY 50-200-25 MG PO TABS: 1.0000 | ORAL_TABLET | Freq: Every day | ORAL | 5 refills | Status: DC

## 2024-03-18 NOTE — Telephone Encounter (Unsigned)
 Copied from CRM [id number]. Topic: Clinical - Medication Refill >> Mar 18, 2024  3:57 PM Amy B wrote: Medication: hydrochlorothiazide  (HYDRODIURIL ) 25 MG tablet  Has the patient contacted their pharmacy? Yes (Agent: If no, request that the patient contact the pharmacy for the refill. If patient does not wish to contact the pharmacy document the reason why and proceed with request.) (Agent: If yes, when and what did the pharmacy advise?)  This is the patient's preferred pharmacy:   [facility] [place] [address] [place] [address] Phone: [phone] Fax: [phone]  Is this the correct pharmacy for this prescription? Yes If no, delete pharmacy and type the correct one.   Has the prescription been filled recently? No  Is the patient out of the medication? Yes  Has the patient been seen for an appointment in the last year OR does the patient have an upcoming appointment? Yes  Can we respond through MyChart? Yes  Agent: Please be advised that Rx refills may take up to 3 business days. We ask that you follow-up with your pharmacy.

## 2024-03-18 NOTE — Progress Notes (Signed)
 Patient fills with [facility], not [facility]. Rx sent to correct pharmacy.   Jacquese Cassarino, BSN, RN

## 2024-03-23 ENCOUNTER — Ambulatory Visit: Payer: Self-pay | Admitting: Nurse Practitioner

## 2024-03-24 ENCOUNTER — Other Ambulatory Visit: Payer: Self-pay | Admitting: Internal Medicine

## 2024-03-31 ENCOUNTER — Other Ambulatory Visit (HOSPITAL_COMMUNITY): Payer: Self-pay | Admitting: Psychiatry

## 2024-03-31 DIAGNOSIS — F332 Major depressive disorder, recurrent severe without psychotic features: Secondary | ICD-10-CM

## 2024-04-23 ENCOUNTER — Other Ambulatory Visit: Payer: Self-pay | Admitting: Internal Medicine

## 2024-04-27 ENCOUNTER — Telehealth: Payer: Self-pay | Admitting: Internal Medicine

## 2024-04-27 NOTE — Telephone Encounter (Signed)
 PHARMACY REQUEST REFILL FOR MONTELUKAST  10MG .

## 2024-04-27 NOTE — Telephone Encounter (Signed)
 Rx denied on 04/23/24-pt needs appt. Notified pt via mychart .

## 2024-04-28 DIAGNOSIS — G5602 Carpal tunnel syndrome, left upper limb: Secondary | ICD-10-CM | POA: Diagnosis not present

## 2024-04-28 DIAGNOSIS — M19022 Primary osteoarthritis, left elbow: Secondary | ICD-10-CM | POA: Diagnosis not present

## 2024-04-29 ENCOUNTER — Ambulatory Visit

## 2024-05-01 ENCOUNTER — Ambulatory Visit: Payer: Self-pay | Admitting: Nurse Practitioner

## 2024-05-01 ENCOUNTER — Telehealth: Admitting: Physician Assistant

## 2024-05-01 DIAGNOSIS — J208 Acute bronchitis due to other specified organisms: Secondary | ICD-10-CM | POA: Diagnosis not present

## 2024-05-01 DIAGNOSIS — B9689 Other specified bacterial agents as the cause of diseases classified elsewhere: Secondary | ICD-10-CM

## 2024-05-01 MED ORDER — AZITHROMYCIN 250 MG PO TABS: ORAL_TABLET | ORAL | 0 refills | Status: AC

## 2024-05-01 NOTE — Patient Instructions (Signed)
 Cheryl Thompson, thank you for joining Delon CHRISTELLA Dickinson, PA-C for today's virtual visit.  While this provider is not your primary care provider (PCP), if your PCP is located in our provider database this encounter information will be shared with them immediately following your visit.   A [facility] MyChart account gives you access to today's visit and all your visits, tests, and labs performed at [facility]  click here if you don't have a [facility] MyChart account or go to [email or web address]  Consent: (Patient) Cheryl Thompson provided verbal consent for this virtual visit at the beginning of the encounter.  Current Medications:  Current Outpatient Medications:    azithromycin  (ZITHROMAX ) 250 MG tablet, Take 2 tablets on day 1, then 1 tablet daily on days 2 through 5, Disp: 6 tablet, Rfl: 0   predniSONE  (DELTASONE ) 20 MG tablet, Take 2 tablets (40 mg total) by mouth daily with breakfast., Disp: 10 tablet, Rfl: 0   promethazine -dextromethorphan (PROMETHAZINE -DM) 6.25-15 MG/5ML syrup, Take 5 mLs by mouth 4 (four) times daily as needed., Disp: 118 mL, Rfl: 0   albuterol  (VENTOLIN  HFA) 108 (90 Base) MCG/ACT inhaler, Inhale 2 puffs into the lungs every 6 (six) hours as needed for wheezing or shortness of breath., Disp: 18 g, Rfl: 3   amLODipine  (NORVASC ) 5 MG tablet, Take 1 tablet (5 mg total) by mouth daily., Disp: 90 tablet, Rfl: 1   Azelastine -Fluticasone  137-50 MCG/ACT SUSP, Place 1 spray into the nose in the morning and at bedtime., Disp: 23 g, Rfl: 5   bictegravir-emtricitabine -tenofovir  AF (BIKTARVY ) 50-200-25 MG TABS tablet, Take 1 tablet by mouth daily., Disp: 30 tablet, Rfl: 5   cetirizine  (ZYRTEC ) 10 MG tablet, Take 1 tablet (10 mg total) by mouth daily., Disp: 90 tablet, Rfl: 1   cyclobenzaprine  (FLEXERIL ) 5 MG tablet, Take 1 tablet (5 mg total) by mouth at bedtime as needed for muscle spasms., Disp: 30 tablet, Rfl: 0   DULoxetine  (CYMBALTA ) 60 MG capsule,  TAKE ONE CAPSULE BY MOUTH DAILY AT 9AM, Disp: 90 capsule, Rfl: 2   Evolocumab  (REPATHA  SURECLICK) 140 MG/ML SOAJ, Inject 140 mg into the skin every 14 (fourteen) days. (Patient not taking: Reported on 02/05/2024), Disp: 2 mL, Rfl: 2   ezetimibe  (ZETIA ) 10 MG tablet, Take 1 tablet (10 mg total) by mouth daily., Disp: 90 tablet, Rfl: 3   fluticasone  (FLONASE ) 50 MCG/ACT nasal spray, Place 2 sprays into both nostrils daily. (Patient not taking: Reported on 02/05/2024), Disp: 16 g, Rfl: 6   hydrochlorothiazide  (HYDRODIURIL ) 25 MG tablet, TAKE ONE TABLET BY MOUTH DAILY AT 9AM FOR HIGH BLOOD PRESSURE, Disp: 90 tablet, Rfl: 1   hydrOXYzine  (ATARAX ) 25 MG tablet, TAKE 1 TABLET BY MOUTH ONCE DAILY AS NEEDED FOR ANXIETY, Disp: 30 tablet, Rfl: 0   hyoscyamine  (LEVSIN  SL) 0.125 MG SL tablet, Place 1 tablet (0.125 mg total) under the tongue every 4 (four) hours as needed., Disp: 90 tablet, Rfl: 1   ibuprofen  (ADVIL ) 800 MG tablet, Take one tab three times a day with food for pain, Disp: 60 tablet, Rfl: 0   icosapent  Ethyl (VASCEPA ) 1 g capsule, Take 2 capsules (2 g total) by mouth 2 (two) times daily., Disp: 180 capsule, Rfl: 3   ipratropium (ATROVENT ) 0.06 % nasal spray, USE 2 SPRAYS IN EACH NOSTRIL FOUR TIMES DAILY (Patient not taking: Reported on 02/05/2024), Disp: 15 mL, Rfl: 0   levothyroxine  (SYNTHROID ) 137 MCG tablet, Take 1 tablet (137 mcg total) by mouth daily before breakfast., Disp:  90 tablet, Rfl: 0   meloxicam  (MOBIC ) 15 MG tablet, Take 1 tablet by mouth once daily for 30 days (Patient not taking: Reported on 02/05/2024), Disp: , Rfl:    metFORMIN  (GLUCOPHAGE ) 500 MG tablet, TAKE ONE TABLET BY MOUTH TWICE DAILY @9AM  5PM WITH MEALS, Disp: 160 tablet, Rfl: 11   montelukast  (SINGULAIR ) 10 MG tablet, TAKE ONE TABLET (10MG  TOTAL) BY MOUTH DAILY AT 9PM AT BEDTIME, Disp: 90 tablet, Rfl: 0   QUEtiapine  (SEROQUEL ) 100 MG tablet, TAKE ONE TABLET BY MOUTH DAILY AT 9PM AT BEDTIME, Disp: 30 tablet, Rfl: 1    Medications ordered in this encounter:  Meds ordered this encounter  Medications   azithromycin  (ZITHROMAX ) 250 MG tablet    Sig: Take 2 tablets on day 1, then 1 tablet daily on days 2 through 5    Dispense:  6 tablet    Refill:  0    Supervising Provider:   LAMPTEY, PHILIP O [[id number]]   predniSONE  (DELTASONE ) 20 MG tablet    Sig: Take 2 tablets (40 mg total) by mouth daily with breakfast.    Dispense:  10 tablet    Refill:  0    Supervising Provider:   LAMPTEY, PHILIP O [[id number]]   promethazine -dextromethorphan (PROMETHAZINE -DM) 6.25-15 MG/5ML syrup    Sig: Take 5 mLs by mouth 4 (four) times daily as needed.    Dispense:  118 mL    Refill:  0    Supervising Provider:   BLAISE ALEENE KIDD [[id number]]     *If you need refills on other medications prior to your next appointment, please contact your pharmacy*  Follow-Up: Call back or seek an in-person evaluation if the symptoms worsen or if the condition fails to improve as anticipated.  [facility] [phone]  Other Instructions Acute Bronchitis, Adult  Acute bronchitis is sudden inflammation of the main airways (bronchi) that come off the windpipe (trachea) in the lungs. The swelling causes the airways to get smaller and make more mucus than normal. This can make it hard to breathe and can cause coughing or noisy breathing (wheezing). Acute bronchitis may last several weeks. The cough may last longer. Allergies, asthma, and exposure to smoke may make the condition worse. What are the causes? This condition can be caused by germs and by substances that irritate the lungs, including: Cold and flu viruses. The most common cause of this condition is the virus that causes the common cold. Bacteria. This is less common. Breathing in substances that irritate the lungs, including: Smoke from cigarettes and other forms of tobacco. Dust and pollen. Fumes from household cleaning products, gases, or burned fuel. Indoor or  outdoor air pollution. What increases the risk? The following factors may make you more likely to develop this condition: A weak body's defense system, also called the immune system. A condition that affects your lungs and breathing, such as asthma. What are the signs or symptoms? Common symptoms of this condition include: Coughing. This may bring up clear, yellow, or green mucus from your lungs (sputum). Wheezing. Runny or stuffy nose. Having too much mucus in your lungs (chest congestion). Shortness of breath. Aches and pains, including sore throat or chest. How is this diagnosed? This condition is usually diagnosed based on: Your symptoms and medical history. A physical exam. You may also have other tests, including tests to rule out other conditions, such as pneumonia. These tests include: A test of lung function. Test of a mucus sample to look for the  presence of bacteria. Tests to check the oxygen level in your blood. Blood tests. Chest X-ray. How is this treated? Most cases of acute bronchitis clear up over time without treatment. Your health care provider may recommend: Drinking more fluids to help thin your mucus so it is easier to cough up. Taking inhaled medicine (inhaler) to improve air flow in and out of your lungs. Using a vaporizer or a humidifier. These are machines that add water to the air to help you breathe better. Taking a medicine that thins mucus and clears congestion (expectorant). Taking a medicine that prevents or stops coughing (cough suppressant). It is not common to take an antibiotic medicine for this condition. Follow these instructions at home:  Take over-the-counter and prescription medicines only as told by your health care provider. Use an inhaler, vaporizer, or humidifier as told by your health care provider. Take two teaspoons (10 mL) of honey at bedtime to lessen coughing at night. Drink enough fluid to keep your urine pale yellow. Do not use  any products that contain nicotine  or tobacco. These products include cigarettes, chewing tobacco, and vaping devices, such as e-cigarettes. If you need help quitting, ask your health care provider. Get plenty of rest. Return to your normal activities as told by your health care provider. Ask your health care provider what activities are safe for you. Keep all follow-up visits. This is important. How is this prevented? To lower your risk of getting this condition again: Wash your hands often with soap and water for at least 20 seconds. If soap and water are not available, use hand sanitizer. Avoid contact with people who have cold symptoms. Try not to touch your mouth, nose, or eyes with your hands. Avoid breathing in smoke or chemical fumes. Breathing smoke or chemical fumes will make your condition worse. Get the flu shot every year. Contact a health care provider if: Your symptoms do not improve after 2 weeks. You have trouble coughing up the mucus. Your cough keeps you awake at night. You have a fever. Get help right away if you: Cough up blood. Feel pain in your chest. Have severe shortness of breath. Faint or keep feeling like you are going to faint. Have a severe headache. Have a fever or chills that get worse. These symptoms may represent a serious problem that is an emergency. Do not wait to see if the symptoms will go away. Get medical help right away. Call your local emergency services (911 in the [place] Do not drive yourself to the hospital. Summary Acute bronchitis is inflammation of the main airways (bronchi) that come off the windpipe (trachea) in the lungs. The swelling causes the airways to get smaller and make more mucus than normal. Drinking more fluids can help thin your mucus so it is easier to cough up. Take over-the-counter and prescription medicines only as told by your health care provider. Do not use any products that contain nicotine  or tobacco. These products  include cigarettes, chewing tobacco, and vaping devices, such as e-cigarettes. If you need help quitting, ask your health care provider. Contact a health care provider if your symptoms do not improve after 2 weeks. This information is not intended to replace advice given to you by your health care provider. Make sure you discuss any questions you have with your health care provider. Document Revised: 10/12/2021 Document Reviewed: 11/02/2020 Elsevier Patient Education  2024 [employer].   If you have been instructed to have an in-person evaluation today at a  local Urgent Care facility, please use the link below. It will take you to a list of all of our available [facility], including address, phone number and hours of operation. Please do not delay care.  [facility]  If you or a family member do not have a primary care provider, use the link below to schedule a visit and establish care. When you choose a [facility] primary care physician or advanced practice provider, you gain a long-term partner in health. Find a Primary Care Provider  Learn more about [facility]'s in-office and virtual care options: [facility] - Get Care Now
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
## 2024-05-03 DIAGNOSIS — Z888 Allergy status to other drugs, medicaments and biological substances status: Secondary | ICD-10-CM | POA: Diagnosis not present

## 2024-05-03 DIAGNOSIS — R918 Other nonspecific abnormal finding of lung field: Secondary | ICD-10-CM | POA: Diagnosis not present

## 2024-05-03 DIAGNOSIS — Z79899 Other long term (current) drug therapy: Secondary | ICD-10-CM | POA: Diagnosis not present

## 2024-05-03 DIAGNOSIS — R0602 Shortness of breath: Secondary | ICD-10-CM | POA: Diagnosis not present

## 2024-05-03 DIAGNOSIS — Z8673 Personal history of transient ischemic attack (TIA), and cerebral infarction without residual deficits: Secondary | ICD-10-CM | POA: Diagnosis not present

## 2024-05-03 DIAGNOSIS — Z881 Allergy status to other antibiotic agents status: Secondary | ICD-10-CM | POA: Diagnosis not present

## 2024-05-03 DIAGNOSIS — Z96652 Presence of left artificial knee joint: Secondary | ICD-10-CM | POA: Diagnosis not present

## 2024-05-03 DIAGNOSIS — Z9889 Other specified postprocedural states: Secondary | ICD-10-CM | POA: Diagnosis not present

## 2024-05-03 DIAGNOSIS — M1990 Unspecified osteoarthritis, unspecified site: Secondary | ICD-10-CM | POA: Diagnosis not present

## 2024-05-03 DIAGNOSIS — F17210 Nicotine dependence, cigarettes, uncomplicated: Secondary | ICD-10-CM | POA: Diagnosis not present

## 2024-05-03 DIAGNOSIS — Z886 Allergy status to analgesic agent status: Secondary | ICD-10-CM | POA: Diagnosis not present

## 2024-05-03 DIAGNOSIS — R059 Cough, unspecified: Secondary | ICD-10-CM | POA: Diagnosis not present

## 2024-05-03 DIAGNOSIS — K5790 Diverticulosis of intestine, part unspecified, without perforation or abscess without bleeding: Secondary | ICD-10-CM | POA: Diagnosis not present

## 2024-05-03 DIAGNOSIS — Z860100 Personal history of colon polyps, unspecified: Secondary | ICD-10-CM | POA: Diagnosis not present

## 2024-05-03 DIAGNOSIS — Z885 Allergy status to narcotic agent status: Secondary | ICD-10-CM | POA: Diagnosis not present

## 2024-05-03 DIAGNOSIS — E785 Hyperlipidemia, unspecified: Secondary | ICD-10-CM | POA: Diagnosis not present

## 2024-05-03 DIAGNOSIS — Z880 Allergy status to penicillin: Secondary | ICD-10-CM | POA: Diagnosis not present

## 2024-05-03 DIAGNOSIS — Z87892 Personal history of anaphylaxis: Secondary | ICD-10-CM | POA: Diagnosis not present

## 2024-05-03 DIAGNOSIS — I10 Essential (primary) hypertension: Secondary | ICD-10-CM | POA: Diagnosis not present

## 2024-05-05 ENCOUNTER — Ambulatory Visit: Payer: Self-pay

## 2024-05-05 NOTE — Telephone Encounter (Signed)
 FYI Only or Action Required?: FYI only for provider.  Patient was last seen in primary care on 05/01/2024 by Vivienne Delon HERO, PA-C.  Called Nurse Triage reporting Pneumonia.  Symptoms began a week ago.  Interventions attempted: Prescription medications: Levaquin , Tessalon  and Rest, hydration, or home remedies.  Symptoms are: gradually improving.  Triage Disposition: Home Care  Patient/caregiver understands and will follow disposition?: Yes   Copied from CRM #[id number]. Topic: Clinical - Medical Advice >> May 05, 2024  8:22 AM Avram MATSU wrote: Reason for CRM: patient has pneumonia and would like to know how long it last or what she should do as in a follow up? Please  advise [phone] Reason for Disposition  Reasonable improvement on antibiotic  Answer Assessment - Initial Assessment Questions 1. SYMPTOM: What's the main symptom you're concerned about? (e.g., breathing difficulty, fever, weakness)     Low energy  2. ONSET: When did the symptoms start?     1.5 weeks ago  3. BETTER-SAME-WORSE: Are you getting better, staying the same, or getting worse compared to the day you were last seen?     Better.  4. BREATHING DIFFICULTY: Are you having any difficulty breathing? If Yes, ask: How bad is it?  (e.g., none, mild, moderate, severe)      Intermittent shortness of breath with exertion is improving  5. FEVER: Do you have a fever? If Yes, ask: What is your temperature, how was it measured, and when did it start?     Denies  6. SPUTUM: Describe the color of your sputum (clear, white, yellow, green, blood-tinged)      7. DIAGNOSIS CONFIRMATION: When was the pneumonia diagnosed? By whom?     [facility] ER 8. ANTIBIOTIC: Are you taking an antibiotic?  If Yes, ask: Which one? When was it started?     Levaquin  x 5 days  9. OTHER TREATMENT: Are you receiving any other treatment for the pneumonia? (e.g., albuterol  nebulizer, oxygen) If Yes, ask: How often? and  Does it help?     Tessalon -from previous rx 10. HOSPITAL ADMISSION: Were you hospitalized for this pneumonia? If Yes, ask: When were you discharged home from the hospital?       ER on 05/03/24 11. O2 SATURATION MONITOR:  Do you use an oxygen saturation monitor (pulse oximeter) at home? If Yes, What is your reading (oxygen level) today? What is your usual oxygen saturation reading? (e.g., 95%)  Protocols used: Pneumonia Follow-up Call-A-AH

## 2024-05-06 ENCOUNTER — Encounter (HOSPITAL_COMMUNITY): Payer: Self-pay | Admitting: Psychiatry

## 2024-05-06 ENCOUNTER — Telehealth (HOSPITAL_COMMUNITY): Admitting: Psychiatry

## 2024-05-06 DIAGNOSIS — F3163 Bipolar disorder, current episode mixed, severe, without psychotic features: Secondary | ICD-10-CM | POA: Diagnosis not present

## 2024-05-06 DIAGNOSIS — G629 Polyneuropathy, unspecified: Secondary | ICD-10-CM

## 2024-05-06 DIAGNOSIS — F411 Generalized anxiety disorder: Secondary | ICD-10-CM

## 2024-05-06 DIAGNOSIS — F1090 Alcohol use, unspecified, uncomplicated: Secondary | ICD-10-CM

## 2024-05-06 DIAGNOSIS — F4310 Post-traumatic stress disorder, unspecified: Secondary | ICD-10-CM | POA: Diagnosis not present

## 2024-05-06 DIAGNOSIS — F332 Major depressive disorder, recurrent severe without psychotic features: Secondary | ICD-10-CM

## 2024-05-06 MED ORDER — QUETIAPINE FUMARATE 100 MG PO TABS: 100.0000 mg | ORAL_TABLET | Freq: Every day | ORAL | 1 refills | Status: DC

## 2024-05-06 MED ORDER — DULOXETINE HCL 60 MG PO CPEP: 60.0000 mg | ORAL_CAPSULE | Freq: Every day | ORAL | 0 refills | Status: DC
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
## 2024-05-06 NOTE — Telephone Encounter (Signed)
**Note De-identified  [place] Obfuscation** Please advise 

## 2024-05-10 ENCOUNTER — Other Ambulatory Visit: Payer: Self-pay | Admitting: Nurse Practitioner

## 2024-05-10 DIAGNOSIS — E039 Hypothyroidism, unspecified: Secondary | ICD-10-CM

## 2024-05-11 ENCOUNTER — Telehealth: Payer: Self-pay

## 2024-05-11 NOTE — Telephone Encounter (Signed)
 Sent my chart message to find out if this medication is needed before her appointment tomorrow. KH

## 2024-05-11 NOTE — Telephone Encounter (Signed)
 Copied from CRM [id number]. Topic: Clinical - Request for Lab/Test Order >> May 11, 2024  2:47 PM Joesph NOVAK wrote: Reason for CRM: Patient would like to see if her provider will send a xray order to [facility]. She does not have transportation to her appt tomorrow.  FAX: [phone]  PH: [phone]  Pt was called back and made an virtual appt. KH

## 2024-05-12 ENCOUNTER — Telehealth: Admitting: Nurse Practitioner

## 2024-05-12 ENCOUNTER — Inpatient Hospital Stay: Admitting: Nurse Practitioner

## 2024-05-12 DIAGNOSIS — I10 Essential (primary) hypertension: Secondary | ICD-10-CM | POA: Diagnosis not present

## 2024-05-12 DIAGNOSIS — Z09 Encounter for follow-up examination after completed treatment for conditions other than malignant neoplasm: Secondary | ICD-10-CM

## 2024-05-12 DIAGNOSIS — S161XXD Strain of muscle, fascia and tendon at neck level, subsequent encounter: Secondary | ICD-10-CM

## 2024-05-12 DIAGNOSIS — J189 Pneumonia, unspecified organism: Secondary | ICD-10-CM | POA: Diagnosis not present

## 2024-05-12 DIAGNOSIS — R051 Acute cough: Secondary | ICD-10-CM | POA: Diagnosis not present

## 2024-05-12 MED ORDER — BENZONATATE 200 MG PO CAPS: 200.0000 mg | ORAL_CAPSULE | Freq: Three times a day (TID) | ORAL | 0 refills | Status: AC | PRN

## 2024-05-12 NOTE — Assessment & Plan Note (Signed)
  Blood pressure high. Unclear medication adherence. Emphasized importance of adherence. - Ensure adherence to hydrochlorothiazide  25 mg daily and amlodipine  5 mg daily. - Advise heart-healthy, low-salt, low-fat diet. - Encourage exercise, at least 30 minutes daily. - Monitor blood pressure at home, bring readings to next appointment.

## 2024-05-12 NOTE — Assessment & Plan Note (Signed)
 Hospital chart reviewed, including discharge summary Medications reconciled and reviewed with the patient in detail

## 2024-05-12 NOTE — Progress Notes (Signed)
 Virtual Visit via video note  I connected with Cheryl Thompson @ on 05/12/24 at  9:00 AM EDT by video and verified that I am speaking with the correct person using two identifiers. I spent  8 minutes on the video encounter  Location: Patient: home Provider: office   I discussed the limitations, risks, security and privacy concerns of performing an evaluation and management service by telephone and the availability of in person appointments. I also discussed with the patient that there may be a patient responsible charge related to this service. The patient expressed understanding and agreed to proceed.   History of Present Illness: Discussed the use of AI scribe software for clinical note transcription with the patient, who gave verbal consent to proceed.  History of Present Illness Cheryl Thompson is a 58 year old female  has a past medical history of Acute alcoholic hepatitis (HCC), Alcoholism (HCC), Allergy , Anemia, Anxiety, Arthritis, Bipolar disorder (HCC), Colon polyps, Concussion (11/29/2021), Depression, Diabetes mellitus without complication (HCC), Diverticulitis (2021), Diverticulosis (2021), Hepatitis C, History of hiatal hernia, HIV (human immunodeficiency virus infection) (HCC), HLD (hyperlipidemia), Hypertension, Hypothyroidism, Myocardial infarction [facility]), Neuromuscular disorder (HCC), Pneumonia, Pre-diabetes, Seizures (HCC), and Stroke (HCC) (2018). who presents with persistent symptoms following pneumonia treatment.  She was hospitalized on May 03, 2024, for shortness of breath, body aches, and headache. A chest x-ray confirmed right middle lung pneumonia, and she was treated with levofloxacin  750 mg daily for five days. Prior to this, she was on azithromycin , which was discontinued. She also completed a course of prednisone . Despite treatment, she continues to experience weakness, cough, and pain, particularly in her neck and head. Her chronic pain is exacerbated by weather  changes and worsens with coughing.  She has not been taking any cough medication recently due to cost issues with promethazine  syrup. She used some leftover Tessalon  [place].  Her past medical history includes chronic pain and hypertension. She takes ibuprofen  and a muscle relaxant for pain. She also takes duloxetine  60 mg daily for depression and chronic pain management.  For hypertension, she is on hydrochlorothiazide  25 mg daily and amlodipine  5 mg daily. She sometimes misses doses, which may contribute to elevated blood pressure readings. She usually exercises daily by walking but has been unable to do so since her illness, although she tries to stay active by walking around her house and yard.  She is disabled and has transportation issues affecting her ability to attend appointments.     Observations/Objective: Patient alert and oriented no sign of distress noted  Assessment and Plan:  Assessment & Plan Community acquired pneumonia of right upper lobe of lung Pneumonia, right middle lobe Recent diagnosis of right middle lobe pneumonia. Treated with levofloxacin  and prednisone . No repeat chest x-ray unless symptoms persist. Tessalon  Perles 200 mg 3 times daily as needed cough prescribed - Consider repeat chest x-ray if symptoms persist.   Acute cough Persistent cough post-pneumonia treatment. Previous medications completed. Not taking promethazine  due to cost, using leftover Tessalon  Perles. - Prescribe Tessalon  Perles 200mg , three times daily as needed. - Advise hydration with at least 64 ounces of water daily. - If insurance does not cover Tessalon  Perles, recommend over-the-counter Mucinex .   Cervical strain Chronic pain worsened by illness and weather. Pain in neck and head, exacerbated by coughing. Managed with ibuprofen  and muscle relaxants. - Continue ibuprofen  800 mg 3 times daily as needed as needed with food. - Continue Flexeril  5 mg at bedtime as needed - Ensure  duloxetine  60  mg daily for chronic pain and depression.   Essential hypertension, benign  Blood pressure high. Unclear medication adherence. Emphasized importance of adherence. - Ensure adherence to hydrochlorothiazide  25 mg daily and amlodipine  5 mg daily. - Advise heart-healthy, low-salt, low-fat diet. - Encourage exercise, at least 30 minutes daily. - Monitor blood pressure at home, bring readings to next appointment.  Encounter for examination following treatment at hospital Hospital chart reviewed, including discharge summary Medications reconciled and reviewed with the patient in detail      Follow Up Instructions:    I discussed the assessment and treatment plan with the patient. The patient was provided an opportunity to ask questions and all were answered. The patient agreed with the plan and demonstrated an understanding of the instructions.   The patient was advised to call back or seek an in-person evaluation if the symptoms worsen or if the condition fails to improve as anticipated.

## 2024-05-12 NOTE — Patient Instructions (Addendum)
 1. Acute cough (Primary)  - benzonatate  (TESSALON ) 200 MG capsule; Take 1 capsule (200 mg total) by mouth 3 (three) times daily as needed for cough.  Dispense: 20 capsule; Refill: 0   It is important that you exercise regularly at least 30 minutes 5 times a week as tolerated  Think about what you will eat, plan ahead. Choose  clean, green, fresh or frozen over canned, processed or packaged foods which are more sugary, salty and fatty. 70 to 75% of food eaten should be vegetables and fruit. Three meals at set times with snacks allowed between meals, but they must be fruit or vegetables. Aim to eat over a 12 hour period , example 7 am to 7 pm, and STOP after  your last meal of the day. Drink water,generally about 64 ounces per day, no other drink is as healthy. Fruit juice is best enjoyed in a healthy way, by EATING the fruit.  Thanks for choosing [facility] we consider it a privelige to serve you.

## 2024-05-12 NOTE — Assessment & Plan Note (Signed)
 Persistent cough post-pneumonia treatment. Previous medications completed. Not taking promethazine  due to cost, using leftover Tessalon  Perles. - Prescribe Tessalon  Perles 200mg , three times daily as needed. - Advise hydration with at least 64 ounces of water daily. - If insurance does not cover Tessalon  Perles, recommend over-the-counter Mucinex .

## 2024-05-12 NOTE — Assessment & Plan Note (Signed)
 Pneumonia, right middle lobe Recent diagnosis of right middle lobe pneumonia. Treated with levofloxacin  and prednisone . No repeat chest x-ray unless symptoms persist. Tessalon  Perles 200 mg 3 times daily as needed cough prescribed - Consider repeat chest x-ray if symptoms persist.

## 2024-05-12 NOTE — Assessment & Plan Note (Signed)
 Chronic pain worsened by illness and weather. Pain in neck and head, exacerbated by coughing. Managed with ibuprofen  and muscle relaxants. - Continue ibuprofen  800 mg 3 times daily as needed as needed with food. - Continue Flexeril  5 mg at bedtime as needed - Ensure duloxetine  60 mg daily for chronic pain and depression.

## 2024-05-15 ENCOUNTER — Telehealth: Payer: Self-pay | Admitting: Internal Medicine

## 2024-05-15 NOTE — Telephone Encounter (Signed)
 Pharmacy request refill for montelukast 

## 2024-05-18 NOTE — Telephone Encounter (Signed)
 Pt last ov was 12/18/22, refill request denied. LVM (05/18/24) and mychart message (04/27/24) for patient advising to schedule follow up.

## 2024-05-19 ENCOUNTER — Ambulatory Visit: Payer: Self-pay | Admitting: Nurse Practitioner

## 2024-05-27 ENCOUNTER — Other Ambulatory Visit: Payer: Self-pay | Admitting: Nurse Practitioner

## 2024-05-27 DIAGNOSIS — E039 Hypothyroidism, unspecified: Secondary | ICD-10-CM

## 2024-05-27 DIAGNOSIS — I10 Essential (primary) hypertension: Secondary | ICD-10-CM

## 2024-05-27 MED ORDER — HYDROCHLOROTHIAZIDE 25 MG PO TABS: 25.0000 mg | ORAL_TABLET | Freq: Every day | ORAL | 1 refills | Status: AC

## 2024-05-27 NOTE — Telephone Encounter (Signed)
 Copied from CRM [id number]. Topic: Clinical - Medication Refill >> May 27, 2024  9:08 AM Rosaria E wrote: Medication:  hydrochlorothiazide  (HYDRODIURIL ) 25 MG tablet levothyroxine  (SYNTHROID ) 137 MCG tablet  Has the patient contacted their pharmacy? Yes (Agent: If no, request that the patient contact the pharmacy for the refill. If patient does not wish to contact the pharmacy document the reason why and proceed with request.) (Agent: If yes, when and what did the pharmacy advise?)  This is the patient's preferred pharmacy:  [facility] - [place] [address] [place] [address] Phone: [phone] Fax: [phone]  Is this the correct pharmacy for this prescription? Yes If no, delete pharmacy and type the correct one.   Has the prescription been filled recently? Yes  Is the patient out of the medication? No  Has the patient been seen for an appointment in the last year OR does the patient have an upcoming appointment? Yes  Can we respond through MyChart? Yes  Agent: Please be advised that Rx refills may take up to 3 business days. We ask that you follow-up with your pharmacy.

## 2024-05-29 ENCOUNTER — Other Ambulatory Visit: Payer: Self-pay

## 2024-05-30 ENCOUNTER — Other Ambulatory Visit (HOSPITAL_COMMUNITY): Payer: Self-pay | Admitting: Psychiatry

## 2024-05-30 ENCOUNTER — Other Ambulatory Visit: Payer: Self-pay | Admitting: Nurse Practitioner

## 2024-05-30 DIAGNOSIS — F332 Major depressive disorder, recurrent severe without psychotic features: Secondary | ICD-10-CM

## 2024-05-30 DIAGNOSIS — E039 Hypothyroidism, unspecified: Secondary | ICD-10-CM

## 2024-05-30 DIAGNOSIS — G629 Polyneuropathy, unspecified: Secondary | ICD-10-CM

## 2024-06-02 ENCOUNTER — Other Ambulatory Visit: Payer: Self-pay | Admitting: Sports Medicine

## 2024-06-08 NOTE — Progress Notes (Signed)
 The ASCVD Risk score (Arnett DK, et al., 2019) failed to calculate for the following reasons:   Risk score cannot be calculated because patient has a medical history suggesting prior/existing ASCVD  Arlon Bergamo, BSN, RN

## 2024-06-09 ENCOUNTER — Ambulatory Visit: Payer: Self-pay | Admitting: Nurse Practitioner

## 2024-06-09 ENCOUNTER — Ambulatory Visit: Admitting: Nurse Practitioner

## 2024-06-22 ENCOUNTER — Other Ambulatory Visit: Payer: Self-pay | Admitting: Nurse Practitioner

## 2024-06-22 DIAGNOSIS — E039 Hypothyroidism, unspecified: Secondary | ICD-10-CM

## 2024-06-23 ENCOUNTER — Ambulatory Visit: Admitting: Nurse Practitioner

## 2024-07-02 ENCOUNTER — Ambulatory Visit: Admitting: Infectious Diseases

## 2024-07-02 ENCOUNTER — Other Ambulatory Visit (HOSPITAL_COMMUNITY)
Admission: RE | Admit: 2024-07-02 | Discharge: 2024-07-02 | Disposition: A | Source: Ambulatory Visit | Attending: Infectious Diseases | Admitting: Infectious Diseases

## 2024-07-02 ENCOUNTER — Encounter: Payer: Self-pay | Admitting: Infectious Diseases

## 2024-07-02 ENCOUNTER — Other Ambulatory Visit: Payer: Self-pay

## 2024-07-02 DIAGNOSIS — Z8619 Personal history of other infectious and parasitic diseases: Secondary | ICD-10-CM

## 2024-07-02 DIAGNOSIS — Z113 Encounter for screening for infections with a predominantly sexual mode of transmission: Secondary | ICD-10-CM | POA: Insufficient documentation

## 2024-07-02 DIAGNOSIS — K7030 Alcoholic cirrhosis of liver without ascites: Secondary | ICD-10-CM | POA: Diagnosis not present

## 2024-07-02 DIAGNOSIS — B20 Human immunodeficiency virus [HIV] disease: Secondary | ICD-10-CM

## 2024-07-02 DIAGNOSIS — Z5181 Encounter for therapeutic drug level monitoring: Secondary | ICD-10-CM

## 2024-07-02 DIAGNOSIS — K7460 Unspecified cirrhosis of liver: Secondary | ICD-10-CM

## 2024-07-02 DIAGNOSIS — Z23 Encounter for immunization: Secondary | ICD-10-CM | POA: Diagnosis not present

## 2024-07-02 DIAGNOSIS — Z0000 Encounter for general adult medical examination without abnormal findings: Secondary | ICD-10-CM

## 2024-07-02 DIAGNOSIS — F1091 Alcohol use, unspecified, in remission: Secondary | ICD-10-CM

## 2024-07-02 DIAGNOSIS — Z21 Asymptomatic human immunodeficiency virus [HIV] infection status: Secondary | ICD-10-CM

## 2024-07-02 MED ORDER — BIKTARVY 50-200-25 MG PO TABS: 1.0000 | ORAL_TABLET | Freq: Every day | ORAL | 1 refills | Status: AC
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
## 2024-07-03 LAB — URINE CYTOLOGY ANCILLARY ONLY
Chlamydia: NEGATIVE
Comment: NEGATIVE
Comment: NORMAL
Neisseria Gonorrhea: NEGATIVE

## 2024-07-06 ENCOUNTER — Ambulatory Visit: Payer: Self-pay | Admitting: Infectious Diseases

## 2024-07-06 ENCOUNTER — Other Ambulatory Visit: Payer: Self-pay

## 2024-07-06 DIAGNOSIS — F419 Anxiety disorder, unspecified: Secondary | ICD-10-CM

## 2024-07-06 LAB — COMPREHENSIVE METABOLIC PANEL WITH GFR
AG Ratio: 1 (calc) (ref 1.0–2.5)
ALT: 14 U/L (ref 6–29)
AST: 28 U/L (ref 10–35)
Albumin: 4.3 g/dL (ref 3.6–5.1)
Alkaline phosphatase (APISO): 79 U/L (ref 37–153)
BUN/Creatinine Ratio: 12 (calc) (ref 6–22)
BUN: 12 mg/dL (ref 7–25)
CO2: 30 mmol/L (ref 20–32)
Calcium: 9.4 mg/dL (ref 8.6–10.4)
Chloride: 96 mmol/L — ABNORMAL LOW (ref 98–110)
Creat: 1.04 mg/dL — ABNORMAL HIGH (ref 0.50–1.03)
Globulin: 4.2 g/dL — ABNORMAL HIGH (ref 1.9–3.7)
Glucose, Bld: 87 mg/dL (ref 65–99)
Potassium: 3.7 mmol/L (ref 3.5–5.3)
Sodium: 133 mmol/L — ABNORMAL LOW (ref 135–146)
Total Bilirubin: 0.3 mg/dL (ref 0.2–1.2)
Total Protein: 8.5 g/dL — ABNORMAL HIGH (ref 6.1–8.1)
eGFR: 62 mL/min/1.73m2

## 2024-07-06 LAB — LIPID PANEL
Cholesterol: 224 mg/dL — ABNORMAL HIGH
HDL: 25 mg/dL — ABNORMAL LOW
Non-HDL Cholesterol (Calc): 199 mg/dL — ABNORMAL HIGH
Total CHOL/HDL Ratio: 9 (calc) — ABNORMAL HIGH
Triglycerides: 815 mg/dL — ABNORMAL HIGH

## 2024-07-06 LAB — TEST AUTHORIZATION: TEST NAME:: 7600

## 2024-07-06 LAB — HIV RNA, RTPCR W/R GT (RTI, PI,INT)
HIV 1 RNA Quant: NOT DETECTED {copies}/mL
HIV-1 RNA Quant, Log: NOT DETECTED {Log_copies}/mL

## 2024-07-06 LAB — SYPHILIS: RPR W/REFLEX TO RPR TITER AND TREPONEMAL ANTIBODIES, TRADITIONAL SCREENING AND DIAGNOSIS ALGORITHM: RPR Ser Ql: NONREACTIVE

## 2024-07-06 MED ORDER — HYDROXYZINE HCL 25 MG PO TABS: 25.0000 mg | ORAL_TABLET | Freq: Every day | ORAL | 0 refills | Status: AC | PRN

## 2024-07-06 NOTE — Telephone Encounter (Signed)
 Please Advise

## 2024-07-07 ENCOUNTER — Ambulatory Visit (HOSPITAL_COMMUNITY)

## 2024-07-07 DIAGNOSIS — E1165 Type 2 diabetes mellitus with hyperglycemia: Secondary | ICD-10-CM

## 2024-07-07 NOTE — Telephone Encounter (Signed)
 Pt has been advised that prescription was sent.

## 2024-07-20 ENCOUNTER — Ambulatory Visit (HOSPITAL_COMMUNITY)
Admission: RE | Admit: 2024-07-20 | Discharge: 2024-07-20 | Disposition: A | Discharge status: Home / Self Care | Source: Ambulatory Visit | Attending: Infectious Diseases | Admitting: Infectious Diseases

## 2024-07-20 DIAGNOSIS — K7460 Unspecified cirrhosis of liver: Secondary | ICD-10-CM | POA: Insufficient documentation

## 2024-07-21 ENCOUNTER — Ambulatory Visit: Admitting: Nurse Practitioner

## 2024-07-21 ENCOUNTER — Telehealth: Payer: Self-pay

## 2024-07-21 NOTE — Telephone Encounter (Signed)
 Patient called concerned about abdomen US  results. Requesting to speak with provider.   Call back is [phone]   Annabella SHAUNNA Letters, CMA

## 2024-07-22 ENCOUNTER — Other Ambulatory Visit: Payer: Self-pay | Admitting: Nurse Practitioner

## 2024-07-22 DIAGNOSIS — E039 Hypothyroidism, unspecified: Secondary | ICD-10-CM

## 2024-07-22 NOTE — Telephone Encounter (Signed)
 levothyroxine  (SYNTHROID ) 112 MCG tablet [Pharmacy Med Name: levothyroxine  112 mcg tablet]      The original prescription was discontinued on 10/21/2023 by Paseda, Folashade R, FNP. Renewing this prescription may not be appropriate.

## 2024-08-06 ENCOUNTER — Encounter (HOSPITAL_COMMUNITY): Payer: Self-pay

## 2024-08-06 ENCOUNTER — Telehealth (HOSPITAL_COMMUNITY): Admitting: Psychiatry

## 2024-08-10 ENCOUNTER — Telehealth (HOSPITAL_COMMUNITY): Admitting: Psychiatry

## 2024-08-10 ENCOUNTER — Encounter (HOSPITAL_COMMUNITY): Payer: Self-pay | Admitting: Psychiatry

## 2024-08-10 DIAGNOSIS — F319 Bipolar disorder, unspecified: Secondary | ICD-10-CM

## 2024-08-10 DIAGNOSIS — F4310 Post-traumatic stress disorder, unspecified: Secondary | ICD-10-CM | POA: Diagnosis not present

## 2024-08-10 DIAGNOSIS — F1090 Alcohol use, unspecified, uncomplicated: Secondary | ICD-10-CM | POA: Diagnosis not present

## 2024-08-10 DIAGNOSIS — F411 Generalized anxiety disorder: Secondary | ICD-10-CM

## 2024-08-10 DIAGNOSIS — F3163 Bipolar disorder, current episode mixed, severe, without psychotic features: Secondary | ICD-10-CM

## 2024-08-10 DIAGNOSIS — G629 Polyneuropathy, unspecified: Secondary | ICD-10-CM

## 2024-08-10 DIAGNOSIS — F332 Major depressive disorder, recurrent severe without psychotic features: Secondary | ICD-10-CM

## 2024-08-10 MED ORDER — DULOXETINE HCL 60 MG PO CPEP: 60.0000 mg | ORAL_CAPSULE | Freq: Every day | ORAL | 0 refills | Status: AC
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
## 2024-11-11 ENCOUNTER — Telehealth (HOSPITAL_COMMUNITY): Admitting: Psychiatry

## 2024-11-24 ENCOUNTER — Ambulatory Visit: Payer: Self-pay | Admitting: Infectious Diseases
